# Patient Record
Sex: Female | Born: 1945 | Race: White | Hispanic: No | Marital: Married | Smoking: Never smoker
Health system: Southern US, Community
[De-identification: ages and names within clinical notes are randomized; demographics above are authoritative.]

## PROBLEM LIST (undated history)

## (undated) DIAGNOSIS — I509 Heart failure, unspecified: Secondary | ICD-10-CM

## (undated) DIAGNOSIS — I2510 Atherosclerotic heart disease of native coronary artery without angina pectoris: Secondary | ICD-10-CM

## (undated) DIAGNOSIS — E119 Type 2 diabetes mellitus without complications: Secondary | ICD-10-CM

## (undated) DIAGNOSIS — R011 Cardiac murmur, unspecified: Secondary | ICD-10-CM

## (undated) DIAGNOSIS — I10 Essential (primary) hypertension: Secondary | ICD-10-CM

## (undated) DIAGNOSIS — I499 Cardiac arrhythmia, unspecified: Secondary | ICD-10-CM

## (undated) DIAGNOSIS — J449 Chronic obstructive pulmonary disease, unspecified: Secondary | ICD-10-CM

## (undated) DIAGNOSIS — M1990 Unspecified osteoarthritis, unspecified site: Secondary | ICD-10-CM

## (undated) DIAGNOSIS — E785 Hyperlipidemia, unspecified: Secondary | ICD-10-CM

## (undated) DIAGNOSIS — J189 Pneumonia, unspecified organism: Secondary | ICD-10-CM

## (undated) DIAGNOSIS — I5189 Other ill-defined heart diseases: Secondary | ICD-10-CM

## (undated) HISTORY — DX: Essential (primary) hypertension: I10

## (undated) HISTORY — DX: Atherosclerotic heart disease of native coronary artery without angina pectoris: I25.10

## (undated) HISTORY — DX: Other ill-defined heart diseases: I51.89

## (undated) HISTORY — DX: Type 2 diabetes mellitus without complications: E11.9

## (undated) HISTORY — DX: Hyperlipidemia, unspecified: E78.5

---

## 2001-12-11 ENCOUNTER — Encounter: Payer: Self-pay | Admitting: Family Medicine

## 2001-12-11 ENCOUNTER — Encounter: Admission: RE | Admit: 2001-12-11 | Discharge: 2001-12-11 | Payer: Self-pay | Admitting: Family Medicine

## 2003-02-07 ENCOUNTER — Encounter: Payer: Self-pay | Admitting: Family Medicine

## 2003-02-07 ENCOUNTER — Encounter: Admission: RE | Admit: 2003-02-07 | Discharge: 2003-02-07 | Payer: Self-pay | Admitting: Family Medicine

## 2003-03-05 ENCOUNTER — Encounter (INDEPENDENT_AMBULATORY_CARE_PROVIDER_SITE_OTHER): Payer: Self-pay | Admitting: Specialist

## 2003-03-05 ENCOUNTER — Ambulatory Visit (HOSPITAL_COMMUNITY): Admission: RE | Admit: 2003-03-05 | Discharge: 2003-03-05 | Payer: Self-pay | Admitting: Gastroenterology

## 2004-10-20 ENCOUNTER — Encounter: Admission: RE | Admit: 2004-10-20 | Discharge: 2004-10-20 | Payer: Self-pay | Admitting: Family Medicine

## 2006-01-25 ENCOUNTER — Encounter: Admission: RE | Admit: 2006-01-25 | Discharge: 2006-01-25 | Payer: Self-pay | Admitting: Family Medicine

## 2007-05-22 ENCOUNTER — Encounter: Admission: RE | Admit: 2007-05-22 | Discharge: 2007-05-22 | Payer: Self-pay | Admitting: Pediatrics

## 2008-08-04 ENCOUNTER — Encounter: Admission: RE | Admit: 2008-08-04 | Discharge: 2008-08-04 | Payer: Self-pay | Admitting: Family Medicine

## 2009-08-25 ENCOUNTER — Encounter: Admission: RE | Admit: 2009-08-25 | Discharge: 2009-08-25 | Payer: Self-pay | Admitting: Family Medicine

## 2010-05-13 ENCOUNTER — Inpatient Hospital Stay (HOSPITAL_COMMUNITY): Admission: RE | Admit: 2010-05-13 | Discharge: 2010-05-18 | Payer: Self-pay | Admitting: Cardiovascular Disease

## 2010-05-14 HISTORY — PX: PERCUTANEOUS CORONARY STENT INTERVENTION (PCI-S): SHX6016

## 2010-05-17 HISTORY — PX: PERCUTANEOUS CORONARY STENT INTERVENTION (PCI-S): SHX6016

## 2010-06-03 ENCOUNTER — Encounter (HOSPITAL_COMMUNITY): Admission: RE | Admit: 2010-06-03 | Discharge: 2010-07-16 | Payer: Self-pay | Admitting: Cardiovascular Disease

## 2010-07-17 ENCOUNTER — Encounter (HOSPITAL_COMMUNITY): Admission: RE | Admit: 2010-07-17 | Discharge: 2010-08-30 | Payer: Self-pay | Admitting: Cardiovascular Disease

## 2010-08-27 ENCOUNTER — Encounter: Admission: RE | Admit: 2010-08-27 | Discharge: 2010-08-27 | Payer: Self-pay | Admitting: Family Medicine

## 2010-12-30 LAB — GLUCOSE, CAPILLARY
Glucose-Capillary: 114 mg/dL — ABNORMAL HIGH (ref 70–99)
Glucose-Capillary: 120 mg/dL — ABNORMAL HIGH (ref 70–99)
Glucose-Capillary: 103 mg/dL — ABNORMAL HIGH (ref 70–99)
Glucose-Capillary: 99 mg/dL (ref 70–99)

## 2010-12-31 LAB — BASIC METABOLIC PANEL
BUN: 12 mg/dL (ref 6–23)
CO2: 27 mEq/L (ref 19–32)
Calcium: 8.6 mg/dL (ref 8.4–10.5)
Chloride: 101 mEq/L (ref 96–112)
GFR calc Af Amer: >60 mL/min (ref >60)
GFR calc Af Amer: >60 mL/min (ref >60)
Potassium: 4.3 mEq/L (ref 3.5–5.1)
Sodium: 136 mEq/L (ref 135–145)
Sodium: 139 mEq/L (ref 135–145)

## 2010-12-31 LAB — CBC
Hemoglobin: 12.4 g/dL (ref 12.0–15.0)
MCHC: 33.2 g/dL (ref 30.0–36.0)
MCHC: 34.2 g/dL (ref 30.0–36.0)
MCV: 92.3 fL (ref 78.0–100.0)
Platelets: 192 10*3/uL (ref 150–400)
RBC: 4.09 MIL/uL (ref 3.87–5.11)
RBC: 4.3 MIL/uL (ref 3.87–5.11)
WBC: 5.8 10*3/uL (ref 4.0–10.5)
WBC: 6.4 10*3/uL (ref 4.0–10.5)

## 2010-12-31 LAB — GLUCOSE, CAPILLARY
Glucose-Capillary: 118 mg/dL — ABNORMAL HIGH (ref 70–99)
Glucose-Capillary: 130 mg/dL — ABNORMAL HIGH (ref 70–99)
Glucose-Capillary: 130 mg/dL — ABNORMAL HIGH (ref 70–99)
Glucose-Capillary: 130 mg/dL — ABNORMAL HIGH (ref 70–99)
Glucose-Capillary: 87 mg/dL (ref 70–99)
Glucose-Capillary: 101 mg/dL — ABNORMAL HIGH (ref 70–99)
Glucose-Capillary: 165 mg/dL — ABNORMAL HIGH (ref 70–99)

## 2010-12-31 LAB — PROTIME-INR: INR: 0.89 (ref 0.00–1.49)

## 2010-12-31 LAB — APTT: aPTT: 38 seconds — ABNORMAL HIGH (ref 24–37)

## 2011-01-01 LAB — CBC
HCT: 39.5 % (ref 36.0–46.0)
HCT: 42.1 % (ref 36.0–46.0)
Hemoglobin: 12.9 g/dL (ref 12.0–15.0)
Hemoglobin: 13.5 g/dL (ref 12.0–15.0)
Hemoglobin: 14.6 g/dL (ref 12.0–15.0)
MCH: 31.5 pg (ref 26.0–34.0)
MCH: 31.8 pg (ref 26.0–34.0)
MCHC: 34 g/dL (ref 30.0–36.0)
MCHC: 34.1 g/dL (ref 30.0–36.0)
MCV: 92.3 fL (ref 78.0–100.0)
MCV: 92.7 fL (ref 78.0–100.0)
Platelets: 177 10*3/uL (ref 150–400)
RBC: 4.11 MIL/uL (ref 3.87–5.11)
RBC: 4.6 MIL/uL (ref 3.87–5.11)
RDW: 13.3 % (ref 11.5–15.5)
RDW: 13.3 % (ref 11.5–15.5)
WBC: 6.7 10*3/uL (ref 4.0–10.5)

## 2011-01-01 LAB — BASIC METABOLIC PANEL
BUN: 9 mg/dL (ref 6–23)
CO2: 21 mEq/L (ref 19–32)
CO2: 27 mEq/L (ref 19–32)
CO2: 30 mEq/L (ref 19–32)
Calcium: 8.7 mg/dL (ref 8.4–10.5)
Calcium: 9.3 mg/dL (ref 8.4–10.5)
GFR calc Af Amer: >60 mL/min (ref >60)
GFR calc Af Amer: >60 mL/min (ref >60)
GFR calc non Af Amer: >60 mL/min (ref >60)
Glucose, Bld: 161 mg/dL — ABNORMAL HIGH (ref 70–99)
Glucose, Bld: 172 mg/dL — ABNORMAL HIGH (ref 70–99)
Potassium: 4 mEq/L (ref 3.5–5.1)
Potassium: 4 mEq/L (ref 3.5–5.1)
Potassium: 4.2 mEq/L (ref 3.5–5.1)
Sodium: 139 mEq/L (ref 135–145)
Sodium: 139 mEq/L (ref 135–145)
Sodium: 139 mEq/L (ref 135–145)

## 2011-01-01 LAB — CARDIAC PANEL(CRET KIN+CKTOT+MB+TROPI): Troponin I: 0.02 ng/mL (ref 0.00–0.06)

## 2011-01-01 LAB — GLUCOSE, CAPILLARY
Glucose-Capillary: 125 mg/dL — ABNORMAL HIGH (ref 70–99)
Glucose-Capillary: 127 mg/dL — ABNORMAL HIGH (ref 70–99)
Glucose-Capillary: 150 mg/dL — ABNORMAL HIGH (ref 70–99)
Glucose-Capillary: 152 mg/dL — ABNORMAL HIGH (ref 70–99)
Glucose-Capillary: 176 mg/dL — ABNORMAL HIGH (ref 70–99)
Glucose-Capillary: 189 mg/dL — ABNORMAL HIGH (ref 70–99)
Glucose-Capillary: 229 mg/dL — ABNORMAL HIGH (ref 70–99)
Glucose-Capillary: 235 mg/dL — ABNORMAL HIGH (ref 70–99)
Glucose-Capillary: 238 mg/dL — ABNORMAL HIGH (ref 70–99)

## 2011-01-01 LAB — LIPID PANEL
LDL Cholesterol: 71 mg/dL (ref 0–99)
Triglycerides: 204 mg/dL — ABNORMAL HIGH (ref <150)

## 2011-01-01 LAB — COMPREHENSIVE METABOLIC PANEL
Alkaline Phosphatase: 49 U/L (ref 39–117)
BUN: 9 mg/dL (ref 6–23)
Calcium: 9 mg/dL (ref 8.4–10.5)
Glucose, Bld: 154 mg/dL — ABNORMAL HIGH (ref 70–99)
Total Protein: 7.6 g/dL (ref 6.0–8.3)

## 2011-01-01 LAB — PROTIME-INR
INR: 0.81 (ref 0.00–1.49)
Prothrombin Time: 11.1 seconds — ABNORMAL LOW (ref 11.6–15.2)

## 2011-01-01 LAB — MRSA PCR SCREENING: MRSA by PCR: NEGATIVE

## 2011-01-01 LAB — HEMOGLOBIN A1C
Hgb A1c MFr Bld: 8.8 % — ABNORMAL HIGH (ref <5.7)
Mean Plasma Glucose: 206 mg/dL — ABNORMAL HIGH (ref <117)

## 2011-01-01 LAB — APTT: aPTT: 32 seconds (ref 24–37)

## 2011-03-04 NOTE — Op Note (Signed)
   NAMETRINA, Diana [name]                            ACCOUNT NO.:  [id number]   MEDICAL RECORD NO.:  [id number]                   PATIENT TYPE:  AMB   LOCATION:  ENDO                                 FACILITY:  [facility]   PHYSICIAN:  Petra Kuba, M.D.                 DATE OF BIRTH:  1946-07-21   DATE OF PROCEDURE:  03/05/2003  DATE OF DISCHARGE:                                 OPERATIVE REPORT   PROCEDURE PERFORMED:  Colonoscopy with biopsy.   ENDOSCOPIST:  Petra Kuba, M.D.   INDICATIONS FOR PROCEDURE:  Screening.  Consent was signed after the risks,  benefits, methods and options were thoroughly discussed in the office.   MEDICINES USED:  Demerol 70 mg, Versed 7.5 mg.   DESCRIPTION OF PROCEDURE:  Rectal inspection was pertinent for external  hemorrhoids, small.  Digital exam was negative.  [name] video pediatric  adjustable colonoscope was inserted and easily advanced around the colon to  the cecum.  This did not require any abdominal pressure or any position  changes.  The cecum was identified by the appendiceal orifice and the  ileocecal valve.  No obvious abnormality was seen on insertion.  The cecum  was identified by the appendiceal orifice and the ileocecal valve.  The  scope was slowly withdrawn.  The prep was adequate.  There was some liquid  stool that required washing and suctioning.  On slow withdrawal through the  colon, no abnormalities were seen as we slowly withdrew back to the rectum  except for in the distal sigmoid [name] tiny probable hyperplastic appearing  polyp was seen and was cold biopsied times one.  Once back  in the rectum  anorectal pullthrough and retroflexion confirmed some small hemorrhoids.  Scope was straightened, readvanced [name] short ways up the left side of the  colon.  Air was suctioned, scope removed.  The patient tolerated the  procedure well.  There was no immediate obvious complication.   ENDOSCOPIC DIAGNOSIS:  1. Internal and external hemorrhoids.  2.  Tiny distal sigmoid polyp cold biopsied.  3. Otherwise within normal limits to the cecum.   PLAN:  Await pathology but probably recheck colon screening in five years.  Happy to see back as needed.  Otherwise return care to Ms. Toni Arthurs for the  customary health care maintenance to include yearly rectals and guaiacs.                                               Petra Kuba, M.D.    MEM/MEDQ  D:  03/05/2003  Diana:  03/06/2003  Job:  [id number]   cc:   Theora Gianotti [facility]

## 2011-09-23 ENCOUNTER — Other Ambulatory Visit: Payer: Self-pay | Admitting: Family Medicine

## 2011-10-06 ENCOUNTER — Other Ambulatory Visit: Payer: Self-pay | Admitting: Family Medicine

## 2011-10-17 ENCOUNTER — Other Ambulatory Visit: Payer: Self-pay | Admitting: Family Medicine

## 2011-10-17 DIAGNOSIS — N61 Mastitis without abscess: Secondary | ICD-10-CM

## 2011-10-21 ENCOUNTER — Ambulatory Visit
Admission: RE | Admit: 2011-10-21 | Discharge: 2011-10-21 | Disposition: A | Discharge status: Home / Self Care | Payer: BC Managed Care – PPO | Source: Ambulatory Visit | Attending: Family Medicine | Admitting: Family Medicine

## 2011-10-21 DIAGNOSIS — N61 Mastitis without abscess: Secondary | ICD-10-CM

## 2012-11-09 ENCOUNTER — Other Ambulatory Visit: Payer: Self-pay | Admitting: Family Medicine

## 2012-11-09 DIAGNOSIS — Z1231 Encounter for screening mammogram for malignant neoplasm of breast: Secondary | ICD-10-CM

## 2012-12-13 ENCOUNTER — Ambulatory Visit
Admission: RE | Admit: 2012-12-13 | Discharge: 2012-12-13 | Disposition: A | Discharge status: Home / Self Care | Payer: BC Managed Care – PPO | Source: Ambulatory Visit | Attending: Family Medicine | Admitting: Family Medicine

## 2012-12-13 DIAGNOSIS — Z1231 Encounter for screening mammogram for malignant neoplasm of breast: Secondary | ICD-10-CM

## 2012-12-14 ENCOUNTER — Other Ambulatory Visit (HOSPITAL_COMMUNITY): Payer: Self-pay | Admitting: Cardiovascular Disease

## 2012-12-14 DIAGNOSIS — I119 Hypertensive heart disease without heart failure: Secondary | ICD-10-CM

## 2012-12-14 DIAGNOSIS — I2510 Atherosclerotic heart disease of native coronary artery without angina pectoris: Secondary | ICD-10-CM

## 2012-12-15 DIAGNOSIS — I5189 Other ill-defined heart diseases: Secondary | ICD-10-CM

## 2012-12-15 HISTORY — DX: Other ill-defined heart diseases: I51.89

## 2013-01-01 ENCOUNTER — Encounter (HOSPITAL_COMMUNITY): Payer: BC Managed Care – PPO

## 2013-01-01 ENCOUNTER — Ambulatory Visit (HOSPITAL_COMMUNITY)
Admission: RE | Admit: 2013-01-01 | Discharge: 2013-01-01 | Disposition: A | Discharge status: Home / Self Care | Payer: BC Managed Care – PPO | Source: Ambulatory Visit | Attending: Cardiovascular Disease | Admitting: Cardiovascular Disease

## 2013-01-01 DIAGNOSIS — I2510 Atherosclerotic heart disease of native coronary artery without angina pectoris: Secondary | ICD-10-CM | POA: Insufficient documentation

## 2013-01-01 NOTE — Progress Notes (Signed)
[facility]   2D echo completed 01/01/2013.   Cindy Jacquise Rarick, RDCS  

## 2013-05-14 ENCOUNTER — Other Ambulatory Visit: Payer: Self-pay | Admitting: *Deleted

## 2013-05-14 MED ORDER — LOSARTAN POTASSIUM-HCTZ 50-12.5 MG PO TABS: 1.0000 | ORAL_TABLET | Freq: Every day | ORAL | Status: DC

## 2013-05-14 NOTE — Telephone Encounter (Signed)
Rx was sent to pharmacy electronically. 

## 2013-05-17 ENCOUNTER — Other Ambulatory Visit: Payer: Self-pay | Admitting: Cardiovascular Disease

## 2013-05-17 LAB — COMPREHENSIVE METABOLIC PANEL
ALT: 28 U/L (ref 0–35)
AST: 14 U/L (ref 0–37)
Alkaline Phosphatase: 43 U/L (ref 39–117)
Glucose, Bld: 173 mg/dL — ABNORMAL HIGH (ref 70–99)
Sodium: 137 mEq/L (ref 135–145)
Total Bilirubin: 0.4 mg/dL (ref 0.3–1.2)
Total Protein: 7 g/dL (ref 6.0–8.3)

## 2013-05-17 LAB — NMR LIPOPROFILE WITH LIPIDS
Cholesterol, Total: 150 mg/dL (ref <200)
HDL Size: 8.6 nm — ABNORMAL LOW (ref >=9.2)
HDL-C: 44 mg/dL (ref >=40)
Large HDL-P: 3.5 umol/L — ABNORMAL LOW (ref >=4.8)
Triglycerides: 180 mg/dL — ABNORMAL HIGH (ref <150)

## 2013-05-17 LAB — HEMOGLOBIN A1C: Mean Plasma Glucose: 203 mg/dL — ABNORMAL HIGH (ref <117)

## 2013-05-22 ENCOUNTER — Encounter: Payer: Self-pay | Admitting: *Deleted

## 2013-05-24 ENCOUNTER — Ambulatory Visit (INDEPENDENT_AMBULATORY_CARE_PROVIDER_SITE_OTHER): Payer: BC Managed Care – PPO | Admitting: Cardiovascular Disease

## 2013-05-24 ENCOUNTER — Encounter: Payer: Self-pay | Admitting: Cardiovascular Disease

## 2013-05-24 VITALS — BP 150/70 | HR 73 | Ht 64.0 in | Wt 204.4 lb

## 2013-05-24 DIAGNOSIS — I2510 Atherosclerotic heart disease of native coronary artery without angina pectoris: Secondary | ICD-10-CM

## 2013-05-24 DIAGNOSIS — E119 Type 2 diabetes mellitus without complications: Secondary | ICD-10-CM

## 2013-05-24 DIAGNOSIS — I10 Essential (primary) hypertension: Secondary | ICD-10-CM | POA: Insufficient documentation

## 2013-05-24 DIAGNOSIS — E669 Obesity, unspecified: Secondary | ICD-10-CM

## 2013-05-24 MED ORDER — EZETIMIBE 10 MG PO TABS: 10.0000 mg | ORAL_TABLET | Freq: Every day | ORAL | Status: DC

## 2013-05-24 NOTE — Progress Notes (Signed)
Patient ID: Diana [name], female   DOB: [birth year], 67 y.o.   MRN: [id number]     HPI: Diana [name], is a 67 y.o. female presents to the office today for her 4 month cardiology follow up evaluation.  Diana [name] is now 67 years old. In July/August 2011 she underwent staged intervention with PTCA and stenting of a 95% ostial LAD stenosis (3.5x12 mm promiscuity a stent) postdilated 3.75 mm, end-stage intervention to) artery with tandem 3.0x28 and 2.5x23 mm promiscuity. She has a history of hyperlipidemia. While on Crestor she did develop mild transaminase elevation leading to its discontinuance. She had been given a prescription presented but never did fill this. She also has a history of diabetes mellitus which has been poorly controlled, GERD., A remote history of pneumonia. An echo Doppler study in March 2014 showed normal systolic function with grade 1 diastolic dysfunction. She did have mild aortic sclerosis with mild MR and mild LA dilation. I scheduled her for a two-year followup nuclear perfusion study but insurance I denied this. Laboratory in April 2014 showed a glucose of 169. At that time, her cholesterol was 166 LDL 81 triglycerides were 782 and hemoglobin A1c was 9.0. Recommended more aggressive control of her diabetes with her primary physician. She apparently has not had any medication adjustment as of yet. I did send her for subsequent blood work including an NMR evaluation which showed an LDL cholesterol of 70 but due to her increased small LDL particles and 1086, LDL particle number was still significantly increased at 1383. HDL was 44 triglycerides are still elevated at 180. Recent hemoglobin A1c was 87.  [name] denies any significant weight loss. At times she does note some mild shortness of breath. She denies chest pain. She presents for evaluation  No past medical history on file.  No past surgical history on file.  Allergies  Allergen Reactions  . Lisinopril     Cough  .  Sulfa Antibiotics Hives    Current Outpatient Prescriptions  Medication Sig Dispense Refill  . acarbose (PRECOSE) 50 MG tablet Take 1 tablet by mouth 2 (two) times daily.       . B Complex-C (SUPER B COMPLEX) TABS Take 1 tablet by mouth daily.      . Biotin 5000 MCG CAPS Take 1 capsule by mouth daily.      . Calcium-Magnesium-Vitamin D (CALCIUM 500 PO) Take 1 tablet by mouth daily.      . carvedilol (COREG) 12.5 MG tablet Take 1 tablet by mouth 2 (two) times daily.      . Cholecalciferol (VITAMIN D-3) 1000 UNITS CAPS Take 1 capsule by mouth daily.      . clopidogrel (PLAVIX) 75 MG tablet Take 1 tablet by mouth daily.      . Cranberry-Cholecalciferol (SUPER CRANBERRY/VITAMIN D3) 4200-500 MG-UNIT CAPS Take 1 tablet by mouth 2 (two) times daily.      . fish oil-omega-3 fatty acids 1000 MG capsule Take 1 g by mouth daily.      Marland Kitchen glimepiride (AMARYL) 2 MG tablet Take 2 mg by mouth daily before breakfast.      . LANTUS SOLOSTAR 100 UNIT/ML SOPN Inject 28 mLs into the skin daily.      Marland Kitchen losartan-hydrochlorothiazide (HYZAAR) 50-12.5 MG per tablet Take 1 tablet by mouth daily.  30 tablet  11  . metFORMIN (GLUCOPHAGE) 1000 MG tablet Take 1 tablet by mouth 2 (two) times daily.      Marland Kitchen OVER THE COUNTER MEDICATION Take 6.25  mg by mouth daily. Tri-Iodine      . pantoprazole (PROTONIX) 40 MG tablet Take 1 tablet by mouth daily.       No current facility-administered medications for this visit.    History   Social History  . Marital Status: Married    Spouse Name: N/A    Number of Children: N/A  . Years of Education: N/A   Occupational History  . Not on file.   Social History Main Topics  . Smoking status: Never Smoker   . Smokeless tobacco: Never Used  . Alcohol Use: Yes     Comment: Occasionally-very rare  . Drug Use: Not on file  . Sexually Active: Not on file   Other Topics Concern  . Not on file   Social History Narrative  . No narrative on file      ROS is negative for  fevers, chills or night sweats. There is no weight loss. She denies palpitations of the there is a mild shortness of breath. She denies chest pressure. She denies bleeding. She denies recent myalgias. She denies arthralgias. There is no significant edema.   Other system review is negative.  PE BP 150/70  Pulse 73  Ht 5\' 4"  (1.626 m)  Wt 204 lb 6.4 oz (92.715 kg)  BMI 35.07 kg/m2  General: Alert, oriented, no distress.  Skin: normal turgor, no rashes HEENT: Normocephalic, atraumatic. Pupils round and reactive; sclera anicteric;no lid lag.  Nose without nasal septal hypertrophy Mouth/Parynx benign; Mallinpatti scale 3 Neck: No JVD, no carotid briuts Lungs: clear to ausculatation and percussion; no wheezing or rales Heart: RRR, s1 s2 normal 1/6 systolic murmur Abdomen: soft, nontender; no hepatosplenomehaly, BS+; abdominal aorta nontender and not dilated by palpation. Pulses 2+ Extremities: no clubbing cyanosis or edema, Homan's sign negative  Neurologic: grossly nonfocal  ECG: Sinus with more pronounced T wave inversion V1 through V3  LABS:  BMET    Component Value Date/Time   NA 137 05/17/2013 0921   K 4.3 05/17/2013 0921   CL 101 05/17/2013 0921   CO2 28 05/17/2013 0921   GLUCOSE 173* 05/17/2013 0921   BUN 15 05/17/2013 0921   CREATININE 0.77 05/17/2013 0921   CREATININE 0.74 05/18/2010 0550   CALCIUM 9.1 05/17/2013 0921   GFRNONAA >60 05/18/2010 0550   GFRAA  Value: >60        The eGFR has been calculated using the MDRD equation. This calculation has not been validated in all clinical situations. eGFR's persistently <60 mL/min signify possible Chronic Kidney Disease. 05/18/2010 0550     Hepatic Function Panel     Component Value Date/Time   PROT 7.0 05/17/2013 0921   ALBUMIN 4.3 05/17/2013 0921   AST 14 05/17/2013 0921   ALT 28 05/17/2013 0921   ALKPHOS 43 05/17/2013 0921   BILITOT 0.4 05/17/2013 0921     CBC    Component Value Date/Time   WBC 6.4 05/18/2010 0550   RBC 4.09 05/18/2010 0550    HGB 12.4 05/18/2010 0550   HCT 37.4 05/18/2010 0550   PLT 194 05/18/2010 0550   MCV 91.4 05/18/2010 0550   MCH 30.3 05/18/2010 0550   MCHC 33.2 05/18/2010 0550   RDW 12.9 05/18/2010 0550     BNP No results found for this basename: probnp    Lipid Panel     Component Value Date/Time   CHOL  Value: 154        ATP III CLASSIFICATION:  <200     mg/dL  Desirable  200-239  mg/dL   Borderline High  >=409    mg/dL   High        05/27/9146 0440   TRIG 180* 05/17/2013 0921   TRIG 204* 05/14/2010 0440   HDL 42 05/14/2010 0440   CHOLHDL 3.7 05/14/2010 0440   VLDL 41* 05/14/2010 0440   LDLCALC 70 05/17/2013 0921   LDLCALC  Value: 71        Total Cholesterol/HDL:CHD Risk Coronary Heart Disease Risk Table                     Men   Women  1/2 Average Risk   3.4   3.3  Average Risk       5.0   4.4  2 X Average Risk   9.6   7.1  3 X Average Risk  23.4   11.0        Use the calculated Patient Ratio above and the CHD Risk Table to determine the patient's CHD Risk.        ATP III CLASSIFICATION (LDL):  <100     mg/dL   Optimal  829-562  mg/dL   Near or Above                    Optimal  130-159  mg/dL   Borderline  130-865  mg/dL   High  >784     mg/dL   Very High 6/96/2952 8413     RADIOLOGY: No results found.    ASSESSMENT AND PLAN: Diana [name] is now 67 years old and is 3 years status post two-vessel intervention including a 95% ostial LAD stenosis with PTCA stenting and tandem stenting to her RCA.Marland Kitchen She has poorly controlled diabetes mellitus and continues to have significant hyperlipidemia as assessed by LDL particle number. Remotely she did develop mild transaminase elevations on Crestor. I am suggesting she start Zetia 10 mg. In light of her EKG changes. I am recommending that she undergo a exercise Myoview scan over the next several weeks. She does note some mild shortness of breath. She does she will require additional weight loss and improve diabetic management. She tells me she will be following up with her primary  physician Dr. Clarene Duke. I will see her in for 6 weeks followup evaluation.     Lennette Bihari, MD, [facility]  05/24/2013 10:13 AM

## 2013-05-24 NOTE — Patient Instructions (Signed)
Your physician has requested that you have a lexiscan myoview. For further information please visit [email or web address] Please follow instruction sheet, as given. This will be scheduled in the next 3-4 weeks.  Your physician has recommended you make the following change in your medication: START ZETIA 10 MG. This has already been sent to your pharmacy.  Your physician recommends that you schedule a follow-up appointment in: 6 WEEKS.

## 2013-06-14 ENCOUNTER — Ambulatory Visit (HOSPITAL_COMMUNITY)
Admission: RE | Admit: 2013-06-14 | Discharge: 2013-06-14 | Disposition: A | Discharge status: Home / Self Care | Payer: BC Managed Care – PPO | Source: Ambulatory Visit | Attending: Cardiovascular Disease | Admitting: Cardiovascular Disease

## 2013-06-14 DIAGNOSIS — I10 Essential (primary) hypertension: Secondary | ICD-10-CM | POA: Insufficient documentation

## 2013-06-14 DIAGNOSIS — E669 Obesity, unspecified: Secondary | ICD-10-CM | POA: Insufficient documentation

## 2013-06-14 DIAGNOSIS — R9431 Abnormal electrocardiogram [ECG] [EKG]: Secondary | ICD-10-CM | POA: Insufficient documentation

## 2013-06-14 DIAGNOSIS — I2510 Atherosclerotic heart disease of native coronary artery without angina pectoris: Secondary | ICD-10-CM

## 2013-06-14 DIAGNOSIS — Z794 Long term (current) use of insulin: Secondary | ICD-10-CM | POA: Insufficient documentation

## 2013-06-14 DIAGNOSIS — R0602 Shortness of breath: Secondary | ICD-10-CM | POA: Insufficient documentation

## 2013-06-14 DIAGNOSIS — E119 Type 2 diabetes mellitus without complications: Secondary | ICD-10-CM | POA: Insufficient documentation

## 2013-06-14 DIAGNOSIS — Z8249 Family history of ischemic heart disease and other diseases of the circulatory system: Secondary | ICD-10-CM | POA: Insufficient documentation

## 2013-06-14 MED ORDER — TECHNETIUM TC 99M SESTAMIBI GENERIC - CARDIOLITE
10.5000 | Freq: Once | INTRAVENOUS | Status: AC | PRN
  Administered 2013-06-14: 11 via INTRAVENOUS

## 2013-06-14 MED ORDER — REGADENOSON 0.4 MG/5ML IV SOLN
0.4000 mg | Freq: Once | INTRAVENOUS | Status: AC
  Administered 2013-06-14: 0.4 mg via INTRAVENOUS

## 2013-06-14 MED ORDER — TECHNETIUM TC 99M SESTAMIBI GENERIC - CARDIOLITE
31.4000 | Freq: Once | INTRAVENOUS | Status: AC | PRN
  Administered 2013-06-14: 31 via INTRAVENOUS

## 2013-06-14 NOTE — Procedures (Addendum)
[facility] [address] [address] [place] [address] [phone]  Cardiology Nuclear Med Study  Diana [name] is a 67 y.o. female     MRN : [id number]     DOB: 1945/12/14  Procedure Date: 06/14/2013  Nuclear Med Background Indication for Stress Test:  Stent Patency and Abnormal EKG History:  CAD;STENT/PTCA Cardiac Risk Factors: Family History - CAD, Hypertension, IDDM Type 2, Lipids and Obesity  Symptoms:  SOB   Nuclear Pre-Procedure Caffeine/Decaff Intake:  7:00pm NPO After: 5:00am   IV Site: R Hand  IV 0.9% NS with Angio Cath:  22g  Chest Size (in):  N/A IV Started by: Emmit Pomfret, RN  Height: 5\' 4"  (1.626 m)  Cup Size: C  BMI:  Body mass index is 35 kg/(m^2). Weight:  204 lb (92.534 kg)   Tech Comments:  N/A    Nuclear Med Study 1 or 2 day study: 1 day  Stress Test Type:  Lexiscan  Order Authorizing Provider:  Nicki Guadalajara, MD   Resting Radionuclide: Technetium 45m Sestamibi  Resting Radionuclide Dose: 10.5 mCi   Stress Radionuclide:  Technetium 24m Sestamibi  Stress Radionuclide Dose: 31.4 mCi           Stress Protocol Rest HR: 76 Stress HR: 102  Rest BP: 153/77 Stress BP: 167/73  Exercise Time (min): n/a METS: n/a          Dose of Adenosine (mg):  n/a Dose of Lexiscan: 0.4 mg  Dose of Atropine (mg): n/a Dose of Dobutamine: n/a mcg/kg/min (at max HR)  Stress Test Technologist: Ernestene Mention, CCT Nuclear Technologist: Gonzella Lex, CNMT   Rest Procedure:  Myocardial perfusion imaging was performed at rest 45 minutes following the intravenous administration of Technetium 8m Sestamibi. Stress Procedure:  The patient received IV Lexiscan 0.4 mg over 15-seconds.  Technetium 27m Sestamibi injected at 30-seconds.  There were no significant changes with Lexiscan.  Quantitative spect images were obtained after a 45 minute delay.  Transient Ischemic Dilatation (Normal <1.22):  0.90 Lung/Heart Ratio (Normal <0.45):   0.28 QGS EDV:  85 ml QGS ESV:  25 ml LV Ejection Fraction: 70%  Signed by    Rest ECG: NSR with non-specific T wave changes  Stress ECG: No significant change from baseline ECG  QPS Raw Data Images:  Normal; no motion artifact; normal heart/lung ratio. Stress Images:  Normal homogeneous uptake in all areas of the myocardium. Rest Images:  Normal homogeneous uptake in all areas of the myocardium. Subtraction (SDS):  Normal  Impression Exercise Capacity:  Lexiscan with no exercise. BP Response:  Normal blood pressure response. Clinical Symptoms:  No significant symptoms noted. ECG Impression:  No significant ST segment change suggestive of ischemia. Comparison with Prior Nuclear Study: No significant change from previous study  Overall Impression:  Normal stress nuclear study.  LV Wall Motion:  NL LV Function, EF 70%; NL Wall Motion   KELLY,THOMAS A, MD  06/14/2013 1:16 PM

## 2013-06-19 ENCOUNTER — Encounter: Payer: Self-pay | Admitting: *Deleted

## 2013-06-19 NOTE — Progress Notes (Signed)
Quick Note:  Note sent to patient ______ 

## 2013-06-25 ENCOUNTER — Telehealth: Payer: Self-pay | Admitting: Cardiovascular Disease

## 2013-06-25 NOTE — Telephone Encounter (Signed)
Would like some samples of Zetia 10 mg please. °

## 2013-06-26 MED ORDER — EZETIMIBE 10 MG PO TABS: 10.0000 mg | ORAL_TABLET | Freq: Every day | ORAL | Status: DC

## 2013-06-26 NOTE — Telephone Encounter (Signed)
Returned call and left message samples left at front desk.  Call back before 4pm if questions.

## 2013-06-29 ENCOUNTER — Encounter: Payer: Self-pay | Admitting: *Deleted

## 2013-07-05 ENCOUNTER — Encounter: Payer: Self-pay | Admitting: Cardiology

## 2013-07-05 DIAGNOSIS — E119 Type 2 diabetes mellitus without complications: Secondary | ICD-10-CM

## 2013-07-05 DIAGNOSIS — E785 Hyperlipidemia, unspecified: Secondary | ICD-10-CM

## 2013-07-05 DIAGNOSIS — I5189 Other ill-defined heart diseases: Secondary | ICD-10-CM

## 2013-07-08 ENCOUNTER — Encounter: Payer: Self-pay | Admitting: Cardiovascular Disease

## 2013-07-08 ENCOUNTER — Ambulatory Visit: Payer: BC Managed Care – PPO | Admitting: Cardiovascular Disease

## 2013-07-09 ENCOUNTER — Encounter: Payer: Self-pay | Admitting: Cardiovascular Disease

## 2013-07-09 ENCOUNTER — Ambulatory Visit (INDEPENDENT_AMBULATORY_CARE_PROVIDER_SITE_OTHER): Payer: BC Managed Care – PPO | Admitting: Cardiovascular Disease

## 2013-07-09 VITALS — BP 118/60 | HR 72 | Ht 64.0 in | Wt 199.4 lb

## 2013-07-09 DIAGNOSIS — E669 Obesity, unspecified: Secondary | ICD-10-CM

## 2013-07-09 DIAGNOSIS — I5189 Other ill-defined heart diseases: Secondary | ICD-10-CM

## 2013-07-09 DIAGNOSIS — Z79899 Other long term (current) drug therapy: Secondary | ICD-10-CM

## 2013-07-09 DIAGNOSIS — E785 Hyperlipidemia, unspecified: Secondary | ICD-10-CM

## 2013-07-09 DIAGNOSIS — E119 Type 2 diabetes mellitus without complications: Secondary | ICD-10-CM

## 2013-07-09 DIAGNOSIS — I10 Essential (primary) hypertension: Secondary | ICD-10-CM

## 2013-07-09 DIAGNOSIS — E782 Mixed hyperlipidemia: Secondary | ICD-10-CM

## 2013-07-09 DIAGNOSIS — I519 Heart disease, unspecified: Secondary | ICD-10-CM

## 2013-07-09 DIAGNOSIS — I2510 Atherosclerotic heart disease of native coronary artery without angina pectoris: Secondary | ICD-10-CM

## 2013-07-09 MED ORDER — EZETIMIBE 10 MG PO TABS: 10.0000 mg | ORAL_TABLET | Freq: Every day | ORAL | Status: DC

## 2013-07-09 NOTE — Progress Notes (Signed)
Patient ID: Diana [name], female   DOB: 03/21/1946, 67 y.o.   MRN: [id number]     HPI: Diana [name], is a 67 y.o. female presents to the office today for cardiology follow up evaluation.  [name] is 67 years old  WFI who has established coronary artery disease. In July/August 2011 she underwent staged intervention with PTCA and stenting of a 95% ostial LAD stenosis (3.5x12 mm promiscuity a stent) postdilated 3.75 mm, end-stage intervention to) artery with tandem 3.0x28 and 2.5x23 mm promiscuity. She has a history of hyperlipidemia. In January 2012 while on Crestor she did develop mild transaminase elevation leading to its discontinuance.  She also has a history of diabetes mellitus which has been poorly controlled, GERD,  remote history of pneumonia. An echo Doppler study in March 2014 showed normal systolic function with grade 1 diastolic dysfunction. She did have mild aortic sclerosis with mild MR and mild LA dilation. I scheduled her for a two-year followup nuclear perfusion study but insurance I denied this. Laboratory in April 2014 showed a glucose of 169. At that time, her cholesterol was 166 LDL 81 triglycerides were 130 and hemoglobin A1c was 9.0. She tells me that her primary physician is now increased her insulin. I did send her for subsequent blood work including an NMR evaluation which showed an LDL cholesterol of 70 but due to her increased small LDL particles and 1086, LDL particle number was still significantly increased at 1383. HDL was 44 triglycerides are still elevated at 180. Recent hemoglobin A1c was 87. When I saw her at her last office visit I recommended that she start taking Zetia 10 mg. She seems to tolerate this. She has not had subsequent blood work done as of yet.  [name] underwent a nuclear perfusion study on 06/04/2013 per this continued to show normal perfusion and function. Ejection fraction was 70%. There was no evidence for scar or ischemia.  Since I last saw  her, she has been trying to lose weight and has lost approximately 5 pounds. At times she does note some mild shortness of breath. She denies chest pain. She presents for evaluation  Past Medical History  Diagnosis Date  . CAD (coronary artery disease) 7/12-8/12    staged LAD/RCA DES  . Dyslipidemia   . Diabetes mellitus   . HTN (hypertension)   . Diastolic dysfunction 3/14    grade 1     Past Surgical History  Procedure Laterality Date  . Percutaneous coronary stent intervention (pci-s)  05/14/10    LAD  . Percutaneous coronary stent intervention (pci-s)  05/17/10    RCA    Allergies  Allergen Reactions  . Lisinopril     Cough  . Sulfa Antibiotics Hives    Current Outpatient Prescriptions  Medication Sig Dispense Refill  . acarbose (PRECOSE) 50 MG tablet Take 1 tablet by mouth 2 (two) times daily.       . B Complex-C (SUPER B COMPLEX) TABS Take 1 tablet by mouth daily.      . Biotin 5000 MCG CAPS Take 1 capsule by mouth daily.      . Calcium-Magnesium-Vitamin D (CALCIUM 500 PO) Take 1 tablet by mouth daily.      . carvedilol (COREG) 12.5 MG tablet Take 1 tablet by mouth 2 (two) times daily.      . Cholecalciferol (VITAMIN D-3) 1000 UNITS CAPS Take 1 capsule by mouth daily.      . clopidogrel (PLAVIX) 75 MG tablet Take 1 tablet by  mouth daily.      . Cranberry-Cholecalciferol (SUPER CRANBERRY/VITAMIN D3) 4200-500 MG-UNIT CAPS Take 1 tablet by mouth 2 (two) times daily.      Marland Kitchen ezetimibe (ZETIA) 10 MG tablet Take 1 tablet (10 mg total) by mouth daily.  28 tablet  0  . fish oil-omega-3 fatty acids 1000 MG capsule Take 1 g by mouth daily.      Marland Kitchen glimepiride (AMARYL) 2 MG tablet Take 2 mg by mouth daily before breakfast.      . LANTUS SOLOSTAR 100 UNIT/ML SOPN Inject 45 mLs into the skin daily.       Marland Kitchen losartan-hydrochlorothiazide (HYZAAR) 50-12.5 MG per tablet Take 1 tablet by mouth daily.  30 tablet  11  . metFORMIN (GLUCOPHAGE) 1000 MG tablet Take 1 tablet by mouth 2 (two) times  daily.      Marland Kitchen OVER THE COUNTER MEDICATION Take 6.25 mg by mouth daily. Tri-Iodine      . pantoprazole (PROTONIX) 40 MG tablet Take 1 tablet by mouth daily.       No current facility-administered medications for this visit.    History   Social History  . Marital Status: Married    Spouse Name: N/A    Number of Children: N/A  . Years of Education: N/A   Occupational History  . Not on file.   Social History Main Topics  . Smoking status: Never Smoker   . Smokeless tobacco: Never Used  . Alcohol Use: Yes     Comment: Occasionally-very rare  . Drug Use: Not on file  . Sexual Activity: Not on file   Other Topics Concern  . Not on file   Social History Narrative  . No narrative on file      ROS is negative for fevers, chills or night sweats. There is a 5 pound weight loss to She denies palpitations of the there is a mild shortness of breath. She denies chest pressure. She denies bleeding. She denies recent myalgias. She denies arthralgias. There is no significant edema.   She denies rash. She denies tremors. She states her blood sugar has improved on the increased insulin dose. Other system review is negative.  PE BP 118/60  Pulse 72  Ht 5\' 4"  (1.626 m)  Wt 199 lb 6.4 oz (90.447 kg)  BMI 34.21 kg/m2  General: Alert, oriented, no distress.  Skin: normal turgor, no rashes HEENT: Normocephalic, atraumatic. Pupils round and reactive; sclera anicteric;no lid lag.  Nose without nasal septal hypertrophy Mouth/Parynx benign; Mallinpatti scale 3 Neck: No JVD, no carotid briuts Lungs: clear to ausculatation and percussion; no wheezing or rales Heart: RRR, s1 s2 normal 1/6 systolic murmur Abdomen: Moderate central adiposity soft, nontender; no hepatosplenomehaly, BS+; abdominal aorta nontender and not dilated by palpation. Pulses 2+ Extremities: no clubbing cyanosis or edema, Homan's sign negative  Neurologic: grossly nonfocal Psychologic: Normal affect and  mood     LABS:  BMET    Component Value Date/Time   NA 137 05/17/2013 0921   K 4.3 05/17/2013 0921   CL 101 05/17/2013 0921   CO2 28 05/17/2013 0921   GLUCOSE 173* 05/17/2013 0921   BUN 15 05/17/2013 0921   CREATININE 0.77 05/17/2013 0921   CREATININE 0.74 05/18/2010 0550   CALCIUM 9.1 05/17/2013 0921   GFRNONAA >60 05/18/2010 0550   GFRAA  Value: >60        The eGFR has been calculated using the MDRD equation. This calculation has not been validated in all clinical situations.  eGFR's persistently <60 mL/min signify possible Chronic Kidney Disease. 05/18/2010 0550     Hepatic Function Panel     Component Value Date/Time   PROT 7.0 05/17/2013 0921   ALBUMIN 4.3 05/17/2013 0921   AST 14 05/17/2013 0921   ALT 28 05/17/2013 0921   ALKPHOS 43 05/17/2013 0921   BILITOT 0.4 05/17/2013 0921     CBC    Component Value Date/Time   WBC 6.4 05/18/2010 0550   RBC 4.09 05/18/2010 0550   HGB 12.4 05/18/2010 0550   HCT 37.4 05/18/2010 0550   PLT 194 05/18/2010 0550   MCV 91.4 05/18/2010 0550   MCH 30.3 05/18/2010 0550   MCHC 33.2 05/18/2010 0550   RDW 12.9 05/18/2010 0550     BNP No results found for this basename: probnp    Lipid Panel     Component Value Date/Time   CHOL  Value: 154        ATP III CLASSIFICATION:  <200     mg/dL   Desirable  409-811  mg/dL   Borderline High  >=914    mg/dL   High        7/82/9562 0440   TRIG 180* 05/17/2013 0921   TRIG 204* 05/14/2010 0440   HDL 42 05/14/2010 0440   CHOLHDL 3.7 05/14/2010 0440   VLDL 41* 05/14/2010 0440   LDLCALC 70 05/17/2013 0921   LDLCALC  Value: 71        Total Cholesterol/HDL:CHD Risk Coronary Heart Disease Risk Table                     Men   Women  1/2 Average Risk   3.4   3.3  Average Risk       5.0   4.4  2 X Average Risk   9.6   7.1  3 X Average Risk  23.4   11.0        Use the calculated Patient Ratio above and the CHD Risk Table to determine the patient's CHD Risk.        ATP III CLASSIFICATION (LDL):  <100     mg/dL   Optimal  130-865  mg/dL   Near or Above                     Optimal  130-159  mg/dL   Borderline  784-696  mg/dL   High  >295     mg/dL   Very High 2/84/1324 4010     RADIOLOGY: No results found.    ASSESSMENT AND PLAN: [name] is now 67 years old and is 3 years status post two-vessel intervention including a 95% ostial LAD stenosis with PTCA stenting and tandem stenting to her RCA..  her recent nuclear perfusion study continues to show normal perfusion and argues against restenosis. She has now been on steady for her increased LDL particle number at 1383 and appears to be tolerating this without subjective side effects. At the time her calculated LDL was 70. I am recommending that she undergo a 3 month followup NMR lipoprofile in 6 weeks at which time a compressive metabolic panel will also be obtained. We did discuss the importance of continued weight loss. I commended her on her 5 pound weight reduction but clearly significant additional weight loss is necessary.    Lennette Bihari, MD, [facility]  07/09/2013 10:24 AM

## 2013-07-09 NOTE — Patient Instructions (Addendum)
Your physician recommends that you return for lab work in: November  Your physician recommends that you schedule a follow-up appointment in: 6 months

## 2013-10-31 ENCOUNTER — Other Ambulatory Visit: Payer: Self-pay | Admitting: *Deleted

## 2013-10-31 MED ORDER — CLOPIDOGREL BISULFATE 75 MG PO TABS: 75.0000 mg | ORAL_TABLET | Freq: Every day | ORAL | Status: DC

## 2013-10-31 NOTE — Telephone Encounter (Signed)
Rx was sent to pharmacy electronically. 

## 2013-11-07 ENCOUNTER — Other Ambulatory Visit: Payer: Self-pay

## 2013-11-07 DIAGNOSIS — Z1231 Encounter for screening mammogram for malignant neoplasm of breast: Secondary | ICD-10-CM

## 2013-12-16 ENCOUNTER — Ambulatory Visit
Admission: RE | Admit: 2013-12-16 | Discharge: 2013-12-16 | Disposition: A | Discharge status: Home / Self Care | Payer: Self-pay | Source: Ambulatory Visit

## 2013-12-16 DIAGNOSIS — Z1231 Encounter for screening mammogram for malignant neoplasm of breast: Secondary | ICD-10-CM

## 2013-12-19 ENCOUNTER — Telehealth: Payer: Self-pay | Admitting: *Deleted

## 2013-12-23 ENCOUNTER — Ambulatory Visit: Payer: BC Managed Care – PPO | Admitting: Cardiovascular Disease

## 2014-01-14 ENCOUNTER — Ambulatory Visit: Payer: BC Managed Care – PPO | Admitting: Cardiovascular Disease

## 2014-01-17 ENCOUNTER — Ambulatory Visit: Payer: Medicare HMO | Admitting: Cardiovascular Disease

## 2014-01-28 ENCOUNTER — Other Ambulatory Visit: Payer: Self-pay | Admitting: *Deleted

## 2014-01-28 MED ORDER — CARVEDILOL 12.5 MG PO TABS: 12.5000 mg | ORAL_TABLET | Freq: Two times a day (BID) | ORAL | Status: DC

## 2014-01-28 NOTE — Telephone Encounter (Signed)
Rx refill sent to patients pharmacy  

## 2014-02-04 ENCOUNTER — Other Ambulatory Visit: Payer: Self-pay | Admitting: Cardiovascular Disease

## 2014-02-04 NOTE — Telephone Encounter (Signed)
Rx was sent to pharmacy electronically. 

## 2014-02-07 ENCOUNTER — Other Ambulatory Visit: Payer: Self-pay | Admitting: Cardiovascular Disease

## 2014-02-07 LAB — COMPREHENSIVE METABOLIC PANEL
ALK PHOS: 41 U/L (ref 39–117)
ALT: 35 U/L (ref 0–35)
AST: 22 U/L (ref 0–37)
Albumin: 4.3 g/dL (ref 3.5–5.2)
BILIRUBIN TOTAL: 0.4 mg/dL (ref 0.2–1.2)
BUN: 16 mg/dL (ref 6–23)
CO2: 29 mEq/L (ref 19–32)
Calcium: 9.2 mg/dL (ref 8.4–10.5)
Chloride: 99 mEq/L (ref 96–112)
Creat: 0.75 mg/dL (ref 0.50–1.10)
Glucose, Bld: 178 mg/dL — ABNORMAL HIGH (ref 70–99)
POTASSIUM: 4.2 meq/L (ref 3.5–5.3)
Sodium: 138 mEq/L (ref 135–145)
TOTAL PROTEIN: 6.9 g/dL (ref 6.0–8.3)

## 2014-02-07 LAB — NMR LIPOPROFILE WITH LIPIDS
CHOLESTEROL, TOTAL: 122 mg/dL (ref <200)
HDL PARTICLE NUMBER: 41 umol/L (ref >=30.5)
HDL Size: 8.7 nm — ABNORMAL LOW (ref >=9.2)
HDL-C: 46 mg/dL (ref >=40)
LARGE HDL: 3.2 umol/L — AB (ref >=4.8)
LARGE VLDL-P: 11.7 nmol/L — AB (ref <=2.7)
LDL CALC: 44 mg/dL (ref <100)
LDL Particle Number: 922 nmol/L (ref <1000)
LDL Size: 19.7 nm — ABNORMAL LOW (ref >20.5)
LP-IR Score: 82 — ABNORMAL HIGH (ref <=45)
SMALL LDL PARTICLE NUMBER: 758 nmol/L — AB (ref <=527)
Triglycerides: 160 mg/dL — ABNORMAL HIGH (ref <150)
VLDL Size: 58.1 nm — ABNORMAL HIGH (ref <=46.6)

## 2014-02-17 NOTE — Telephone Encounter (Signed)
Encounter Closed---02/17/14 TP 

## 2014-02-18 ENCOUNTER — Ambulatory Visit (INDEPENDENT_AMBULATORY_CARE_PROVIDER_SITE_OTHER): Payer: Medicare HMO | Admitting: Cardiovascular Disease

## 2014-02-18 VITALS — BP 130/72 | HR 74 | Ht 64.5 in | Wt 208.2 lb

## 2014-02-18 DIAGNOSIS — E119 Type 2 diabetes mellitus without complications: Secondary | ICD-10-CM

## 2014-02-18 DIAGNOSIS — I5189 Other ill-defined heart diseases: Secondary | ICD-10-CM

## 2014-02-18 DIAGNOSIS — E669 Obesity, unspecified: Secondary | ICD-10-CM

## 2014-02-18 DIAGNOSIS — I519 Heart disease, unspecified: Secondary | ICD-10-CM

## 2014-02-18 DIAGNOSIS — E785 Hyperlipidemia, unspecified: Secondary | ICD-10-CM

## 2014-02-18 DIAGNOSIS — I2510 Atherosclerotic heart disease of native coronary artery without angina pectoris: Secondary | ICD-10-CM

## 2014-02-18 DIAGNOSIS — I10 Essential (primary) hypertension: Secondary | ICD-10-CM

## 2014-02-18 NOTE — Patient Instructions (Signed)
Your physician recommends that you schedule a follow-up appointment in: 1 year  

## 2014-03-01 ENCOUNTER — Encounter: Payer: Self-pay | Admitting: Cardiovascular Disease

## 2014-03-01 NOTE — Progress Notes (Signed)
Patient ID: Diana [name], female   DOB: 1945-12-15, 68 y.o.   MRN: [id number]      HPI: Diana [name] is a 68 y.o. female presents to the office today for an 8 month cardiology follow up evaluation.  Diana [name] has established coronary artery disease and in July/August 2011 she underwent staged intervention with PTCA and stenting of a 95% ostial LAD stenosis (3.5x12 mm promiscuity a stent) postdilated 3.75 mm, end-stage intervention to) artery with tandem 3.0x28 and 2.5x23 mm promiscuity. She has a history of hyperlipidemia. In January 2012 while on Crestor she developed mild transaminase elevation leading to its discontinuance.  She has a history of diabetes mellitus which has been poorly controlled, GERD,  remote history of pneumonia. An echo Doppler study in March 2014 showed normal systolic function with grade 1 diastolic dysfunction. She did have mild aortic sclerosis with mild MR and mild LA dilation. I scheduled her for a two-year followup nuclear perfusion study but insurance I denied this. Laboratory in April 2014 showed a glucose of 169. At that time, her cholesterol was 166 LDL 81 triglycerides were 192 and hemoglobin A1c was 9.0. Her primary physician is increased her insulin. I did send her for subsequent blood work including an NMR evaluation which showed an LDL cholesterol of 70 but due to her increased small LDL particles and 1086, LDL particle number was still significantly increased at 1383. HDL was 44 triglycerides are still elevated at 180. Recent hemoglobin A1c was 87. When I saw her at her last office visit I recommended that she start taking Zetia 10 mg. She seems to tolerate this. She has not had subsequent blood work done as of yet.  Diana [name] underwent a nuclear perfusion study on 06/04/2013 which continued to show normal perfusion and function. Ejection fraction was 70%. There was no evidence for scar or ischemia.  Diana [name], she denies recurrent episodes of chest pain.   She has been taking carvedilol 12.5 mg and losartan, HCT 50/12.5 mg for blood pressure.  She is on Zetia 10 mg in addition to fish oil for hyperlipidemia.  She is on Amaryl 2 mg and metformin 1000 g twice a day for diabetes mellitus.  She continues to be on dual antiplatelet therapy.  She tells me recently Dr. Abbie Sons had checked her hemoglobin A1c and this was elevated at 8.  Additional blood work done on 02/07/2014 showed a glucose of 178.  NMR LipoProfile was excellent with an LDL particle number of 922, calculated LDL of 44, HDL, was 46, and total cholesterol 122.  However, triglycerides are still elevated at 160.  Her VLDL size was increased at 58.1.  She still had increased small LDL particles.  At 758.  Insulin resistance score 82.  She denies chest pain.  She denies palpitations.  There is no presyncope or syncope.  Past Medical History  Diagnosis Date  . CAD (coronary artery disease) 7/12-8/12    staged LAD/RCA DES  . Dyslipidemia   . Diabetes mellitus   . HTN (hypertension)   . Diastolic dysfunction 7/82    grade 1     Past Surgical History  Procedure Laterality Date  . Percutaneous coronary stent intervention (pci-s)  05/14/10    LAD  . Percutaneous coronary stent intervention (pci-s)  05/17/10    RCA    Allergies  Allergen Reactions  . Lisinopril     Cough  . Sulfa Antibiotics Hives    Current Outpatient Prescriptions  Medication Sig Dispense Refill  .  acarbose (PRECOSE) 50 MG tablet Take 1 tablet by mouth 2 (two) times daily.       . B Complex-C (SUPER B COMPLEX) TABS Take 1 tablet by mouth daily.      . Biotin 5000 MCG CAPS Take 1 capsule by mouth daily.      . Calcium-Magnesium-Vitamin D (CALCIUM 500 PO) Take 1 tablet by mouth daily.      . carvedilol (COREG) 12.5 MG tablet Take 1 tablet (12.5 mg total) by mouth 2 (two) times daily.  60 tablet  5  . Cholecalciferol (VITAMIN D-3) 1000 UNITS CAPS Take 1 capsule by mouth daily.      . clopidogrel (PLAVIX) 75 MG tablet  Take 1 tablet (75 mg total) by mouth daily.  30 tablet  8  . Cranberry-Cholecalciferol (SUPER CRANBERRY/VITAMIN D3) 4200-500 MG-UNIT CAPS Take 1 tablet by mouth 2 (two) times daily.      . fish oil-omega-3 fatty acids 1000 MG capsule Take 1 g by mouth daily.      Marland Kitchen glimepiride (AMARYL) 2 MG tablet Take 2 mg by mouth daily before breakfast.      . LANTUS SOLOSTAR 100 UNIT/ML SOPN Inject 50 mLs into the skin daily.       Marland Kitchen losartan-hydrochlorothiazide (HYZAAR) 50-12.5 MG per tablet Take 1 tablet by mouth daily.  30 tablet  11  . metFORMIN (GLUCOPHAGE) 1000 MG tablet Take 1 tablet by mouth 2 (two) times daily.      . pantoprazole (PROTONIX) 40 MG tablet Take 1 tablet by mouth daily.      Marland Kitchen ZETIA 10 MG tablet TAKE ONE TABLET BY MOUTH ONCE DAILY  30 tablet  5   No current facility-administered medications for this visit.    History   Social History  . Marital Status: Married    Spouse Name: N/A    Number of Children: N/A  . Years of Education: N/A   Occupational History  . Not on file.   Social History Main Topics  . Smoking status: Never Smoker   . Smokeless tobacco: Never Used  . Alcohol Use: Yes     Comment: Occasionally-very rare  . Drug Use: Not on file  . Sexual Activity: Not on file   Other Topics Concern  . Not on file   Social History Narrative  . No narrative on file    ROS General: Negative; No fevers, chills, or night sweats; no significant weight loss from her last office visit and in fact, a 9 pound weight gain HEENT: Negative; No changes in vision or hearing, sinus congestion, difficulty swallowing Pulmonary: Negative; No cough, wheezing, shortness of breath, hemoptysis Cardiovascular: Negative; No chest pain, presyncope, syncope, palpatations GI: Negative; No nausea, vomiting, diarrhea, or abdominal pain GU: Negative; No dysuria, hematuria, or difficulty voiding Musculoskeletal: Negative; no myalgias, joint pain, or weakness Hematologic/Oncology: Negative; no  easy bruising, bleeding Endocrine: Positive for diabetes; no heat/cold intolerance; Neuro: Negative; no changes in balance, headaches Skin: Negative; No rashes or skin lesions Psychiatric: Negative; No behavioral problems, depression Sleep: Negative; No snoring, daytime sleepiness, hypersomnolence, bruxism, restless legs, hypnogognic hallucinations, no cataplexy Other comprehensive 14 point system review is negative.   PE BP 130/72  Pulse 74  Ht 5' 4.5" (1.638 m)  Wt 208 lb 3.2 oz (94.439 kg)  BMI 35.20 kg/m2  General: Alert, oriented, no distress.  Skin: normal turgor, no rashes HEENT: Normocephalic, atraumatic. Pupils round and reactive; sclera anicteric;no lid lag.  Nose without nasal septal hypertrophy Mouth/Parynx benign; Mallinpatti  scale 3 Neck: No JVD, no carotid bruits with normal carotid upstroke Lungs: clear to ausculatation and percussion; no wheezing or rales Chest wall: Nontender to palpation Heart: RRR, s1 s2 normal 1/6 systolic murmur; no diastolic murmur.  No rubs, thrills or heaves. Abdomen: Moderate central adiposity soft, nontender; no hepatosplenomehaly, BS+; abdominal aorta nontender and not dilated by palpation. Back: No CVA tenderness Pulses 2+ Extremities: no clubbing cyanosis or edema, Homan's sign negative  Neurologic: grossly nonfocal Psychologic: Normal affect and mood   ECG (independently read by me): Normal sinus rhythm at 74 beats per minute.  Mild T wave changes V1, V2, and V3.  LABS:  BMET    Component Value Date/Time   NA 138 02/07/2014 0902   K 4.2 02/07/2014 0902   CL 99 02/07/2014 0902   CO2 29 02/07/2014 0902   GLUCOSE 178* 02/07/2014 0902   BUN 16 02/07/2014 0902   CREATININE 0.75 02/07/2014 0902   CREATININE 0.74 05/18/2010 0550   CALCIUM 9.2 02/07/2014 0902   GFRNONAA >60 05/18/2010 0550   GFRAA  Value: >60        The eGFR has been calculated using the MDRD equation. This calculation has not been validated in all clinical situations.  eGFR's persistently <60 mL/min signify possible Chronic Kidney Disease. 05/18/2010 0550     Hepatic Function Panel     Component Value Date/Time   PROT 6.9 02/07/2014 0902   ALBUMIN 4.3 02/07/2014 0902   AST 22 02/07/2014 0902   ALT 35 02/07/2014 0902   ALKPHOS 41 02/07/2014 0902   BILITOT 0.4 02/07/2014 0902     CBC    Component Value Date/Time   WBC 6.4 05/18/2010 0550   RBC 4.09 05/18/2010 0550   HGB 12.4 05/18/2010 0550   HCT 37.4 05/18/2010 0550   PLT 194 05/18/2010 0550   MCV 91.4 05/18/2010 0550   MCH 30.3 05/18/2010 0550   MCHC 33.2 05/18/2010 0550   RDW 12.9 05/18/2010 0550     BNP No results found for this basename: probnp    Lipid Panel     Component Value Date/Time   CHOL  Value: 154        ATP III CLASSIFICATION:  <200     mg/dL   Desirable  200-239  mg/dL   Borderline High  >=240    mg/dL   High        05/14/2010 0440   TRIG 160* 02/07/2014 0902   TRIG 204* 05/14/2010 0440   HDL 42 05/14/2010 0440   CHOLHDL 3.7 05/14/2010 0440   VLDL 41* 05/14/2010 0440   LDLCALC 44 02/07/2014 0902   LDLCALC  Value: 71        Total Cholesterol/HDL:CHD Risk Coronary Heart Disease Risk Table                     Men   Women  1/2 Average Risk   3.4   3.3  Average Risk       5.0   4.4  2 X Average Risk   9.6   7.1  3 X Average Risk  23.4   11.0        Use the calculated Patient Ratio above and the CHD Risk Table to determine the patient's CHD Risk.        ATP III CLASSIFICATION (LDL):  <100     mg/dL   Optimal  100-129  mg/dL   Near or Above  Optimal  130-159  mg/dL   Borderline  160-189  mg/dL   High  >190     mg/dL   Very High 05/14/2010 0440     RADIOLOGY: No results found.    ASSESSMENT AND PLAN: Mrs. Graylon [name] is a 68 years old and is 4 years status post two-vessel intervention including a 95% ostial LAD stenosis with PTCA stenting and tandem stenting to her RCA.Marland Kitchen Last year, her nuclear perfusion study continues to show normal perfusion and argues against restenosis.  Reviewed her  laboratory with her in detail.  Total cholesterol is excellent.  However, triglycerides are still mildly elevated.  She also has gained 9 pounds since her last office visit after previously having lost 5 pounds.  We discussed the importance of improved diet and increased exercise.  She's not having anginal symptoms.  Her blood pressure today is controlled on current therapy.  She is tolerating her lipid-lowering therapy without myalgias.  She continues to be on dual antiplatelet therapy at this ideally should be continued long-term unless problems arise.  As long as she remains stable, I will see her in one year for followup cardiology evaluation.   Time spent: 25 minutes  Troy Sine, MD, [facility]  03/01/2014 12:12 PM

## 2014-03-06 ENCOUNTER — Encounter: Payer: Self-pay | Admitting: *Deleted

## 2014-05-06 ENCOUNTER — Other Ambulatory Visit: Payer: Self-pay | Admitting: Cardiovascular Disease

## 2014-05-06 NOTE — Telephone Encounter (Signed)
Rx was sent to pharmacy electronically. 

## 2014-06-30 ENCOUNTER — Telehealth: Payer: Self-pay | Admitting: Cardiovascular Disease

## 2014-06-30 NOTE — Telephone Encounter (Signed)
Spoke with patient and let her know there are no samples avb. At this time. Patient states she is in the donut hole and her Rx cost has went up and does not want to pay the 100 dollars for the Rx if she does not have too.

## 2014-06-30 NOTE — Telephone Encounter (Signed)
Pt would like some samples of Zetia 10 mg please. °

## 2014-07-30 ENCOUNTER — Other Ambulatory Visit: Payer: Self-pay | Admitting: Cardiovascular Disease

## 2014-07-30 NOTE — Telephone Encounter (Signed)
Rx was sent to pharmacy electronically. 

## 2014-08-04 ENCOUNTER — Telehealth: Payer: Self-pay | Admitting: Cardiovascular Disease

## 2014-08-04 MED ORDER — EZETIMIBE 10 MG PO TABS: 10.0000 mg | ORAL_TABLET | Freq: Every day | ORAL | Status: DC

## 2014-08-04 MED ORDER — CARVEDILOL 12.5 MG PO TABS: 12.5000 mg | ORAL_TABLET | Freq: Two times a day (BID) | ORAL | Status: DC

## 2014-08-04 NOTE — Telephone Encounter (Signed)
Pt called in stating that she needs a new prescription for Carvedilol and she would like to know if she could get some samples of Zetia since she has now reached the donut holes. Please call  Thanks

## 2014-08-04 NOTE — Telephone Encounter (Signed)
Rx was sent to pharmacy electronically. Samples of zetia provided - patient notified

## 2014-10-20 ENCOUNTER — Telehealth: Payer: Self-pay | Admitting: Cardiovascular Disease

## 2014-10-20 MED ORDER — EZETIMIBE 10 MG PO TABS: 10.0000 mg | ORAL_TABLET | Freq: Every day | ORAL | Status: DC

## 2014-10-20 NOTE — Telephone Encounter (Signed)
Samples at front desk, pt informed. 

## 2014-10-20 NOTE — Telephone Encounter (Signed)
Diana [name] is calling to see if we have samples of Zetia    Thanks

## 2014-11-19 ENCOUNTER — Telehealth: Payer: Self-pay | Admitting: Cardiovascular Disease

## 2014-11-19 MED ORDER — EZETIMIBE 10 MG PO TABS
10.0000 mg | ORAL_TABLET | Freq: Every day | ORAL | Status: DC
Start: 2014-11-19 — End: 2015-08-24

## 2014-11-19 NOTE — Telephone Encounter (Signed)
Pt called in wanting to know if any samples of Zetia are available. Please f/u with pt  Thanks

## 2014-11-19 NOTE — Telephone Encounter (Signed)
Patient aware samples are at the front desk for pick up  

## 2015-01-06 ENCOUNTER — Ambulatory Visit: Payer: Medicare HMO | Admitting: Podiatry

## 2015-01-21 ENCOUNTER — Ambulatory Visit: Payer: Medicare HMO | Admitting: Podiatry

## 2015-01-29 ENCOUNTER — Encounter: Payer: Self-pay | Admitting: Podiatry

## 2015-01-29 ENCOUNTER — Ambulatory Visit (INDEPENDENT_AMBULATORY_CARE_PROVIDER_SITE_OTHER): Payer: Medicare HMO | Admitting: Podiatry

## 2015-01-29 VITALS — BP 127/66 | HR 76 | Resp 12

## 2015-01-29 DIAGNOSIS — L600 Ingrowing nail: Secondary | ICD-10-CM

## 2015-01-29 NOTE — Progress Notes (Signed)
   Subjective:    Patient ID: Diana [name], female    DOB: 07/11/1946, 69 y.o.   MRN: [id number]  HPI  PT STATED LT FOOT GREAT TOENAIL IS BEEN SORE FOR 2 MONTHS. THE TOENAIL IS DOING MUCH BETTER BUT THE NAIL IS THICK AND THICKER. TOENAIL GET AGGRAVATED BY PRESSURE. TOOK SOME ANTIBIOTIC FOR 10 DAYS BUT DONE WITH IT.  Review of Systems  Hematological: Bruises/bleeds easily.  All other systems reviewed and are negative.      Objective:   Physical Exam        Assessment & Plan:

## 2015-01-29 NOTE — Patient Instructions (Signed)

## 2015-01-30 NOTE — Progress Notes (Signed)
Subjective:     Patient ID: Diana [name], female   DOB: 11/22/1945, 69 y.o.   MRN: [id number]  HPI patient presents with painful ingrown toenail deformity left hallux medial border that makes shoe gear difficult and he has tried to trim and soak without relief and has been on an antibiotic which just helped the redness   Review of Systems  All other systems reviewed and are negative.      Objective:   Physical Exam  Constitutional: She is oriented to person, place, and time.  Cardiovascular: Intact distal pulses.   Musculoskeletal: Normal range of motion.  Neurological: She is oriented to person, place, and time.  Skin: Skin is warm.  Nursing note and vitals reviewed.  neurovascular status intact with muscle strength adequate and range of motion subtalar midtarsal joint within normal limits. Patient's noted to have an incurvated left hallux medial border that's painful when pressed and making shoe gear difficult. Patient has tried other treatment options without relief and no redness is noted currently with good digital perfusion and well oriented. I questioned him on his diabetes states that he is under excellent control with a typical fasting sugar of 100     Assessment:     Ingrown toenail deformity left hallux medial border with no other current risk factors    Plan:     H&P and condition reviewed with patient. I explained the surgery and the risk associated with this and patient wants procedure and today I infiltrated the left hallux 60 mg I can Marcaine mixture remove the medial border exposed matrix and applied phenol 3 applications 30 seconds followed by alcohol lavage and sterile dressing. Gave instructions on soaks and reappoint

## 2015-04-01 ENCOUNTER — Other Ambulatory Visit: Payer: Self-pay | Admitting: Cardiovascular Disease

## 2015-04-01 NOTE — Telephone Encounter (Signed)
Rx(s) sent to pharmacy electronically.  

## 2015-05-11 ENCOUNTER — Other Ambulatory Visit: Payer: Self-pay | Admitting: Cardiovascular Disease

## 2015-05-12 ENCOUNTER — Telehealth: Payer: Self-pay | Admitting: Cardiovascular Disease

## 2015-05-12 NOTE — Telephone Encounter (Signed)
Closed encounter °

## 2015-05-27 ENCOUNTER — Other Ambulatory Visit: Payer: Self-pay | Admitting: Cardiovascular Disease

## 2015-05-27 NOTE — Telephone Encounter (Signed)
Rx(s) sent to pharmacy electronically.  

## 2015-06-19 ENCOUNTER — Other Ambulatory Visit: Payer: Self-pay | Admitting: Cardiovascular Disease

## 2015-06-30 ENCOUNTER — Telehealth: Payer: Self-pay | Admitting: Cardiovascular Disease

## 2015-06-30 MED ORDER — CARVEDILOL 12.5 MG PO TABS: 12.5000 mg | ORAL_TABLET | Freq: Two times a day (BID) | ORAL | Status: DC

## 2015-06-30 MED ORDER — LOSARTAN POTASSIUM-HCTZ 50-12.5 MG PO TABS: ORAL_TABLET | ORAL | Status: DC

## 2015-06-30 NOTE — Telephone Encounter (Signed)
°  1. Which medications need to be refilled? Losartan, Carvediolol  2. Which pharmacy is medication to be sent to?[facility]   3. Do they need a 30 day or 90 day supply? 90  4. Would they like a call back once the medication has been sent to the pharmacy? no

## 2015-06-30 NOTE — Telephone Encounter (Signed)
Refills sent to pharmacy. 

## 2015-07-27 ENCOUNTER — Telehealth: Payer: Self-pay | Admitting: Cardiovascular Disease

## 2015-07-27 MED ORDER — CLOPIDOGREL BISULFATE 75 MG PO TABS: 75.0000 mg | ORAL_TABLET | Freq: Every day | ORAL | Status: DC

## 2015-07-27 NOTE — Telephone Encounter (Signed)
Refill submitted to patient's preferred pharmacy. Informed patient via VM, advised to keep upcoming scheduled appt.

## 2015-07-27 NOTE — Telephone Encounter (Signed)
°  1. Which medications need to be refilled? Clopidgrel  2. Which pharmacy is medication to be sent to[place]  3. Do they need a 30 day or 90 day supply? 30 and refills  4. Would they like a call back once the medication has been sent to the pharmacy? yes

## 2015-08-24 ENCOUNTER — Ambulatory Visit (INDEPENDENT_AMBULATORY_CARE_PROVIDER_SITE_OTHER): Payer: Medicare HMO | Admitting: Cardiovascular Disease

## 2015-08-24 ENCOUNTER — Encounter: Payer: Self-pay | Admitting: Cardiovascular Disease

## 2015-08-24 VITALS — BP 130/70 | HR 85 | Ht 64.0 in | Wt 204.1 lb

## 2015-08-24 DIAGNOSIS — E669 Obesity, unspecified: Secondary | ICD-10-CM

## 2015-08-24 DIAGNOSIS — I10 Essential (primary) hypertension: Secondary | ICD-10-CM

## 2015-08-24 DIAGNOSIS — I25810 Atherosclerosis of coronary artery bypass graft(s) without angina pectoris: Secondary | ICD-10-CM | POA: Diagnosis not present

## 2015-08-24 DIAGNOSIS — Z79899 Other long term (current) drug therapy: Secondary | ICD-10-CM | POA: Diagnosis not present

## 2015-08-24 DIAGNOSIS — E1140 Type 2 diabetes mellitus with diabetic neuropathy, unspecified: Secondary | ICD-10-CM | POA: Insufficient documentation

## 2015-08-24 DIAGNOSIS — E785 Hyperlipidemia, unspecified: Secondary | ICD-10-CM

## 2015-08-24 LAB — COMPREHENSIVE METABOLIC PANEL
ALK PHOS: 47 U/L (ref 33–130)
ALT: 26 U/L (ref 6–29)
AST: 16 U/L (ref 10–35)
Albumin: 4.1 g/dL (ref 3.6–5.1)
BUN: 15 mg/dL (ref 7–25)
CALCIUM: 10 mg/dL (ref 8.6–10.4)
CO2: 28 mmol/L (ref 20–31)
Chloride: 96 mmol/L — ABNORMAL LOW (ref 98–110)
Creat: 0.68 mg/dL (ref 0.50–0.99)
GLUCOSE: 176 mg/dL — AB (ref 65–99)
Potassium: 4.6 mmol/L (ref 3.5–5.3)
Sodium: 137 mmol/L (ref 135–146)
Total Bilirubin: 0.4 mg/dL (ref 0.2–1.2)
Total Protein: 7 g/dL (ref 6.1–8.1)

## 2015-08-24 LAB — CBC
HCT: 41.2 % (ref 36.0–46.0)
Hemoglobin: 13.7 g/dL (ref 12.0–15.0)
MCH: 29.1 pg (ref 26.0–34.0)
MCHC: 33.3 g/dL (ref 30.0–36.0)
MCV: 87.5 fL (ref 78.0–100.0)
MPV: 10 fL (ref 8.6–12.4)
Platelets: 250 10*3/uL (ref 150–400)
RBC: 4.71 MIL/uL (ref 3.87–5.11)
RDW: 13.8 % (ref 11.5–15.5)
WBC: 6.3 10*3/uL (ref 4.0–10.5)

## 2015-08-24 LAB — LIPID PANEL
CHOL/HDL RATIO: 3.3 ratio (ref <=5.0)
CHOLESTEROL: 145 mg/dL (ref 125–200)
HDL: 44 mg/dL — AB (ref >=46)
LDL Cholesterol: 62 mg/dL (ref <130)
Triglycerides: 196 mg/dL — ABNORMAL HIGH (ref <150)
VLDL: 39 mg/dL — ABNORMAL HIGH (ref <30)

## 2015-08-24 LAB — TSH: TSH: 1.962 u[IU]/mL (ref 0.350–4.500)

## 2015-08-24 NOTE — Progress Notes (Signed)
Patient ID: Diana [name], female   DOB: May 12, 1946, 69 y.o.   MRN: [id number]      HPI: Diana [name] is a 69 y.o. female presents to the office today for an 12 month cardiology follow up evaluation.  [name] has CAD and in July/August 2011 she underwent staged intervention with PTCA and stenting of a 95% ostial LAD stenosis (3.5x12 mm promiscuity a stent) postdilated 3.75 mm, end-stage intervention to) artery with tandem 3.0x28 and 2.5x23 mm promiscuity. She has a history of hyperlipidemia. In January 2012 while on Crestor she developed mild transaminase elevation leading to its discontinuance.  She has a history of diabetes mellitus which has been poorly controlled, GERD,  remote history of pneumonia. An echo Doppler study in March 2014 showed normal systolic function with grade 1 diastolic dysfunction. She did have mild aortic sclerosis with mild MR and mild LA dilation. I scheduled her for a two-year followup nuclear perfusion study but insurance I denied this.  Laboratory in April 2014 showed a glucose of 169. At that time, her cholesterol was 166 LDL 81 triglycerides were 192 and hemoglobin A1c was 9.0. Her primary physician is increased her insulin. An NMR evaluation showed an LDL cholesterol of 70 but due to her increased small LDL particles and 1086, LDL particle number was still significantly increased at 1383. HDL was 44 triglycerides are still elevated at 180. Recent hemoglobin A1c was 87. ,  She was started on Zetia, which she had tolerated.  [name] underwent a nuclear perfusion study on 06/04/2013 which continued to show normal perfusion and function. Ejection fraction was 70%. There was no evidence for scar or ischemia.  Over the last 18 months, she has felt well.  Specifically, she denies chest pain, palpitations, PND orthopnea.  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   She has not had recent laboratory with the exception of a hemoglobin when see by her primary physician.    She has been on losartan HCT and carvedilol for hypertension.  She has GERD on Protonix.  She continues to be on dual antiplatelet therapy.  She is on metformin, Amaryl and insulin for her diabetes.  She takes Neurontin for peripheral neuropathy.  Review of her medications does not reveal that she is on any Zetia at this time.  She takes over-the-counter fish oil.  Past Medical History  Diagnosis Date  . CAD (coronary artery disease) 7/12-8/12    staged LAD/RCA DES  . Dyslipidemia   . Diabetes mellitus ([place])   . HTN (hypertension)   . Diastolic dysfunction 6/38    grade 1     Past Surgical History  Procedure Laterality Date  . Percutaneous coronary stent intervention (pci-s)  05/14/10    LAD  . Percutaneous coronary stent intervention (pci-s)  05/17/10    RCA    Allergies  Allergen Reactions  . Lisinopril     Cough  . Sulfa Antibiotics Hives    Current Outpatient Prescriptions  Medication Sig Dispense Refill  . acarbose (PRECOSE) 50 MG tablet Take 1 tablet by mouth 2 (two) times daily.     Marland Kitchen aspirin 81 MG tablet Take 81 mg by mouth 3 (three) times daily.    . B Complex-C (SUPER B COMPLEX) TABS Take 1 tablet by mouth daily.    . Biotin 5000 MCG CAPS Take 1 capsule by mouth daily.    Marland Kitchen  Calcium-Magnesium-Vitamin D (CALCIUM 500 PO) Take 1 tablet by mouth daily.    . carvedilol (COREG) 12.5 MG tablet Take 1 tablet (12.5 mg total) by mouth 2 (two) times daily. Keep appointment with [facility] 08/24/15 180 tablet 0  . Cholecalciferol (VITAMIN D-3) 1000 UNITS CAPS Take 1 capsule by mouth daily.    . clopidogrel (PLAVIX) 75 MG tablet Take 1 tablet (75 mg total) by mouth daily. Keep appt for refills. 30 tablet 0  . fish oil-omega-3 fatty acids 1000 MG capsule Take 1 g by mouth daily.    Marland Kitchen gabapentin (NEURONTIN) 300 MG capsule Take 300 mg by mouth 3 (three) times daily as needed.    Marland Kitchen  glimepiride (AMARYL) 2 MG tablet Take 2 mg by mouth daily before breakfast.    . LANTUS SOLOSTAR 100 UNIT/ML SOPN Inject 50 mLs into the skin daily.     Marland Kitchen losartan-hydrochlorothiazide (HYZAAR) 50-12.5 MG per tablet TAKE 1 TABLET EVERY DAY Keep appointment with [facility] 08/24/15 90 tablet 0  . metFORMIN (GLUCOPHAGE) 1000 MG tablet Take 1 tablet by mouth 2 (two) times daily.    . pantoprazole (PROTONIX) 40 MG tablet Take 1 tablet by mouth daily.     No current facility-administered medications for this visit.    Social History   Social History  . Marital Status: Married    Spouse Name: N/A  . Number of Children: N/A  . Years of Education: N/A   Occupational History  . Not on file.   Social History Main Topics  . Smoking status: Never Smoker   . Smokeless tobacco: Never Used  . Alcohol Use: Yes     Comment: Occasionally-very rare  . Drug Use: Not on file  . Sexual Activity: Not on file   Other Topics Concern  . Not on file   Social History Narrative    ROS General: Negative; No fevers, chills, or night sweats; no significant weight loss from her last office visit and in fact, a 9 pound weight gain HEENT: Negative; No changes in vision or hearing, sinus congestion, difficulty swallowing Pulmonary: Negative; No cough, wheezing, shortness of breath, hemoptysis Cardiovascular: See history of present illness GI: Negative; No nausea, vomiting, diarrhea, or abdominal pain GU: Negative; No dysuria, hematuria, or difficulty voiding Musculoskeletal: Negative; no myalgias, joint pain, or weakness Hematologic/Oncology: Negative; no easy bruising, bleeding Endocrine: Positive for diabetes; no heat/cold intolerance; Neuro: Negative; no changes in balance, headaches Skin: Negative; No rashes or skin lesions Psychiatric: Negative; No behavioral problems, depression Sleep: Negative; No snoring, daytime sleepiness, hypersomnolence, bruxism, restless legs, hypnogognic hallucinations, no  cataplexy Other comprehensive 14 point system review is negative.   PE BP 130/70 mmHg  Pulse 85  Ht _0  (1.626 m)  Wt 204 lb 1.6 oz (92.579 kg)  BMI 35.02 kg/m2   Wt Readings from Last 3 Encounters:  08/24/15 204 lb 1.6 oz (92.579 kg)  02/18/14 208 lb 3.2 oz (94.439 kg)  07/09/13 199 lb 6.4 oz (90.447 kg)   General: Alert, oriented, no distress.  Skin: normal turgor, no rashes HEENT: Normocephalic, atraumatic. Pupils round and reactive; sclera anicteric;no lid lag.  Nose without nasal septal hypertrophy Mouth/Parynx benign; Mallinpatti scale 3 Neck: No JVD, no carotid bruits with normal carotid upstroke Lungs: clear to ausculatation and percussion; no wheezing or rales Chest wall: Nontender to palpation Heart: RRR, s1 s2 normal 1/6 systolic murmur; no diastolic murmur.  No rubs, thrills or heaves. Abdomen: Moderate central adiposity soft, nontender; no hepatosplenomehaly, BS+; abdominal aorta nontender  and not dilated by palpation. Back: No CVA tenderness Pulses 2+ Extremities: no clubbing cyanosis or edema, Homan's sign negative  Neurologic: grossly nonfocal Psychologic: Normal affect and mood  ECG (independently read by me): Normal sinus rhythm at 85 bpm.  Normal intervals.  No significant ST segment changes.   May 2015 ECG (independently read by me): Normal sinus rhythm at 74 beats per minute.  Mild T wave changes V1, V2, and V3.  LABS:  BMP Latest Ref Rng 02/07/2014 05/17/2013 05/18/2010  Glucose 70 - 99 mg/dL 178(H) 173(H) 176(H)  BUN 6 - 23 mg/dL _0 Creatinine 0.50 - 1.10 mg/dL 0.75 0.77 0.74  Sodium 135 - 145 mEq/L 138 137 139  Potassium 3.5 - 5.3 mEq/L 4.2 4.3 4.3  Chloride 96 - 112 mEq/L 99 101 106  CO2 19 - 32 mEq/L _1 Calcium 8.4 - 10.5 mg/dL 9.2 9.1 8.6   Hepatic Function Latest Ref Rng 02/07/2014 05/17/2013 05/14/2010  Total Protein 6.0 - 8.3 g/dL 6.9 7.0 7.6  Albumin 3.5 - 5.2 g/dL 4.3 4.3 4.2  AST 0 - 37 U/L _2 ALT 0 - 35 U/L 35 28  43(H)  Alk Phosphatase 39 - 117 U/L 41 43 49  Total Bilirubin 0.2 - 1.2 mg/dL 0.4 0.4 0.5   CBC Latest Ref Rng 05/18/2010 05/17/2010 05/16/2010  WBC 4.0 - 10.5 K/uL 6.4 5.8 5.6  Hemoglobin 12.0 - 15.0 g/dL 12.4 13.6 13.5  Hematocrit 36.0 - 46.0 % 37.4 39.7 39.5  Platelets 150 - 400 K/uL 194 192 177   Lab Results  Component Value Date   MCV 91.4 05/18/2010   MCV 92.3 05/17/2010   MCV 92.2 05/16/2010   No results found for: TSH  Lipid Panel     Component Value Date/Time   CHOL 122 02/07/2014 0902   CHOL  05/14/2010 0440    154        ATP III CLASSIFICATION:  <200     mg/dL   Desirable  200-239  mg/dL   Borderline High  >=240    mg/dL   High          TRIG 160* 02/07/2014 0902   TRIG 204* 05/14/2010 0440   HDL 46 02/07/2014 0902   HDL 42 05/14/2010 0440   CHOLHDL 3.7 05/14/2010 0440   VLDL 41* 05/14/2010 0440   LDLCALC 44 02/07/2014 0902   LDLCALC  05/14/2010 0440    71        Total Cholesterol/HDL:CHD Risk Coronary Heart Disease Risk Table                     Men   Women  1/2 Average Risk   3.4   3.3  Average Risk       5.0   4.4  2 X Average Risk   9.6   7.1  3 X Average Risk  23.4   11.0        Use the calculated Patient Ratio above and the CHD Risk Table to determine the patient's CHD Risk.        ATP III CLASSIFICATION (LDL):  <100     mg/dL   Optimal  100-129  mg/dL   Near or Above                    Optimal  130-159  mg/dL   Borderline  160-189  mg/dL   High  >190  mg/dL   Very High    RADIOLOGY: No results found.    ASSESSMENT AND PLAN: Diana [name] is a 69 years old and is 5 years status post two-vessel intervention including a 95% ostial LAD stenosis with PTCA stenting and tandem stenting to her RCA.Marland Kitchen Her last nuclear perfusion study in 2014 remained low risk.  Her blood pressure today is stable on her current dose of losartan HCT 50/12.5 and carvedilol 12.5 twice a day.  Her GERD is controlled with Protonix.  She is diabetic and hemoglobin A1c was  recently checked by Dr. Tamsen Roers.  I do not have these results available, but she is on Glucophage, Amaryl, and insulin.  She is not on statin.  When I had last seen her, she was on Zetia but she is not taking this presently.  She is fasting.  A complete set of blood work will be obtained.  She does have moderate obesity with a BMI of 35.02.  We discussed weight loss.  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  I will contact her regarding her laboratory and adjustments to her medical regimen will be made if necessary.  We discussed increased exercise.  I will see her in one year for reevaluation or sooner if problems arise.   Time spent: 25 minutes  Troy Sine, MD, [facility]  08/24/2015 8:28 AM

## 2015-08-24 NOTE — Patient Instructions (Signed)
Your physician recommends that you return for lab work TODAY.   Your physician wants you to follow-up in:1 year or sooner if needed. You will receive a reminder letter in the mail two months in advance. If you don't receive a letter, please call our office to schedule the follow-up appointment.   If you need a refill on your cardiac medications before your next appointment, please call your pharmacy. 

## 2015-08-27 ENCOUNTER — Encounter: Payer: Self-pay | Admitting: Cardiovascular Disease

## 2015-09-14 ENCOUNTER — Telehealth: Payer: Self-pay | Admitting: *Deleted

## 2015-09-14 MED ORDER — LOSARTAN POTASSIUM-HCTZ 50-12.5 MG PO TABS: ORAL_TABLET | ORAL | Status: DC

## 2015-09-14 NOTE — Telephone Encounter (Signed)
-----   Message from Lennette Biharihomas A Kelly, MD sent at 09/13/2015  4:03 PM EST ----- Lab ok x needs better glu control; TG, VLDL elevated; increase fish oil to 2 capsules daily; decrease carbs, sweets

## 2015-09-14 NOTE — Telephone Encounter (Signed)
Patient notified of lab results and recommendations. Patient voiced verbal understanding of results given. She also requested refill on her losartan-hct to be sent in to her pharmacy. RX sent to [facility].

## 2015-09-28 ENCOUNTER — Other Ambulatory Visit: Payer: Self-pay | Admitting: *Deleted

## 2015-09-28 MED ORDER — CLOPIDOGREL BISULFATE 75 MG PO TABS: 75.0000 mg | ORAL_TABLET | Freq: Every day | ORAL | Status: DC

## 2015-09-29 ENCOUNTER — Other Ambulatory Visit: Payer: Self-pay | Admitting: Cardiovascular Disease

## 2015-09-29 MED ORDER — CARVEDILOL 12.5 MG PO TABS: 12.5000 mg | ORAL_TABLET | Freq: Two times a day (BID) | ORAL | Status: DC

## 2016-03-24 ENCOUNTER — Encounter: Payer: Self-pay | Admitting: *Deleted

## 2016-03-24 ENCOUNTER — Encounter: Payer: Medicare HMO | Attending: Family Medicine | Admitting: *Deleted

## 2016-03-24 DIAGNOSIS — E109 Type 1 diabetes mellitus without complications: Secondary | ICD-10-CM | POA: Insufficient documentation

## 2016-03-24 DIAGNOSIS — E119 Type 2 diabetes mellitus without complications: Secondary | ICD-10-CM

## 2016-03-24 NOTE — Progress Notes (Signed)
Diabetes Self-Management Education  Visit Type: First/Initial  Appt. Start Time: 0745 Appt. End Time: 0975  03/24/2016  [name], identified by name and date of birth, is a 70 y.o. female with a diagnosis of Diabetes: Type 1.   ASSESSMENT       Diabetes Self-Management Education - 03/24/16 0759    Visit Information   Visit Type First/Initial   Initial Visit   Diabetes Type Type 1   Are you currently following a meal plan? No   Are you taking your medications as prescribed? Yes   Date Diagnosed 1990s   Health Coping   How would you rate your overall health? Good   Psychosocial Assessment   Patient Belief/Attitude about Diabetes Motivated to manage diabetes   Self-care barriers None   Other persons present Patient   Patient Concerns Nutrition/Meal planning;Glycemic Control;Medication   Special Needs None   Preferred Learning Style No preference indicated   Learning Readiness Ready   How often do you need to have someone help you when you read instructions, pamphlets, or other written materials from your doctor or pharmacy? 1 - Never   What is the last grade level you completed in school? college   Pre-Education Assessment   Patient understands the diabetes disease and treatment process. Needs Instruction   Patient understands incorporating nutritional management into lifestyle. Needs Review   Patient undertands incorporating physical activity into lifestyle. Needs Review   Patient understands using medications safely. Demonstrates understanding / competency   Patient understands monitoring blood glucose, interpreting and using results Needs Instruction   Patient understands prevention, detection, and treatment of acute complications. Needs Review   Patient understands prevention, detection, and treatment of chronic complications. Needs Review   Patient understands how to develop strategies to address psychosocial issues. Needs Review   Patient understands how to  develop strategies to promote health/change behavior. Needs Review   Complications   Last HgB A1C per patient/outside source 8.8 %   How often do you check your blood sugar? 1-2 times/day   Fasting Blood glucose range (mg/dL) 161-096;[id number];130-179   Postprandial Blood glucose range (mg/dL) 811-914;782-956130-179;180-200   Number of hypoglycemic episodes per month 0   Have you had a dilated eye exam in the past 12 months? Yes   Have you had a dental exam in the past 12 months? No   Are you checking your feet? Yes   How many days per week are you checking your feet? 7   Dietary Intake   Breakfast poptart (usually oatmeal)   Lunch pork chop, salad, [place] toast   Snack (afternoon) yogurt   Dinner shepherd's pie (1 cup), yogurt   Beverage(s) unsweet tea, coffee with creamer, water   Exercise   Exercise Type Light (walking / raking leaves)   How many days per week to you exercise? 6   How many minutes per day do you exercise? 30   Total minutes per week of exercise 180   Patient Education   Previous Diabetes Education Yes (please comment)   Disease state  Definition of diabetes, type 1 and 2, and the diagnosis of diabetes;Factors that contribute to the development of diabetes   Nutrition management  Role of diet in the treatment of diabetes and the relationship between the three main macronutrients and blood glucose level;Food label reading, portion sizes and measuring food.;Carbohydrate counting;Meal timing in regards to the patients' current diabetes medication.;Meal options for control of blood glucose level and chronic complications.   Physical activity and  exercise  Role of exercise on diabetes management, blood pressure control and cardiac health.;Helped patient identify appropriate exercises in relation to his/her diabetes, diabetes complications and other health issue.   Medications Reviewed patients medication for diabetes, action, purpose, timing of dose and side effects.   Monitoring Taught/discussed  recording of test results and interpretation of SMBG.   Acute complications Taught treatment of hypoglycemia - the 15 rule.   Chronic complications Relationship between chronic complications and blood glucose control   Psychosocial adjustment Role of stress on diabetes   Individualized Goals (developed by patient)   Nutrition Follow meal plan discussed;General guidelines for healthy choices and portions discussed   Physical Activity Exercise 3-5 times per week;45 minutes per day   Medications take my medication as prescribed   Monitoring  test my blood glucose as discussed;send in my blood glucose log as discussed   Reducing Risk examine blood glucose patterns   Post-Education Assessment   Patient understands the diabetes disease and treatment process. Demonstrates understanding / competency   Patient understands incorporating nutritional management into lifestyle. Demonstrates understanding / competency   Patient undertands incorporating physical activity into lifestyle. Demonstrates understanding / competency   Patient understands using medications safely. Demonstrates understanding / competency   Patient understands monitoring blood glucose, interpreting and using results Demonstrates understanding / competency   Patient understands prevention, detection, and treatment of acute complications. Demonstrates understanding / competency   Patient understands prevention, detection, and treatment of chronic complications. Demonstrates understanding / competency   Patient understands how to develop strategies to address psychosocial issues. Demonstrates understanding / competency   Patient understands how to develop strategies to promote health/change behavior. Demonstrates understanding / competency   Outcomes   Expected Outcomes Demonstrated interest in learning. Expect positive outcomes   Future DMSE PRN   Program Status Completed      Individualized Plan for Diabetes Self-Management  Training:   Learning Objective:  Patient will have a greater understanding of diabetes self-management. Patient education plan is to attend individual and/or group sessions per assessed needs and concerns.    Expected Outcomes:  Demonstrated interest in learning. Expect positive outcomes  Education material provided: Living Well with Diabetes, Meal plan card and Snack sheet  If problems or questions, patient to contact team via:  Phone  Future DSME appointment: PRN

## 2016-06-13 ENCOUNTER — Telehealth: Payer: Self-pay | Admitting: Cardiovascular Disease

## 2016-06-13 NOTE — Telephone Encounter (Signed)
Clearance routed to MD Tresa EndoKelly.

## 2016-06-13 NOTE — Telephone Encounter (Signed)
New message       What dental office are you calling from? Dr Gwendolyn GrantWalrond What is your office phone and fax number? Fax [phone] What type of procedure is the patient having performed? extraction 1. What date is procedure scheduled? Pending clearance 2. What is your question (ex. Antibiotics prior to procedure, holding medication-we need to know how long dentist wants pt to hold med)? Hold generic plavix prior to extraction?

## 2016-06-14 NOTE — Telephone Encounter (Signed)
Returned call to [place]. Extraction of multiple teeth is planned, but not currently scheduled. Not time sensitive, but they do need instruction on Plavix hold.  This has already been routed to Dr. Tresa EndoKelly for review.

## 2016-06-14 NOTE — Telephone Encounter (Signed)
New Message  Tina from Dr. Gwendolyn GrantWalrond office call to f/u on the surgical clearance. please call back to discuss

## 2016-06-15 NOTE — Telephone Encounter (Signed)
[facility] Sharol RousselWalrond's office asking if Dr Tresa EndoKelly had addressed the clearance for the patient. Advised Dr Tresa EndoKelly is OOO today but the message has been routed to him to address. She verbalized understanding.

## 2016-06-17 NOTE — Telephone Encounter (Signed)
Hold plavix x 5 days.

## 2016-06-21 NOTE — Telephone Encounter (Signed)
Clearance routed to number [phone], attention: Inetta [place].

## 2016-09-06 ENCOUNTER — Other Ambulatory Visit: Payer: Self-pay | Admitting: *Deleted

## 2016-09-06 MED ORDER — CLOPIDOGREL BISULFATE 75 MG PO TABS: 75.0000 mg | ORAL_TABLET | Freq: Every day | ORAL | 3 refills | Status: DC

## 2016-09-06 NOTE — Telephone Encounter (Signed)
E-sent to pharmacy . Needs appointment

## 2016-09-26 ENCOUNTER — Other Ambulatory Visit: Payer: Self-pay | Admitting: *Deleted

## 2016-09-26 MED ORDER — CARVEDILOL 12.5 MG PO TABS: 12.5000 mg | ORAL_TABLET | Freq: Two times a day (BID) | ORAL | 0 refills | Status: DC

## 2016-09-29 ENCOUNTER — Other Ambulatory Visit: Payer: Self-pay | Admitting: Cardiovascular Disease

## 2016-11-04 ENCOUNTER — Other Ambulatory Visit: Payer: Self-pay

## 2016-11-04 MED ORDER — LOSARTAN POTASSIUM-HCTZ 50-12.5 MG PO TABS: 1.0000 | ORAL_TABLET | Freq: Every day | ORAL | 0 refills | Status: DC

## 2016-11-13 ENCOUNTER — Emergency Department (HOSPITAL_COMMUNITY): Payer: Medicare Other

## 2016-11-13 ENCOUNTER — Emergency Department (HOSPITAL_COMMUNITY)
Admission: EM | Admit: 2016-11-13 | Discharge: 2016-11-13 | Disposition: A | Discharge status: Home / Self Care | Payer: Medicare Other | Attending: Family Medicine | Admitting: Family Medicine

## 2016-11-13 ENCOUNTER — Encounter (HOSPITAL_COMMUNITY): Payer: Self-pay | Admitting: Emergency Medicine

## 2016-11-13 DIAGNOSIS — Z794 Long term (current) use of insulin: Secondary | ICD-10-CM | POA: Diagnosis not present

## 2016-11-13 DIAGNOSIS — Z79899 Other long term (current) drug therapy: Secondary | ICD-10-CM | POA: Diagnosis not present

## 2016-11-13 DIAGNOSIS — R0981 Nasal congestion: Secondary | ICD-10-CM | POA: Diagnosis not present

## 2016-11-13 DIAGNOSIS — E1165 Type 2 diabetes mellitus with hyperglycemia: Secondary | ICD-10-CM

## 2016-11-13 DIAGNOSIS — R509 Fever, unspecified: Secondary | ICD-10-CM | POA: Insufficient documentation

## 2016-11-13 DIAGNOSIS — E119 Type 2 diabetes mellitus without complications: Secondary | ICD-10-CM | POA: Diagnosis not present

## 2016-11-13 DIAGNOSIS — R519 Headache, unspecified: Secondary | ICD-10-CM

## 2016-11-13 DIAGNOSIS — R05 Cough: Secondary | ICD-10-CM | POA: Diagnosis not present

## 2016-11-13 DIAGNOSIS — R51 Headache: Secondary | ICD-10-CM | POA: Diagnosis not present

## 2016-11-13 DIAGNOSIS — R69 Illness, unspecified: Secondary | ICD-10-CM

## 2016-11-13 DIAGNOSIS — R52 Pain, unspecified: Secondary | ICD-10-CM

## 2016-11-13 DIAGNOSIS — J441 Chronic obstructive pulmonary disease with (acute) exacerbation: Secondary | ICD-10-CM | POA: Diagnosis present

## 2016-11-13 DIAGNOSIS — R112 Nausea with vomiting, unspecified: Secondary | ICD-10-CM | POA: Diagnosis not present

## 2016-11-13 DIAGNOSIS — R059 Cough, unspecified: Secondary | ICD-10-CM

## 2016-11-13 DIAGNOSIS — Z7982 Long term (current) use of aspirin: Secondary | ICD-10-CM | POA: Diagnosis not present

## 2016-11-13 DIAGNOSIS — J111 Influenza due to unidentified influenza virus with other respiratory manifestations: Secondary | ICD-10-CM

## 2016-11-13 LAB — COMPREHENSIVE METABOLIC PANEL
ALBUMIN: 3.7 g/dL (ref 3.5–5.0)
ALK PHOS: 44 U/L (ref 38–126)
ALT: 29 U/L (ref 14–54)
AST: 19 U/L (ref 15–41)
Anion gap: 11 (ref 5–15)
BILIRUBIN TOTAL: 0.9 mg/dL (ref 0.3–1.2)
BUN: 11 mg/dL (ref 6–20)
CALCIUM: 9 mg/dL (ref 8.9–10.3)
CO2: 26 mmol/L (ref 22–32)
CREATININE: 0.75 mg/dL (ref 0.44–1.00)
Chloride: 95 mmol/L — ABNORMAL LOW (ref 101–111)
GFR calc Af Amer: >60 mL/min (ref >60)
GFR calc non Af Amer: >60 mL/min (ref >60)
GLUCOSE: 214 mg/dL — AB (ref 65–99)
Potassium: 3.9 mmol/L (ref 3.5–5.1)
SODIUM: 132 mmol/L — AB (ref 135–145)
TOTAL PROTEIN: 7.3 g/dL (ref 6.5–8.1)

## 2016-11-13 LAB — CBC WITH DIFFERENTIAL/PLATELET
Basophils Absolute: 0 10*3/uL (ref 0.0–0.1)
Basophils Relative: 0 %
EOS ABS: 0 10*3/uL (ref 0.0–0.7)
EOS PCT: 1 %
HCT: 39.1 % (ref 36.0–46.0)
HEMOGLOBIN: 12.9 g/dL (ref 12.0–15.0)
LYMPHS ABS: 0.8 10*3/uL (ref 0.7–4.0)
Lymphocytes Relative: 13 %
MCH: 28.7 pg (ref 26.0–34.0)
MCHC: 33 g/dL (ref 30.0–36.0)
MCV: 87.1 fL (ref 78.0–100.0)
Monocytes Absolute: 0.6 10*3/uL (ref 0.1–1.0)
Monocytes Relative: 10 %
NEUTROS PCT: 76 %
Neutro Abs: 4.7 10*3/uL (ref 1.7–7.7)
Platelets: 197 10*3/uL (ref 150–400)
RBC: 4.49 MIL/uL (ref 3.87–5.11)
RDW: 13.5 % (ref 11.5–15.5)
WBC: 6.1 10*3/uL (ref 4.0–10.5)

## 2016-11-13 LAB — CBG MONITORING, ED: GLUCOSE-CAPILLARY: 210 mg/dL — AB (ref 65–99)

## 2016-11-13 LAB — INFLUENZA PANEL BY PCR (TYPE A & B)
Influenza A By PCR: POSITIVE — AB
Influenza B By PCR: NEGATIVE

## 2016-11-13 LAB — I-STAT CG4 LACTIC ACID, ED: Lactic Acid, Venous: 1.44 mmol/L (ref 0.5–1.9)

## 2016-11-13 MED ORDER — BENZONATATE 100 MG PO CAPS: 100.0000 mg | ORAL_CAPSULE | Freq: Three times a day (TID) | ORAL | 0 refills | Status: DC | PRN

## 2016-11-13 MED ORDER — ACETAMINOPHEN 325 MG PO TABS
ORAL_TABLET | ORAL | Status: AC
  Filled 2016-11-13: qty 2

## 2016-11-13 MED ORDER — NAPROXEN 500 MG PO TABS: 500.0000 mg | ORAL_TABLET | Freq: Two times a day (BID) | ORAL | 0 refills | Status: DC

## 2016-11-13 MED ORDER — ONDANSETRON 4 MG PO TBDP
ORAL_TABLET | ORAL | Status: AC
  Filled 2016-11-13: qty 1

## 2016-11-13 MED ORDER — ACETAMINOPHEN 325 MG PO TABS
650.0000 mg | ORAL_TABLET | Freq: Once | ORAL | Status: AC
  Administered 2016-11-13: 650 mg via ORAL

## 2016-11-13 MED ORDER — ONDANSETRON 4 MG PO TBDP
4.0000 mg | ORAL_TABLET | Freq: Once | ORAL | Status: AC
  Administered 2016-11-13: 4 mg via ORAL

## 2016-11-13 MED ORDER — OSELTAMIVIR PHOSPHATE 75 MG PO CAPS
75.0000 mg | ORAL_CAPSULE | Freq: Once | ORAL | Status: AC
  Administered 2016-11-13: 75 mg via ORAL
  Filled 2016-11-13: qty 1

## 2016-11-13 MED ORDER — ONDANSETRON 4 MG PO TBDP: 4.0000 mg | ORAL_TABLET | Freq: Three times a day (TID) | ORAL | 0 refills | Status: DC | PRN

## 2016-11-13 MED ORDER — OSELTAMIVIR PHOSPHATE 75 MG PO CAPS: 75.0000 mg | ORAL_CAPSULE | Freq: Two times a day (BID) | ORAL | 0 refills | Status: AC

## 2016-11-13 NOTE — ED Triage Notes (Signed)
Pt here with fever and cough x 4 days; pt sts hx of recent PNA; pt sts 1 episode of N/V today

## 2016-11-13 NOTE — ED Notes (Signed)
Waiting for tamiflu from pharmacy

## 2016-11-13 NOTE — ED Provider Notes (Signed)
[facility]-EMERGENCY DEPT Provider Note  CSN: [id number] Arrival date & time: 11/13/16  1606  History   Chief Complaint Chief Complaint  Patient presents with  . Fever   HPI Diana MathMarion Marban is a 71 y.o. female.  The history is provided by the patient, a relative and medical records. No language interpreter was used.  Illness  This is a new problem. The current episode started more than 2 days ago. The problem occurs constantly. The problem has been gradually worsening. Associated symptoms include headaches and shortness of breath. Pertinent negatives include no chest pain and no abdominal pain. Nothing aggravates the symptoms. Nothing relieves the symptoms.    Past Medical History:  Diagnosis Date  . CAD (coronary artery disease) 7/12-8/12   staged LAD/RCA DES  . Diabetes mellitus (HCC)   . Diastolic dysfunction 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   . Dyslipidemia   . HTN (hypertension)    Patient Active Problem List   Diagnosis Date Noted  . Diabetes mellitus with diabetic neuropathy (HCC) 08/24/2015  . Dyslipidemia   . Diabetes mellitus (HCC)   . Diastolic dysfunction-garde 1   . CAD-staged LAD DES (July 2011), RCA DES (Aug 2011) 05/24/2013  . DM2 (diabetes mellitus, type 2) (HCC) 05/24/2013  . HTN (hypertension) 05/24/2013  . Obesity (BMI 30-39.9) 05/24/2013   Past Surgical History:  Procedure Laterality Date  . PERCUTANEOUS CORONARY STENT INTERVENTION (PCI-S)  05/14/10   LAD  . PERCUTANEOUS CORONARY STENT INTERVENTION (PCI-S)  05/17/10   RCA   OB History    No data available      Home Medications    Prior to Admission medications   Medication Sig Start Date End Date Taking? Authorizing Provider  acarbose (PRECOSE) 50 MG tablet Take 1 tablet by mouth 2 (two) times daily.  05/21/13   Historical Provider, MD  aspirin 81 MG tablet Take 81 mg by mouth 3 (three) times daily.    Historical Provider, MD  B Complex-C (SUPER B COMPLEX) TABS Take 1 tablet by mouth daily. Reported on 03/24/2016     Historical Provider, MD  benzonatate (TESSALON PERLES) 100 MG capsule Take 1 capsule (100 mg total) by mouth 3 (three) times daily as needed for cough. 11/13/16   Angelina Okyan Girlie Veltri, MD  Biotin 5000 MCG CAPS Take 1 capsule by mouth daily.    Historical Provider, MD  Calcium-Magnesium-Vitamin D (CALCIUM 500 PO) Take 1 tablet by mouth daily.    Historical Provider, MD  carvedilol (COREG) 12.5 MG tablet Take 1 tablet (12.5 mg total) by mouth 2 (two) times daily. 09/26/16   Lennette Biharihomas A Kelly, MD  Cholecalciferol (VITAMIN D-3) 1000 UNITS CAPS Take 1 capsule by mouth daily.    Historical Provider, MD  clopidogrel (PLAVIX) 75 MG tablet Take 1 tablet (75 mg total) by mouth daily. Needs appointment 09/06/16   Lennette Biharihomas A Kelly, MD  exenatide (BYETTA) 10 MCG/0.04ML SOPN injection Inject 10 mcg into the skin 2 (two) times daily with a meal.    Historical Provider, MD  fish oil-omega-3 fatty acids 1000 MG capsule Take 1 g by mouth daily.    Historical Provider, MD  gabapentin (NEURONTIN) 300 MG capsule Take 300 mg by mouth 3 (three) times daily as needed. Reported on 03/24/2016    Historical Provider, MD  glimepiride (AMARYL) 2 MG tablet Take 1 mg by mouth 2 (two) times daily.     Historical Provider, MD  LANTUS SOLOSTAR 100 UNIT/ML SOPN Inject 50 mLs into the skin daily.  03/13/13  Historical Provider, MD  Liraglutide (VICTOZA) 18 MG/3ML SOPN Inject into the skin.    Historical Provider, MD  losartan-hydrochlorothiazide (HYZAAR) 50-12.5 MG tablet Take 1 tablet by mouth daily. 11/04/16   Lennette Bihari, MD  metFORMIN (GLUCOPHAGE) 1000 MG tablet Take 500 mg by mouth daily.  04/29/13   Historical Provider, MD  naproxen (NAPROSYN) 500 MG tablet Take 1 tablet (500 mg total) by mouth 2 (two) times daily with a meal. 11/13/16   Angelina Ok, MD  ondansetron (ZOFRAN ODT) 4 MG disintegrating tablet Take 1 tablet (4 mg total) by mouth every 8 (eight) hours as needed for nausea or vomiting. 11/13/16   Angelina Ok, MD  oseltamivir  (TAMIFLU) 75 MG capsule Take 1 capsule (75 mg total) by mouth 2 (two) times daily. 11/13/16 11/20/16  Angelina Ok, MD  pantoprazole (PROTONIX) 40 MG tablet Take 1 tablet by mouth daily. Reported on 03/24/2016 05/06/13   Historical Provider, MD   Family History Family History  Problem Relation Age of Onset  . CAD Neg Hx    Social History Social History  Substance Use Topics  . Smoking status: Never Smoker  . Smokeless tobacco: Never Used  . Alcohol use Yes     Comment: Occasionally-very rare    Allergies   Lisinopril and Sulfa antibiotics  Review of Systems Review of Systems  Constitutional: Positive for appetite change (decreased), chills, fatigue and fever.  HENT: Positive for congestion.   Respiratory: Positive for cough and shortness of breath.   Cardiovascular: Negative for chest pain, palpitations and leg swelling.  Gastrointestinal: Positive for nausea and vomiting. Negative for abdominal pain and diarrhea.  Genitourinary: Negative for enuresis, frequency, hematuria, vaginal bleeding and vaginal discharge.  Neurological: Positive for headaches.  All other systems reviewed and are negative.   Physical Exam Updated Vital Signs BP 120/66   Pulse 96   Temp 100.9 F (38.3 C) (Oral)   Resp 20   SpO2 92%   Physical Exam  Constitutional: She is oriented to person, place, and time. No distress.  Obese elderly Caucasian female  HENT:  Head: Normocephalic and atraumatic.  Eyes: EOM are normal. Pupils are equal, round, and reactive to light.  Neck: Normal range of motion. Neck supple.  Cardiovascular: Regular rhythm and normal heart sounds.  Tachycardia present.   Pulmonary/Chest: Effort normal.  Faint coarse breath sounds at bases bilaterally, no tachypnea, no increased work of breathing, maintaining saturations on room air  Abdominal: Soft. Bowel sounds are normal. She exhibits no distension. There is no tenderness.  Musculoskeletal: Normal range of motion.    Neurological: She is alert and oriented to person, place, and time.  Skin: Skin is warm and dry. Capillary refill takes less than 2 seconds. She is not diaphoretic.  Nursing note and vitals reviewed.   ED Treatments / Results  Labs (all labs ordered are listed, but only abnormal results are displayed) Labs Reviewed  COMPREHENSIVE METABOLIC PANEL - Abnormal; Notable for the following:       Result Value   Sodium 132 (*)    Chloride 95 (*)    Glucose, Bld 214 (*)    All other components within normal limits  CBG MONITORING, ED - Abnormal; Notable for the following:    Glucose-Capillary 210 (*)    All other components within normal limits  CBC WITH DIFFERENTIAL/PLATELET  INFLUENZA PANEL BY PCR (TYPE A & B)  I-STAT CG4 LACTIC ACID, ED  I-STAT CG4 LACTIC ACID, ED   EKG  EKG  Interpretation None      Radiology Dg Chest 2 View  Result Date: 11/13/2016 CLINICAL DATA:  Fever and cough for several days EXAM: CHEST  2 VIEW COMPARISON:  05/13/2010 FINDINGS: The heart size and mediastinal contours are within normal limits. Both lungs are clear. The visualized skeletal structures are unremarkable. IMPRESSION: No active cardiopulmonary disease. Electronically Signed   By: Alcide Clever M.D.   On: 11/13/2016 16:45   Ct Chest Wo Contrast  Result Date: 11/13/2016 CLINICAL DATA:  Persistent cough for 3-4 days EXAM: CT CHEST WITHOUT CONTRAST TECHNIQUE: Multidetector CT imaging of the chest was performed following the standard protocol without IV contrast. COMPARISON:  Chest x-ray from earlier in the same day. FINDINGS: Cardiovascular: Somewhat limited due to the lack of IV contrast. Mild atherosclerotic calcifications are seen. No aneurysmal dilatation is noted. Coronary calcifications are seen. Cardiac structures are otherwise within normal limits. Mediastinum/Nodes: The thoracic inlet is within normal limits. No hilar or mediastinal adenopathy is seen. The distal esophagus demonstrates some mild  thickening likely related to reflux esophagitis. No definitive mass lesion is seen. Lungs/Pleura: Lungs are well aerated bilaterally. Minimal atelectatic changes are noted in the left lung base. No focal nodule is seen. No sizable effusion is noted. Upper Abdomen: Within normal limits. Musculoskeletal: Degenerative changes of the thoracic spine are noted. IMPRESSION: Minimal left basilar atelectasis. Distal esophageal thickening likely related to reflux esophagitis. No other focal abnormality is noted. Electronically Signed   By: Alcide Clever M.D.   On: 11/13/2016 18:34   Procedures Procedures (including critical care time)  Medications Ordered in ED Medications  ondansetron (ZOFRAN-ODT) 4 MG disintegrating tablet (not administered)  acetaminophen (TYLENOL) 325 MG tablet (not administered)  oseltamivir (TAMIFLU) capsule 75 mg (not administered)  acetaminophen (TYLENOL) tablet 650 mg (650 mg Oral Given 11/13/16 1618)  ondansetron (ZOFRAN-ODT) disintegrating tablet 4 mg (4 mg Oral Given 11/13/16 1618)    Initial Impression / Assessment and Plan / ED Course  I have reviewed the triage vital signs and the nursing notes.  71 y.o. female with above stated PMHx, HPI, and physical. Patient with cough and fever 4 weeks ago. Patient placed on Z-Pak without relief then outpatient Augmentin with relief. Patient finished Augmentin 1 week ago and was feeling better for 3-4 days. About 3 days ago patient again began experiencing fever, chills, productive cough, shortness of breath, headache, body aches, nausea, and vomiting. Presented for this reason.  Temp 101.8. Heart rate 1 teens. Normotensive. Patient otherwise well-appearing on exam. EKG showing No evidence of ST elevation/depression or T-wave inversions or interval upper maladies or dysrhythmias. Chest x-ray showing no evidence of pneumonia. CT chest without contrast obtained to evaluate for occult pneumonia which showed no evidence of focal airspace  opacities. Influenza swab collected, but based on patient's symptoms and high risk with poorly controlled diabetes decision was made to treat patient with Tamiflu. Patient will follow up closely with her PCP this week.  Laboratory and imaging results were personally reviewed by myself and used in the medical decision making of this patient's treatment and disposition.  Pt discharged home in stable condition. Strict ED return precautions dicussed. Pt understands and agrees with the plan and has no further questions or concerns.   Pt care discussed with and followed by my attending, Dr. Ruben Gottron, MD Pager [phone]  Final Clinical Impressions(s) / ED Diagnoses   Final diagnoses:  Influenza-like illness  Cough  Nasal congestion  Generalized headache  Nausea and vomiting  in adult  Body aches  Poorly controlled diabetes mellitus (HCC)   New Prescriptions New Prescriptions   BENZONATATE (TESSALON PERLES) 100 MG CAPSULE    Take 1 capsule (100 mg total) by mouth 3 (three) times daily as needed for cough.   NAPROXEN (NAPROSYN) 500 MG TABLET    Take 1 tablet (500 mg total) by mouth 2 (two) times daily with a meal.   ONDANSETRON (ZOFRAN ODT) 4 MG DISINTEGRATING TABLET    Take 1 tablet (4 mg total) by mouth every 8 (eight) hours as needed for nausea or vomiting.   OSELTAMIVIR (TAMIFLU) 75 MG CAPSULE    Take 1 capsule (75 mg total) by mouth 2 (two) times daily.     Angelina Ok, MD 11/13/16 1924    Rolland Porter, MD 11/13/16 [phone]

## 2016-11-15 ENCOUNTER — Ambulatory Visit: Payer: Medicare HMO | Admitting: Cardiovascular Disease

## 2016-12-05 ENCOUNTER — Other Ambulatory Visit: Payer: Self-pay

## 2016-12-05 MED ORDER — LOSARTAN POTASSIUM-HCTZ 50-12.5 MG PO TABS: 1.0000 | ORAL_TABLET | Freq: Every day | ORAL | 0 refills | Status: DC

## 2016-12-21 ENCOUNTER — Encounter: Payer: Self-pay | Admitting: Cardiovascular Disease

## 2016-12-21 ENCOUNTER — Ambulatory Visit (INDEPENDENT_AMBULATORY_CARE_PROVIDER_SITE_OTHER): Payer: Medicare HMO | Admitting: Cardiovascular Disease

## 2016-12-21 VITALS — BP 110/68 | HR 72 | Ht 64.0 in | Wt 200.0 lb

## 2016-12-21 DIAGNOSIS — Z79899 Other long term (current) drug therapy: Secondary | ICD-10-CM

## 2016-12-21 DIAGNOSIS — I2510 Atherosclerotic heart disease of native coronary artery without angina pectoris: Secondary | ICD-10-CM | POA: Diagnosis not present

## 2016-12-21 DIAGNOSIS — K219 Gastro-esophageal reflux disease without esophagitis: Secondary | ICD-10-CM | POA: Diagnosis not present

## 2016-12-21 DIAGNOSIS — Z794 Long term (current) use of insulin: Secondary | ICD-10-CM

## 2016-12-21 DIAGNOSIS — E785 Hyperlipidemia, unspecified: Secondary | ICD-10-CM

## 2016-12-21 DIAGNOSIS — I10 Essential (primary) hypertension: Secondary | ICD-10-CM | POA: Diagnosis not present

## 2016-12-21 DIAGNOSIS — E119 Type 2 diabetes mellitus without complications: Secondary | ICD-10-CM

## 2016-12-21 LAB — COMPREHENSIVE METABOLIC PANEL
ALK PHOS: 43 U/L (ref 33–130)
ALT: 22 U/L (ref 6–29)
AST: 14 U/L (ref 10–35)
Albumin: 4.1 g/dL (ref 3.6–5.1)
BILIRUBIN TOTAL: 0.4 mg/dL (ref 0.2–1.2)
BUN: 18 mg/dL (ref 7–25)
CO2: 29 mmol/L (ref 20–31)
CREATININE: 0.76 mg/dL (ref 0.50–0.99)
Calcium: 9.2 mg/dL (ref 8.6–10.4)
Chloride: 99 mmol/L (ref 98–110)
GLUCOSE: 216 mg/dL — AB (ref 65–99)
POTASSIUM: 4.3 mmol/L (ref 3.5–5.3)
SODIUM: 137 mmol/L (ref 135–146)
TOTAL PROTEIN: 7.1 g/dL (ref 6.1–8.1)

## 2016-12-21 LAB — LIPID PANEL
CHOL/HDL RATIO: 3.6 ratio (ref <5.0)
CHOLESTEROL: 176 mg/dL (ref <200)
HDL: 49 mg/dL — ABNORMAL LOW (ref >50)
LDL Cholesterol: 101 mg/dL — ABNORMAL HIGH (ref <100)
Triglycerides: 128 mg/dL (ref <150)
VLDL: 26 mg/dL (ref <30)

## 2016-12-21 LAB — CBC
HCT: 38.6 % (ref 35.0–45.0)
Hemoglobin: 12.9 g/dL (ref 11.7–15.5)
MCH: 29.3 pg (ref 27.0–33.0)
MCHC: 33.4 g/dL (ref 32.0–36.0)
MCV: 87.5 fL (ref 80.0–100.0)
MPV: 9.5 fL (ref 7.5–12.5)
PLATELETS: 236 10*3/uL (ref 140–400)
RBC: 4.41 MIL/uL (ref 3.80–5.10)
RDW: 14.6 % (ref 11.0–15.0)
WBC: 4.9 10*3/uL (ref 3.8–10.8)

## 2016-12-21 LAB — HEMOGLOBIN A1C
HEMOGLOBIN A1C: 10.7 % — AB (ref <5.7)
Mean Plasma Glucose: 260 mg/dL

## 2016-12-21 LAB — TSH: TSH: 1.45 mIU/L

## 2016-12-21 NOTE — Patient Instructions (Signed)
Your physician recommends that you return for lab work fasting.   Your physician wants you to follow-up in: 6 months or sooner if needed. You will receive a reminder letter in the mail two months in advance. If you don't receive a letter, please call our office to schedule the follow-up appointment.  If you need a refill on your cardiac medications before your next appointment, please call your pharmacy.   

## 2016-12-22 NOTE — Progress Notes (Signed)
Patient ID: Diana [name], female   DOB: 1946/02/26, 71 y.o.   MRN: [id number]      HPI: Diana [name] is a 71 y.o. female presents to the office today for an 77 month cardiology follow up evaluation.  Diana [name] has CAD and in July/August 2011 she underwent staged intervention with PTCA and stenting of a 95% ostial LAD stenosis (3.5x12 mm promiscuity a stent) postdilated 3.75 mm, end-stage intervention to) artery with tandem 3.0x28 and 2.5x23 mm promiscuity. She has a history of hyperlipidemia. In January 2012 while on Crestor she developed mild transaminase elevation leading to its discontinuance.  She has a history of diabetes mellitus which has been poorly controlled, GERD,  remote history of pneumonia. An echo Doppler study in March 2014 showed normal systolic function with grade 1 diastolic dysfunction. She did have mild aortic sclerosis with mild MR and mild LA dilation. I scheduled her for a two-year followup nuclear perfusion study but insurance I denied this.  Laboratory in April 2014 showed a glucose of 169. At that time, her cholesterol was 166 LDL 81 triglycerides were 192 and hemoglobin A1c was 9.0. Her primary physician is increased her insulin. An NMR evaluation showed an LDL cholesterol of 70 but due to her increased small LDL particles and 1086, LDL particle number was still significantly increased at 1383. HDL was 44 triglycerides are still elevated at 180. Recent hemoglobin A1c was 87. ,  She was started on Zetia, which she had tolerated.  Diana [name] underwent a nuclear perfusion study on 06/04/2013 which continued to show normal perfusion and function. Ejection fraction was 70%. There was no evidence for scar or ischemia.  Over the last 18 months, she has felt well.  Specifically, she denies chest pain, palpitations, PND orthopnea.  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   She has not had recent laboratory with the exception of a hemoglobin when see by her primary physician.    Since I last saw her in November 2016, she has continued to be on dual antiplatelet therapy with aspirin and Plavix.  She has been on losartan HCT and carvedilol for hypertension.  She has GERD on Protonix.   She is on metformin, Amaryl, Glyset,and Actos but is no longer taking insulin for her diabetes.  She takes Neurontin for peripheral neuropathy.  It does not appear that she is taking anything but over-the-counter fish oil for lipids.  She had the flu last month.  She denies chest pain, PND, orthopnea.  She presents for evaluation.  Past Medical History:  Diagnosis Date  . CAD (coronary artery disease) 7/12-8/12   staged LAD/RCA DES  . Diabetes mellitus ([place])   . Diastolic dysfunction 5/09   grade 1   . Dyslipidemia   . HTN (hypertension)     Past Surgical History:  Procedure Laterality Date  . PERCUTANEOUS CORONARY STENT INTERVENTION (PCI-S)  05/14/10   LAD  . PERCUTANEOUS CORONARY STENT INTERVENTION (PCI-S)  05/17/10   RCA    Allergies  Allergen Reactions  . Lisinopril     Cough  . Sulfa Antibiotics Hives    Current Outpatient Prescriptions  Medication Sig Dispense Refill  . acarbose (PRECOSE) 50 MG tablet Take 1 tablet by mouth 2 (two) times daily.     Marland Kitchen aspirin 81 MG tablet Take 81 mg by mouth 3 (three) times daily.    Marland Kitchen B  Complex-C (SUPER B COMPLEX) TABS Take 1 tablet by mouth daily. Reported on 03/24/2016    . benzonatate (TESSALON PERLES) 100 MG capsule Take 1 capsule (100 mg total) by mouth 3 (three) times daily as needed for cough. 20 capsule 0  . Biotin 5000 MCG CAPS Take 1 capsule by mouth daily.    . Calcium-Magnesium-Vitamin D (CALCIUM 500 PO) Take 1 tablet by mouth daily.    . carvedilol (COREG) 12.5 MG tablet Take 1 tablet (12.5 mg total) by mouth 2 (two) times daily. 180 tablet 0  . Cholecalciferol (VITAMIN D-3) 1000 UNITS CAPS Take 1 capsule by mouth daily.     . clopidogrel (PLAVIX) 75 MG tablet Take 1 tablet (75 mg total) by mouth daily. Needs appointment 30 tablet 3  . exenatide (BYETTA) 10 MCG/0.04ML SOPN injection Inject 10 mcg into the skin 2 (two) times daily with a meal.    . fish oil-omega-3 fatty acids 1000 MG capsule Take 1 g by mouth daily.    Marland Kitchen gabapentin (NEURONTIN) 300 MG capsule Take 300 mg by mouth 3 (three) times daily as needed. Reported on 03/24/2016    . glimepiride (AMARYL) 1 MG tablet Take 1 tablet by mouth 2 (two) times daily.    Marland Kitchen LANTUS SOLOSTAR 100 UNIT/ML SOPN Inject 50 mLs into the skin daily.     Marland Kitchen losartan-hydrochlorothiazide (HYZAAR) 50-12.5 MG tablet Take 1 tablet by mouth daily. 30 tablet 0  . metFORMIN (GLUCOPHAGE) 1000 MG tablet Take 500 mg by mouth 2 (two) times daily with a meal.     . miglitol (GLYSET) 25 MG tablet Take 1 tablet by mouth daily.    . naproxen (NAPROSYN) 500 MG tablet Take 1 tablet (500 mg total) by mouth 2 (two) times daily with a meal. 14 tablet 0  . ondansetron (ZOFRAN ODT) 4 MG disintegrating tablet Take 1 tablet (4 mg total) by mouth every 8 (eight) hours as needed for nausea or vomiting. 10 tablet 0  . pantoprazole (PROTONIX) 40 MG tablet Take 1 tablet by mouth daily. Reported on 03/24/2016    . pioglitazone (ACTOS) 15 MG tablet Take 1 tablet by mouth daily.     No current facility-administered medications for this visit.     Social History   Social History  . Marital status: Married    Spouse name: N/A  . Number of children: N/A  . Years of education: N/A   Occupational History  . Not on file.   Social History Main Topics  . Smoking status: Never Smoker  . Smokeless tobacco: Never Used  . Alcohol use Yes     Comment: Occasionally-very rare  . Drug use: Unknown  . Sexual activity: Not on file   Other Topics Concern  . Not on file   Social History Narrative  . No narrative on file    ROS General: Negative; No fevers, chills, or night sweats; no significant weight loss  from her last office visit and in fact, a 9 pound weight gain HEENT: Negative; No changes in vision or hearing, sinus congestion, difficulty swallowing Pulmonary: Negative; No cough, wheezing, shortness of breath, hemoptysis Cardiovascular: See history of present illness GI: Negative; No nausea, vomiting, diarrhea, or abdominal pain GU: Negative; No dysuria, hematuria, or difficulty voiding Musculoskeletal: Negative; no myalgias, joint pain, or weakness Hematologic/Oncology: Negative; no easy bruising, bleeding Endocrine: Positive for diabetes; no heat/cold intolerance; Neuro: Negative; no changes in balance, headaches Skin: Negative; No rashes or skin lesions Psychiatric: Negative; No behavioral problems, depression  Sleep: Negative; No snoring, daytime sleepiness, hypersomnolence, bruxism, restless legs, hypnogognic hallucinations, no cataplexy Other comprehensive 14 point system review is negative.   PE BP 110/68   Pulse 72   Ht '5\' 4"'$  (1.626 m)   Wt 200 lb (90.7 kg)   BMI 34.33 kg/m    Wt Readings from Last 3 Encounters:  12/21/16 200 lb (90.7 kg)  08/24/15 204 lb 1.6 oz (92.6 kg)  02/18/14 208 lb 3.2 oz (94.4 kg)   General: Alert, oriented, no distress.  Skin: normal turgor, no rashes HEENT: Normocephalic, atraumatic. Pupils round and reactive; sclera anicteric;no lid lag.  Nose without nasal septal hypertrophy Mouth/Parynx benign; Mallinpatti scale 3 Neck: No JVD, no carotid bruits with normal carotid upstroke Lungs: clear to ausculatation and percussion; no wheezing or rales Chest wall: Nontender to palpation Heart: RRR, s1 s2 normal 1/6 systolic murmur; no diastolic murmur.  No rubs, thrills or heaves. Abdomen: Moderate central adiposity soft, nontender; no hepatosplenomehaly, BS+; abdominal aorta nontender and not dilated by palpation. Back: No CVA tenderness Pulses 2+ Extremities: no clubbing cyanosis or edema, Homan's sign negative  Neurologic: grossly  nonfocal Psychologic: Normal affect and mood  ECG (independently read by me): Normal sinus rhythm at 72 bpm.  No ectopy.  Normal intervals.  November 2016 ECG (independently read by me): Normal sinus rhythm at 85 bpm.  Normal intervals.  No significant ST segment changes.   May 2015 ECG (independently read by me): Normal sinus rhythm at 74 beats per minute.  Mild T wave changes V1, V2, and V3.  LABS:  BMP Latest Ref Rng & Units 12/21/2016 11/13/2016 08/24/2015  Glucose 65 - 99 mg/dL 216(H) 214(H) 176(H)  BUN 7 - 25 mg/dL '18 11 15  '$ Creatinine 0.50 - 0.99 mg/dL 0.76 0.75 0.68  Sodium 135 - 146 mmol/L 137 132(L) 137  Potassium 3.5 - 5.3 mmol/L 4.3 3.9 4.6  Chloride 98 - 110 mmol/L 99 95(L) 96(L)  CO2 20 - 31 mmol/L '29 26 28  '$ Calcium 8.6 - 10.4 mg/dL 9.2 9.0 10.0   Hepatic Function Latest Ref Rng & Units 12/21/2016 11/13/2016 08/24/2015  Total Protein 6.1 - 8.1 g/dL 7.1 7.3 7.0  Albumin 3.6 - 5.1 g/dL 4.1 3.7 4.1  AST 10 - 35 U/L '14 19 16  '$ ALT 6 - 29 U/L '22 29 26  '$ Alk Phosphatase 33 - 130 U/L 43 44 47  Total Bilirubin 0.2 - 1.2 mg/dL 0.4 0.9 0.4   CBC Latest Ref Rng & Units 12/21/2016 11/13/2016 08/24/2015  WBC 3.8 - 10.8 K/uL 4.9 6.1 6.3  Hemoglobin 11.7 - 15.5 g/dL 12.9 12.9 13.7  Hematocrit 35.0 - 45.0 % 38.6 39.1 41.2  Platelets 140 - 400 K/uL 236 197 250   Lab Results  Component Value Date   MCV 87.5 12/21/2016   MCV 87.1 11/13/2016   MCV 87.5 08/24/2015   Lab Results  Component Value Date   TSH 1.45 12/21/2016    Lipid Panel     Component Value Date/Time   CHOL 176 12/21/2016 0912   CHOL 122 02/07/2014 0902   TRIG 128 12/21/2016 0912   TRIG 160 (H) 02/07/2014 0902   HDL 49 (L) 12/21/2016 0912   HDL 46 02/07/2014 0902   CHOLHDL 3.6 12/21/2016 0912   VLDL 26 12/21/2016 0912   LDLCALC 101 (H) 12/21/2016 0912   LDLCALC 44 02/07/2014 0902    RADIOLOGY: No results found.   IMPRESSION:  1. CAD in native artery   2. Essential hypertension  3. Type 2 diabetes  mellitus without complication, with long-term current use of insulin ([place])   4. Dyslipidemia   5. Medication management   6. Gastroesophageal reflux disease without esophagitis      ASSESSMENT AND PLAN: Diana [name] is a 71 years old who is status post two-vessel intervention including a 95% ostial LAD stenosis with PTCA stenting and tandem stenting to her RCA in 2011. Her last nuclear perfusion study in 2014 remained low risk.  Her blood pressure today is stable on her current dose of losartan HCT 50/12.5 and carvedilol 12.5 twice a day.  She denies any anginal symptoms and is not symptomatic with CH.  She is diabetic and is on 4 different medications, but no longer is taking insulin.  She is followed by Dr. Tamsen Roers who has been adjusting her diabetic regimen.  Her GERD has been stable.  She has not been on lipid lowering therapy with the exception of over-the-counter omega-3 fatty acids.  A complete set of fasting lab work will be obtained including a CMET, age, CBC, lipid panel, and hemoglobin A1c.  She will be restarted on lipid lowering therapy in the results if LDL is greater than 70.  Her medical regimen will be made if necessary.  I will see her in 6 months for reevaluation.  Time spent: 25 minutes  Troy Sine, MD, [facility]  12/23/2016 6:48 PM

## 2016-12-29 ENCOUNTER — Other Ambulatory Visit: Payer: Self-pay | Admitting: *Deleted

## 2016-12-29 ENCOUNTER — Telehealth: Payer: Self-pay | Admitting: *Deleted

## 2016-12-29 DIAGNOSIS — Z79899 Other long term (current) drug therapy: Secondary | ICD-10-CM

## 2016-12-29 DIAGNOSIS — E785 Hyperlipidemia, unspecified: Secondary | ICD-10-CM

## 2016-12-29 MED ORDER — EZETIMIBE 10 MG PO TABS: 10.0000 mg | ORAL_TABLET | Freq: Every day | ORAL | 3 refills | Status: DC

## 2016-12-29 NOTE — Telephone Encounter (Signed)
-----   Message from Lennette Biharihomas A Kelly, MD sent at 12/25/2016  2:23 PM EDT ----- Needs control of diabetes mellitus.  Show primary M.D. ,  Start Crestor 20 mg if able to tolerate statin.  If not able to tolerate statin then Zetia 10 mg.

## 2016-12-29 NOTE — Telephone Encounter (Signed)
Patient notified of lab results and recommendations. She wants to try the zetia. Informs me that she has tried both in the past. States the crestor caused her to have elevated LFT's. The zetia was too expensive. After informing her the zetia is now generic she agrees to try it. Lab order sent to patient to get repeat labs in 6-8 weeks after starting the zetia.

## 2017-01-04 ENCOUNTER — Other Ambulatory Visit: Payer: Self-pay

## 2017-01-04 MED ORDER — LOSARTAN POTASSIUM-HCTZ 50-12.5 MG PO TABS: 1.0000 | ORAL_TABLET | Freq: Every day | ORAL | 11 refills | Status: DC

## 2017-01-16 ENCOUNTER — Other Ambulatory Visit: Payer: Self-pay | Admitting: Cardiovascular Disease

## 2017-01-16 NOTE — Telephone Encounter (Signed)
Rx request sent to pharmacy.  

## 2017-02-07 ENCOUNTER — Other Ambulatory Visit: Payer: Self-pay

## 2017-02-07 MED ORDER — CLOPIDOGREL BISULFATE 75 MG PO TABS: 75.0000 mg | ORAL_TABLET | Freq: Every day | ORAL | 11 refills | Status: DC

## 2017-02-09 ENCOUNTER — Other Ambulatory Visit: Payer: Self-pay | Admitting: Cardiovascular Disease

## 2017-02-09 MED ORDER — CLOPIDOGREL BISULFATE 75 MG PO TABS: 75.0000 mg | ORAL_TABLET | Freq: Every day | ORAL | 5 refills | Status: DC

## 2017-02-09 NOTE — Telephone Encounter (Signed)
Sent to correct pharmacy and updated this for her file.

## 2017-02-09 NOTE — Telephone Encounter (Signed)
Her Clopidogrel was sent to the wrong pharmacy.Please send it to [facility] [phone].

## 2017-04-26 ENCOUNTER — Other Ambulatory Visit: Payer: Self-pay | Admitting: Cardiovascular Disease

## 2017-07-07 ENCOUNTER — Other Ambulatory Visit: Payer: Self-pay

## 2017-07-07 MED ORDER — CARVEDILOL 12.5 MG PO TABS
12.5000 mg | ORAL_TABLET | Freq: Two times a day (BID) | ORAL | 2 refills | Status: DC
Start: 2017-07-07 — End: 2018-04-26

## 2017-08-18 ENCOUNTER — Other Ambulatory Visit: Payer: Self-pay | Admitting: Cardiovascular Disease

## 2017-08-18 ENCOUNTER — Other Ambulatory Visit: Payer: Self-pay

## 2017-08-18 MED ORDER — CLOPIDOGREL BISULFATE 75 MG PO TABS: 75.0000 mg | ORAL_TABLET | Freq: Every day | ORAL | 0 refills | Status: DC

## 2017-08-18 NOTE — Telephone Encounter (Signed)
New message       *STAT* If patient is at the pharmacy, call can be transferred to refill team.   1. Which medications need to be refilled? (please list name of each medication and dose if known) clopidogrel 75 mg  2. Which pharmacy/location (including street and city if local pharmacy) is medication to be sent to? [facility]   3. Do they need a 30 day or 90 day supply? 30 day

## 2017-08-18 NOTE — Telephone Encounter (Signed)
Rx(s) sent to pharmacy electronically.  

## 2017-09-19 ENCOUNTER — Other Ambulatory Visit: Payer: Self-pay | Admitting: *Deleted

## 2017-09-19 MED ORDER — CLOPIDOGREL BISULFATE 75 MG PO TABS: 75.0000 mg | ORAL_TABLET | Freq: Every day | ORAL | 4 refills | Status: DC

## 2017-10-22 ENCOUNTER — Other Ambulatory Visit: Payer: Self-pay | Admitting: Cardiovascular Disease

## 2017-10-23 NOTE — Telephone Encounter (Signed)
REFILL 

## 2017-11-17 LAB — COMPLETE METABOLIC PANEL WITH GFR
AG RATIO: 1.7 (calc) (ref 1.0–2.5)
ALBUMIN MSPROF: 4.3 g/dL (ref 3.6–5.1)
ALKALINE PHOSPHATASE (APISO): 45 U/L (ref 33–130)
ALT: 25 U/L (ref 6–29)
AST: 11 U/L (ref 10–35)
BILIRUBIN TOTAL: 0.4 mg/dL (ref 0.2–1.2)
BUN: 22 mg/dL (ref 7–25)
CHLORIDE: 100 mmol/L (ref 98–110)
CO2: 29 mmol/L (ref 20–32)
Calcium: 9.2 mg/dL (ref 8.6–10.4)
Creat: 0.77 mg/dL (ref 0.50–0.99)
GFR, EST AFRICAN AMERICAN: 91 mL/min/{1.73_m2} (ref >=60)
GFR, Est Non African American: 79 mL/min/{1.73_m2} (ref >=60)
Globulin: 2.6 g/dL (calc) (ref 1.9–3.7)
Glucose, Bld: 218 mg/dL — ABNORMAL HIGH (ref 65–99)
POTASSIUM: 4.4 mmol/L (ref 3.5–5.3)
Sodium: 136 mmol/L (ref 135–146)
TOTAL PROTEIN: 6.9 g/dL (ref 6.1–8.1)

## 2017-11-17 LAB — LIPID PANEL
CHOL/HDL RATIO: 3.6 (calc) (ref <5.0)
CHOLESTEROL: 164 mg/dL (ref <200)
HDL: 46 mg/dL — ABNORMAL LOW (ref >50)
LDL Cholesterol (Calc): 94 mg/dL (calc)
Non-HDL Cholesterol (Calc): 118 mg/dL (calc) (ref <130)
Triglycerides: 140 mg/dL (ref <150)

## 2017-11-24 ENCOUNTER — Ambulatory Visit: Payer: Medicare HMO | Admitting: Cardiovascular Disease

## 2017-11-24 ENCOUNTER — Encounter: Payer: Self-pay | Admitting: *Deleted

## 2017-11-24 ENCOUNTER — Encounter: Payer: Self-pay | Admitting: Cardiovascular Disease

## 2017-11-24 VITALS — BP 142/66 | HR 72 | Ht 64.0 in | Wt 205.4 lb

## 2017-11-24 DIAGNOSIS — E669 Obesity, unspecified: Secondary | ICD-10-CM

## 2017-11-24 DIAGNOSIS — Z79899 Other long term (current) drug therapy: Secondary | ICD-10-CM

## 2017-11-24 DIAGNOSIS — K219 Gastro-esophageal reflux disease without esophagitis: Secondary | ICD-10-CM

## 2017-11-24 DIAGNOSIS — E668 Other obesity: Secondary | ICD-10-CM | POA: Diagnosis not present

## 2017-11-24 DIAGNOSIS — I10 Essential (primary) hypertension: Secondary | ICD-10-CM | POA: Diagnosis not present

## 2017-11-24 DIAGNOSIS — E785 Hyperlipidemia, unspecified: Secondary | ICD-10-CM

## 2017-11-24 DIAGNOSIS — E118 Type 2 diabetes mellitus with unspecified complications: Secondary | ICD-10-CM

## 2017-11-24 DIAGNOSIS — I2510 Atherosclerotic heart disease of native coronary artery without angina pectoris: Secondary | ICD-10-CM | POA: Diagnosis not present

## 2017-11-24 MED ORDER — NITROGLYCERIN 0.4 MG SL SUBL: 0.4000 mg | SUBLINGUAL_TABLET | SUBLINGUAL | 3 refills | Status: DC | PRN

## 2017-11-24 MED ORDER — ROSUVASTATIN CALCIUM 10 MG PO TABS: 10.0000 mg | ORAL_TABLET | ORAL | 3 refills | Status: DC

## 2017-11-24 NOTE — Addendum Note (Signed)
Addended by: Chana BodeGREEN, Shakala Marlatt L on: 11/24/2017 09:28 AM   Modules accepted: Orders

## 2017-11-24 NOTE — Patient Instructions (Signed)
Medication Instructions:  START rosuvastatin (Crestor) 10 mg once weekly  Labwork: Please return for FASTING labs in 2 months (CMET,Lipid)-lab orders provided.  Follow-Up: Your physician wants you to follow-up in: 6 months with Dr. Tresa EndoKelly.  You will receive a reminder letter in the mail two months in advance. If you don't receive a letter, please call our office to schedule the follow-up appointment.   Any Other Special Instructions Will Be Listed Below (If Applicable).     If you need a refill on your cardiac medications before your next appointment, please call your pharmacy.

## 2017-11-24 NOTE — Progress Notes (Addendum)
Patient ID: Diana [name], female   DOB: 1946-10-11, 72 y.o.   MRN: [id number]      HPI: Diana [name] is a 72 y.o. female presents to the office today for an 27 month cardiology follow up evaluation.  Diana [name] has CAD and in July/August 2011 she underwent staged intervention with PTCA and stenting of a 95% ostial LAD stenosis (3.5x12 mm promiscuity a stent) postdilated 3.75 mm, end-stage intervention to) artery with tandem 3.0x28 and 2.5x23 mm promiscuity. She has a history of hyperlipidemia. In January 2012 while on Crestor she developed mild transaminase elevation leading to its discontinuance.  She has a history of diabetes mellitus which has been poorly controlled, GERD,  remote history of pneumonia. An echo Doppler study in March 2014 showed normal systolic function with grade 1 diastolic dysfunction. She did have mild aortic sclerosis with mild MR and mild LA dilation. I scheduled her for a two-year followup nuclear perfusion study but insurance I denied this.  Laboratory in April 2014 showed a glucose of 169. At that time, her cholesterol was 166 LDL 81 triglycerides were 192 and hemoglobin A1c was 9.0. Her primary physician is increased her insulin. An NMR evaluation showed an LDL cholesterol of 70 but due to her increased small LDL particles and 1086, LDL particle number was still significantly increased at 1383. HDL was 44 triglycerides are still elevated at 180. Recent hemoglobin A1c was 87. ,  She was started on Zetia, which she had tolerated.  Diana [name] underwent a nuclear perfusion study on 06/04/2013 which continued to show normal perfusion and function. Ejection fraction was 70%. There was no evidence for scar or ischemia.    When  I last saw her in March 2018  she was without chest pain or shortness of breath.  She was no longer taking insulin for her diabetes mellitus.  Over the past year, she has remained chest pain-free.  Her glucose has been increased.  She has been on Zetia 10  mg at fish oil for hyperlipidemia.  Recent lipid panel 1 week ago showed a total cholesterol 164, HDL 46, triglycerides 140, and LDL 94.  She has continued to take aspirin and Plavix.  She is on losartan HCT 50/12.5 and carvedilol 12.5 mm twice a day for blood pressure and her CAD.  She is on metformin 500 mg twice a day, glimepiride 1 mg twice a day, and Actos 15 mg for diabetes mellitus.  She has a peripheral neuropathy on gabapentin.  GERD is controlled with Protonix.  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  She obtains fiber, and is involved in breeding, boarding, and rescues other animals.  She presents for evaluation.  Past Medical History:  Diagnosis Date  . CAD (coronary artery disease) 7/12-8/12   staged LAD/RCA DES  . Diabetes mellitus ([place])   . Diastolic dysfunction 0/25   grade 1   . Dyslipidemia   . HTN (hypertension)     Past Surgical History:  Procedure Laterality Date  . PERCUTANEOUS CORONARY STENT INTERVENTION (PCI-S)  05/14/10   LAD  . PERCUTANEOUS CORONARY STENT INTERVENTION (PCI-S)  05/17/10   RCA    Allergies  Allergen Reactions  . Lisinopril     Cough  . Rosuvastatin   . Sulfa Antibiotics Hives    Current Outpatient Medications  Medication Sig Dispense Refill  . acarbose (PRECOSE) 50 MG tablet Take 1 tablet by mouth 2 (two) times daily.     Marland Kitchen  aspirin 81 MG tablet Take 81 mg by mouth 3 (three) times daily.    . B Complex-C (SUPER B COMPLEX) TABS Take 1 tablet by mouth daily. Reported on 03/24/2016    . benzonatate (TESSALON PERLES) 100 MG capsule Take 1 capsule (100 mg total) by mouth 3 (three) times daily as needed for cough. 20 capsule 0  . Biotin 5000 MCG CAPS Take 1 capsule by mouth daily.    . Calcium-Magnesium-Vitamin D (CALCIUM 500 PO) Take 1 tablet by mouth daily.    . carvedilol (COREG) 12.5 MG tablet Take 1 tablet (12.5 mg total) by mouth 2 (two) times daily. 180 tablet 2  . Cholecalciferol (VITAMIN D-3) 1000  UNITS CAPS Take 1 capsule by mouth daily.    . clopidogrel (PLAVIX) 75 MG tablet Take 1 tablet (75 mg total) by mouth daily. 30 tablet 4  . ezetimibe (ZETIA) 10 MG tablet TAKE 1 TABLET BY MOUTH EVERY DAY 30 tablet 3  . fish oil-omega-3 fatty acids 1000 MG capsule Take 1 g by mouth daily.    Marland Kitchen gabapentin (NEURONTIN) 300 MG capsule Take 300 mg by mouth 3 (three) times daily as needed. Reported on 03/24/2016    . glimepiride (AMARYL) 1 MG tablet Take 1 tablet by mouth 2 (two) times daily.    Marland Kitchen losartan-hydrochlorothiazide (HYZAAR) 50-12.5 MG tablet Take 1 tablet by mouth daily. 30 tablet 11  . meloxicam (MOBIC) 15 MG tablet Take 1 tablet by mouth as directed.    . metFORMIN (GLUCOPHAGE) 1000 MG tablet Take 500 mg by mouth 2 (two) times daily with a meal.     . naproxen (NAPROSYN) 500 MG tablet Take 1 tablet (500 mg total) by mouth 2 (two) times daily with a meal. 14 tablet 0  . ondansetron (ZOFRAN ODT) 4 MG disintegrating tablet Take 1 tablet (4 mg total) by mouth every 8 (eight) hours as needed for nausea or vomiting. 10 tablet 0  . pantoprazole (PROTONIX) 40 MG tablet Take 1 tablet by mouth daily. Reported on 03/24/2016    . pioglitazone (ACTOS) 15 MG tablet Take 1 tablet by mouth daily.    . nitroGLYCERIN (NITROSTAT) 0.4 MG SL tablet Place 1 tablet (0.4 mg total) under the tongue every 5 (five) minutes as needed for chest pain. 25 tablet 3  . rosuvastatin (CRESTOR) 10 MG tablet Take 1 tablet (10 mg total) by mouth once a week. 12 tablet 3   No current facility-administered medications for this visit.     Social History   Socioeconomic History  . Marital status: Married    Spouse name: Not on file  . Number of children: Not on file  . Years of education: Not on file  . Highest education level: Not on file  Social Needs  . Financial resource strain: Not on file  . Food insecurity - worry: Not on file  . Food insecurity - inability: Not on file  . Transportation needs - medical: Not on file    . Transportation needs - non-medical: Not on file  Occupational History  . Not on file  Tobacco Use  . Smoking status: Never Smoker  . Smokeless tobacco: Never Used  Substance and Sexual Activity  . Alcohol use: Yes    Comment: Occasionally-very rare  . Drug use: Not on file  . Sexual activity: Not on file  Other Topics Concern  . Not on file  Social History Narrative  . Not on file    ROS General: Negative; No fevers, chills,  or night sweats;  HEENT: Negative; No changes in vision or hearing, sinus congestion, difficulty swallowing Pulmonary: Negative; No cough, wheezing, shortness of breath, hemoptysis Cardiovascular: See history of present illness GI: Negative; No nausea, vomiting, diarrhea, or abdominal pain GU: Negative; No dysuria, hematuria, or difficulty voiding Musculoskeletal: Negative; no myalgias, joint pain, or weakness Hematologic/Oncology: Negative; no easy bruising, bleeding Endocrine: Positive for diabetes; no heat/cold intolerance; Neuro: Negative; no changes in balance, headaches Skin: Negative; No rashes or skin lesions Psychiatric: Negative; No behavioral problems, depression Sleep: Negative; No snoring, daytime sleepiness, hypersomnolence, bruxism, restless legs, hypnogognic hallucinations, no cataplexy Other comprehensive 14 point system review is negative.   PE BP (!) 142/66   Pulse 72   Ht _0  (1.626 m)   Wt 205 lb 6.4 oz (93.2 kg)   BMI 35.26 kg/m    Repeat blood pressure by me was 126/68  Wt Readings from Last 3 Encounters:  11/24/17 205 lb 6.4 oz (93.2 kg)  12/21/16 200 lb (90.7 kg)  08/24/15 204 lb 1.6 oz (92.6 kg)   General: Alert, oriented, no distress.  Skin: normal turgor, no rashes, warm and dry HEENT: Normocephalic, atraumatic. Pupils equal round and reactive to light; sclera anicteric; extraocular muscles intact;  Nose without nasal septal hypertrophy Mouth/Parynx benign; Mallinpatti scale 3 Neck: No JVD, no carotid bruits;  normal carotid upstroke Lungs: clear to ausculatation and percussion; no wheezing or rales Chest wall: without tenderness to palpitation Heart: PMI not displaced, RRR, s1 s2 normal, 1/6 systolic murmur, no diastolic murmur, no rubs, gallops, thrills, or heaves Abdomen: Central adiposity;soft, nontender; no hepatosplenomehaly, BS+; abdominal aorta nontender and not dilated by palpation. Back: no CVA tenderness Pulses 2+ Musculoskeletal: full range of motion, normal strength, no joint deformities Extremities: no clubbing cyanosis or edema, Homan's sign negative  Neurologic: grossly nonfocal; Cranial nerves grossly wnl Psychologic: Normal mood and affect   ECG (independently read by me): Normal sinus rhythm at 72 bpm.  Borderline left atrial enlargement.  No ST segment changes.  Normal intervals.  March 2018 ECG (independently read by me): Normal sinus rhythm at 72 bpm.  No ectopy.  Normal intervals.  November 2016 ECG (independently read by me): Normal sinus rhythm at 85 bpm.  Normal intervals.  No significant ST segment changes.   May 2015 ECG (independently read by me): Normal sinus rhythm at 74 beats per minute.  Mild T wave changes V1, V2, and V3.  LABS:  BMP Latest Ref Rng & Units 11/17/2017 12/21/2016 11/13/2016  Glucose 65 - 99 mg/dL 218(H) 216(H) 214(H)  BUN 7 - 25 mg/dL _1 Creatinine 0.50 - 0.99 mg/dL 0.77 0.76 0.75  BUN/Creat Ratio 6 - 22 (calc) NOT APPLICABLE - -  Sodium 540 - 146 mmol/L 136 137 132(L)  Potassium 3.5 - 5.3 mmol/L 4.4 4.3 3.9  Chloride 98 - 110 mmol/L 100 99 95(L)  CO2 20 - 32 mmol/L _2 Calcium 8.6 - 10.4 mg/dL 9.2 9.2 9.0   Hepatic Function Latest Ref Rng & Units 11/17/2017 12/21/2016 11/13/2016  Total Protein 6.1 - 8.1 g/dL 6.9 7.1 7.3  Albumin 3.6 - 5.1 g/dL - 4.1 3.7  AST 10 - 35 U/L _3 ALT 6 - 29 U/L _4 Alk Phosphatase 33 - 130 U/L - 43 44  Total Bilirubin 0.2 - 1.2 mg/dL 0.4 0.4 0.9   CBC Latest Ref Rng & Units 12/21/2016  11/13/2016 08/24/2015  WBC 3.8 - 10.8 K/uL 4.9  6.1 6.3  Hemoglobin 11.7 - 15.5 g/dL 12.9 12.9 13.7  Hematocrit 35.0 - 45.0 % 38.6 39.1 41.2  Platelets 140 - 400 K/uL 236 197 250   Lab Results  Component Value Date   MCV 87.5 12/21/2016   MCV 87.1 11/13/2016   MCV 87.5 08/24/2015   Lab Results  Component Value Date   TSH 1.45 12/21/2016    Lipid Panel     Component Value Date/Time   CHOL 164 11/17/2017 0833   CHOL 122 02/07/2014 0902   TRIG 140 11/17/2017 0833   TRIG 160 (H) 02/07/2014 0902   HDL 46 (L) 11/17/2017 0833   HDL 46 02/07/2014 0902   CHOLHDL 3.6 11/17/2017 0833   VLDL 26 12/21/2016 0912   LDLCALC 101 (H) 12/21/2016 0912   LDLCALC 44 02/07/2014 0902    RADIOLOGY: No results found.   IMPRESSION:  1. CAD in native artery: s/p PCI toLAD, RCA 2011   2. Medication management   3. Essential hypertension   4. Hyperlipidemia with target LDL less than 70   5. Moderate obesity   6. Type 2 diabetes mellitus with complication, without long-term current use of insulin (HCC)   7. Gastroesophageal reflux disease without esophagitis     ASSESSMENT AND PLAN: Diana [name] is a 72 years old female who underwent two-vessel intervention including a 95% ostial LAD stenosis with PTCA stenting and tandem stenting to her RCA in 2011. Her last nuclear perfusion study in 2014 remained low risk.  .  She has a history of moderate obesity, IVs mellitus, peripheral neuropathy,.  Her blood pressure today on repeat by me was improved at 126/68.  He has continued to be on carvedilol 12.5 twice a day in addition to losartan HCT 50/12.5.  Laboratory has shown normal renal function.  Remotely, she developed mild transaminase elevation on Crestor.  Most recently she has been taking Zetia 10 mg and over-the-counter fish oil.  Her triglycerides have improved.  Most recent lipid panel shows an LDL of 94.  With her CAD history, as well as diabetes mellitus, target LDL is less than 70.  I have suggested a  slight rechallenge of rosuvastatin and have given her prescription for 10 mg to initiate therapy.  Adjust 1 day per week.  In 2 months.  Will recheck chemistry as well as lipid studies.  If her LDL cholesterol is under 70.  She will continue current regimen.  However, if LDL cholesterol is still elevated and is always her LFTs are not increased and she is tolerating it, we will then attempt further titration to more frequent dosing.  We discussed the importance of exercise and weight loss.  This index is 35.26.  She is on 3 drug regimen for her diabetes mellitus.  She is not having any heart failure symptoms.  Most recent fasting glucose was elevated at 218.  She potentially may be a candidate for SGL2 inhibition , which will be beneficial both for diabetes and heart.  Her GERD is controlled with pantoprazole.  I will see her in 6 months for reevaluation.  Time spent: 25 minutes  Troy Sine, MD, [facility]  11/24/2017 8:24 AM

## 2018-01-23 ENCOUNTER — Other Ambulatory Visit: Payer: Self-pay | Admitting: *Deleted

## 2018-01-23 ENCOUNTER — Other Ambulatory Visit: Payer: Self-pay | Admitting: Cardiovascular Disease

## 2018-01-23 MED ORDER — LOSARTAN POTASSIUM-HCTZ 50-12.5 MG PO TABS: 1.0000 | ORAL_TABLET | Freq: Every day | ORAL | 3 refills | Status: DC

## 2018-01-23 NOTE — Telephone Encounter (Signed)
New Message   *STAT* If patient is at the pharmacy, call can be transferred to refill team.   1. Which medications need to be refilled? (please list name of each medication and dose if known) losartan-hydrochlorothiazide (HYZAAR) 50-12.5 MG tablet  2. Which pharmacy/location (including street and city if local pharmacy) is medication to be sent to? [facility] - [place] [place] - [address]  3. Do they need a 30 day or 90 day supply?

## 2018-01-23 NOTE — Telephone Encounter (Signed)
Spoke with patient who reports pharmacy has sent over 3 refill requests for this medication. Rx(s) sent to pharmacy electronically.

## 2018-02-23 ENCOUNTER — Other Ambulatory Visit: Payer: Self-pay | Admitting: *Deleted

## 2018-02-23 MED ORDER — CLOPIDOGREL BISULFATE 75 MG PO TABS: 75.0000 mg | ORAL_TABLET | Freq: Every day | ORAL | 4 refills | Status: DC

## 2018-04-24 ENCOUNTER — Other Ambulatory Visit: Payer: Self-pay | Admitting: Cardiovascular Disease

## 2018-04-24 NOTE — Telephone Encounter (Signed)
Rx sent to pharmacy   

## 2018-04-26 ENCOUNTER — Other Ambulatory Visit: Payer: Self-pay | Admitting: Cardiovascular Disease

## 2018-07-21 ENCOUNTER — Other Ambulatory Visit: Payer: Self-pay | Admitting: Cardiovascular Disease

## 2018-07-23 ENCOUNTER — Other Ambulatory Visit: Payer: Self-pay | Admitting: Cardiovascular Disease

## 2018-07-25 ENCOUNTER — Other Ambulatory Visit: Payer: Self-pay | Admitting: Cardiovascular Disease

## 2018-08-16 ENCOUNTER — Other Ambulatory Visit: Payer: Self-pay | Admitting: Cardiovascular Disease

## 2018-08-20 ENCOUNTER — Other Ambulatory Visit: Payer: Self-pay | Admitting: Cardiovascular Disease

## 2018-08-20 NOTE — Telephone Encounter (Signed)
Rx(s) sent to pharmacy electronically.  

## 2018-10-20 ENCOUNTER — Other Ambulatory Visit: Payer: Self-pay | Admitting: Cardiovascular Disease

## 2018-11-20 ENCOUNTER — Other Ambulatory Visit: Payer: Self-pay | Admitting: Cardiovascular Disease

## 2018-11-26 ENCOUNTER — Other Ambulatory Visit: Payer: Self-pay | Admitting: Cardiovascular Disease

## 2018-12-23 ENCOUNTER — Other Ambulatory Visit: Payer: Self-pay | Admitting: Cardiovascular Disease

## 2018-12-25 ENCOUNTER — Other Ambulatory Visit: Payer: Self-pay | Admitting: Cardiovascular Disease

## 2019-01-17 ENCOUNTER — Other Ambulatory Visit: Payer: Self-pay | Admitting: Cardiovascular Disease

## 2019-01-17 NOTE — Telephone Encounter (Signed)
Refilled Zetia.

## 2019-01-27 ENCOUNTER — Other Ambulatory Visit: Payer: Self-pay | Admitting: Cardiovascular Disease

## 2019-02-25 ENCOUNTER — Other Ambulatory Visit: Payer: Self-pay | Admitting: Cardiovascular Disease

## 2019-03-17 ENCOUNTER — Other Ambulatory Visit: Payer: Self-pay | Admitting: Cardiovascular Disease

## 2019-04-09 ENCOUNTER — Other Ambulatory Visit: Payer: Self-pay | Admitting: Cardiovascular Disease

## 2019-04-11 ENCOUNTER — Other Ambulatory Visit: Payer: Self-pay | Admitting: Cardiovascular Disease

## 2019-04-11 MED ORDER — LOSARTAN POTASSIUM-HCTZ 50-12.5 MG PO TABS: 1.0000 | ORAL_TABLET | Freq: Every day | ORAL | 0 refills | Status: DC

## 2019-07-16 ENCOUNTER — Other Ambulatory Visit: Payer: Self-pay | Admitting: Cardiovascular Disease

## 2019-10-01 ENCOUNTER — Other Ambulatory Visit: Payer: Self-pay | Admitting: Cardiovascular Disease

## 2019-10-24 ENCOUNTER — Other Ambulatory Visit: Payer: Self-pay | Admitting: Cardiovascular Disease

## 2019-10-25 ENCOUNTER — Other Ambulatory Visit: Payer: Self-pay | Admitting: Cardiovascular Disease

## 2019-10-26 ENCOUNTER — Other Ambulatory Visit: Payer: Self-pay | Admitting: Cardiovascular Disease

## 2019-10-29 ENCOUNTER — Other Ambulatory Visit: Payer: Self-pay | Admitting: *Deleted

## 2019-10-29 DIAGNOSIS — E785 Hyperlipidemia, unspecified: Secondary | ICD-10-CM

## 2019-10-29 DIAGNOSIS — I10 Essential (primary) hypertension: Secondary | ICD-10-CM

## 2019-10-29 DIAGNOSIS — I2510 Atherosclerotic heart disease of native coronary artery without angina pectoris: Secondary | ICD-10-CM

## 2019-10-29 LAB — COMPREHENSIVE METABOLIC PANEL
ALT: 26 IU/L (ref 0–32)
AST: 16 IU/L (ref 0–40)
Albumin/Globulin Ratio: 1.5 (ref 1.2–2.2)
Albumin: 4.4 g/dL (ref 3.7–4.7)
Alkaline Phosphatase: 52 IU/L (ref 39–117)
BUN/Creatinine Ratio: 21 (ref 12–28)
BUN: 17 mg/dL (ref 8–27)
Bilirubin Total: 0.2 mg/dL (ref 0.0–1.2)
CO2: 24 mmol/L (ref 20–29)
Calcium: 9.6 mg/dL (ref 8.7–10.3)
Chloride: 96 mmol/L (ref 96–106)
Creatinine, Ser: 0.82 mg/dL (ref 0.57–1.00)
GFR calc Af Amer: 83 mL/min/{1.73_m2} (ref >59)
GFR calc non Af Amer: 72 mL/min/{1.73_m2} (ref >59)
Globulin, Total: 2.9 g/dL (ref 1.5–4.5)
Glucose: 163 mg/dL — ABNORMAL HIGH (ref 65–99)
Potassium: 4.5 mmol/L (ref 3.5–5.2)
Sodium: 137 mmol/L (ref 134–144)
Total Protein: 7.3 g/dL (ref 6.0–8.5)

## 2019-10-29 LAB — LIPID PANEL
Chol/HDL Ratio: 2.8 ratio (ref 0.0–4.4)
Cholesterol, Total: 119 mg/dL (ref 100–199)
HDL: 43 mg/dL (ref >39)
LDL Chol Calc (NIH): 45 mg/dL (ref 0–99)
Triglycerides: 190 mg/dL — ABNORMAL HIGH (ref 0–149)
VLDL Cholesterol Cal: 31 mg/dL (ref 5–40)

## 2019-10-29 NOTE — Progress Notes (Signed)
Patient her today to have labs done prior to appointment. 11/19/19

## 2019-11-04 ENCOUNTER — Other Ambulatory Visit: Payer: Self-pay | Admitting: Cardiovascular Disease

## 2019-11-07 ENCOUNTER — Ambulatory Visit: Payer: Medicare HMO | Attending: Internal Medicine

## 2019-11-07 DIAGNOSIS — Z23 Encounter for immunization: Secondary | ICD-10-CM

## 2019-11-07 NOTE — Progress Notes (Signed)
   Covid-19 Vaccination Clinic  Name:  Angellina Ferdinand    MRN: [id number] DOB: 1945/10/24  11/07/2019  Ms. Lassalle was observed post Covid-19 immunization for 15 minutes without incidence. She was provided with Vaccine Information Sheet and instruction to access the V-Safe system.   Ms. Dattilo was instructed to call 911 with any severe reactions post vaccine: Marland Kitchen Difficulty breathing  . Swelling of your face and throat  . A fast heartbeat  . A bad rash all over your body  . Dizziness and weakness    Immunizations Administered    Name Date Dose VIS Date Route   Pfizer COVID-19 Vaccine 11/07/2019  1:35 PM 0.3 mL 09/27/2019 Intramuscular   Manufacturer: [employer], [employer]   Lot: [id number]   NDC: [address]-3

## 2019-11-19 ENCOUNTER — Other Ambulatory Visit: Payer: Self-pay

## 2019-11-19 ENCOUNTER — Encounter: Payer: Self-pay | Admitting: Cardiovascular Disease

## 2019-11-19 ENCOUNTER — Ambulatory Visit: Payer: Medicare HMO | Admitting: Cardiovascular Disease

## 2019-11-19 DIAGNOSIS — Z794 Long term (current) use of insulin: Secondary | ICD-10-CM

## 2019-11-19 DIAGNOSIS — I10 Essential (primary) hypertension: Secondary | ICD-10-CM

## 2019-11-19 DIAGNOSIS — I2510 Atherosclerotic heart disease of native coronary artery without angina pectoris: Secondary | ICD-10-CM

## 2019-11-19 DIAGNOSIS — I5189 Other ill-defined heart diseases: Secondary | ICD-10-CM | POA: Diagnosis not present

## 2019-11-19 DIAGNOSIS — E118 Type 2 diabetes mellitus with unspecified complications: Secondary | ICD-10-CM

## 2019-11-19 DIAGNOSIS — E668 Other obesity: Secondary | ICD-10-CM

## 2019-11-19 DIAGNOSIS — E785 Hyperlipidemia, unspecified: Secondary | ICD-10-CM

## 2019-11-19 NOTE — Patient Instructions (Signed)
Medication Instructions:  CONTINUE WITH CURRENT MEDICATIONS *If you need a refill on your cardiac medications before your next appointment, please call your pharmacy*  Follow-Up: At [facility], you and your health needs are our priority.  As part of our continuing mission to provide you with exceptional heart care, we have created designated Provider Care Teams.  These Care Teams include your primary Cardiologist (physician) and Advanced Practice Providers (APPs -  Physician Assistants and Nurse Practitioners) who all work together to provide you with the care you need, when you need it.  Your next appointment:   12 month(s)  The format for your next appointment:   In Person  Provider:   Thomas Kelly, MD  

## 2019-11-19 NOTE — Progress Notes (Signed)
Positive blood pressure pressures okay monitor blood sugar blood pressure sure no problem patient ID: Diana [name], female   DOB: [birth year], 73 y.o.   MRN: [id number]      HPI: Diana [name] is a 74 y.o. female presents to the office today for a 24 month cardiology follow up evaluation.  Diana [name] has CAD and in July/August 2011 she underwent staged intervention with PTCA and stenting of a 95% ostial LAD stenosis (3.5x12 mm promiscuity a stent) postdilated 3.75 mm, end-stage intervention to) artery with tandem 3.0x28 and 2.5x23 mm promiscuity. She has a history of hyperlipidemia. In January 2012 while on Crestor she developed mild transaminase elevation leading to its discontinuance.  She has a history of diabetes mellitus which has been poorly controlled, GERD,  remote history of pneumonia. An echo Doppler study in March 2014 showed normal systolic function with grade 1 diastolic dysfunction. She did have mild aortic sclerosis with mild MR and mild LA dilation. I scheduled her for a two-year followup nuclear perfusion study but insurance I denied this.  Laboratory in April 2014 showed a glucose of 169. At that time, her cholesterol was 166 LDL 81 triglycerides were 192 and hemoglobin A1c was 9.0. Her primary physician is increased her insulin. An NMR evaluation showed an LDL cholesterol of 70 but due to her increased small LDL particles and 1086, LDL particle number was still significantly increased at 1383. HDL was 44 triglycerides are still elevated at 180. Recent hemoglobin A1c was 87. ,  She was started on Zetia, which she had tolerated.  Diana [name] underwent a nuclear perfusion study on 06/04/2013 which continued to show normal perfusion and function. Ejection fraction was 70%. There was no evidence for scar or ischemia.    When I saw her in March 2018  she was without chest pain or shortness of breath.  She was no longer taking insulin for her diabetes mellitus.    I last saw her in February  2019.  At that time she remained stable and was without chest pain.   She has been on Zetia 10 mg at fish oil for hyperlipidemia.  A  lipid panel 1 week prior to that evaluation had shown a total cholesterol 164, HDL 46, triglycerides 140, and LDL 94.  She has continued to take aspirin and Plavix.  She is on losartan HCT 50/12.5 and carvedilol 12.5 mm twice a day for blood pressure and her CAD.  She was on metformin 500 mg twice a day, glimepiride 1 mg twice a day, and Actos 15 mg for diabetes mellitus.  She has a peripheral neuropathy on gabapentin.  GERD is controlled with Protonix.  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  She obtains fiber, and is involved in breeding, boarding, and rescues other animals.    Over the past 2 years, she states that she has done well and has been without any chest pain or shortness of breath.  She was now seeing Dr. Iran Planas for endocrinology and continues to be on metformin, glimepiride and was recently started on Ozempic.  She was no longer on pioglitazone.  She has continued to be on Zetia and rosuvastatin 10 mg for hyperlipidemia.  She has continue DAPT with aspirin/Plavix.  She has noticed rare episodes of lightheadedness but denies any orthostatic symptoms.  She is unaware of palpitations.  She presents for 2-year follow-up evaluation.  Past Medical History:  Diagnosis Date  . CAD (coronary  artery disease) 7/12-8/12   staged LAD/RCA DES  . Diabetes mellitus ([place])   . Diastolic dysfunction 4/48   grade 1   . Dyslipidemia   . HTN (hypertension)     Past Surgical History:  Procedure Laterality Date  . PERCUTANEOUS CORONARY STENT INTERVENTION (PCI-S)  05/14/10   LAD  . PERCUTANEOUS CORONARY STENT INTERVENTION (PCI-S)  05/17/10   RCA    Allergies  Allergen Reactions  . Lisinopril     Cough  . Rosuvastatin   . Sulfa Antibiotics Hives    Current Outpatient Medications  Medication Sig Dispense Refill  .  aspirin 81 MG tablet Take 81 mg by mouth 3 (three) times daily.    . B Complex-C (SUPER B COMPLEX) TABS Take 1 tablet by mouth daily. Reported on 03/24/2016    . BD INSULIN SYRINGE U/F 1/2UNIT 31G X 5/16" 0.3 ML MISC     . benzonatate (TESSALON PERLES) 100 MG capsule Take 1 capsule (100 mg total) by mouth 3 (three) times daily as needed for cough. 20 capsule 0  . Biotin 5000 MCG CAPS Take 1 capsule by mouth daily.    . Calcium-Magnesium-Vitamin D (CALCIUM 500 PO) Take 1 tablet by mouth daily.    . carvedilol (COREG) 12.5 MG tablet TAKE 1 TABLET BY MOUTH TWICE A DAY 180 tablet 2  . Cholecalciferol (VITAMIN D-3) 1000 UNITS CAPS Take 1 capsule by mouth daily.    . clopidogrel (PLAVIX) 75 MG tablet TAKE 1 TABLET BY MOUTH EVERY DAY 90 tablet 3  . ezetimibe (ZETIA) 10 MG tablet TAKE 1 TABLET BY MOUTH EVERY DAY 90 tablet 1  . fish oil-omega-3 fatty acids 1000 MG capsule Take 1 g by mouth daily.    Marland Kitchen glimepiride (AMARYL) 1 MG tablet Take 1 tablet by mouth 2 (two) times daily.    . hydrochlorothiazide (HYDRODIURIL) 25 MG tablet     . insulin NPH-regular Human (NOVOLIN 70/30) (70-30) 100 UNIT/ML injection INJECT 15 UNITS SUBCUTANEOUSLY TWICE DAILY. MAX 50 UNITS DAILY DOSE    . losartan-hydrochlorothiazide (HYZAAR) 50-12.5 MG tablet Take 1 tablet by mouth daily. NEED OV FOR FUTURE REFILL. 30 tablet 0  . meloxicam (MOBIC) 15 MG tablet Take 1 tablet by mouth as directed.    . metFORMIN (GLUCOPHAGE) 1000 MG tablet Take 500 mg by mouth 2 (two) times daily with a meal.     . NOVOLIN 70/30 RELION (70-30) 100 UNIT/ML injection     . ondansetron (ZOFRAN ODT) 4 MG disintegrating tablet Take 1 tablet (4 mg total) by mouth every 8 (eight) hours as needed for nausea or vomiting. 10 tablet 0  . OZEMPIC, 1 MG/DOSE, 2 MG/1.5ML SOPN     . pantoprazole (PROTONIX) 40 MG tablet Take 1 tablet by mouth daily. Reported on 03/24/2016    . rosuvastatin (CRESTOR) 10 MG tablet TAKE 1 TABLET BY MOUTH ONE TIME PER WEEK 12 tablet 3  .  acarbose (PRECOSE) 50 MG tablet Take 1 tablet by mouth 2 (two) times daily.     Marland Kitchen gabapentin (NEURONTIN) 300 MG capsule Take 300 mg by mouth 3 (three) times daily as needed. Reported on 03/24/2016    . naproxen (NAPROSYN) 500 MG tablet Take 1 tablet (500 mg total) by mouth 2 (two) times daily with a meal. (Patient not taking: Reported on 11/19/2019) 14 tablet 0  . nitroGLYCERIN (NITROSTAT) 0.4 MG SL tablet Place 1 tablet (0.4 mg total) under the tongue every 5 (five) minutes as needed for chest pain. 25 tablet 3  .  pioglitazone (ACTOS) 15 MG tablet Take 1 tablet by mouth daily.     No current facility-administered medications for this visit.    Social History   Socioeconomic History  . Marital status: Married    Spouse name: Not on file  . Number of children: Not on file  . Years of education: Not on file  . Highest education level: Not on file  Occupational History  . Not on file  Tobacco Use  . Smoking status: Never Smoker  . Smokeless tobacco: Never Used  Substance and Sexual Activity  . Alcohol use: Yes    Comment: Occasionally-very rare  . Drug use: Not on file  . Sexual activity: Not on file  Other Topics Concern  . Not on file  Social History Narrative  . Not on file   Social Determinants of Health   Financial Resource Strain:   . Difficulty of Paying Living Expenses: Not on file  Food Insecurity:   . Worried About Running Out of Food in the Last Year: Not on file  . Ran Out of Food in the Last Year: Not on file  Transportation Needs:   . Lack of Transportation (Medical): Not on file  . Lack of Transportation (Non-Medical): Not on file  Physical Activity:   . Days of Exercise per Week: Not on file  . Minutes of Exercise per Session: Not on file  Stress:   . Feeling of Stress : Not on file  Social Connections:   . Frequency of Communication with Friends and Family: Not on file  . Frequency of Social Gatherings with Friends and Family: Not on file  . Attends  Religious Services: Not on file  . Active Member of Clubs or Organizations: Not on file  . Attends Club or Organization Meetings: Not on file  . Marital Status: Not on file  Intimate Partner Violence:   . Fear of Current or Ex-Partner: Not on file  . Emotionally Abused: Not on file  . Physically Abused: Not on file  . Sexually Abused: Not on file    ROS General: Negative; No fevers, chills, or night sweats;  HEENT: Negative; No changes in vision or hearing, sinus congestion, difficulty swallowing Pulmonary: Negative; No cough, wheezing, shortness of breath, hemoptysis Cardiovascular: See history of present illness GI: Negative; No nausea, vomiting, diarrhea, or abdominal pain GU: Negative; No dysuria, hematuria, or difficulty voiding Musculoskeletal: Negative; no myalgias, joint pain, or weakness Hematologic/Oncology: Negative; no easy bruising, bleeding Endocrine: Positive for diabetes; no heat/cold intolerance; Neuro: Negative; no changes in balance, headaches Skin: Negative; No rashes or skin lesions Psychiatric: Negative; No behavioral problems, depression Sleep: Negative; No snoring, daytime sleepiness, hypersomnolence, bruxism, restless legs, hypnogognic hallucinations, no cataplexy Other comprehensive 14 point system review is negative.   PE BP 129/69   Pulse 82   Ht 5' 4" (1.626 m)   Wt 200 lb 6.4 oz (90.9 kg)   SpO2 97%   BMI 34.40 kg/m    Repeat blood pressure by me was 114/70 sitting and 112/72 standing  Wt Readings from Last 3 Encounters:  11/19/19 200 lb 6.4 oz (90.9 kg)  11/24/17 205 lb 6.4 oz (93.2 kg)  12/21/16 200 lb (90.7 kg)   General: Alert, oriented, no distress.  Skin: normal turgor, no rashes, warm and dry HEENT: Normocephalic, atraumatic. Pupils equal round and reactive to light; sclera anicteric; extraocular muscles intact;  Nose without nasal septal hypertrophy Mouth/Parynx benign; Mallinpatti scale 3 Neck: No JVD, no carotid bruits; normal    carotid upstroke Lungs: clear to ausculatation and percussion; no wheezing or rales Chest wall: without tenderness to palpitation Heart: PMI not displaced, RRR, s1 s2 normal, 1/6 systolic murmur, no diastolic murmur, no rubs, gallops, thrills, or heaves Abdomen: soft, nontender; no hepatosplenomehaly, BS+; abdominal aorta nontender and not dilated by palpation. Back: no CVA tenderness Pulses 2+ Musculoskeletal: full range of motion, normal strength, no joint deformities Extremities: no clubbing cyanosis or edema, Homan's sign negative  Neurologic: grossly nonfocal; Cranial nerves grossly wnl Psychologic: Normal mood and affect   ECG (independently read by me): Normal sinus rhythm at 82; no ectopy  February 2019 ECG (independently read by me): Normal sinus rhythm at 72 bpm.  Borderline left atrial enlargement.  No ST segment changes.  Normal intervals.QTc 464 msec  March 2018 ECG (independently read by me): Normal sinus rhythm at 72 bpm.  No ectopy.  Normal intervals.  November 2016 ECG (independently read by me): Normal sinus rhythm at 85 bpm.  Normal intervals.  No significant ST segment changes.  May 2015 ECG (independently read by me): Normal sinus rhythm at 74 beats per minute.  Mild T wave changes V1, V2, and V3.  LABS:  BMP Latest Ref Rng & Units 10/29/2019 11/17/2017 12/21/2016  Glucose 65 - 99 mg/dL 163(H) 218(H) 216(H)  BUN 8 - 27 mg/dL 17 22 18  Creatinine 0.57 - 1.00 mg/dL 0.82 0.77 0.76  BUN/Creat Ratio 12 - 28 21 NOT APPLICABLE -  Sodium 134 - 144 mmol/L 137 136 137  Potassium 3.5 - 5.2 mmol/L 4.5 4.4 4.3  Chloride 96 - 106 mmol/L 96 100 99  CO2 20 - 29 mmol/L 24 29 29  Calcium 8.7 - 10.3 mg/dL 9.6 9.2 9.2   Hepatic Function Latest Ref Rng & Units 10/29/2019 11/17/2017 12/21/2016  Total Protein 6.0 - 8.5 g/dL 7.3 6.9 7.1  Albumin 3.7 - 4.7 g/dL 4.4 - 4.1  AST 0 - 40 IU/L 16 11 14  ALT 0 - 32 IU/L 26 25 22  Alk Phosphatase 39 - 117 IU/L 52 - 43  Total Bilirubin 0.0 - 1.2  mg/dL 0.2 0.4 0.4   CBC Latest Ref Rng & Units 12/21/2016 11/13/2016 08/24/2015  WBC 3.8 - 10.8 K/uL 4.9 6.1 6.3  Hemoglobin 11.7 - 15.5 g/dL 12.9 12.9 13.7  Hematocrit 35.0 - 45.0 % 38.6 39.1 41.2  Platelets 140 - 400 K/uL 236 197 250   Lab Results  Component Value Date   MCV 87.5 12/21/2016   MCV 87.1 11/13/2016   MCV 87.5 08/24/2015   Lab Results  Component Value Date   TSH 1.45 12/21/2016    Lipid Panel     Component Value Date/Time   CHOL 119 10/29/2019 0844   CHOL 122 02/07/2014 0902   TRIG 190 (H) 10/29/2019 0844   TRIG 160 (H) 02/07/2014 0902   HDL 43 10/29/2019 0844   HDL 46 02/07/2014 0902   CHOLHDL 2.8 10/29/2019 0844   CHOLHDL 3.6 11/17/2017 0833   VLDL 26 12/21/2016 0912   LDLCALC 45 10/29/2019 0844   LDLCALC 94 11/17/2017 0833   LDLCALC 44 02/07/2014 0902    RADIOLOGY: No results found.   IMPRESSION:  1. CAD in native artery: s/p PCI to LAD and RCA in 2011   2. Essential hypertension   3. Diastolic dysfunction-garde 1   4. Type 2 diabetes mellitus with complication, with long-term current use of insulin (HCC)   5. Moderate obesity   6. Hyperlipidemia with target LDL less than 70       ASSESSMENT AND PLAN: Diana [name] is a 74 year old female who underwent two-vessel intervention including a 95% ostial LAD stenosis with PTCA stenting and tandem stenting to her RCA in 2011. Her last nuclear perfusion study in 2014 remained low risk.   She has a history of moderate obesity, diabetes mellitus, and peripheral neuropathy.  She is not having any recurrent anginal symptomatology.  Her blood pressure today is stable on losartan HCT 50/12.5 mg in addition to carvedilol 12.5 mg twice a day.  She continues to be on Zetia in addition to rosuvastatin 10 mg once a week for hyperlipidemia.  Target LDL is less than 70.  Diana [name] on October 29, 2019 showed total cholesterol 119 LDL cholesterol 45.  Triglycerides were mildly increased at 190.  She is on over-the-counter omega-3  fatty acids.  I discussed additional reduction in carbohydrates and sweets.  She is now followed by Dr. Iran Planas for endocrinology and continues to be on glimepiride, Metformin, 2 types of insulin in addition to she was just started on Ozempic weekly.  Remotely she had developed some edema on Actos.  BMI is 34.4 consistent with obesity.  I discussed the importance of weight loss and exercise.  At times she does note trivial ankle edema.  She will continue with her current cardiac medication.  I will see her in 1 year for reevaluation or sooner as needed.  Time spent: 25 minutes  Troy Sine, MD, [facility]  11/20/2019 1:12 PM

## 2019-11-20 ENCOUNTER — Encounter: Payer: Self-pay | Admitting: Cardiovascular Disease

## 2019-11-26 ENCOUNTER — Other Ambulatory Visit: Payer: Self-pay | Admitting: Cardiovascular Disease

## 2019-11-26 ENCOUNTER — Ambulatory Visit: Payer: Medicare HMO | Attending: Internal Medicine

## 2019-11-26 DIAGNOSIS — Z23 Encounter for immunization: Secondary | ICD-10-CM | POA: Insufficient documentation

## 2019-11-26 NOTE — Progress Notes (Signed)
   Covid-19 Vaccination Clinic  Name:  Diana [name]    MRN: [id number] DOB: [birth year]  11/26/2019  [name] was observed post Covid-19 immunization for 15 minutes without incidence. She was provided with Vaccine Information Sheet and instruction to access the V-Safe system.   [name] was instructed to call 911 with any severe reactions post vaccine: Marland Kitchen Difficulty breathing  . Swelling of your face and throat  . A fast heartbeat  . A bad rash all over your body  . Dizziness and weakness    Immunizations Administered    Name Date Dose VIS Date Route   Pfizer COVID-19 Vaccine 11/26/2019  8:40 AM 0.3 mL 09/27/2019 Intramuscular   Manufacturer: [employer], [employer]   Lot: [id number]   NDC: [address]-5

## 2020-01-15 ENCOUNTER — Other Ambulatory Visit: Payer: Self-pay | Admitting: Cardiovascular Disease

## 2020-02-15 ENCOUNTER — Other Ambulatory Visit: Payer: Self-pay | Admitting: Cardiovascular Disease

## 2020-07-22 ENCOUNTER — Other Ambulatory Visit: Payer: Self-pay | Admitting: Cardiovascular Disease

## 2020-07-28 ENCOUNTER — Other Ambulatory Visit: Payer: Self-pay

## 2020-07-28 ENCOUNTER — Encounter (HOSPITAL_COMMUNITY): Payer: Self-pay | Admitting: Emergency Medicine

## 2020-07-28 ENCOUNTER — Ambulatory Visit (HOSPITAL_COMMUNITY)
Admission: EM | Admit: 2020-07-28 | Discharge: 2020-07-28 | Disposition: A | Discharge status: Home / Self Care | Payer: Medicare HMO

## 2020-07-28 ENCOUNTER — Emergency Department (HOSPITAL_COMMUNITY): Payer: Medicare HMO

## 2020-07-28 ENCOUNTER — Emergency Department (HOSPITAL_COMMUNITY)
Admission: EM | Admit: 2020-07-28 | Discharge: 2020-07-28 | Disposition: A | Discharge status: Home / Self Care | Payer: Medicare HMO | Attending: Emergency Medicine | Admitting: Emergency Medicine

## 2020-07-28 DIAGNOSIS — Z7984 Long term (current) use of oral hypoglycemic drugs: Secondary | ICD-10-CM | POA: Diagnosis not present

## 2020-07-28 DIAGNOSIS — S0083XA Contusion of other part of head, initial encounter: Secondary | ICD-10-CM | POA: Diagnosis not present

## 2020-07-28 DIAGNOSIS — Z794 Long term (current) use of insulin: Secondary | ICD-10-CM | POA: Diagnosis not present

## 2020-07-28 DIAGNOSIS — S0990XA Unspecified injury of head, initial encounter: Secondary | ICD-10-CM | POA: Diagnosis present

## 2020-07-28 DIAGNOSIS — E785 Hyperlipidemia, unspecified: Secondary | ICD-10-CM | POA: Insufficient documentation

## 2020-07-28 DIAGNOSIS — E1169 Type 2 diabetes mellitus with other specified complication: Secondary | ICD-10-CM | POA: Insufficient documentation

## 2020-07-28 DIAGNOSIS — W06XXXA Fall from bed, initial encounter: Secondary | ICD-10-CM | POA: Insufficient documentation

## 2020-07-28 DIAGNOSIS — J441 Chronic obstructive pulmonary disease with (acute) exacerbation: Secondary | ICD-10-CM | POA: Diagnosis not present

## 2020-07-28 DIAGNOSIS — I10 Essential (primary) hypertension: Secondary | ICD-10-CM | POA: Diagnosis not present

## 2020-07-28 DIAGNOSIS — I2510 Atherosclerotic heart disease of native coronary artery without angina pectoris: Secondary | ICD-10-CM | POA: Insufficient documentation

## 2020-07-28 DIAGNOSIS — Z7982 Long term (current) use of aspirin: Secondary | ICD-10-CM | POA: Insufficient documentation

## 2020-07-28 DIAGNOSIS — Z9861 Coronary angioplasty status: Secondary | ICD-10-CM | POA: Insufficient documentation

## 2020-07-28 DIAGNOSIS — E1140 Type 2 diabetes mellitus with diabetic neuropathy, unspecified: Secondary | ICD-10-CM | POA: Diagnosis not present

## 2020-07-28 DIAGNOSIS — W19XXXA Unspecified fall, initial encounter: Secondary | ICD-10-CM

## 2020-07-28 NOTE — ED Provider Notes (Signed)
[facility]-EMERGENCY DEPT Provider Note   CSN: [id number] Arrival date & time: 07/28/20  1515     History Chief Complaint  Patient presents with  . facial swelling/bruising    Diana [name] is a 74 y.o. female.  HPI Patient presents after fall.  While he fell out of bed around 3 in the morning striking her forehead.  No loss of consciousness.  Went back to sleep after little while but then got up after not sleeping.  Is been up since then.  She is on Plavix.  Went to urgent care and was told to come in here for further treatment.  Had also seen PCP who sent her to get CT scan.  No neck pain.  No vision changes.  States that her glasses were feeling tighter.    Past Medical History:  Diagnosis Date  . CAD (coronary artery disease) 7/12-8/12   staged LAD/RCA DES  . Diabetes mellitus (HCC)   . Diastolic dysfunction 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   . Dyslipidemia   . HTN (hypertension)     Patient Active Problem List   Diagnosis Date Noted  . COPD exacerbation (HCC) 11/13/2016  . Diabetes mellitus with diabetic neuropathy (HCC) 08/24/2015  . Dyslipidemia   . Diastolic dysfunction-garde 1   . CAD-staged LAD DES (July 2011), RCA DES (Aug 2011) 05/24/2013  . DM2 (diabetes mellitus, type 2) (HCC) 05/24/2013  . HTN (hypertension) 05/24/2013  . Obesity (BMI 30-39.9) 05/24/2013    Past Surgical History:  Procedure Laterality Date  . PERCUTANEOUS CORONARY STENT INTERVENTION (PCI-S)  05/14/10   LAD  . PERCUTANEOUS CORONARY STENT INTERVENTION (PCI-S)  05/17/10   RCA     OB History   No obstetric history on file.     Family History  Problem Relation Age of Onset  . CAD Neg Hx     Social History   Tobacco Use  . Smoking status: Never Smoker  . Smokeless tobacco: Never Used  Substance Use Topics  . Alcohol use: Yes    Comment: Occasionally-very rare  . Drug use: Not on file    Home Medications Prior to Admission medications   Medication Sig Start Date End  Date Taking? Authorizing Provider  acarbose (PRECOSE) 50 MG tablet Take 1 tablet by mouth 2 (two) times daily.  05/21/13   [provider]  aspirin 81 MG tablet Take 81 mg by mouth 3 (three) times daily.    [provider]  B Complex-C (SUPER B COMPLEX) TABS Take 1 tablet by mouth daily. Reported on 03/24/2016    [provider]  BD INSULIN SYRINGE U/F 1/2UNIT 31G X 5/16" 0.3 ML MISC  11/05/19   [provider]  benzonatate (TESSALON PERLES) 100 MG capsule Take 1 capsule (100 mg total) by mouth 3 (three) times daily as needed for cough. 11/13/16   Angelina Ok, MD  Biotin 5000 MCG CAPS Take 1 capsule by mouth daily.    [provider]  Calcium-Magnesium-Vitamin D (CALCIUM 500 PO) Take 1 tablet by mouth daily.    [provider]  carvedilol (COREG) 12.5 MG tablet TAKE 1 TABLET BY MOUTH TWICE A DAY 10/25/19   Lennette Bihari, MD  Cholecalciferol (VITAMIN D-3) 1000 UNITS CAPS Take 1 capsule by mouth daily.    [provider]  clopidogrel (PLAVIX) 75 MG tablet TAKE 1 TABLET BY MOUTH EVERY DAY 02/18/20   Lennette Bihari, MD  ezetimibe (ZETIA) 10 MG tablet TAKE 1 TABLET BY MOUTH  EVERY DAY 07/22/20   Lennette Bihari, MD  fish oil-omega-3 fatty acids 1000 MG capsule Take 1 g by mouth daily.    [provider]  gabapentin (NEURONTIN) 300 MG capsule Take 300 mg by mouth 3 (three) times daily as needed. Reported on 03/24/2016    [provider]  glimepiride (AMARYL) 1 MG tablet Take 1 tablet by mouth 2 (two) times daily. 12/12/16   [provider]  hydrochlorothiazide (HYDRODIURIL) 25 MG tablet  09/18/19   [provider]  insulin NPH-regular Human (NOVOLIN 70/30) (70-30) 100 UNIT/ML injection INJECT 15 UNITS SUBCUTANEOUSLY TWICE DAILY. MAX 50 UNITS DAILY DOSE 10/09/19   [provider]  losartan-hydrochlorothiazide (HYZAAR) 50-12.5 MG tablet Take 1 tablet by mouth daily. 11/26/19   Lennette Bihari, MD  meloxicam  (MOBIC) 15 MG tablet Take 1 tablet by mouth as directed. 11/21/17   [provider]  metFORMIN (GLUCOPHAGE) 1000 MG tablet Take 500 mg by mouth 2 (two) times daily with a meal.  04/29/13   [provider]  naproxen (NAPROSYN) 500 MG tablet Take 1 tablet (500 mg total) by mouth 2 (two) times daily with a meal. Patient not taking: Reported on 11/19/2019 11/13/16   Angelina Ok, MD  nitroGLYCERIN (NITROSTAT) 0.4 MG SL tablet Place 1 tablet (0.4 mg total) under the tongue every 5 (five) minutes as needed for chest pain. 11/24/17 02/22/18  Lennette Bihari, MD  NOVOLIN 70/30 RELION (70-30) 100 UNIT/ML injection  10/14/19   [provider]  ondansetron (ZOFRAN ODT) 4 MG disintegrating tablet Take 1 tablet (4 mg total) by mouth every 8 (eight) hours as needed for nausea or vomiting. 11/13/16   Angelina Ok, MD  [facility], 1 MG/DOSE, 2 MG/1.5ML [facility]  10/29/19   [provider]  pantoprazole (PROTONIX) 40 MG tablet Take 1 tablet by mouth daily. Reported on 03/24/2016 05/06/13   [provider]  pioglitazone (ACTOS) 15 MG tablet Take 1 tablet by mouth daily. 12/16/16   [provider]  rosuvastatin (CRESTOR) 10 MG tablet TAKE 1 TABLET BY MOUTH ONE TIME PER WEEK 11/06/19   Lennette Bihari, MD    Allergies    Lisinopril, Rosuvastatin, and Sulfa antibiotics  Review of Systems   Review of Systems  Constitutional: Negative for appetite change.  HENT: Negative for congestion.   Eyes: Negative for visual disturbance.  Respiratory: Negative for shortness of breath.   Cardiovascular: Negative for chest pain.  Musculoskeletal: Negative for neck pain.  Skin: Positive for wound.  Neurological: Negative for headaches.  Psychiatric/Behavioral: Negative for confusion.    Physical Exam Updated Vital Signs BP (!) 177/76 (BP Location: Right Arm)   Pulse 92   Temp 98.2 F (36.8 C) (Oral)   Resp 16   SpO2 98%   Physical Exam Vitals and nursing note reviewed.  HENT:      Head:     Comments: Periorbital ecchymosis bilaterally.  Some swelling of bridge of nose with small abrasion.  Face stable.  Eye movements intact without diplopia.    Right Ear: Tympanic membrane normal.     Left Ear: Tympanic membrane normal.  Eyes:     Extraocular Movements: Extraocular movements intact.     Pupils: Pupils are equal, round, and reactive to light.  Neck:     Comments: No midline tenderness. Cardiovascular:     Rate and Rhythm: Regular rhythm.  Pulmonary:     Breath sounds: No rhonchi.  Musculoskeletal:     Cervical back: Neck  supple.  Skin:    General: Skin is warm.     Capillary Refill: Capillary refill takes less than 2 seconds.  Neurological:     Mental Status: She is alert and oriented to person, place, and time.     ED Results / Procedures / Treatments   Labs (all labs ordered are listed, but only abnormal results are displayed) Labs Reviewed - No data to display  EKG None  Radiology CT Head Wo Contrast  Result Date: 07/28/2020 CLINICAL DATA:  Fall out of bed several hours ago with persistent pain and swelling, initial encounter EXAM: CT HEAD WITHOUT CONTRAST CT MAXILLOFACIAL WITHOUT CONTRAST TECHNIQUE: Multidetector CT imaging of the head and maxillofacial structures were performed using the standard protocol without intravenous contrast. Multiplanar CT image reconstructions of the maxillofacial structures were also generated. COMPARISON:  None. FINDINGS: CT HEAD FINDINGS Brain: No evidence of acute infarction, hemorrhage, hydrocephalus, extra-axial collection or mass lesion/mass effect. Mild chronic white matter ischemic change and atrophic change is seen. Vascular: No hyperdense vessel or unexpected calcification. Skull: Normal. Negative for fracture or focal lesion. Other: None. CT MAXILLOFACIAL FINDINGS Osseous: No acute bony abnormality is noted. Orbits: Orbits and their contents are within normal limits. Sinuses: Paranasal sinuses show no air-fluid  levels. Minimal mucosal thickening is noted within the left maxillary antrum. Near complete opacification of the left sphenoid sinus is seen. Soft tissues: Surrounding soft tissue structures show no inflammatory change. No focal hematoma is seen. IMPRESSION: CT of the head: Chronic atrophic and ischemic changes without acute abnormality. CT of the maxillofacial bones: No acute bony abnormality is noted. Mild sinus disease is noted as described. Electronically Signed   By: Alcide Clever M.D.   On: 07/28/2020 17:41   CT Maxillofacial Wo Contrast  Result Date: 07/28/2020 CLINICAL DATA:  Fall out of bed several hours ago with persistent pain and swelling, initial encounter EXAM: CT HEAD WITHOUT CONTRAST CT MAXILLOFACIAL WITHOUT CONTRAST TECHNIQUE: Multidetector CT imaging of the head and maxillofacial structures were performed using the standard protocol without intravenous contrast. Multiplanar CT image reconstructions of the maxillofacial structures were also generated. COMPARISON:  None. FINDINGS: CT HEAD FINDINGS Brain: No evidence of acute infarction, hemorrhage, hydrocephalus, extra-axial collection or mass lesion/mass effect. Mild chronic white matter ischemic change and atrophic change is seen. Vascular: No hyperdense vessel or unexpected calcification. Skull: Normal. Negative for fracture or focal lesion. Other: None. CT MAXILLOFACIAL FINDINGS Osseous: No acute bony abnormality is noted. Orbits: Orbits and their contents are within normal limits. Sinuses: Paranasal sinuses show no air-fluid levels. Minimal mucosal thickening is noted within the left maxillary antrum. Near complete opacification of the left sphenoid sinus is seen. Soft tissues: Surrounding soft tissue structures show no inflammatory change. No focal hematoma is seen. IMPRESSION: CT of the head: Chronic atrophic and ischemic changes without acute abnormality. CT of the maxillofacial bones: No acute bony abnormality is noted. Mild sinus  disease is noted as described. Electronically Signed   By: Alcide Clever M.D.   On: 07/28/2020 17:41    Procedures Procedures (including critical care time)  Medications Ordered in ED Medications - No data to display  ED Course  I have reviewed the triage vital signs and the nursing notes.  Pertinent labs & imaging results that were available during my care of the patient were reviewed by me and considered in my medical decision making (see chart for details).    MDM Rules/Calculators/A&P  Patient with fall at around 3 in the morning.  Hit face.  Does have periorbital ecchymosis.  Small nonsuturable laceration on nose.  Is on Plavix head CT and maxillofacial CT reassuring.  Discharge home.  Patient informed of risk of delayed bleeding. Final Clinical Impression(s) / ED Diagnoses Final diagnoses:  Fall, initial encounter  Contusion of face, initial encounter    Rx / DC Orders ED Discharge Orders    None       Benjiman CorePickering, Meena Barrantes, MD 07/28/20 1800

## 2020-07-28 NOTE — ED Triage Notes (Signed)
Per patient, states she roll over in bed hitting night stand between her eyes-states she went to work with now issues-states started having some bruising around eyes and nose and swelling-went to [facility] and they sent her here for CT due to being on blood thinners-no LOC

## 2020-07-28 NOTE — ED Notes (Signed)
An After Visit Summary was printed and given to the patient. Discharge instructions given and no further questions at this time.  

## 2020-07-28 NOTE — ED Triage Notes (Signed)
Patient in with c/o head injury after rolling out of bed and hitting head on night stand this morning. Patient c/o tenderness underneath eyebrows and feeling as if her glasses are tightening   Patient took plavix and aspirin this morning  Noticeable ecchymosis above and below eyes  Denies loc,n/v, vision changes, no increase in lethargy  Traci Bast notified of patient status. Patient advised to go to ED. Daughter will transport to [facility].

## 2020-07-28 NOTE — ED Notes (Signed)
Patient is being discharged from the Urgent Care and sent to the Emergency Department via pov. Per Dahlia Byes, NP. patient is in need of higher level of care due to head injury, anticoagulant therapy, facial ecchymosis and swelling. Patient is aware and verbalizes understanding of plan of care.  Vitals:   07/28/20 1447  BP: (!) 146/75  Pulse: 88  SpO2: 98%

## 2020-09-01 ENCOUNTER — Other Ambulatory Visit: Payer: Self-pay | Admitting: Cardiovascular Disease

## 2020-10-16 ENCOUNTER — Other Ambulatory Visit: Payer: Self-pay | Admitting: Cardiovascular Disease

## 2020-11-26 ENCOUNTER — Other Ambulatory Visit: Payer: Self-pay | Admitting: Cardiovascular Disease

## 2020-12-15 ENCOUNTER — Other Ambulatory Visit: Payer: Self-pay | Admitting: Family Medicine

## 2020-12-15 DIAGNOSIS — Z1231 Encounter for screening mammogram for malignant neoplasm of breast: Secondary | ICD-10-CM

## 2020-12-21 ENCOUNTER — Ambulatory Visit
Admission: RE | Admit: 2020-12-21 | Discharge: 2020-12-21 | Disposition: A | Discharge status: Home / Self Care | Payer: Medicare HMO | Source: Ambulatory Visit | Attending: Family Medicine | Admitting: Family Medicine

## 2020-12-21 ENCOUNTER — Other Ambulatory Visit: Payer: Self-pay

## 2020-12-21 DIAGNOSIS — Z1231 Encounter for screening mammogram for malignant neoplasm of breast: Secondary | ICD-10-CM

## 2021-01-13 ENCOUNTER — Other Ambulatory Visit: Payer: Self-pay | Admitting: Cardiovascular Disease

## 2021-01-13 ENCOUNTER — Telehealth: Payer: Self-pay | Admitting: Cardiovascular Disease

## 2021-01-13 NOTE — Telephone Encounter (Signed)
Called patient to follow up and she stated she was not sure if she needed any refills and that she will check when she gets home. Patient stated that she received a letter stating to go to a certain web site to order her medication but her phone don't do that. I reassured pt if she needs any refills she can give her pharmacy a call.

## 2021-01-13 NOTE — Telephone Encounter (Signed)
Follow Up:    Pt said somebody called her about her refill, but she did not who it was that called.

## 2021-01-29 ENCOUNTER — Other Ambulatory Visit: Payer: Self-pay | Admitting: Cardiovascular Disease

## 2021-02-17 ENCOUNTER — Other Ambulatory Visit: Payer: Self-pay | Admitting: Cardiovascular Disease

## 2021-02-27 ENCOUNTER — Other Ambulatory Visit: Payer: Self-pay | Admitting: Cardiovascular Disease

## 2021-04-24 ENCOUNTER — Other Ambulatory Visit: Payer: Self-pay | Admitting: Cardiovascular Disease

## 2021-05-11 ENCOUNTER — Ambulatory Visit: Payer: Medicare HMO | Admitting: Cardiovascular Disease

## 2021-05-11 ENCOUNTER — Encounter: Payer: Self-pay | Admitting: Cardiovascular Disease

## 2021-05-11 ENCOUNTER — Other Ambulatory Visit: Payer: Self-pay

## 2021-05-11 DIAGNOSIS — E668 Other obesity: Secondary | ICD-10-CM

## 2021-05-11 DIAGNOSIS — I2510 Atherosclerotic heart disease of native coronary artery without angina pectoris: Secondary | ICD-10-CM

## 2021-05-11 DIAGNOSIS — Z794 Long term (current) use of insulin: Secondary | ICD-10-CM

## 2021-05-11 DIAGNOSIS — E118 Type 2 diabetes mellitus with unspecified complications: Secondary | ICD-10-CM

## 2021-05-11 DIAGNOSIS — E785 Hyperlipidemia, unspecified: Secondary | ICD-10-CM | POA: Diagnosis not present

## 2021-05-11 DIAGNOSIS — I10 Essential (primary) hypertension: Secondary | ICD-10-CM

## 2021-05-11 DIAGNOSIS — I5189 Other ill-defined heart diseases: Secondary | ICD-10-CM

## 2021-05-11 MED ORDER — NITROGLYCERIN 0.4 MG SL SUBL: 0.4000 mg | SUBLINGUAL_TABLET | SUBLINGUAL | 3 refills | Status: DC | PRN

## 2021-05-11 NOTE — Patient Instructions (Addendum)
Medication Instructions:  Your physician recommends that you continue on your current medications as directed. Please refer to the Current Medication list given to you today.  Refilled nitroglycerin  *If you need a refill on your cardiac medications before your next appointment, please call your pharmacy*   Lab Work: FASTING - CBC, CMET, TSH, Lipid  If you have labs (blood work) drawn today and your tests are completely normal, you will receive your results only by: MyChart Message (if you have MyChart) OR A paper copy in the mail If you have any lab test that is abnormal or we need to change your treatment, we will call you to review the results.   Testing/Procedures: None ordered.    Follow-Up: At [facility], you and your health needs are our priority.  As part of our continuing mission to provide you with exceptional heart care, we have created designated Provider Care Teams.  These Care Teams include your primary Cardiologist (physician) and Advanced Practice Providers (APPs -  Physician Assistants and Nurse Practitioners) who all work together to provide you with the care you need, when you need it.  We recommend signing up for the patient portal called "MyChart".  Sign up information is provided on this After Visit Summary.  MyChart is used to connect with patients for Virtual Visits (Telemedicine).  Patients are able to view lab/test results, encounter notes, upcoming appointments, etc.  Non-urgent messages can be sent to your provider as well.   To learn more about what you can do with MyChart, go to [email or web address].    Your next appointment:   12 month(s)  The format for your next appointment:   In Person  Provider:   Nicki Guadalajara, MD

## 2021-05-11 NOTE — Progress Notes (Signed)
Positive blood pressure pressures okay monitor blood sugar blood pressure sure no problem patient ID: Diana [name], female   DOB: 1946-01-04, 75 y.o.   MRN: [id number]      HPI: Diana [name] is a 75 y.o. female presents to the office today for a 43 month cardiology follow up evaluation.  Diana [name] has CAD and in July/August 2011 she underwent staged intervention with PTCA and stenting of a 95% ostial LAD stenosis (3.5x12 mm promiscuity a stent) postdilated 3.75 mm, end-stage intervention to) artery with tandem 3.0x28 and 2.5x23 mm promiscuity. She has a history of hyperlipidemia. In January 2012 while on Crestor she developed mild transaminase elevation leading to its discontinuance.  She has a history of diabetes mellitus which has been poorly controlled, GERD,  remote history of pneumonia. An echo Doppler study in March 2014 showed normal systolic function with grade 1 diastolic dysfunction. She did have mild aortic sclerosis with mild MR and mild LA dilation. I scheduled her for a two-year followup nuclear perfusion study but insurance I denied this.  Laboratory in April 2014 showed a glucose of 169. At that time, her cholesterol was 166 LDL 81 triglycerides were 192 and hemoglobin A1c was 9.0. Her primary physician is increased her insulin. An NMR evaluation showed an LDL cholesterol of 70 but due to her increased small LDL particles and 1086, LDL particle number was still significantly increased at 1383. HDL was 44 triglycerides are still elevated at 180. Recent hemoglobin A1c was 87. ,  She was started on Zetia, which she had tolerated.  Diana [name] underwent a nuclear perfusion study on 06/04/2013 which continued to show normal perfusion and function. Ejection fraction was 70%. There was no evidence for scar or ischemia.    When I saw her in March 2018  she was without chest pain or shortness of breath.  She was no longer taking insulin for her diabetes mellitus.    I saw her in February 2019.   At that time she remained stable and was without chest pain.   She has been on Zetia 10 mg at fish oil for hyperlipidemia.  A  lipid panel 1 week prior to that evaluation had shown a total cholesterol 164, HDL 46, triglycerides 140, and LDL 94.  She has continued to take aspirin and Plavix.  She is on losartan HCT 50/12.5 and carvedilol 12.5 mm twice a day for blood pressure and her CAD.  She was on metformin 500 mg twice a day, glimepiride 1 mg twice a day, and Actos 15 mg for diabetes mellitus.  She has a peripheral neuropathy on gabapentin.  GERD is controlled with Protonix.  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  She obtains fiber, and is involved in breeding, boarding, and rescues other animals.    I saw her in February 2021 after not having seen her in 2 years.  At that time she was continuing to do well and was without chest pain or shortness of breath.  She was now seeing Dr. Iran Planas for endocrinology and continues to be on metformin, glimepiride and was recently started on Ozempic.  She was no longer on pioglitazone.  She has continued to be on Zetia and rosuvastatin 10 mg for hyperlipidemia and  DAPT with aspirin/Plavix.  She noticed rare episodes of lightheadedness but denies any orthostatic symptoms.  She was unaware of palpitations.  Since I last saw her, she has continued to see Dr.  Iran Planas at [facility] for her endocrinology care.  She states recently her hemoglobin A1c had risen to 12.  She continues to be on aspirin and Plavix for DAPT.  She is on Zetia and rosuvastatin for hyperlipidemia.  She has not had any chest pain and continues to be on carvedilol 12.5 mg, losartan HCT 50/12.5 mg for hypertension.  She is on metformin in addition to glimepiride and Ozempic now for her diabetes mellitus.  She presents for evaluation,   Past Medical History:  Diagnosis Date   CAD (coronary artery disease) 7/12-8/12   staged LAD/RCA DES    Diabetes mellitus ([place])    Diastolic dysfunction 9/52   grade 1    Dyslipidemia    HTN (hypertension)     Past Surgical History:  Procedure Laterality Date   PERCUTANEOUS CORONARY STENT INTERVENTION (PCI-S)  05/14/10   LAD   PERCUTANEOUS CORONARY STENT INTERVENTION (PCI-S)  05/17/10   RCA    Allergies  Allergen Reactions   Lisinopril     Cough   Rosuvastatin    Sulfa Antibiotics Hives    Current Outpatient Medications  Medication Sig Dispense Refill   aspirin 81 MG tablet Take 81 mg by mouth daily.     B Complex-C (SUPER B COMPLEX) TABS Take 1 tablet by mouth daily. Reported on 03/24/2016     BD INSULIN SYRINGE U/F 1/2UNIT 31G X 5/16" 0.3 ML MISC      benzonatate (TESSALON PERLES) 100 MG capsule Take 1 capsule (100 mg total) by mouth 3 (three) times daily as needed for cough. 20 capsule 0   Biotin 5000 MCG CAPS Take 1 capsule by mouth daily.     Calcium-Magnesium-Vitamin D (CALCIUM 500 PO) Take 1 tablet by mouth daily.     carvedilol (COREG) 12.5 MG tablet TAKE 1 TABLET BY MOUTH TWICE A DAY 180 tablet 2   Cholecalciferol (VITAMIN D-3) 1000 UNITS CAPS Take 1 capsule by mouth daily.     clopidogrel (PLAVIX) 75 MG tablet TAKE 1 TABLET BY MOUTH EVERY DAY 90 tablet 0   ezetimibe (ZETIA) 10 MG tablet TAKE 1 TABLET BY MOUTH EVERY DAY 90 tablet 2   fish oil-omega-3 fatty acids 1000 MG capsule Take 1 g by mouth daily.     glimepiride (AMARYL) 1 MG tablet Take 1 tablet by mouth 2 (two) times daily.     hydrochlorothiazide (HYDRODIURIL) 25 MG tablet      insulin NPH-regular Human (NOVOLIN 70/30) (70-30) 100 UNIT/ML injection INJECT 15 UNITS SUBCUTANEOUSLY TWICE DAILY. MAX 50 UNITS DAILY DOSE     metFORMIN (GLUCOPHAGE) 1000 MG tablet Take 500 mg by mouth 2 (two) times daily with a meal.      naproxen (NAPROSYN) 500 MG tablet Take 1 tablet (500 mg total) by mouth 2 (two) times daily with a meal. 14 tablet 0   NOVOLIN 70/30 RELION (70-30) 100 UNIT/ML injection      OZEMPIC, 1 MG/DOSE, 2  MG/1.5ML SOPN      pantoprazole (PROTONIX) 40 MG tablet Take 1 tablet by mouth daily. Reported on 03/24/2016     rosuvastatin (CRESTOR) 10 MG tablet TAKE 1 TABLET BY MOUTH ONE TIME PER WEEK 12 tablet 3   gabapentin (NEURONTIN) 300 MG capsule Take 300 mg by mouth 3 (three) times daily as needed. Reported on 03/24/2016 (Patient not taking: Reported on 05/11/2021)     losartan-hydrochlorothiazide (HYZAAR) 50-12.5 MG tablet TAKE 1 TABLET BY MOUTH EVERY DAY. NEED APPT FOR REFILLS 90 tablet 3  meloxicam (MOBIC) 15 MG tablet Take 1 tablet by mouth as directed. (Patient not taking: Reported on 05/11/2021)     nitroGLYCERIN (NITROSTAT) 0.4 MG SL tablet Place 1 tablet (0.4 mg total) under the tongue every 5 (five) minutes as needed for chest pain. 25 tablet 3   ondansetron (ZOFRAN ODT) 4 MG disintegrating tablet Take 1 tablet (4 mg total) by mouth every 8 (eight) hours as needed for nausea or vomiting. (Patient not taking: Reported on 05/11/2021) 10 tablet 0   pioglitazone (ACTOS) 15 MG tablet Take 1 tablet by mouth daily. (Patient not taking: Reported on 05/11/2021)     No current facility-administered medications for this visit.    Social History   Socioeconomic History   Marital status: Married    Spouse name: Not on file   Number of children: Not on file   Years of education: Not on file   Highest education level: Not on file  Occupational History   Not on file  Tobacco Use   Smoking status: Never   Smokeless tobacco: Never  Substance and Sexual Activity   Alcohol use: Yes    Comment: Occasionally-very rare   Drug use: Not on file   Sexual activity: Not on file  Other Topics Concern   Not on file  Social History Narrative   Not on file   Social Determinants of Health   Financial Resource Strain: Not on file  Food Insecurity: Not on file  Transportation Needs: Not on file  Physical Activity: Not on file  Stress: Not on file  Social Connections: Not on file  Intimate Partner Violence:  Not on file    ROS General: Negative; No fevers, chills, or night sweats;  HEENT: Negative; No changes in vision or hearing, sinus congestion, difficulty swallowing Pulmonary: Negative; No cough, wheezing, shortness of breath, hemoptysis Cardiovascular: See history of present illness GI: Negative; No nausea, vomiting, diarrhea, or abdominal pain GU: Negative; No dysuria, hematuria, or difficulty voiding Musculoskeletal: Negative; no myalgias, joint pain, or weakness Hematologic/Oncology: Negative; no easy bruising, bleeding Endocrine: Positive for diabetes; no heat/cold intolerance; Neuro: Negative; no changes in balance, headaches Skin: Negative; No rashes or skin lesions Psychiatric: Negative; No behavioral problems, depression Sleep: Negative; No snoring, daytime sleepiness, hypersomnolence, bruxism, restless legs, hypnogognic hallucinations, no cataplexy Other comprehensive 14 point system review is negative.   PE BP 119/74   Pulse 84   Ht 5' 4" (1.626 m)   Wt 205 lb 6.4 oz (93.2 kg)   SpO2 98%   BMI 35.26 kg/m    Repeat blood pressure by me was 118/75  Wt Readings from Last 3 Encounters:  05/11/21 205 lb 6.4 oz (93.2 kg)  11/19/19 200 lb 6.4 oz (90.9 kg)  11/24/17 205 lb 6.4 oz (93.2 kg)   General: Alert, oriented, no distress.  Skin: normal turgor, no rashes, warm and dry HEENT: Normocephalic, atraumatic. Pupils equal round and reactive to light; sclera anicteric; extraocular muscles intact;  Nose without nasal septal hypertrophy Mouth/Parynx benign; Mallinpatti scale Neck: No JVD, no carotid bruits; normal carotid upstroke Lungs: clear to ausculatation and percussion; no wheezing or rales Chest wall: without tenderness to palpitation Heart: PMI not displaced, RRR, s1 s2 normal, 1/6 systolic murmur, no diastolic murmur, no rubs, gallops, thrills, or heaves Abdomen: soft, nontender; no hepatosplenomehaly, BS+; abdominal aorta nontender and not dilated by  palpation. Back: no CVA tenderness Pulses 2+ Musculoskeletal: full range of motion, normal strength, no joint deformities Extremities: no clubbing cyanosis or edema, Homan's  sign negative  Neurologic: grossly nonfocal; Cranial nerves grossly wnl Psychologic: Normal mood and affect   May 11, 2021 ECG (independently read by me): Normal sinus rhythm at 87 bpm.  No ectopy.  Normal intervals.  February 2021 ECG (independently read by me): Normal sinus rhythm at 82; no ectopy  February 2019 ECG (independently read by me): Normal sinus rhythm at 72 bpm.  Borderline left atrial enlargement.  No ST segment changes.  Normal intervals.QTc 464 msec  March 2018 ECG (independently read by me): Normal sinus rhythm at 72 bpm.  No ectopy.  Normal intervals.  November 2016 ECG (independently read by me): Normal sinus rhythm at 85 bpm.  Normal intervals.  No significant ST segment changes.  May 2015 ECG (independently read by me): Normal sinus rhythm at 74 beats per minute.  Mild T wave changes V1, V2, and V3.  LABS:  BMP Latest Ref Rng & Units 10/29/2019 11/17/2017 12/21/2016  Glucose 65 - 99 mg/dL 163(H) 218(H) 216(H)  BUN 8 - 27 mg/dL _0 Creatinine 0.57 - 1.00 mg/dL 0.82 0.77 0.76  BUN/Creat Ratio 12 - 28 21 NOT APPLICABLE -  Sodium 161 - 144 mmol/L 137 136 137  Potassium 3.5 - 5.2 mmol/L 4.5 4.4 4.3  Chloride 96 - 106 mmol/L 96 100 99  CO2 20 - 29 mmol/L _1 Calcium 8.7 - 10.3 mg/dL 9.6 9.2 9.2   Hepatic Function Latest Ref Rng & Units 10/29/2019 11/17/2017 12/21/2016  Total Protein 6.0 - 8.5 g/dL 7.3 6.9 7.1  Albumin 3.7 - 4.7 g/dL 4.4 - 4.1  AST 0 - 40 IU/L _2 ALT 0 - 32 IU/L _3 Alk Phosphatase 39 - 117 IU/L 52 - 43  Total Bilirubin 0.0 - 1.2 mg/dL 0.2 0.4 0.4   CBC Latest Ref Rng & Units 12/21/2016 11/13/2016 08/24/2015  WBC 3.8 - 10.8 K/uL 4.9 6.1 6.3  Hemoglobin 11.7 - 15.5 g/dL 12.9 12.9 13.7  Hematocrit 35.0 - 45.0 % 38.6 39.1 41.2  Platelets 140 - 400 K/uL 236 197  250   Lab Results  Component Value Date   MCV 87.5 12/21/2016   MCV 87.1 11/13/2016   MCV 87.5 08/24/2015   Lab Results  Component Value Date   TSH 1.45 12/21/2016    Lipid Panel     Component Value Date/Time   CHOL 119 10/29/2019 0844   CHOL 122 02/07/2014 0902   TRIG 190 (H) 10/29/2019 0844   TRIG 160 (H) 02/07/2014 0902   HDL 43 10/29/2019 0844   HDL 46 02/07/2014 0902   CHOLHDL 2.8 10/29/2019 0844   CHOLHDL 3.6 11/17/2017 0833   VLDL 26 12/21/2016 0912   LDLCALC 45 10/29/2019 0844   LDLCALC 94 11/17/2017 0833   LDLCALC 44 02/07/2014 0902    RADIOLOGY: No results found.   IMPRESSION:  1. CAD in native artery   2. Essential hypertension   3. Hyperlipidemia with target LDL less than 70   4. Moderate obesity   5. Type 2 diabetes mellitus with complication, with long-term current use of insulin ([place])   6. Diastolic dysfunction-garde 1     ASSESSMENT AND PLAN: Diana [name] is a 75 year old female who underwent two-vessel intervention including a 95% ostial LAD stenosis with PTCA stenting and tandem stenting to her RCA in 2011. A nuclear perfusion study in 2014 remained low risk.   She has a history of moderate obesity, diabetes mellitus, and peripheral neuropathy.  She is not having any recurrent anginal symptomatology.  Blood pressure today is stable and at target on carvedilol 12.5 mg twice a day, and losartan HCT 50/12.5 mg daily.  She recently had poorly controlled diabetes and is seeing Dr. Tamala Julian at [facility].  She is now on Ozempic, in addition to metformin and glimepiride and insulin.  She continues to be on Zetia and rosuvastatin for hyperlipidemia.  She has not had recent laboratory and I am recommending a comprehensive metabolic panel, CBC, TSH and lipid studies.  He continues to be moderately obese the BMI of 35.26 and weight loss and increased exercise was recommended.  She states she walks at her job but other than that does not routinely exercise.  She sees  Dr. Tamsen Roers for primary care.  I will contact her regarding her laboratory results and adjustments will be made if necessary.  I will see her in 1 year for reevaluation   Troy Sine, MD, [facility]  05/13/2021 12:09 PM

## 2021-05-13 ENCOUNTER — Encounter: Payer: Self-pay | Admitting: Cardiovascular Disease

## 2021-05-13 ENCOUNTER — Other Ambulatory Visit: Payer: Self-pay | Admitting: Cardiovascular Disease

## 2021-05-14 ENCOUNTER — Other Ambulatory Visit: Payer: Self-pay | Admitting: Cardiovascular Disease

## 2021-05-18 LAB — COMPREHENSIVE METABOLIC PANEL
ALT: 31 IU/L (ref 0–32)
AST: 21 IU/L (ref 0–40)
Albumin/Globulin Ratio: 1.7 (ref 1.2–2.2)
Albumin: 4.3 g/dL (ref 3.7–4.7)
Alkaline Phosphatase: 48 IU/L (ref 44–121)
BUN/Creatinine Ratio: 15 (ref 12–28)
BUN: 11 mg/dL (ref 8–27)
Bilirubin Total: <0.2 mg/dL (ref 0.0–1.2)
CO2: 24 mmol/L (ref 20–29)
Calcium: 9.2 mg/dL (ref 8.7–10.3)
Chloride: 101 mmol/L (ref 96–106)
Creatinine, Ser: 0.74 mg/dL (ref 0.57–1.00)
Globulin, Total: 2.6 g/dL (ref 1.5–4.5)
Glucose: 201 mg/dL — ABNORMAL HIGH (ref 65–99)
Potassium: 4.3 mmol/L (ref 3.5–5.2)
Sodium: 141 mmol/L (ref 134–144)
Total Protein: 6.9 g/dL (ref 6.0–8.5)
eGFR: 86 mL/min/{1.73_m2} (ref >59)

## 2021-05-18 LAB — CBC
Hematocrit: 39.7 % (ref 34.0–46.6)
Hemoglobin: 13.2 g/dL (ref 11.1–15.9)
MCH: 27.9 pg (ref 26.6–33.0)
MCHC: 33.2 g/dL (ref 31.5–35.7)
MCV: 84 fL (ref 79–97)
Platelets: 322 10*3/uL (ref 150–450)
RBC: 4.73 x10E6/uL (ref 3.77–5.28)
RDW: 13.7 % (ref 11.7–15.4)
WBC: 5.6 10*3/uL (ref 3.4–10.8)

## 2021-05-18 LAB — LIPID PANEL
Chol/HDL Ratio: 3 ratio (ref 0.0–4.4)
Cholesterol, Total: 135 mg/dL (ref 100–199)
HDL: 45 mg/dL (ref >39)
LDL Chol Calc (NIH): 68 mg/dL (ref 0–99)
Triglycerides: 124 mg/dL (ref 0–149)
VLDL Cholesterol Cal: 22 mg/dL (ref 5–40)

## 2021-05-18 LAB — TSH: TSH: 1.6 u[IU]/mL (ref 0.450–4.500)

## 2021-06-02 ENCOUNTER — Other Ambulatory Visit: Payer: Self-pay | Admitting: Cardiovascular Disease

## 2021-06-02 NOTE — Telephone Encounter (Signed)
Rx(s) sent to pharmacy electronically.  

## 2021-09-05 ENCOUNTER — Encounter (HOSPITAL_COMMUNITY): Payer: Self-pay | Admitting: *Deleted

## 2021-09-05 ENCOUNTER — Other Ambulatory Visit: Payer: Self-pay

## 2021-09-05 ENCOUNTER — Ambulatory Visit (HOSPITAL_COMMUNITY)
Admission: EM | Admit: 2021-09-05 | Discharge: 2021-09-05 | Disposition: A | Discharge status: Home / Self Care | Payer: Medicare HMO | Attending: Nurse Practitioner | Admitting: Nurse Practitioner

## 2021-09-05 DIAGNOSIS — E118 Type 2 diabetes mellitus with unspecified complications: Secondary | ICD-10-CM | POA: Diagnosis not present

## 2021-09-05 DIAGNOSIS — S161XXA Strain of muscle, fascia and tendon at neck level, initial encounter: Secondary | ICD-10-CM | POA: Diagnosis not present

## 2021-09-05 DIAGNOSIS — E119 Type 2 diabetes mellitus without complications: Secondary | ICD-10-CM

## 2021-09-05 DIAGNOSIS — Z794 Long term (current) use of insulin: Secondary | ICD-10-CM | POA: Diagnosis not present

## 2021-09-05 MED ORDER — KETOROLAC TROMETHAMINE 60 MG/2ML IM SOLN
INTRAMUSCULAR | Status: AC
  Filled 2021-09-05: qty 2

## 2021-09-05 MED ORDER — METHYLPREDNISOLONE SODIUM SUCC 125 MG IJ SOLR
INTRAMUSCULAR | Status: AC
  Filled 2021-09-05: qty 2

## 2021-09-05 MED ORDER — KETOROLAC TROMETHAMINE 60 MG/2ML IM SOLN
60.0000 mg | Freq: Once | INTRAMUSCULAR | Status: AC
  Administered 2021-09-05: 60 mg via INTRAMUSCULAR

## 2021-09-05 MED ORDER — MELOXICAM 15 MG PO TABS: 15.0000 mg | ORAL_TABLET | Freq: Every day | ORAL | 0 refills | Status: AC

## 2021-09-05 MED ORDER — METHYLPREDNISOLONE SODIUM SUCC 125 MG IJ SOLR
40.0000 mg | Freq: Once | INTRAMUSCULAR | Status: AC
  Administered 2021-09-05: 40 mg via INTRAMUSCULAR

## 2021-09-05 NOTE — ED Triage Notes (Signed)
Pt reports neck pain that radiates to Rt shoulder to arm for over a week. Pt has used OTC.

## 2021-09-05 NOTE — ED Provider Notes (Signed)
[place]    CSN: [id number] Arrival date & time: 09/05/21  1137      History   Chief Complaint No chief complaint on file.   HPI Diana [name] is a 75 y.o. female.   Subjective:   Diana [name] is a 75 y.o. female who presents for evaluation of neck pain. Patient denies any event that precipitated these symptoms but she does note that she works a lot on the computer. She woke up with these symptoms about 9 days ago. Initially, the pain was intermittent but has been gradually worsening since onset. Current symptoms are pain in posterior neck described as a constant dullness with intermittent episodes of severe sharp pain. She currently rates it 10/10 in severity at its worst. The pain radiates into the right shoulder and down the right arm. Patient denies numbness, paresthesias, stiffness, or weakness in the arms or back. Patient has had no prior neck problems. Previous treatments include ibuprofen, tylenol and heat with not much relief in symptoms.   The following portions of the patient's history were reviewed and updated as appropriate: allergies, current medications, past family history, past medical history, past social history, past surgical history, and problem list.    Past Medical History:  Diagnosis Date   CAD (coronary artery disease) 7/12-8/12   staged LAD/RCA DES   Diabetes mellitus ([place])    Diastolic dysfunction [id number]   grade 1    Dyslipidemia    HTN (hypertension)     Patient Active Problem List   Diagnosis Date Noted   COPD exacerbation ([place]) 11/13/2016   Diabetes mellitus with diabetic neuropathy ([place]) 08/24/2015   Dyslipidemia    Diastolic dysfunction-garde 1    CAD-staged LAD DES (July 2011), RCA DES (Aug 2011) 05/24/2013   DM2 (diabetes mellitus, type 2) ([place]) 05/24/2013   HTN (hypertension) 05/24/2013   Obesity (BMI 30-39.9) 05/24/2013    Past Surgical History:  Procedure Laterality Date   PERCUTANEOUS CORONARY STENT INTERVENTION  (PCI-S)  05/14/10   LAD   PERCUTANEOUS CORONARY STENT INTERVENTION (PCI-S)  05/17/10   RCA    OB History   No obstetric history on file.      Home Medications    Prior to Admission medications   Medication Sig Start Date End Date Taking? Authorizing Provider  meloxicam (MOBIC) 15 MG tablet Take 1 tablet (15 mg total) by mouth daily for 14 days. 09/05/21 09/19/21 Yes Enrique Sack, FNP  aspirin 81 MG tablet Take 81 mg by mouth daily.    [provider]  B Complex-C (SUPER B COMPLEX) TABS Take 1 tablet by mouth daily. Reported on 03/24/2016    [provider]  BD INSULIN SYRINGE U/F 1/2UNIT 31G X 5/16" 0.3 ML MISC  11/05/19   [provider]  benzonatate (TESSALON PERLES) 100 MG capsule Take 1 capsule (100 mg total) by mouth 3 (three) times daily as needed for cough. 11/13/16   Mayer Camel, MD  Biotin 5000 MCG CAPS Take 1 capsule by mouth daily.    [provider]  Calcium-Magnesium-Vitamin D (CALCIUM 500 PO) Take 1 tablet by mouth daily.    [provider]  carvedilol (COREG) 12.5 MG tablet TAKE 1 TABLET BY MOUTH TWICE A DAY 06/02/21   Troy Sine, MD  Cholecalciferol (VITAMIN D-3) 1000 UNITS CAPS Take 1 capsule by mouth daily.    [provider]  clopidogrel (PLAVIX) 75 MG tablet TAKE 1 TABLET BY MOUTH EVERY DAY 05/14/21   Troy Sine, MD  ezetimibe (ZETIA) 10 MG tablet TAKE 1 TABLET BY MOUTH EVERY DAY 04/26/21   Troy Sine, MD  fish oil-omega-3 fatty acids 1000 MG capsule Take 1 g by mouth daily.    [provider]  gabapentin (NEURONTIN) 300 MG capsule Take 300 mg by mouth 3 (three) times daily as needed. Reported on 03/24/2016 Patient not taking: Reported on 05/11/2021    [provider]  glimepiride (AMARYL) 1 MG tablet Take 1 tablet by mouth 2 (two) times daily. 12/12/16   [provider]  hydrochlorothiazide (HYDRODIURIL) 25 MG tablet  09/18/19   [provider]  insulin NPH-regular  Human (NOVOLIN 70/30) (70-30) 100 UNIT/ML injection INJECT 15 UNITS SUBCUTANEOUSLY TWICE DAILY. MAX 50 UNITS DAILY DOSE 10/09/19   [provider]  losartan-hydrochlorothiazide (HYZAAR) 50-12.5 MG tablet TAKE 1 TABLET BY MOUTH EVERY DAY. NEED APPT FOR REFILLS 05/13/21   Troy Sine, MD  metFORMIN (GLUCOPHAGE) 1000 MG tablet Take 500 mg by mouth 2 (two) times daily with a meal.  04/29/13   [provider]  naproxen (NAPROSYN) 500 MG tablet Take 1 tablet (500 mg total) by mouth 2 (two) times daily with a meal. 11/13/16   Mayer Camel, MD  nitroGLYCERIN (NITROSTAT) 0.4 MG SL tablet Place 1 tablet (0.4 mg total) under the tongue every 5 (five) minutes as needed for chest pain. 05/11/21 08/09/21  Troy Sine, MD  NOVOLIN 70/30 RELION (70-30) 100 UNIT/ML injection  10/14/19   [provider]  ondansetron (ZOFRAN ODT) 4 MG disintegrating tablet Take 1 tablet (4 mg total) by mouth every 8 (eight) hours as needed for nausea or vomiting. Patient not taking: Reported on 05/11/2021 11/13/16   Mayer Camel, MD  [facility], 1 MG/DOSE, 2 MG/1.5ML [facility]  10/29/19   [provider]  pantoprazole (PROTONIX) 40 MG tablet Take 1 tablet by mouth daily. Reported on 03/24/2016 05/06/13   [provider]  rosuvastatin (CRESTOR) 10 MG tablet TAKE 1 TABLET BY MOUTH ONE TIME PER WEEK 10/19/20   Troy Sine, MD    Family History Family History  Problem Relation Age of Onset   CAD Neg Hx     Social History Social History   Tobacco Use   Smoking status: Never   Smokeless tobacco: Never  Substance Use Topics   Alcohol use: Yes    Comment: Occasionally-very rare     Allergies   Lisinopril, Other, and Sulfa antibiotics   Review of Systems Review of Systems  Musculoskeletal:  Positive for neck pain. Negative for neck stiffness.  Neurological:  Negative for dizziness, weakness, numbness and headaches.    Physical Exam Triage Vital Signs ED Triage Vitals  [09/05/21 1336]  Enc Vitals Group     BP (!) 158/84     Pulse Rate 86     Resp 18     Temp 97.8 F (36.6 C)     Temp src      SpO2 95 %     Weight      Height      Head Circumference      Peak Flow      Pain Score      Pain Loc      Pain Edu?      Excl. in [place]?    No data found.  Updated Vital Signs BP (!) 158/84   Pulse 86   Temp 97.8 F (36.6 C)   Resp 18   SpO2 95%   Visual Acuity Right Eye  Distance:   Left Eye Distance:   Bilateral Distance:    Right Eye Near:   Left Eye Near:    Bilateral Near:     Physical Exam Vitals reviewed.  Constitutional:      Appearance: Normal appearance.  HENT:     Head: Normocephalic.  Cardiovascular:     Rate and Rhythm: Normal rate.  Pulmonary:     Effort: Pulmonary effort is normal.     Breath sounds: Normal breath sounds.  Musculoskeletal:        General: Normal range of motion.     Cervical back: Normal range of motion and neck supple. Tenderness present. No swelling, edema, erythema, signs of trauma, rigidity, spasms or crepitus. Pain with movement and muscular tenderness present. No spinous process tenderness. Normal range of motion.  Skin:    General: Skin is warm and dry.  Neurological:     General: No focal deficit present.     Mental Status: She is alert and oriented to person, place, and time.  Psychiatric:        Mood and Affect: Mood normal.        Behavior: Behavior normal.     UC Treatments / Results  Labs (all labs ordered are listed, but only abnormal results are displayed) Labs Reviewed - No data to display  EKG   Radiology No results found.  Procedures Procedures (including critical care time)  Medications Ordered in UC Medications  methylPREDNISolone sodium succinate (SOLU-MEDROL) 125 mg/2 mL injection 40 mg (has no administration in time range)  ketorolac (TORADOL) injection 60 mg (has no administration in time range)    Initial Impression / Assessment and Plan / UC Course  I have  reviewed the triage vital signs and the nursing notes.  Pertinent labs & imaging results that were available during my care of the patient were reviewed by me and considered in my medical decision making (see chart for details).     75 yo female with a history of insulin dependent type II DM that presents with posterior neck pain without any known injury. She has radiation of the pain to the right shoulder and down right arm but no numbness, paresthesias, stiffness, or weakness in the arms or back. No prior history of neck pain. No relief with supportive measures. Patient AAOx3. Afebrile. No focal deficits noted one exam. No indication for imaging at this time. Plan to treat supportively. Solumedrol 40 mg IM given in clinic.   Plan:  Discussed the cervical pain, its course and treatment. Agricultural engineer distributed. Discussed appropriate exercises. Discussed appropriate use of heat. NSAIDs per medication orders. Monitor glucose closely  Follow-up with PCP if no improvement in symptoms for imaging and/or orthopedic referral   Today's evaluation has revealed no signs of a dangerous process. Discussed diagnosis with patient and/or guardian. Patient and/or guardian aware of their diagnosis, possible red flag symptoms to watch out for and need for close follow up. Patient and/or guardian understands verbal and written discharge instructions. Patient and/or guardian comfortable with plan and disposition.  Patient and/or guardian has a clear mental status at this time, good insight into illness (after discussion and teaching) and has clear judgment to make decisions regarding their care  This care was provided during an unprecedented National Emergency due to the Novel Coronavirus (COVID-19) pandemic. COVID-19 infections and transmission risks place heavy strains on healthcare resources.  As this pandemic evolves, our facility, providers, and staff strive to respond fluidly, to remain operational,  and  to provide care relative to available resources and information. Outcomes are unpredictable and treatments are without well-defined guidelines. Further, the impact of COVID-19 on all aspects of urgent care, including the impact to patients seeking care for reasons other than COVID-19, is unavoidable during this national emergency. At this time of the global pandemic, management of patients has significantly changed, even for non-COVID positive patients given high local and regional COVID volumes at this time requiring high healthcare system and resource utilization. The standard of care for management of both COVID suspected and non-COVID suspected patients continues to change rapidly at the local, regional, national, and global levels. This patient was worked up and treated to the best available but ever changing evidence and resources available at this current time.   Documentation was completed with the aid of voice recognition software. Transcription may contain typographical errors.  Final Clinical Impressions(s) / UC Diagnoses   Final diagnoses:  Cervical strain, acute, initial encounter  Insulin dependent type 2 diabetes mellitus ([place])     Discharge Instructions      Cervical strain may result from the physical stresses of everyday life, including poor posture, muscle tension from psychologic stress, or poor sleeping habits. Typically, cervical strain symptoms include pain, stiffness, and tightness in the upper back or shoulder, which may last for up to six weeks.  Take medications as prescribed for the next two weeks. You may also take extra strength tylenol in addition to this if needed. Do not take any NSAIDs such as motrin, advil, motrin, aleve or naprosyn. Make sure you monitor your blood sugars closely. We gave you an injection of steroids here in the clinic which can cause a brief elevation in your sugars. Continue to take all medications for your diabetes as prescribed as well.    Continue heat for 10-15 minutes 2-3 times a day.   Stretching exercises can help restore and preserve your range of motion with exercises that stretch and strengthen the neck muscles. It is best to perform stretching exercises when the muscles are warm, such as after the application of heat. Expect mild, achy muscle pain if you are new to exercise.  Exercises are most effective if you do them twice a day, in the morning and before bed. See the attached handout for specific information on how to perform these exercises.   Get help right away if: Your pain gets worse and medicine does not help. You lose feeling or feel weak in your hand, arm, face, or leg. You have a high fever. Your neck is stiff. You cannot control when you poop or pee (have incontinence). You have trouble with walking, balance, or talking.         ED Prescriptions     Medication Sig Dispense Auth. Provider   meloxicam (MOBIC) 15 MG tablet Take 1 tablet (15 mg total) by mouth daily for 14 days. 14 tablet Enrique Sack, FNP      PDMP not reviewed this encounter.   Enrique Sack, [place] 09/05/21 1642

## 2021-09-05 NOTE — Discharge Instructions (Addendum)
Cervical strain may result from the physical stresses of everyday life, including poor posture, muscle tension from psychologic stress, or poor sleeping habits. Typically, cervical strain symptoms include pain, stiffness, and tightness in the upper back or shoulder, which may last for up to six weeks.  Take medications as prescribed for the next two weeks. You may also take extra strength tylenol in addition to this if needed. Do not take any NSAIDs such as motrin, advil, motrin, aleve or naprosyn. Make sure you monitor your blood sugars closely. We gave you an injection of steroids here in the clinic which can cause a brief elevation in your sugars. Continue to take all medications for your diabetes as prescribed as well.   Continue heat for 10-15 minutes 2-3 times a day.   Stretching exercises can help restore and preserve your range of motion with exercises that stretch and strengthen the neck muscles. It is best to perform stretching exercises when the muscles are warm, such as after the application of heat. Expect mild, achy muscle pain if you are new to exercise.  Exercises are most effective if you do them twice a day, in the morning and before bed. See the attached handout for specific information on how to perform these exercises.   Get help right away if: Your pain gets worse and medicine does not help. You lose feeling or feel weak in your hand, arm, face, or leg. You have a high fever. Your neck is stiff. You cannot control when you poop or pee (have incontinence). You have trouble with walking, balance, or talking.

## 2021-09-13 ENCOUNTER — Other Ambulatory Visit (HOSPITAL_BASED_OUTPATIENT_CLINIC_OR_DEPARTMENT_OTHER): Payer: Self-pay

## 2021-09-13 MED ORDER — OZEMPIC (0.25 OR 0.5 MG/DOSE) 2 MG/1.5ML ~~LOC~~ SOPN
PEN_INJECTOR | SUBCUTANEOUS | 3 refills | Status: DC
Start: 2021-09-13 — End: 2022-05-13
  Filled 2021-09-13 (×2): qty 1.5, 28d supply, fill #0

## 2021-09-29 ENCOUNTER — Other Ambulatory Visit: Payer: Self-pay | Admitting: Cardiovascular Disease

## 2021-11-18 ENCOUNTER — Other Ambulatory Visit (HOSPITAL_BASED_OUTPATIENT_CLINIC_OR_DEPARTMENT_OTHER): Payer: Self-pay

## 2021-11-18 MED ORDER — OZEMPIC (0.25 OR 0.5 MG/DOSE) 2 MG/1.5ML ~~LOC~~ SOPN
PEN_INJECTOR | SUBCUTANEOUS | 3 refills | Status: DC
  Filled 2021-11-18: qty 1.5, 28d supply, fill #0
  Filled 2022-01-11: qty 1.5, 28d supply, fill #1

## 2021-11-19 ENCOUNTER — Other Ambulatory Visit (HOSPITAL_BASED_OUTPATIENT_CLINIC_OR_DEPARTMENT_OTHER): Payer: Self-pay

## 2021-12-12 ENCOUNTER — Other Ambulatory Visit: Payer: Self-pay | Admitting: Cardiovascular Disease

## 2022-01-03 ENCOUNTER — Other Ambulatory Visit: Payer: Self-pay | Admitting: Family Medicine

## 2022-01-03 DIAGNOSIS — Z1231 Encounter for screening mammogram for malignant neoplasm of breast: Secondary | ICD-10-CM

## 2022-01-04 ENCOUNTER — Ambulatory Visit
Admission: RE | Admit: 2022-01-04 | Discharge: 2022-01-04 | Disposition: A | Discharge status: Home / Self Care | Payer: Medicare HMO | Source: Ambulatory Visit | Attending: Family Medicine | Admitting: Family Medicine

## 2022-01-04 DIAGNOSIS — Z1231 Encounter for screening mammogram for malignant neoplasm of breast: Secondary | ICD-10-CM

## 2022-01-11 ENCOUNTER — Other Ambulatory Visit (HOSPITAL_BASED_OUTPATIENT_CLINIC_OR_DEPARTMENT_OTHER): Payer: Self-pay

## 2022-03-09 ENCOUNTER — Other Ambulatory Visit (HOSPITAL_BASED_OUTPATIENT_CLINIC_OR_DEPARTMENT_OTHER): Payer: Self-pay

## 2022-03-09 MED ORDER — OZEMPIC (0.25 OR 0.5 MG/DOSE) 2 MG/3ML ~~LOC~~ SOPN
PEN_INJECTOR | SUBCUTANEOUS | 2 refills | Status: DC
  Filled 2022-03-09: qty 3, 28d supply, fill #0
  Filled 2022-04-13: qty 3, 28d supply, fill #1
  Filled 2022-05-11: qty 3, 28d supply, fill #2

## 2022-04-13 ENCOUNTER — Other Ambulatory Visit (HOSPITAL_BASED_OUTPATIENT_CLINIC_OR_DEPARTMENT_OTHER): Payer: Self-pay

## 2022-05-01 ENCOUNTER — Other Ambulatory Visit: Payer: Self-pay | Admitting: Cardiovascular Disease

## 2022-05-11 ENCOUNTER — Other Ambulatory Visit (HOSPITAL_BASED_OUTPATIENT_CLINIC_OR_DEPARTMENT_OTHER): Payer: Self-pay

## 2022-05-13 ENCOUNTER — Encounter: Payer: Self-pay | Admitting: Cardiovascular Disease

## 2022-05-13 ENCOUNTER — Ambulatory Visit: Payer: Medicare HMO | Admitting: Cardiovascular Disease

## 2022-05-13 DIAGNOSIS — I2510 Atherosclerotic heart disease of native coronary artery without angina pectoris: Secondary | ICD-10-CM

## 2022-05-13 DIAGNOSIS — E118 Type 2 diabetes mellitus with unspecified complications: Secondary | ICD-10-CM | POA: Diagnosis not present

## 2022-05-13 DIAGNOSIS — E668 Other obesity: Secondary | ICD-10-CM

## 2022-05-13 DIAGNOSIS — E785 Hyperlipidemia, unspecified: Secondary | ICD-10-CM

## 2022-05-13 DIAGNOSIS — I10 Essential (primary) hypertension: Secondary | ICD-10-CM

## 2022-05-13 DIAGNOSIS — Z794 Long term (current) use of insulin: Secondary | ICD-10-CM

## 2022-05-13 NOTE — Progress Notes (Unsigned)
Positive blood pressure pressures okay monitor blood sugar blood pressure sure no problem patient ID: Diana [name], female   DOB: 10-Nov-1945, 76 y.o.   MRN: [id number]      HPI: Diana [name] is a 76 y.o. female presents to the office today for a 12 month cardiology follow up evaluation.  Diana [name] has CAD and in July/August 2011 she underwent staged intervention with PTCA and stenting of a 95% ostial LAD stenosis (3.5x12 mm promiscuity a stent) postdilated 3.75 mm, end-stage intervention to) artery with tandem 3.0x28 and 2.5x23 mm promiscuity. She has a history of hyperlipidemia. In January 2012 while on Crestor she developed mild transaminase elevation leading to its discontinuance.  She has a history of diabetes mellitus which has been poorly controlled, GERD,  remote history of pneumonia. An echo Doppler study in March 2014 showed normal systolic function with grade 1 diastolic dysfunction. She did have mild aortic sclerosis with mild MR and mild LA dilation. I scheduled her for a two-year followup nuclear perfusion study but insurance I denied this.  Laboratory in April 2014 showed a glucose of 169. At that time, her cholesterol was 166 LDL 81 triglycerides were 192 and hemoglobin A1c was 9.0. Her primary physician is increased her insulin. An NMR evaluation showed an LDL cholesterol of 70 but due to her increased small LDL particles and 1086, LDL particle number was still significantly increased at 1383. HDL was 44 triglycerides are still elevated at 180. Recent hemoglobin A1c was 87. ,  She was started on Zetia, which she had tolerated.  Diana [name] underwent a nuclear perfusion study on 06/04/2013 which continued to show normal perfusion and function. Ejection fraction was 70%. There was no evidence for scar or ischemia.    When I saw her in March 2018  she was without chest pain or shortness of breath.  She was no longer taking insulin for her diabetes mellitus.    I saw her in February  2019.  At that time she remained stable and was without chest pain.   She has been on Zetia 10 mg at fish oil for hyperlipidemia.  A  lipid panel 1 week prior to that evaluation had shown a total cholesterol 164, HDL 46, triglycerides 140, and LDL 94.  She has continued to take aspirin and Plavix.  She is on losartan HCT 50/12.5 and carvedilol 12.5 mm twice a day for blood pressure and her CAD.  She was on metformin 500 mg twice a day, glimepiride 1 mg twice a day, and Actos 15 mg for diabetes mellitus.  She has a peripheral neuropathy on gabapentin.  GERD is controlled with Protonix.  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  She obtains fiber, and is involved in breeding, boarding, and rescues other animals.    I saw her in February 2021 after not having seen her in 2 years.  At that time she was continuing to do well and was without chest pain or shortness of breath.  She was now seeing Dr. Iran Planas for endocrinology and continues to be on metformin, glimepiride and was recently started on Ozempic.  She was no longer on pioglitazone.  She has continued to be on Zetia and rosuvastatin 10 mg for hyperlipidemia and  DAPT with aspirin/Plavix.  She noticed rare episodes of lightheadedness but denies any orthostatic symptoms.  She was unaware of palpitations.  Since I last saw her, she has continued to  see Dr. Iran Planas at [facility] for her endocrinology care.  She states recently her hemoglobin A1c had risen to 12.  She continues to be on aspirin and Plavix for DAPT.  She is on Zetia and rosuvastatin for hyperlipidemia.  She has not had any chest pain and continues to be on carvedilol 12.5 mg, losartan HCT 50/12.5 mg for hypertension.  She is on metformin in addition to glimepiride and Ozempic now for her diabetes mellitus.  She presents for evaluation,   Past Medical History:  Diagnosis Date   CAD (coronary artery disease) 7/12-8/12   staged LAD/RCA DES    Diabetes mellitus ([place])    Diastolic dysfunction 7/37   grade 1    Dyslipidemia    HTN (hypertension)     Past Surgical History:  Procedure Laterality Date   PERCUTANEOUS CORONARY STENT INTERVENTION (PCI-S)  05/14/10   LAD   PERCUTANEOUS CORONARY STENT INTERVENTION (PCI-S)  05/17/10   RCA    Allergies  Allergen Reactions   Lisinopril     Cough   Other Hives   Sulfa Antibiotics Hives    Current Outpatient Medications  Medication Sig Dispense Refill   aspirin 81 MG tablet Take 81 mg by mouth daily.     B Complex-C (SUPER B COMPLEX) TABS Take 1 tablet by mouth daily. Reported on 03/24/2016     BD INSULIN SYRINGE U/F 1/2UNIT 31G X 5/16" 0.3 ML MISC      benzonatate (TESSALON PERLES) 100 MG capsule Take 1 capsule (100 mg total) by mouth 3 (three) times daily as needed for cough. 20 capsule 0   Beta Carotene (VITAMIN A) 25000 UNIT capsule Take 25,000 Units by mouth daily.     Biotin 5000 MCG CAPS Take 1 capsule by mouth daily.     Calcium-Magnesium-Vitamin D (CALCIUM 500 PO) Take 1 tablet by mouth daily.     carvedilol (COREG) 25 MG tablet SMARTSIG:1 Tablet(s) By Mouth Every 12 Hours     Cholecalciferol (VITAMIN D-3) 1000 UNITS CAPS Take 1 capsule by mouth daily.     clopidogrel (PLAVIX) 75 MG tablet TAKE 1 TABLET BY MOUTH EVERY DAY 90 tablet 3   ezetimibe (ZETIA) 10 MG tablet TAKE 1 TABLET BY MOUTH EVERY DAY 90 tablet 2   fish oil-omega-3 fatty acids 1000 MG capsule Take 1 g by mouth daily.     gabapentin (NEURONTIN) 100 MG capsule Take 100 mg by mouth daily.     glimepiride (AMARYL) 1 MG tablet Take 1 tablet by mouth 2 (two) times daily.     hydrochlorothiazide (HYDRODIURIL) 25 MG tablet      insulin NPH-regular Human (NOVOLIN 70/30) (70-30) 100 UNIT/ML injection Patient inject 60 units twice at day.     loratadine (CLARITIN) 10 MG tablet Take by mouth.     losartan-hydrochlorothiazide (HYZAAR) 50-12.5 MG tablet TAKE 1 TABLET BY MOUTH EVERY DAY. NEED APPT FOR REFILLS 90 tablet 3    metFORMIN (GLUCOPHAGE) 1000 MG tablet Take 500 mg by mouth 2 (two) times daily with a meal.      naproxen (NAPROSYN) 500 MG tablet Take 1 tablet (500 mg total) by mouth 2 (two) times daily with a meal. 14 tablet 0   nitroGLYCERIN (NITROSTAT) 0.4 MG SL tablet Place 1 tablet (0.4 mg total) under the tongue every 5 (five) minutes as needed for chest pain. 25 tablet 3   ondansetron (ZOFRAN ODT) 4 MG disintegrating tablet Take 1 tablet (4 mg total) by mouth every 8 (eight) hours as  needed for nausea or vomiting. 10 tablet 0   pantoprazole (PROTONIX) 40 MG tablet Take 1 tablet by mouth daily. Reported on 03/24/2016     rosuvastatin (CRESTOR) 10 MG tablet TAKE 1 TABLET BY MOUTH ONE TIME PER WEEK 12 tablet 3   Semaglutide,0.25 or 0.5MG /DOS, (OZEMPIC, 0.25 OR 0.5 MG/DOSE,) 2 MG/3ML SOPN Inject 0.5 mg into the skin once weekly. 3 mL 2   No current facility-administered medications for this visit.    Social History   Socioeconomic History   Marital status: Married    Spouse name: Not on file   Number of children: Not on file   Years of education: Not on file   Highest education level: Not on file  Occupational History   Not on file  Tobacco Use   Smoking status: Never   Smokeless tobacco: Never  Substance and Sexual Activity   Alcohol use: Yes    Comment: Occasionally-very rare   Drug use: Not on file   Sexual activity: Not on file  Other Topics Concern   Not on file  Social History Narrative   Not on file   Social Determinants of Health   Financial Resource Strain: Not on file  Food Insecurity: Not on file  Transportation Needs: Not on file  Physical Activity: Not on file  Stress: Not on file  Social Connections: Not on file  Intimate Partner Violence: Not on file    ROS General: Negative; No fevers, chills, or night sweats;  HEENT: Negative; No changes in vision or hearing, sinus congestion, difficulty swallowing Pulmonary: Negative; No cough, wheezing, shortness of breath,  hemoptysis Cardiovascular: See history of present illness GI: Negative; No nausea, vomiting, diarrhea, or abdominal pain GU: Negative; No dysuria, hematuria, or difficulty voiding Musculoskeletal: Negative; no myalgias, joint pain, or weakness Hematologic/Oncology: Negative; no easy bruising, bleeding Endocrine: Positive for diabetes; no heat/cold intolerance; Neuro: Negative; no changes in balance, headaches Skin: Negative; No rashes or skin lesions Psychiatric: Negative; No behavioral problems, depression Sleep: Negative; No snoring, daytime sleepiness, hypersomnolence, bruxism, restless legs, hypnogognic hallucinations, no cataplexy Other comprehensive 14 point system review is negative.   PE BP 120/84   Pulse 84   Ht 5\' 4"  (1.626 m)   Wt 209 lb (94.8 kg)   SpO2 96%   BMI 35.87 kg/m    Repeat blood pressure by me was 118/75  Wt Readings from Last 3 Encounters:  05/13/22 209 lb (94.8 kg)  05/11/21 205 lb 6.4 oz (93.2 kg)  11/19/19 200 lb 6.4 oz (90.9 kg)     Physical Exam BP 120/84   Pulse 84   Ht 5\' 4"  (1.626 m)   Wt 209 lb (94.8 kg)   SpO2 96%   BMI 35.87 kg/m  General: Alert, oriented, no distress.  Skin: normal turgor, no rashes, warm and dry HEENT: Normocephalic, atraumatic. Pupils equal round and reactive to light; sclera anicteric; extraocular muscles intact; Fundi ** Nose without nasal septal hypertrophy Mouth/Parynx benign; Mallinpatti scale Neck: No JVD, no carotid bruits; normal carotid upstroke Lungs: clear to ausculatation and percussion; no wheezing or rales Chest wall: without tenderness to palpitation Heart: PMI not displaced, RRR, s1 s2 normal, 1/6 systolic murmur, no diastolic murmur, no rubs, gallops, thrills, or heaves Abdomen: soft, nontender; no hepatosplenomehaly, BS+; abdominal aorta nontender and not dilated by palpation. Back: no CVA tenderness Pulses 2+ Musculoskeletal: full range of motion, normal strength, no joint  deformities Extremities: no clubbing cyanosis or edema, Homan's sign negative  Neurologic: grossly nonfocal;  Cranial nerves grossly wnl Psychologic: Normal mood and affect    General: Alert, oriented, no distress.  Skin: normal turgor, no rashes, warm and dry HEENT: Normocephalic, atraumatic. Pupils equal round and reactive to light; sclera anicteric; extraocular muscles intact;  Nose without nasal septal hypertrophy Mouth/Parynx benign; Mallinpatti scale Neck: No JVD, no carotid bruits; normal carotid upstroke Lungs: clear to ausculatation and percussion; no wheezing or rales Chest wall: without tenderness to palpitation Heart: PMI not displaced, RRR, s1 s2 normal, 1/6 systolic murmur, no diastolic murmur, no rubs, gallops, thrills, or heaves Abdomen: soft, nontender; no hepatosplenomehaly, BS+; abdominal aorta nontender and not dilated by palpation. Back: no CVA tenderness Pulses 2+ Musculoskeletal: full range of motion, normal strength, no joint deformities Extremities: no clubbing cyanosis or edema, Homan's sign negative  Neurologic: grossly nonfocal; Cranial nerves grossly wnl Psychologic: Normal mood and affect  May 13, 2022 ECG (independently read by me): NSR at 84, no ectopy, no STT changes, normal intervals  May 11, 2021 ECG (independently read by me): Normal sinus rhythm at 87 bpm.  No ectopy.  Normal intervals.  February 2021 ECG (independently read by me): Normal sinus rhythm at 82; no ectopy  February 2019 ECG (independently read by me): Normal sinus rhythm at 72 bpm.  Borderline left atrial enlargement.  No ST segment changes.  Normal intervals.QTc 464 msec  March 2018 ECG (independently read by me): Normal sinus rhythm at 72 bpm.  No ectopy.  Normal intervals.  November 2016 ECG (independently read by me): Normal sinus rhythm at 85 bpm.  Normal intervals.  No significant ST segment changes.  May 2015 ECG (independently read by me): Normal sinus rhythm at 74 beats  per minute.  Mild T wave changes V1, V2, and V3.  LABS:     Latest Ref Rng & Units 05/18/2021    9:03 AM 10/29/2019    8:36 AM 11/17/2017    8:33 AM  BMP  Glucose 65 - 99 mg/dL 201  163  218   BUN 8 - 27 mg/dL $Remove'11  17  22   'JxkEcbg$ Creatinine 0.57 - 1.00 mg/dL 0.74  0.82  0.77   BUN/Creat Ratio 12 - $Re'28 15  21  'faD$ NOT APPLICABLE   Sodium 786 - 144 mmol/L 141  137  136   Potassium 3.5 - 5.2 mmol/L 4.3  4.5  4.4   Chloride 96 - 106 mmol/L 101  96  100   CO2 20 - 29 mmol/L $RemoveB'24  24  29   'miUAQxZl$ Calcium 8.7 - 10.3 mg/dL 9.2  9.6  9.2       Latest Ref Rng & Units 05/18/2021    9:03 AM 10/29/2019    8:36 AM 11/17/2017    8:33 AM  Hepatic Function  Total Protein 6.0 - 8.5 g/dL 6.9  7.3  6.9   Albumin 3.7 - 4.7 g/dL 4.3  4.4    AST 0 - 40 IU/L $Remov'21  16  11   'ouJMQj$ ALT 0 - 32 IU/L $Remov'31  26  25   'LQlYLw$ Alk Phosphatase 44 - 121 IU/L 48  52    Total Bilirubin 0.0 - 1.2 mg/dL <0.2  0.2  0.4       Latest Ref Rng & Units 05/18/2021    9:03 AM 12/21/2016    9:12 AM 11/13/2016    4:26 PM  CBC  WBC 3.4 - 10.8 x10E3/uL 5.6  4.9  6.1   Hemoglobin 11.1 - 15.9 g/dL 13.2  12.9  12.9  Hematocrit 34.0 - 46.6 % 39.7  38.6  39.1   Platelets 150 - 450 x10E3/uL 322  236  197    Lab Results  Component Value Date   MCV 84 05/18/2021   MCV 87.5 12/21/2016   MCV 87.1 11/13/2016   Lab Results  Component Value Date   TSH 1.600 05/18/2021    Lipid Panel     Component Value Date/Time   CHOL 135 05/18/2021 0903   CHOL 122 02/07/2014 0902   TRIG 124 05/18/2021 0903   TRIG 160 (H) 02/07/2014 0902   HDL 45 05/18/2021 0903   HDL 46 02/07/2014 0902   CHOLHDL 3.0 05/18/2021 0903   CHOLHDL 3.6 11/17/2017 0833   VLDL 26 12/21/2016 0912   LDLCALC 68 05/18/2021 0903   LDLCALC 94 11/17/2017 0833   LDLCALC 44 02/07/2014 0902    RADIOLOGY: No results found.   IMPRESSION:  No diagnosis found.   ASSESSMENT AND PLAN: Diana [name] is a 76 year old female who underwent two-vessel intervention including a 95% ostial LAD stenosis with PTCA  stenting and tandem stenting to her RCA in 2011. A nuclear perfusion study in 2014 remained low risk.   She has a history of moderate obesity, diabetes mellitus, and peripheral neuropathy.  She is not having any recurrent anginal symptomatology.  Blood pressure today is stable and at target on carvedilol 12.5 mg twice a day, and losartan HCT 50/12.5 mg daily.  She recently had poorly controlled diabetes and is seeing Dr. Tamala Julian at [facility].  She is now on Ozempic, in addition to metformin and glimepiride and insulin.  She continues to be on Zetia and rosuvastatin for hyperlipidemia.  She has not had recent laboratory and I am recommending a comprehensive metabolic panel, CBC, TSH and lipid studies.  He continues to be moderately obese the BMI of 35.26 and weight loss and increased exercise was recommended.  She states she walks at her job but other than that does not routinely exercise.  She sees Dr. Tamsen Roers for primary care.  I will contact her regarding her laboratory results and adjustments will be made if necessary.  I will see her in 1 year for reevaluation   Troy Sine, MD, [facility]  05/13/2022 8:24 AM

## 2022-05-13 NOTE — Patient Instructions (Signed)
Medication Instructions:  The current medical regimen is effective;  continue present plan and medications.  *If you need a refill on your cardiac medications before your next appointment, please call your pharmacy*   Lab Work: CBC, CMET, LIPID, TSH, A1C, LPa today   If you have labs (blood work) drawn today and your tests are completely normal, you will receive your results only by: MyChart Message (if you have MyChart) OR A paper copy in the mail If you have any lab test that is abnormal or we need to change your treatment, we will call you to review the results.   Follow-Up: At [facility], you and your health needs are our priority.  As part of our continuing mission to provide you with exceptional heart care, we have created designated Provider Care Teams.  These Care Teams include your primary Cardiologist (physician) and Advanced Practice Providers (APPs -  Physician Assistants and Nurse Practitioners) who all work together to provide you with the care you need, when you need it.  We recommend signing up for the patient portal called "MyChart".  Sign up information is provided on this After Visit Summary.  MyChart is used to connect with patients for Virtual Visits (Telemedicine).  Patients are able to view lab/test results, encounter notes, upcoming appointments, etc.  Non-urgent messages can be sent to your provider as well.   To learn more about what you can do with MyChart, go to [email or web address].    Your next appointment:   12 month(s)  The format for your next appointment:   In Person  Provider:   Nicki Guadalajara, MD{

## 2022-05-14 LAB — COMPREHENSIVE METABOLIC PANEL
ALT: 28 IU/L (ref 0–32)
AST: 17 IU/L (ref 0–40)
Albumin/Globulin Ratio: 1.7 (ref 1.2–2.2)
Albumin: 4.3 g/dL (ref 3.8–4.8)
Alkaline Phosphatase: 64 IU/L (ref 44–121)
BUN/Creatinine Ratio: 20 (ref 12–28)
BUN: 18 mg/dL (ref 8–27)
Bilirubin Total: <0.2 mg/dL (ref 0.0–1.2)
CO2: 25 mmol/L (ref 20–29)
Calcium: 9.3 mg/dL (ref 8.7–10.3)
Chloride: 97 mmol/L (ref 96–106)
Creatinine, Ser: 0.88 mg/dL (ref 0.57–1.00)
Globulin, Total: 2.6 g/dL (ref 1.5–4.5)
Glucose: 150 mg/dL — ABNORMAL HIGH (ref 70–99)
Potassium: 4.5 mmol/L (ref 3.5–5.2)
Sodium: 137 mmol/L (ref 134–144)
Total Protein: 6.9 g/dL (ref 6.0–8.5)
eGFR: 69 mL/min/{1.73_m2} (ref >59)

## 2022-05-14 LAB — LIPID PANEL
Chol/HDL Ratio: 3.3 ratio (ref 0.0–4.4)
Cholesterol, Total: 127 mg/dL (ref 100–199)
HDL: 38 mg/dL — ABNORMAL LOW (ref >39)
LDL Chol Calc (NIH): 57 mg/dL (ref 0–99)
Triglycerides: 196 mg/dL — ABNORMAL HIGH (ref 0–149)
VLDL Cholesterol Cal: 32 mg/dL (ref 5–40)

## 2022-05-14 LAB — CBC
Hematocrit: 36.4 % (ref 34.0–46.6)
Hemoglobin: 12.2 g/dL (ref 11.1–15.9)
MCH: 28.3 pg (ref 26.6–33.0)
MCHC: 33.5 g/dL (ref 31.5–35.7)
MCV: 85 fL (ref 79–97)
Platelets: 331 10*3/uL (ref 150–450)
RBC: 4.31 x10E6/uL (ref 3.77–5.28)
RDW: 13.5 % (ref 11.7–15.4)
WBC: 6.8 10*3/uL (ref 3.4–10.8)

## 2022-05-14 LAB — HEMOGLOBIN A1C
Est. average glucose Bld gHb Est-mCnc: 197 mg/dL
Hgb A1c MFr Bld: 8.5 % — ABNORMAL HIGH (ref 4.8–5.6)

## 2022-05-14 LAB — TSH: TSH: 1.94 u[IU]/mL (ref 0.450–4.500)

## 2022-05-14 LAB — LIPOPROTEIN A (LPA): Lipoprotein (a): 24.6 nmol/L (ref <75.0)

## 2022-05-15 ENCOUNTER — Encounter: Payer: Self-pay | Admitting: Cardiovascular Disease

## 2022-06-13 ENCOUNTER — Other Ambulatory Visit (HOSPITAL_BASED_OUTPATIENT_CLINIC_OR_DEPARTMENT_OTHER): Payer: Self-pay

## 2022-06-13 MED ORDER — OZEMPIC (0.25 OR 0.5 MG/DOSE) 2 MG/3ML ~~LOC~~ SOPN
PEN_INJECTOR | SUBCUTANEOUS | 2 refills | Status: DC
  Filled 2022-06-13: qty 3, 28d supply, fill #0
  Filled 2022-07-09: qty 3, 28d supply, fill #1
  Filled 2022-12-06: qty 3, 28d supply, fill #2

## 2022-06-14 IMAGING — MG MM DIGITAL SCREENING BILAT W/ TOMO AND CAD
8 series · 8 of 24 positions shown · non-contrast
Comparison: Previous exam(s).

CLINICAL DATA: Screening.

EXAM:
DIGITAL SCREENING BILATERAL MAMMOGRAM WITH TOMOSYNTHESIS AND CAD
TECHNIQUE: Bilateral screening digital craniocaudal and mediolateral oblique
mammograms were obtained. Bilateral screening digital breast
tomosynthesis was performed. The images were evaluated with
computer-aided detection.

[L CC synth-2D]
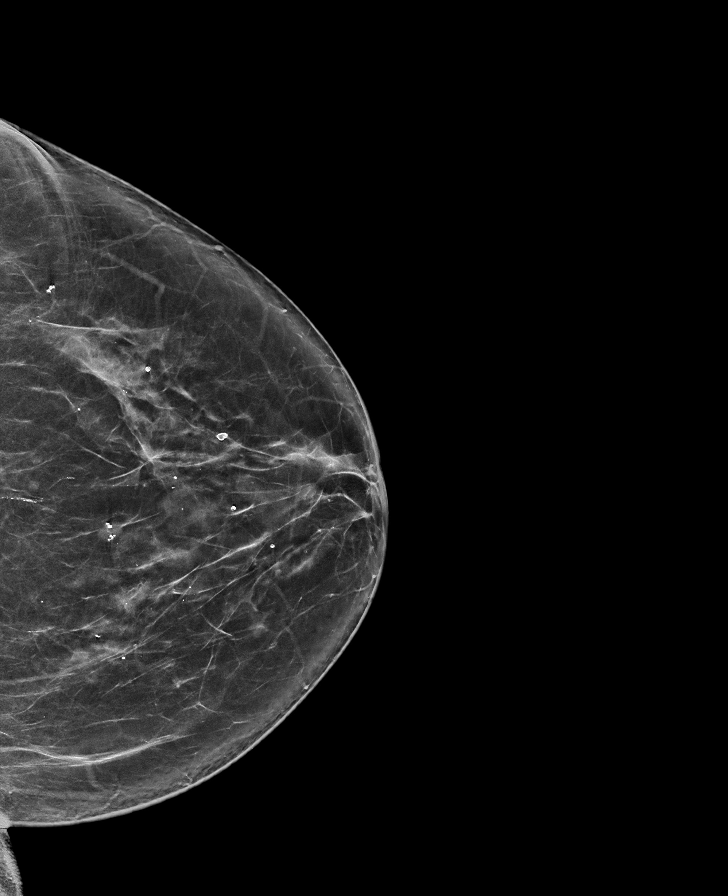

[R CC synth-2D]
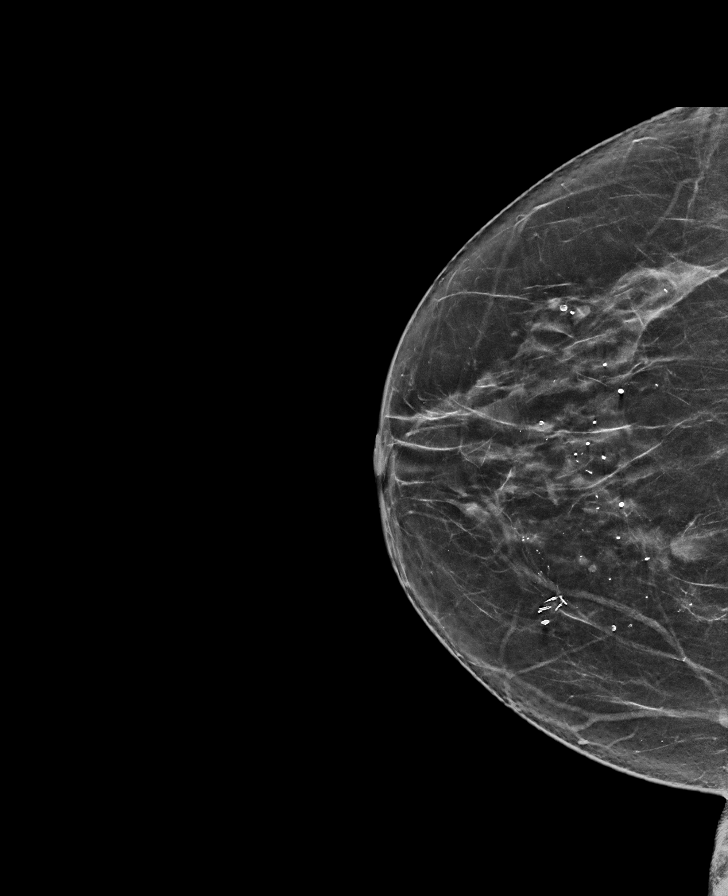

[L MLO synth-2D]
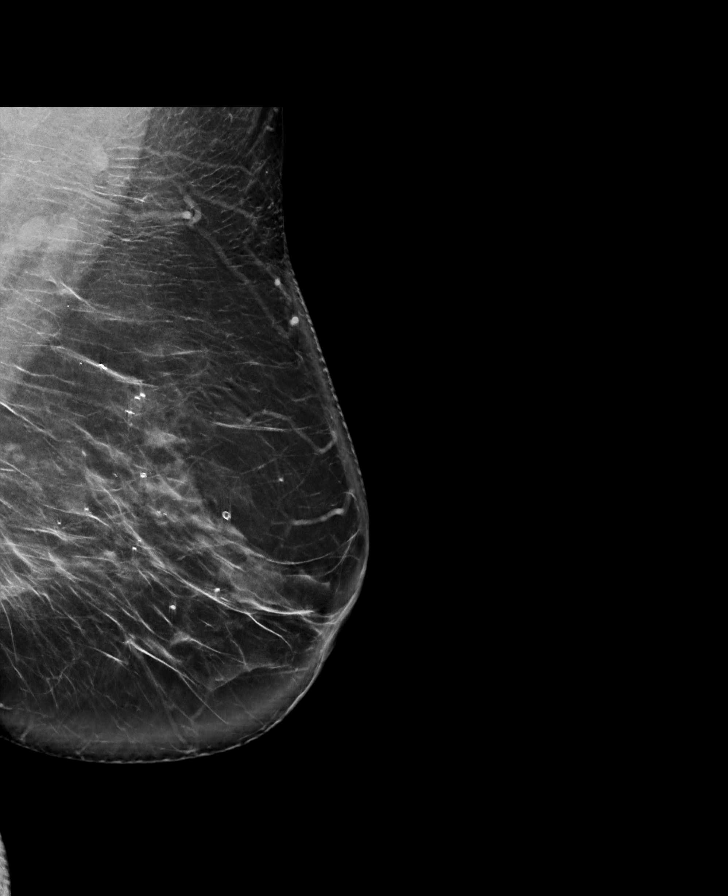

[R MLO synth-2D]
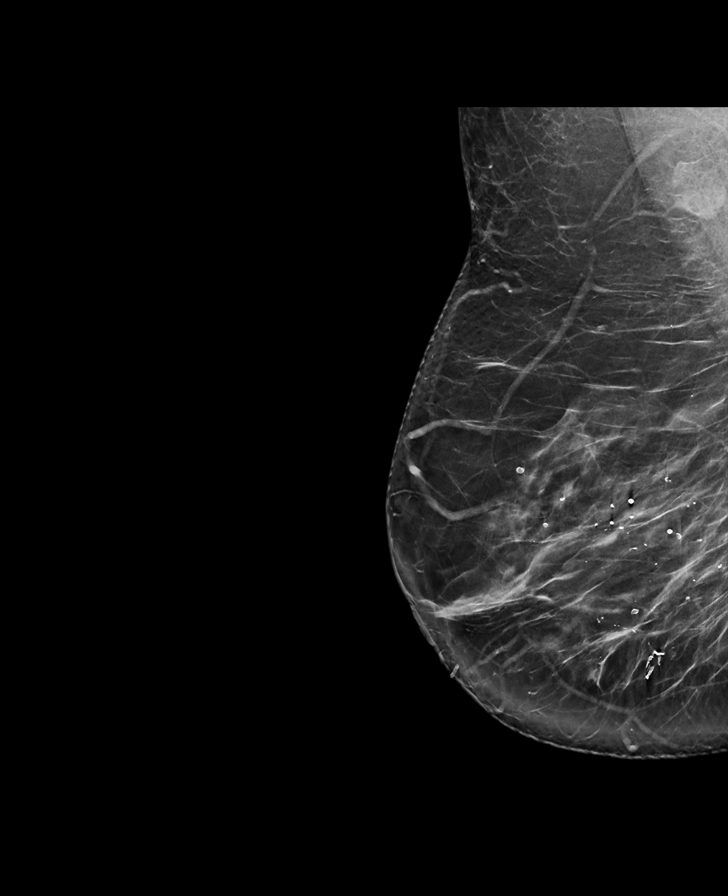

[L CC tomo · tomo slice 39/77.0]
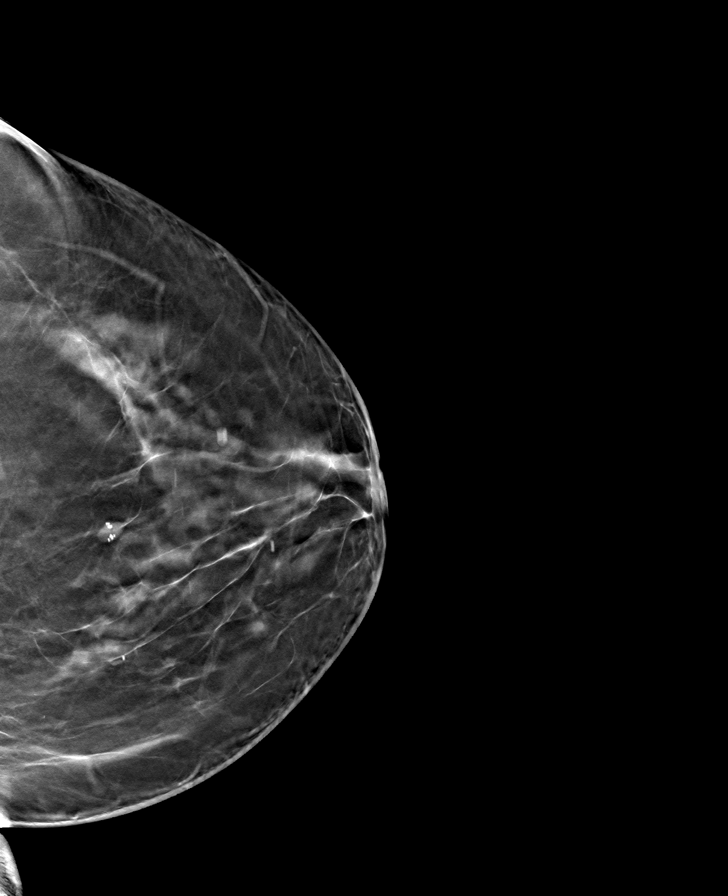

[L MLO tomo · tomo slice 47/93.0]
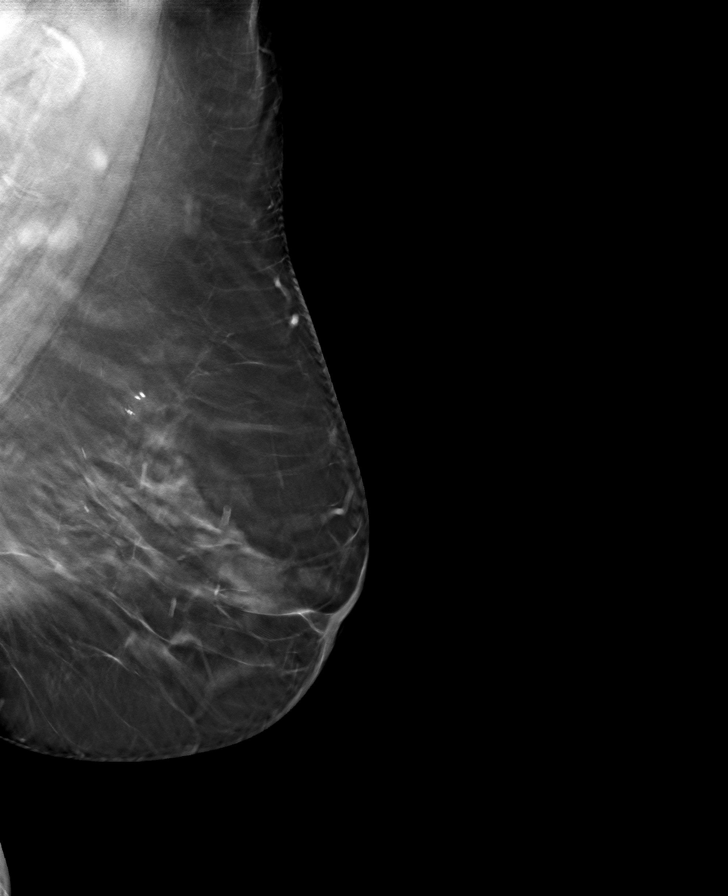

[R CC tomo · tomo slice 35/70.0]
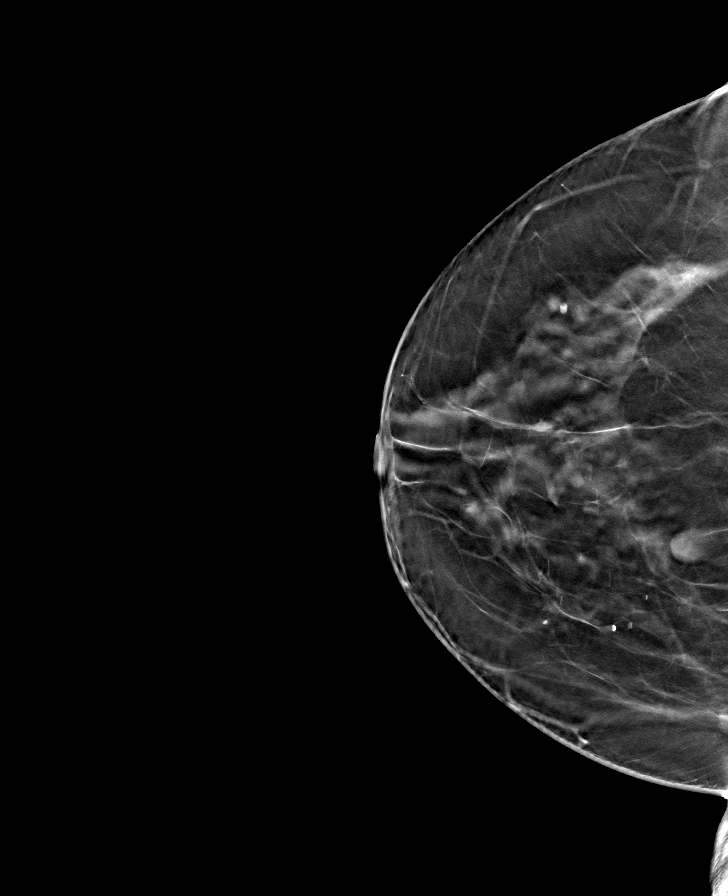

[R MLO tomo · tomo slice 43/86.0]
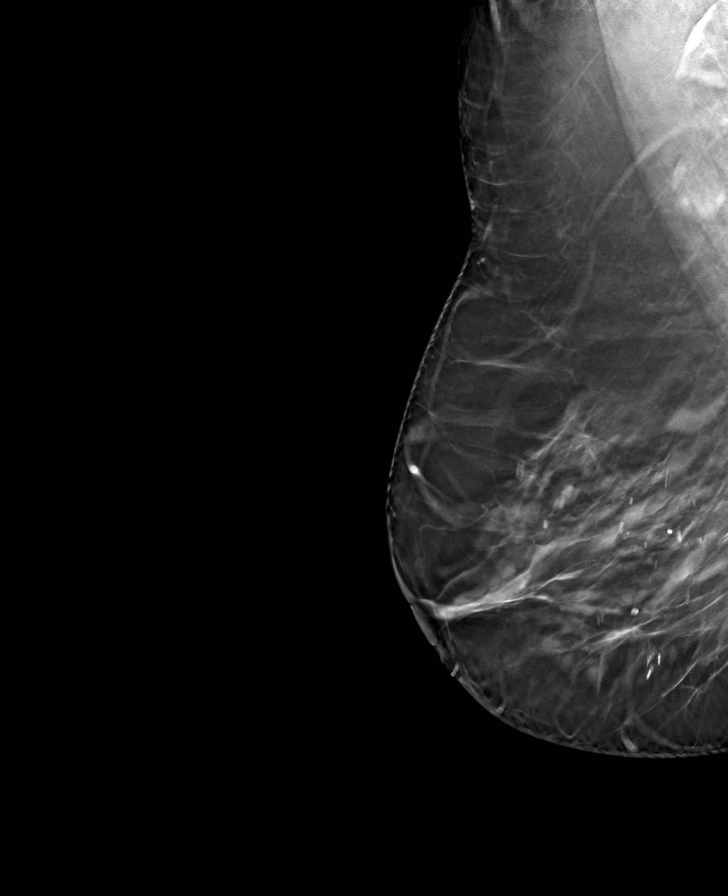

[8 of 24 positions shown; findings below may reference images not displayed]

ACR Breast Density Category b: There are scattered areas of
fibroglandular density.
FINDINGS: There are no findings suspicious for malignancy.
IMPRESSION: No mammographic evidence of malignancy. A result letter of this
screening mammogram will be mailed directly to the patient.

RECOMMENDATION:
Screening mammogram in one year. (Code:51-O-LD2)

BI-RADS CATEGORY  1: Negative.

## 2022-07-11 ENCOUNTER — Other Ambulatory Visit (HOSPITAL_BASED_OUTPATIENT_CLINIC_OR_DEPARTMENT_OTHER): Payer: Self-pay

## 2022-08-15 ENCOUNTER — Telehealth: Payer: Self-pay | Admitting: Cardiovascular Disease

## 2022-08-15 ENCOUNTER — Ambulatory Visit: Payer: Medicare HMO | Attending: Physician Assistant | Admitting: Nurse Practitioner

## 2022-08-15 ENCOUNTER — Telehealth: Payer: Self-pay | Admitting: *Deleted

## 2022-08-15 DIAGNOSIS — Z01810 Encounter for preprocedural cardiovascular examination: Secondary | ICD-10-CM

## 2022-08-15 NOTE — Telephone Encounter (Signed)
   Patient Name: Diana [name] DOB: [birth year] MRN: [id number]  Primary Cardiologist: Dr. Troy Sine, MD  Chart reviewed as part of preoperative protocol coverage.  Because of Diana Brash. [name]'s past medical history and time since last visit, she will require a follow-up telephone visit in order to better assess preoperative cardiovascular risk.  Pre-op covering staff: - Please schedule appointment and call patient to inform them.  If patient already had an upcoming appointment within acceptable timeframe, please add "pre-op clearance" to the appointment note so provider is aware. - Please contact requesting surgeon's office via preferred method (i.e., phone, fax) to inform them of need for appointment prior to surgery.  This message will also be routed to pharmacy pool and/or Dr. Claiborne Billings for input on holding Plavix and Aspirin as requested below so that this information is available to the clearing provider at time of patient's appointment.  Finis Bud, NP 08/15/2022 11:09 AM

## 2022-08-15 NOTE — Telephone Encounter (Signed)
Pt agreeable to plan of care for tele pre op appt 08/15/22 @ 3:20 add on today due to procedure date. Pt states she has been holding her Plavix since 08/14/22 per the requesting office. Med rec and consent are done. Pt thanked me for the help today.     Patient Consent for Virtual Visit        Diana [name] has provided verbal consent on 08/15/2022 for a virtual visit (video or telephone).   CONSENT FOR VIRTUAL VISIT FOR:  Diana [name]  By participating in this virtual visit I agree to the following:  I hereby voluntarily request, consent and authorize [facility] and its employed or contracted physicians, physician assistants, nurse practitioners or other licensed health care professionals (the Practitioner), to provide me with telemedicine health care services (the "Services") as deemed necessary by the treating Practitioner. I acknowledge and consent to receive the Services by the Practitioner via telemedicine. I understand that the telemedicine visit will involve communicating with the Practitioner through live audiovisual communication technology and the disclosure of certain medical information by electronic transmission. I acknowledge that I have been given the opportunity to request an in-person assessment or other available alternative prior to the telemedicine visit and am voluntarily participating in the telemedicine visit.  I understand that I have the right to withhold or withdraw my consent to the use of telemedicine in the course of my care at any time, without affecting my right to future care or treatment, and that the Practitioner or I may terminate the telemedicine visit at any time. I understand that I have the right to inspect all information obtained and/or recorded in the course of the telemedicine visit and may receive copies of available information for a reasonable fee.  I understand that some of the potential risks of receiving the Services via telemedicine  include:  Delay or interruption in medical evaluation due to technological equipment failure or disruption; Information transmitted may not be sufficient (e.g. poor resolution of images) to allow for appropriate medical decision making by the Practitioner; and/or  In rare instances, security protocols could fail, causing a breach of personal health information.  Furthermore, I acknowledge that it is my responsibility to provide information about my medical history, conditions and care that is complete and accurate to the best of my ability. I acknowledge that Practitioner's advice, recommendations, and/or decision may be based on factors not within their control, such as incomplete or inaccurate data provided by me or distortions of diagnostic images or specimens that may result from electronic transmissions. I understand that the practice of medicine is not an exact science and that Practitioner makes no warranties or guarantees regarding treatment outcomes. I acknowledge that a copy of this consent can be made available to me via my patient portal ([facility]), or I can request a printed copy by calling the office of [facility].    I understand that my insurance will be billed for this visit.   I have read or had this consent read to me. I understand the contents of this consent, which adequately explains the benefits and risks of the Services being provided via telemedicine.  I have been provided ample opportunity to ask questions regarding this consent and the Services and have had my questions answered to my satisfaction. I give my informed consent for the services to be provided through the use of telemedicine in my medical care

## 2022-08-15 NOTE — Progress Notes (Signed)
Virtual Visit via Telephone Note   Because of Diana [name]'s co-morbid illnesses, she is at least at moderate risk for complications without adequate follow up.  This format is felt to be most appropriate for this patient at this time.  The patient did not have access to video technology/had technical difficulties with video requiring transitioning to audio format only (telephone).  All issues noted in this document were discussed and addressed.  No physical exam could be performed with this format.  Please refer to the patient's chart for her consent to telehealth for Diana [name].  Evaluation Performed:  Preoperative cardiovascular risk assessment _____________   Date:  08/15/2022   Patient ID:  Diana, [name] 12/15/1945, MRN [id number] Patient Location:  Home Provider location:   Office  Primary Care Provider:  No primary care provider on file. Primary Cardiologist:  Shelva Majestic, MD  Chief Complaint / Patient Profile   76 y.o. y/o female with a h/o CAD, s/p LAD DES and RCA DES in 2011, HTN, COPD, T2DM, Obesity, Dyslipidemia, and grade 1 DD who is pending colonoscopy and presents today for telephonic preoperative cardiovascular risk assessment.  Past Medical History    Past Medical History:  Diagnosis Date   CAD (coronary artery disease) 7/12-8/12   staged LAD/RCA DES   Diabetes mellitus (Diana [name])    Diastolic dysfunction 8/30   grade 1    Dyslipidemia    HTN (hypertension)    Past Surgical History:  Procedure Laterality Date   PERCUTANEOUS CORONARY STENT INTERVENTION (PCI-S)  05/14/10   LAD   PERCUTANEOUS CORONARY STENT INTERVENTION (PCI-S)  05/17/10   RCA    Allergies  Allergies  Allergen Reactions   Lisinopril     Cough   Other Hives   Sulfa Antibiotics Hives    History of Present Illness    Diana [name] is a 76 y.o. female who presents via audio/video conferencing for a telehealth visit today.  Pt was last seen in cardiology clinic on  05/13/2022 by Dr. Claiborne Billings  At that time Diana [name] was doing well.  The patient is now pending procedure as outlined above.  Colonoscopy will be performed by Dr. Ronnette Juniper of [name].  Our office has been contacted for request to hold Plavix 5 days prior.  Type of anesthesia will be MAC.  Date of surgery is scheduled for August 18, 2022.  Since her last visit, she has done very well.  Denies any recent chest pain, shortness of breath, palpitations, syncope, presyncope, dizziness, swelling, significant weight changes, bleeding, orthopnea, PND, or claudication.  She stopped taking Plavix as of yesterday, therefore she will have held Plavix 5 days prior to her upcoming colonoscopy.  She verifies to me she is only taking 1 tablet of 81 mg of aspirin daily recently.  She was previously taking 3 tablets of 81 mg of aspirin, totaling 243 mg of Aspirin daily.  Works part-time at [employer].   Home Medications    Prior to Admission medications   Medication Sig Start Date End Date Taking? Authorizing Provider  aspirin 81 MG tablet Take 81 mg by mouth daily.    [provider]  B Complex-C (SUPER B COMPLEX) TABS Take 1 tablet by mouth daily. Reported on 03/24/2016    [provider]  BD INSULIN SYRINGE U/F 1/2UNIT 31G X 5/16" 0.3 ML MISC  11/05/19   [provider]  benzonatate (TESSALON PERLES) 100 MG capsule Take 1 capsule (100 mg total)  by mouth 3 (three) times daily as needed for cough. 11/13/16   Mayer Camel, MD  Beta Carotene (VITAMIN A) 25000 UNIT capsule Take 25,000 Units by mouth daily.    [provider]  Biotin 5000 MCG CAPS Take 1 capsule by mouth daily.    [provider]  Calcium-Magnesium-Vitamin D (CALCIUM 500 PO) Take 1 tablet by mouth daily.    [provider]  carvedilol (COREG) 25 MG tablet SMARTSIG:1 Tablet(s) By Mouth Every 12 Hours 04/23/22   [provider]  Cholecalciferol (VITAMIN D-3) 1000 UNITS CAPS Take 1  capsule by mouth daily.    [provider]  clopidogrel (PLAVIX) 75 MG tablet TAKE 1 TABLET BY MOUTH EVERY DAY 05/02/22   Troy Sine, MD  ezetimibe (ZETIA) 10 MG tablet TAKE 1 TABLET BY MOUTH EVERY DAY 12/13/21   Troy Sine, MD  fish oil-omega-3 fatty acids 1000 MG capsule Take 1 g by mouth daily.    [provider]  gabapentin (NEURONTIN) 100 MG capsule Take 100 mg by mouth daily. 11/16/21   [provider]  glimepiride (AMARYL) 1 MG tablet Take 1 tablet by mouth 2 (two) times daily. 12/12/16   [provider]  hydrochlorothiazide (HYDRODIURIL) 25 MG tablet  09/18/19   [provider]  insulin NPH-regular Human (NOVOLIN 70/30) (70-30) 100 UNIT/ML injection Patient inject 60 units twice at day. 10/09/19   [provider]  loratadine (CLARITIN) 10 MG tablet Take by mouth. 02/17/22   [provider]  losartan-hydrochlorothiazide (HYZAAR) 50-12.5 MG tablet TAKE 1 TABLET BY MOUTH EVERY DAY. NEED APPT FOR REFILLS 05/02/22   Troy Sine, MD  metFORMIN (GLUCOPHAGE) 1000 MG tablet Take 500 mg by mouth 2 (two) times daily with a meal.  04/29/13   [provider]  naproxen (NAPROSYN) 500 MG tablet Take 1 tablet (500 mg total) by mouth 2 (two) times daily with a meal. 11/13/16   Mayer Camel, MD  nitroGLYCERIN (NITROSTAT) 0.4 MG SL tablet Place 1 tablet (0.4 mg total) under the tongue every 5 (five) minutes as needed for chest pain. 05/11/21 05/13/22  Troy Sine, MD  ondansetron (ZOFRAN ODT) 4 MG disintegrating tablet Take 1 tablet (4 mg total) by mouth every 8 (eight) hours as needed for nausea or vomiting. 11/13/16   Mayer Camel, MD  pantoprazole (PROTONIX) 40 MG tablet Take 1 tablet by mouth daily. Reported on 03/24/2016 05/06/13   [provider]  rosuvastatin (CRESTOR) 10 MG tablet TAKE 1 TABLET BY MOUTH ONE TIME PER WEEK 09/29/21   Troy Sine, MD  Semaglutide,0.25 or 0.5MG/DOS, (OZEMPIC, 0.25 OR 0.5 MG/DOSE,) 2  MG/3ML SOPN Inject 0.5 mg into the skin once weekly. 06/13/22       Physical Exam    Vital Signs:  Diana [name] does not have vital signs available for review today. Given telephonic nature of communication, physical exam is limited. AAOx3. NAD. Normal affect.  Speech and respirations are unlabored.  Accessory Clinical Findings    None  Assessment & Plan    1.  Preoperative Cardiovascular Risk Assessment:     Diana [name] perioperative risk of a major cardiac event is 0.4% according to the Revised Cardiac Risk Index (RCRI).  Therefore, she is at low risk for perioperative complications.   Her functional capacity is good at 7.34 METs according to the Duke Activity Status Index (DASI). Recommendations: According to ACC/AHA guidelines, no further cardiovascular testing needed.  The patient may proceed to surgery at  acceptable risk.   Antiplatelet and/or Anticoagulation Recommendations: Aspirin 81 mg daily should be continued without interruption, since colonoscopy is a low risk bleeding procedure.  Clopidogrel (Plavix) can be held for 5 days prior to her surgery and resumed as soon as possible post op.  Patient is not on any other anticoagulation therapy.  She does not require SBE prophylaxis prior to surgery.  The patient was advised that if she develops new symptoms prior to surgery to contact our office to arrange for a follow-up visit, and she verbalized understanding.   A copy of this note will be routed to requesting surgeon.  Time:   Today, I have spent 12 minutes with the patient with telehealth technology discussing medical history, symptoms, and management plan.     Finis Bud, NP  08/15/2022, 4:00 PM

## 2022-08-15 NOTE — Telephone Encounter (Signed)
   Pre-operative Risk Assessment    Patient Name: Diana [name]  DOB: 1945-12-01 MRN: [id number]      Request for Surgical Clearance    Procedure:   Colonoscopy  Date of Surgery:  Clearance 08/18/22                                 Surgeon:  Dr. Ronnette Juniper  Surgeon's Group or Practice Name:  [facility] Phone number:  [phone] Fax number:  [phone]   Type of Clearance Requested:   - Medical  - Pharmacy:  Hold Clopidogrel (Plavix) 5 days prior   Type of Anesthesia:  MAC   Additional requests/questions:    Crist Infante   08/15/2022, 8:46 AM

## 2022-08-15 NOTE — Telephone Encounter (Signed)
Pt agreeable to plan of care for tele pre op appt 08/15/22 @ 3:20 add on today due to procedure date. Pt states she has been holding her Plavix since 08/14/22 per the requesting office. Med rec and consent are done. Pt thanked me for the help today.

## 2022-08-25 ENCOUNTER — Other Ambulatory Visit: Payer: Self-pay | Admitting: Cardiovascular Disease

## 2022-12-06 ENCOUNTER — Other Ambulatory Visit (HOSPITAL_BASED_OUTPATIENT_CLINIC_OR_DEPARTMENT_OTHER): Payer: Self-pay

## 2022-12-09 ENCOUNTER — Other Ambulatory Visit: Payer: Self-pay | Admitting: Nurse Practitioner

## 2022-12-09 ENCOUNTER — Other Ambulatory Visit (HOSPITAL_BASED_OUTPATIENT_CLINIC_OR_DEPARTMENT_OTHER): Payer: Self-pay

## 2022-12-09 DIAGNOSIS — Z1231 Encounter for screening mammogram for malignant neoplasm of breast: Secondary | ICD-10-CM

## 2022-12-18 ENCOUNTER — Other Ambulatory Visit: Payer: Self-pay | Admitting: Cardiovascular Disease

## 2023-01-04 ENCOUNTER — Other Ambulatory Visit (HOSPITAL_BASED_OUTPATIENT_CLINIC_OR_DEPARTMENT_OTHER): Payer: Self-pay

## 2023-01-06 ENCOUNTER — Other Ambulatory Visit (HOSPITAL_BASED_OUTPATIENT_CLINIC_OR_DEPARTMENT_OTHER): Payer: Self-pay

## 2023-01-06 MED ORDER — SEMAGLUTIDE(0.25 OR 0.5MG/DOS) 2 MG/3ML ~~LOC~~ SOPN
PEN_INJECTOR | SUBCUTANEOUS | 2 refills | Status: DC
  Filled 2023-01-06: qty 3, 28d supply, fill #0
  Filled 2023-02-09: qty 3, 28d supply, fill #1
  Filled 2023-03-22: qty 3, 28d supply, fill #2

## 2023-01-10 ENCOUNTER — Other Ambulatory Visit (HOSPITAL_BASED_OUTPATIENT_CLINIC_OR_DEPARTMENT_OTHER): Payer: Self-pay

## 2023-01-20 ENCOUNTER — Ambulatory Visit
Admission: RE | Admit: 2023-01-20 | Discharge: 2023-01-20 | Disposition: A | Discharge status: Home / Self Care | Payer: Medicare HMO | Source: Ambulatory Visit | Attending: Nurse Practitioner | Admitting: Nurse Practitioner

## 2023-01-20 DIAGNOSIS — Z1231 Encounter for screening mammogram for malignant neoplasm of breast: Secondary | ICD-10-CM

## 2023-02-09 ENCOUNTER — Other Ambulatory Visit (HOSPITAL_BASED_OUTPATIENT_CLINIC_OR_DEPARTMENT_OTHER): Payer: Self-pay

## 2023-02-17 ENCOUNTER — Other Ambulatory Visit (HOSPITAL_BASED_OUTPATIENT_CLINIC_OR_DEPARTMENT_OTHER): Payer: Self-pay

## 2023-03-22 ENCOUNTER — Other Ambulatory Visit (HOSPITAL_BASED_OUTPATIENT_CLINIC_OR_DEPARTMENT_OTHER): Payer: Self-pay

## 2023-04-19 ENCOUNTER — Other Ambulatory Visit (HOSPITAL_BASED_OUTPATIENT_CLINIC_OR_DEPARTMENT_OTHER): Payer: Self-pay

## 2023-04-19 MED ORDER — SEMAGLUTIDE(0.25 OR 0.5MG/DOS) 2 MG/3ML ~~LOC~~ SOPN
0.5000 mg | PEN_INJECTOR | SUBCUTANEOUS | 2 refills | Status: DC
  Filled 2023-04-19: qty 3, 28d supply, fill #0
  Filled 2023-06-08: qty 3, 28d supply, fill #1

## 2023-04-26 ENCOUNTER — Other Ambulatory Visit: Payer: Self-pay

## 2023-05-02 ENCOUNTER — Other Ambulatory Visit: Payer: Self-pay | Admitting: Cardiovascular Disease

## 2023-05-09 ENCOUNTER — Other Ambulatory Visit: Payer: Self-pay | Admitting: Cardiovascular Disease

## 2023-05-17 ENCOUNTER — Other Ambulatory Visit: Payer: Self-pay | Admitting: Cardiovascular Disease

## 2023-06-04 ENCOUNTER — Other Ambulatory Visit: Payer: Self-pay | Admitting: Cardiovascular Disease

## 2023-06-08 ENCOUNTER — Other Ambulatory Visit (HOSPITAL_BASED_OUTPATIENT_CLINIC_OR_DEPARTMENT_OTHER): Payer: Self-pay

## 2023-07-26 ENCOUNTER — Ambulatory Visit: Payer: Medicare HMO | Attending: Cardiovascular Disease | Admitting: Cardiovascular Disease

## 2023-07-26 ENCOUNTER — Encounter: Payer: Self-pay | Admitting: Cardiovascular Disease

## 2023-07-26 DIAGNOSIS — I10 Essential (primary) hypertension: Secondary | ICD-10-CM | POA: Diagnosis not present

## 2023-07-26 DIAGNOSIS — Z9861 Coronary angioplasty status: Secondary | ICD-10-CM

## 2023-07-26 DIAGNOSIS — I25118 Atherosclerotic heart disease of native coronary artery with other forms of angina pectoris: Secondary | ICD-10-CM | POA: Diagnosis not present

## 2023-07-26 DIAGNOSIS — M25475 Effusion, left foot: Secondary | ICD-10-CM

## 2023-07-26 DIAGNOSIS — E785 Hyperlipidemia, unspecified: Secondary | ICD-10-CM | POA: Diagnosis not present

## 2023-07-26 DIAGNOSIS — I2510 Atherosclerotic heart disease of native coronary artery without angina pectoris: Secondary | ICD-10-CM

## 2023-07-26 DIAGNOSIS — I2089 Other forms of angina pectoris: Secondary | ICD-10-CM

## 2023-07-26 DIAGNOSIS — Z794 Long term (current) use of insulin: Secondary | ICD-10-CM

## 2023-07-26 DIAGNOSIS — E669 Obesity, unspecified: Secondary | ICD-10-CM

## 2023-07-26 DIAGNOSIS — M25471 Effusion, right ankle: Secondary | ICD-10-CM

## 2023-07-26 DIAGNOSIS — M25472 Effusion, left ankle: Secondary | ICD-10-CM

## 2023-07-26 DIAGNOSIS — E119 Type 2 diabetes mellitus without complications: Secondary | ICD-10-CM

## 2023-07-26 DIAGNOSIS — M25474 Effusion, right foot: Secondary | ICD-10-CM

## 2023-07-26 MED ORDER — NITROGLYCERIN 0.4 MG SL SUBL: 0.4000 mg | SUBLINGUAL_TABLET | SUBLINGUAL | 3 refills | Status: DC | PRN

## 2023-07-26 MED ORDER — ISOSORBIDE MONONITRATE ER 30 MG PO TB24: 30.0000 mg | ORAL_TABLET | Freq: Every day | ORAL | 3 refills | Status: DC

## 2023-07-26 NOTE — Patient Instructions (Signed)
Medication Instructions:  Begin Isosorbide Mononitrate 30mg . Take this tablet daily.  Take Nitroglycerin 0.4mg  as needed for chest pain  *If you need a refill on your cardiac medications before your next appointment, please call your pharmacy*   Lab Work: CMET, CBC, TSH, LIPID, LPa If you have labs (blood work) drawn today and your tests are completely normal, you will receive your results only by: MyChart Message (if you have MyChart) OR A paper copy in the mail If you have any lab test that is abnormal or we need to change your treatment, we will call you to review the results.   Testing/Procedures:   How to Prepare for Your Cardiac PET/CT Stress Test:  1. Please do not take these medications before your test:   Medications that may interfere with the cardiac pharmacological stress agent (ex. nitrates - including erectile dysfunction medications, isosorbide mononitrate, tamulosin or beta-blockers) the day of the exam. (Erectile dysfunction medication should be held for at least 72 hrs prior to test) Theophylline containing medications for 12 hours. Dipyridamole 48 hours prior to the test. Your remaining medications may be taken with water.  2. Nothing to eat or drink, except water, 3 hours prior to arrival time.   NO caffeine/decaffeinated products, or chocolate 12 hours prior to arrival.  3. NO perfume, cologne or lotion on chest or abdomen area.          - FEMALES - Please avoid wearing dresses to this appointment.  4. Total time is 1 to 2 hours; you may want to bring reading material for the waiting time.  5. Please report to Radiology at the [facility] Main Entrance 30 minutes early for your test.  [address] [place], [place] [address]  6. Please report to Radiology at [facility] Main Entrance, medical mall, 30 mins prior to your test.  [address]  [place], [place]  [phone]  Diabetic Preparation:  Hold oral  medications. You may take NPH and Lantus insulin. Do not take Humalog or Humulin R (Regular Insulin) the day of your test. Check blood sugars prior to leaving the house. If able to eat breakfast prior to 3 hour fasting, you may take all medications, including your insulin, Do not worry if you miss your breakfast dose of insulin - start at your next meal. Patients who wear a continuous glucose monitor MUST remove the device prior to scanning.   In preparation for your appointment, medication and supplies will be purchased.  Appointment availability is limited, so if you need to cancel or reschedule, please call the Radiology Department at [phone] [facility]) OR [phone] [facility])  24 hours in advance to avoid a cancellation fee of $100.00  What to Expect After you Arrive:  Once you arrive and check in for your appointment, you will be taken to a preparation room within the Radiology Department.  A technologist or Nurse will obtain your medical history, verify that you are correctly prepped for the exam, and explain the procedure.  Afterwards,  an IV will be started in your arm and electrodes will be placed on your skin for EKG monitoring during the stress portion of the exam. Then you will be escorted to the PET/CT scanner.  There, staff will get you positioned on the scanner and obtain a blood pressure and EKG.  During the exam, you will continue to be connected to the EKG and blood pressure machines.  A small, safe amount of a radioactive tracer  will be injected in your IV to obtain a series of pictures of your heart along with an injection of a stress agent.    After your Exam:  It is recommended that you eat a meal and drink a caffeinated beverage to counter act any effects of the stress agent.  Drink plenty of fluids for the remainder of the day and urinate frequently for the first couple of hours after the exam.  Your doctor will inform you of your test results within 7-10 business  days.  For more information and frequently asked questions, please visit our website : [email or web address]  For questions about your test or how to prepare for your test, please call: Cardiac Imaging Nurse Navigators Office: [phone]    Follow-Up: At [facility], you and your health needs are our priority.  As part of our continuing mission to provide you with exceptional heart care, we have created designated Provider Care Teams.  These Care Teams include your primary Cardiologist (physician) and Advanced Practice Providers (APPs -  Physician Assistants and Nurse Practitioners) who all work together to provide you with the care you need, when you need it.  We recommend signing up for the patient portal called "MyChart".  Sign up information is provided on this After Visit Summary.  MyChart is used to connect with patients for Virtual Visits (Telemedicine).  Patients are able to view lab/test results, encounter notes, upcoming appointments, etc.  Non-urgent messages can be sent to your provider as well.   To learn more about what you can do with MyChart, go to [email or web address].    Your next appointment:   4-6 week(s)  Provider:   Nicki Guadalajara, MD

## 2023-07-26 NOTE — Progress Notes (Signed)
patient ID: Diana [name], female   DOB: 1945/11/27, 77 y.o.   MRN: [id number]        HPI: Diana [name] is a 77 y.o. female presents to the office today for a 15 month cardiology follow up evaluation.  Diana [name] has CAD and in July/August 2011 she underwent staged intervention with PTCA and stenting of a 95% ostial LAD stenosis (3.5x12 mm promiscuity a stent) postdilated 3.75 mm, and RCA  with tandem 3.0x28 and 2.5x23 mm stents. She has a history of hyperlipidemia. In January 2012 while on Crestor she developed mild transaminase elevation leading to its discontinuance.  She has a history of diabetes mellitus which has been poorly controlled, GERD,  remote history of pneumonia. An echo Doppler study in March 2014 showed normal systolic function with grade 1 diastolic dysfunction. She did have mild aortic sclerosis with mild MR and mild LA dilation. I scheduled her for a two-year followup nuclear perfusion study but insurance I denied this.  Laboratory in April 2014 showed a glucose of 169. At that time, her cholesterol was 166 LDL 81 triglycerides were 401 and hemoglobin A1c was 9.0. Her primary physician is increased her insulin. An NMR evaluation showed an LDL cholesterol of 70 but due to her increased small LDL particles and 1086, LDL particle number was still significantly increased at 1383. HDL was 44 triglycerides are still elevated at 180. Recent hemoglobin A1c was 87. ,  She was started on Zetia, which she had tolerated.  Diana [name] underwent a nuclear perfusion study on 06/04/2013 which continued to show normal perfusion and function. Ejection fraction was 70%. There was no evidence for scar or ischemia.    When I saw her in March 2018  she was without chest pain or shortness of breath.  She was no longer taking insulin for her diabetes mellitus.    I saw her in February 2019.  At that time she remained stable and was without chest pain.   She has been on Zetia 10 mg at fish oil for  hyperlipidemia.  A  lipid panel 1 week prior to that evaluation had shown a total cholesterol 164, HDL 46, triglycerides 140, and LDL 94.  She has continued to take aspirin and Plavix.  She is on losartan HCT 50/12.5 and carvedilol 12.5 mm twice a day for blood pressure and her CAD.  She was on metformin 500 mg twice a day, glimepiride 1 mg twice a day, and Actos 15 mg for diabetes mellitus.  She has a peripheral neuropathy on gabapentin.  GERD is controlled with Protonix.  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  She obtains fiber, and is involved in breeding, boarding, and rescues other animals.    I saw her in February 2021 after not having seen her in 2 years.  At that time she was continuing to do well and was without chest pain or shortness of breath.  She was now seeing Dr. Corwin [name] for endocrinology and continues to be on metformin, glimepiride and was recently started on Ozempic.  She was no longer on pioglitazone.  She has continued to be on Zetia and rosuvastatin 10 mg for hyperlipidemia and  DAPT with aspirin/Plavix.  She noticed rare episodes of lightheadedness but denies any orthostatic symptoms.  She was unaware of palpitations.  I last saw her on May 11, 2021.  She has continued to see Dr. Corwin [name] at [facility] for  her endocrinology care and recently her hemoglobin A1c had risen to 12.  She continues to be on aspirin and Plavix for DAPT.  She is on Zetia and rosuvastatin for hyperlipidemia.  She has not had any chest pain and continues to be on carvedilol 12.5 mg, losartan HCT 50/12.5 mg for hypertension.  She is on metformin in addition to glimepiride and Ozempic now for her diabetes mellitus.  BMI was 35.3 and weight loss and increased exercise was recommended.  She was evaluated in the emergency room on September 05, 2021 with neck pain felt to be cervical and treated with Solu-Medrol.  I last saws her on May 14, 2023. Since I last saw  her, she continues to be followed at [facility] for primary care and sees Diana Richer, NP.  She had experienced very mild chest discomfort when she was walking very fast which resolved with increasing her carvedilol dose to now 25 mg twice a day.  She admits to 12 pound purposeful weight loss.  She is on DAPT with aspirin/Plavix.  She continues to be on increased carvedilol to 25 mg twice a day,  losartan HCT 50/12.5 mg for hypertension.  She is on insulin and metformin in addition to Ozempic for diabetes.  She is on rosuvastatin and Zetia with omega-3 fatty acid for mixed hyperlipidemia.    Since I last saw her, she continues to work at [employer] [place] in [place] with [employer].  Over the last year, she has noticed episodes of chest tightness.  Of increasing walking.  Most of the symptoms abate spontaneously with rest.  However, she has taken sublingual nitroglycerin particularly if they were associated with diaphoresis with loosening of chest pain symptomatology.  Currently, she is on aspirin/clopidogrel for DAPT.  She takes carvedilol 25 mg twice a day but she did not take her dose this morning yet, [place] HCT 50/12.5 mg and also takes separate hydrochlorothiazide 25 mg.  She does have some ankle swelling.  He is on Zetia 10 mg and rosuvastatin 10 mg 1 time per week.  Laboratory in July 2023 showed total cholesterol 127 with LDL cholesterol 57 HDL 38 and triglycerides 196.  Is diabetic on metformin and Novolin insulin as well as Ozempic.  She no longer takes glimepiride.  She is on pantoprazole for GERD.  Her prior urinary care provider Dr. Aida [name] retired.  She now sees Diana Bosch, NP in [facility].  She presents for evaluation.   Past Medical History:  Diagnosis Date   CAD (coronary artery disease) 7/12-8/12   staged LAD/RCA DES   Diabetes mellitus (HCC)    Diastolic dysfunction 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    Dyslipidemia    HTN (hypertension)     Past  Surgical History:  Procedure Laterality Date   PERCUTANEOUS CORONARY STENT INTERVENTION (PCI-S)  05/14/10   LAD   PERCUTANEOUS CORONARY STENT INTERVENTION (PCI-S)  05/17/10   RCA    Allergies  Allergen Reactions   Lisinopril     Cough   Other Hives   Sulfa Antibiotics Hives    Current Outpatient Medications  Medication Sig Dispense Refill   aspirin 81 MG tablet Take 81 mg by mouth daily.     B Complex-C (SUPER B COMPLEX) TABS Take 1 tablet by mouth daily. Reported on 03/24/2016     BD INSULIN SYRINGE U/F 1/2UNIT 31G X 5/16" 0.3 ML MISC      benzonatate (TESSALON PERLES) 100 MG capsule Take 1 capsule (100 mg  total) by mouth 3 (three) times daily as needed for cough. 20 capsule 0   Beta Carotene (VITAMIN A) 25000 UNIT capsule Take 25,000 Units by mouth daily.     Biotin 5000 MCG CAPS Take 1 capsule by mouth daily.     Calcium-Magnesium-Vitamin D (CALCIUM 500 PO) Take 1 tablet by mouth daily.     carvedilol (COREG) 25 MG tablet SMARTSIG:1 Tablet(s) By Mouth Every 12 Hours     Cholecalciferol (VITAMIN D-3) 1000 UNITS CAPS Take 1 capsule by mouth daily.     clopidogrel (PLAVIX) 75 MG tablet TAKE 1 TABLET BY MOUTH EVERY DAY 90 tablet 3   ezetimibe (ZETIA) 10 MG tablet TAKE 1 TABLET BY MOUTH EVERY DAY 90 tablet 2   fish oil-omega-3 fatty acids 1000 MG capsule Take 1 g by mouth daily.     gabapentin (NEURONTIN) 100 MG capsule Take 100 mg by mouth daily.     hydrochlorothiazide (HYDRODIURIL) 25 MG tablet      insulin NPH-regular Human (NOVOLIN 70/30) (70-30) 100 UNIT/ML injection Patient inject 60 units twice at day.     isosorbide mononitrate (IMDUR) 30 MG 24 hr tablet Take 1 tablet (30 mg total) by mouth daily. 90 tablet 3   loratadine (CLARITIN) 10 MG tablet Take by mouth.     losartan-hydrochlorothiazide (HYZAAR) 50-12.5 MG tablet TAKE 1 TABLET BY MOUTH EVERY DAY. NEED APPT FOR REFILLS 90 tablet 3   metFORMIN (GLUCOPHAGE) 1000 MG tablet Take 500 mg by mouth 2 (two) times daily with a meal.       naproxen (NAPROSYN) 500 MG tablet Take 1 tablet (500 mg total) by mouth 2 (two) times daily with a meal. 14 tablet 0   ondansetron (ZOFRAN ODT) 4 MG disintegrating tablet Take 1 tablet (4 mg total) by mouth every 8 (eight) hours as needed for nausea or vomiting. 10 tablet 0   pantoprazole (PROTONIX) 40 MG tablet Take 1 tablet by mouth daily. Reported on 03/24/2016     rosuvastatin (CRESTOR) 10 MG tablet TAKE 1 TABLET BY MOUTH ONE TIME PER WEEK 12 tablet 3   Semaglutide,0.25 or 0.5MG /DOS, (OZEMPIC, 0.25 OR 0.5 MG/DOSE,) 2 MG/3ML SOPN Inject 0.5 mg into the skin once a week. 3 mL 2   nitroGLYCERIN (NITROSTAT) 0.4 MG SL tablet Place 1 tablet (0.4 mg total) under the tongue every 5 (five) minutes as needed for chest pain. 25 tablet 3   No current facility-administered medications for this visit.    Social History   Socioeconomic History   Marital status: Married    Spouse name: Not on file   Number of children: Not on file   Years of education: Not on file   Highest education level: Not on file  Occupational History   Not on file  Tobacco Use   Smoking status: Never   Smokeless tobacco: Never  Substance and Sexual Activity   Alcohol use: Yes    Comment: Occasionally-very rare   Drug use: Not on file   Sexual activity: Not on file  Other Topics Concern   Not on file  Social History Narrative   Not on file   Social Determinants of Health   Financial Resource Strain: Not on file  Food Insecurity: Not on file  Transportation Needs: Not on file  Physical Activity: Not on file  Stress: Not on file  Social Connections: Not on file  Intimate Partner Violence: Not on file    ROS General: Negative; No fevers, chills, or night sweats;  HEENT:  Negative; No changes in vision or hearing, sinus congestion, difficulty swallowing Pulmonary: Negative; No cough, wheezing, shortness of breath, hemoptysis Cardiovascular: See history of present illness GI: GERD GU: Negative; No dysuria,  hematuria, or difficulty voiding Musculoskeletal: Negative; no myalgias, joint pain, or weakness Hematologic/Oncology: Negative; no easy bruising, bleeding Endocrine: Positive for diabetes; no heat/cold intolerance; Neuro: Negative; no changes in balance, headaches Skin: Negative; No rashes or skin lesions Psychiatric: Negative; No behavioral problems, depression Sleep: Negative; No snoring, daytime sleepiness, hypersomnolence, bruxism, restless legs, hypnogognic hallucinations, no cataplexy Other comprehensive 14 point system review is negative.   PE BP 104/74   Pulse 88   Ht 5\' 4"  (1.626 m)   Wt 211 lb 6.4 oz (95.9 kg)   SpO2 98%   BMI 36.29 kg/m    Repeat blood pressure by me was 120/76  Wt Readings from Last 3 Encounters:  07/26/23 211 lb 6.4 oz (95.9 kg)  05/13/22 209 lb (94.8 kg)  05/11/21 205 lb 6.4 oz (93.2 kg)   General: Alert, oriented, no distress.  Skin: normal turgor, no rashes, warm and dry HEENT: Normocephalic, atraumatic. Pupils equal round and reactive to light; sclera anicteric; extraocular muscles intact;  Nose without nasal septal hypertrophy Mouth/Parynx benign; Mallinpatti scale 3 Neck: No JVD, no carotid bruits; normal carotid upstroke Lungs: clear to ausculatation and percussion; no wheezing or rales Chest wall: without tenderness to palpitation Heart: PMI not displaced, RRR, s1 s2 normal, 1/6 systolic murmur, no diastolic murmur, no rubs, gallops, thrills, or heaves Abdomen: Central adiposity; soft, nontender; no hepatosplenomehaly, BS+; abdominal aorta nontender and not dilated by palpation. Back: no CVA tenderness Pulses 2+ Musculoskeletal: full range of motion, normal strength, no joint deformities Extremities: Bilateral ankle edema no clubbing cyanosis, Homan's sign negative  Neurologic: grossly nonfocal; Cranial nerves grossly wnl Psychologic: Normal mood and affect    EKG Interpretation Date/Time:  Wednesday July 26 2023 08:44:05  EDT Ventricular Rate:  88 PR Interval:  174 QRS Duration:  112 QT Interval:  400 QTC Calculation: 484 R Axis:   30  Text Interpretation: Normal sinus rhythm Nonspecific ST and T wave abnormality Prolonged QT When compared with ECG of 13-Nov-2016 18:56, PREVIOUS ECG IS PRESENT Confirmed by Nicki Guadalajara ([id number]) on 07/26/2023 9:07:13 AM    May 13, 2022 ECG (independently read by me): NSR at 84, no ectopy, no STT changes, normal intervals  May 11, 2021 ECG (independently read by me): Normal sinus rhythm at 87 bpm.  No ectopy.  Normal intervals.  February 2021 ECG (independently read by me): Normal sinus rhythm at 82; no ectopy  February 2019 ECG (independently read by me): Normal sinus rhythm at 72 bpm.  Borderline left atrial enlargement.  No ST segment changes.  Normal intervals.QTc 464 msec  March 2018 ECG (independently read by me): Normal sinus rhythm at 72 bpm.  No ectopy.  Normal intervals.  November 2016 ECG (independently read by me): Normal sinus rhythm at 85 bpm.  Normal intervals.  No significant ST segment changes.  May 2015 ECG (independently read by me): Normal sinus rhythm at 74 beats per minute.  Mild T wave changes V1, V2, and V3.  LABS:     Latest Ref Rng & Units 05/13/2022    8:47 AM 05/18/2021    9:03 AM 10/29/2019    8:36 AM  BMP  Glucose 70 - 99 mg/dL 604  540  981   BUN 8 - 27 mg/dL 18  11  17    Creatinine 0.57 -  1.00 mg/dL 1.61  0.96  0.45   BUN/Creat Ratio 12 - 28 20  15  21    Sodium 134 - 144 mmol/L 137  141  137   Potassium 3.5 - 5.2 mmol/L 4.5  4.3  4.5   Chloride 96 - 106 mmol/L 97  101  96   CO2 20 - 29 mmol/L 25  24  24    Calcium 8.7 - 10.3 mg/dL 9.3  9.2  9.6       Latest Ref Rng & Units 05/13/2022    8:47 AM 05/18/2021    9:03 AM 10/29/2019    8:36 AM  Hepatic Function  Total Protein 6.0 - 8.5 g/dL 6.9  6.9  7.3   Albumin 3.8 - 4.8 g/dL 4.3  4.3  4.4   AST 0 - 40 IU/L 17  21  16    ALT 0 - 32 IU/L 28  31  26    Alk Phosphatase 44 - 121 IU/L 64   48  52   Total Bilirubin 0.0 - 1.2 mg/dL <4.0  <9.8  0.2       Latest Ref Rng & Units 05/13/2022    8:47 AM 05/18/2021    9:03 AM 12/21/2016    9:12 AM  CBC  WBC 3.4 - 10.8 x10E3/uL 6.8  5.6  4.9   Hemoglobin 11.1 - 15.9 g/dL 11.9  14.7  82.9   Hematocrit 34.0 - 46.6 % 36.4  39.7  38.6   Platelets 150 - 450 x10E3/uL 331  322  236    Lab Results  Component Value Date   MCV 85 05/13/2022   MCV 84 05/18/2021   MCV 87.5 12/21/2016   Lab Results  Component Value Date   TSH 1.940 05/13/2022    Lipid Panel     Component Value Date/Time   CHOL 127 05/13/2022 0847   CHOL 122 02/07/2014 0902   TRIG 196 (H) 05/13/2022 0847   TRIG 160 (H) 02/07/2014 0902   HDL 38 (L) 05/13/2022 0847   HDL 46 02/07/2014 0902   CHOLHDL 3.3 05/13/2022 0847   CHOLHDL 3.6 11/17/2017 0833   VLDL 26 12/21/2016 0912   LDLCALC 57 05/13/2022 0847   LDLCALC 94 11/17/2017 0833   LDLCALC 44 02/07/2014 0902    RADIOLOGY: No results found.   IMPRESSION:  1. CAD in native artery   2. History of percutaneous coronary intervention: 2011, LAD, RCA   3. Primary hypertension   4. Hyperlipidemia with target LDL less than 70   5. Exertional angina (HCC)   6. Hyperlipidemia with target low density lipoprotein (LDL) cholesterol less than 55 mg/dL   7. Bilateral swelling of feet and ankles   8. Moderate obesity   9. Type 2 diabetes mellitus without complication, with long-term current use of insulin (HCC)      ASSESSMENT AND PLAN: Diana [name] is a 77 year old female who underwent two-vessel intervention including a 95% ostial LAD stenosis with PTCA stenting and tandem stenting to her RCA in 2011. A nuclear perfusion study in 2014 remained low risk.   She has a history of moderate obesity, diabetes mellitus, and peripheral neuropathy.  I saw her in 2023 and at that time she did started to notice some very mild chest discomfort when she was walking very fast and her dose of carvedilol was increased from 12.5 twice  daily to 25 mg twice a day with improvement.  I have not seen her over the past 15 months.  Over  the past year, she has continued to experience episodic exertional chest tightness.  Usually she experiences symptoms once or twice a month.  However, if she had to walk prolonged and uphill she noticed more tightness associated with diaphoresis and symptoms abated quickly with sublingual nitroglycerin.  She has a new primary care provider.  Not had recent laboratory.  ECG today shows sinus rhythm with nonspecific subtle inferolateral ST changes.  Long discussion with her. With her coronary obstructive disease and prior stenting 13 years ago I discussed potential definitive cardiac catheterization.  She tells me her husband will be undergoing surgery in 2 weeks and she would prefer to defer this presently.  I am initiating additional therapy with isosorbide 30 mg daily.  She had not taken her carvedilol this morning which may account for her pulse at 88.  She has been on 25 mg twice a day.  She is fasting and I am checking a comprehensive metabolic panel, CBC, TSH, fasting lipid studies and will also check an LP(a).  I will schedule Her for a nuclear PET scan to assess for ischemia.  She is to contact our office if she develops increasing symptomatology.  I will see her back in the office in 4 weeks for reevaluation or sooner as needed.  Lennette Bihari, MD, [facility]  07/26/2023 9:48 AM

## 2023-07-28 LAB — COMPREHENSIVE METABOLIC PANEL
ALT: 22 [IU]/L (ref 0–32)
AST: 12 [IU]/L (ref 0–40)
Albumin: 4.4 g/dL (ref 3.8–4.8)
Alkaline Phosphatase: 51 [IU]/L (ref 44–121)
BUN/Creatinine Ratio: 17 (ref 12–28)
BUN: 17 mg/dL (ref 8–27)
Bilirubin Total: 0.4 mg/dL (ref 0.0–1.2)
CO2: 25 mmol/L (ref 20–29)
Calcium: 10.5 mg/dL — ABNORMAL HIGH (ref 8.7–10.3)
Chloride: 98 mmol/L (ref 96–106)
Creatinine, Ser: 1.01 mg/dL — ABNORMAL HIGH (ref 0.57–1.00)
Globulin, Total: 2.7 g/dL (ref 1.5–4.5)
Glucose: 72 mg/dL (ref 70–99)
Potassium: 4.7 mmol/L (ref 3.5–5.2)
Sodium: 137 mmol/L (ref 134–144)
Total Protein: 7.1 g/dL (ref 6.0–8.5)
eGFR: 58 mL/min/{1.73_m2} — ABNORMAL LOW (ref >59)

## 2023-07-28 LAB — LIPID PANEL
Chol/HDL Ratio: 3.5 {ratio} (ref 0.0–4.4)
Cholesterol, Total: 129 mg/dL (ref 100–199)
HDL: 37 mg/dL — ABNORMAL LOW (ref >39)
LDL Chol Calc (NIH): 71 mg/dL (ref 0–99)
Triglycerides: 114 mg/dL (ref 0–149)
VLDL Cholesterol Cal: 21 mg/dL (ref 5–40)

## 2023-07-28 LAB — TSH: TSH: 2.28 u[IU]/mL (ref 0.450–4.500)

## 2023-07-28 LAB — CBC
Hematocrit: 35.5 % (ref 34.0–46.6)
Hemoglobin: 11.2 g/dL (ref 11.1–15.9)
MCH: 26.4 pg — ABNORMAL LOW (ref 26.6–33.0)
MCHC: 31.5 g/dL (ref 31.5–35.7)
MCV: 84 fL (ref 79–97)
Platelets: 388 10*3/uL (ref 150–450)
RBC: 4.24 x10E6/uL (ref 3.77–5.28)
RDW: 15.2 % (ref 11.7–15.4)
WBC: 6.7 10*3/uL (ref 3.4–10.8)

## 2023-07-28 LAB — LIPOPROTEIN A (LPA): Lipoprotein (a): 29.4 nmol/L (ref <75.0)

## 2023-08-10 ENCOUNTER — Encounter (HOSPITAL_COMMUNITY): Payer: Self-pay

## 2023-08-11 ENCOUNTER — Telehealth (HOSPITAL_COMMUNITY): Payer: Self-pay | Admitting: Emergency Medicine

## 2023-08-11 NOTE — Telephone Encounter (Signed)
Reaching out to patient to offer assistance regarding upcoming cardiac imaging study; pt verbalizes understanding of appt date/time, parking situation and where to check in, pre-test NPO status and medications ordered, and verified current allergies; name and call back number provided for further questions should they arise Cayne Yom RN Navigator Cardiac Imaging [facility] [phone] office [phone] cell 

## 2023-08-13 ENCOUNTER — Other Ambulatory Visit: Payer: Self-pay | Admitting: Cardiovascular Disease

## 2023-08-15 ENCOUNTER — Encounter (HOSPITAL_COMMUNITY)
Admission: RE | Admit: 2023-08-15 | Discharge: 2023-08-15 | Disposition: A | Discharge status: Home / Self Care | Payer: Medicare HMO | Source: Ambulatory Visit | Attending: Cardiovascular Disease | Admitting: Cardiovascular Disease

## 2023-08-15 DIAGNOSIS — I2089 Other forms of angina pectoris: Secondary | ICD-10-CM | POA: Insufficient documentation

## 2023-08-15 LAB — NM PET CT CARDIAC PERFUSION MULTI W/ABSOLUTE BLOODFLOW
Base ST Depression (mm): 0.5 mm
LV dias vol: 229 mL (ref 46–106)
LV sys vol: 185 mL
MBFR: 0.93
Nuc Rest EF: 19 %
Nuc Stress EF: 20 %
Peak HR: 100 {beats}/min
Rest HR: 94 {beats}/min
Rest MBF: 0.83 ml/g/min
Rest Nuclear Isotope Dose: 24.7 mCi
ST Depression (mm): 1.5 mm
Stress MBF: 0.77 ml/g/min
Stress Nuclear Isotope Dose: 24.7 mCi

## 2023-08-15 MED ORDER — REGADENOSON 0.4 MG/5ML IV SOLN
INTRAVENOUS | Status: AC
  Filled 2023-08-15: qty 5

## 2023-08-15 MED ORDER — RUBIDIUM RB82 GENERATOR (RUBYFILL)
24.7200 | PACK | Freq: Once | INTRAVENOUS | Status: AC
  Administered 2023-08-15: 24.72 via INTRAVENOUS

## 2023-08-15 MED ORDER — RUBIDIUM RB82 GENERATOR (RUBYFILL)
24.7300 | PACK | Freq: Once | INTRAVENOUS | Status: AC
  Administered 2023-08-15: 24.73 via INTRAVENOUS

## 2023-08-15 MED ORDER — REGADENOSON 0.4 MG/5ML IV SOLN
0.4000 mg | Freq: Once | INTRAVENOUS | Status: AC
  Administered 2023-08-15: 0.4 mg via INTRAVENOUS

## 2023-08-23 ENCOUNTER — Encounter: Payer: Self-pay | Admitting: Cardiovascular Disease

## 2023-08-23 ENCOUNTER — Ambulatory Visit: Payer: Medicare HMO | Attending: Cardiovascular Disease | Admitting: Cardiovascular Disease

## 2023-08-23 VITALS — BP 128/70 | HR 79 | Ht 64.0 in | Wt 214.6 lb

## 2023-08-23 DIAGNOSIS — I2089 Other forms of angina pectoris: Secondary | ICD-10-CM

## 2023-08-23 DIAGNOSIS — I255 Ischemic cardiomyopathy: Secondary | ICD-10-CM

## 2023-08-23 DIAGNOSIS — E785 Hyperlipidemia, unspecified: Secondary | ICD-10-CM

## 2023-08-23 DIAGNOSIS — I2510 Atherosclerotic heart disease of native coronary artery without angina pectoris: Secondary | ICD-10-CM | POA: Diagnosis not present

## 2023-08-23 DIAGNOSIS — Z9861 Coronary angioplasty status: Secondary | ICD-10-CM | POA: Diagnosis not present

## 2023-08-23 DIAGNOSIS — Z794 Long term (current) use of insulin: Secondary | ICD-10-CM

## 2023-08-23 DIAGNOSIS — I10 Essential (primary) hypertension: Secondary | ICD-10-CM

## 2023-08-23 DIAGNOSIS — E119 Type 2 diabetes mellitus without complications: Secondary | ICD-10-CM

## 2023-08-23 MED ORDER — ISOSORBIDE DINITRATE 30 MG PO TABS: 45.0000 mg | ORAL_TABLET | Freq: Four times a day (QID) | ORAL | 2 refills | Status: DC

## 2023-08-23 NOTE — H&P (View-Only) (Signed)
 patient ID: Diana [name], female   DOB: 1946-04-23, 77 y.o.   MRN: [id number]        HPI: Diana [name] is a 77 y.o. female presents to the office today for a cardiology evaluation and follow-up of her NM PET/CT cardiac perfusion study.  Diana [name] has CAD and in July/August 2011 she underwent staged intervention with PTCA and stenting of a 95% ostial LAD stenosis (3.5x12 mm promiscuity a stent) postdilated 3.75 mm, and RCA  with tandem 3.0x28 and 2.5x23 mm stents. She has a history of hyperlipidemia. In January 2012 while on Crestor she developed mild transaminase elevation leading to its discontinuance.  She has a history of diabetes mellitus which has been poorly controlled, GERD,  remote history of pneumonia. An echo Doppler study in March 2014 showed normal systolic function with grade 1 diastolic dysfunction. She did have mild aortic sclerosis with mild MR and mild LA dilation. I scheduled her for a two-year followup nuclear perfusion study but insurance I denied this.  Laboratory in April 2014 showed a glucose of 169. At that time, her cholesterol was 166 LDL 81 triglycerides were 347 and hemoglobin A1c was 9.0. Her primary physician is increased her insulin. An NMR evaluation showed an LDL cholesterol of 70 but due to her increased small LDL particles and 1086, LDL particle number was still significantly increased at 1383. HDL was 44 triglycerides are still elevated at 180. Recent hemoglobin A1c was 87. ,  She was started on Zetia, which she had tolerated.  Diana [name] underwent a nuclear perfusion study on 06/04/2013 which continued to show normal perfusion and function. Ejection fraction was 70%. There was no evidence for scar or ischemia.    When I saw her in March 2018  she was without chest pain or shortness of breath.  She was no longer taking insulin for her diabetes mellitus.    I saw her in February 2019.  At that time she remained stable and was without chest pain.   She has been  on Zetia 10 mg at fish oil for hyperlipidemia.  A  lipid panel 1 week prior to that evaluation had shown a total cholesterol 164, HDL 46, triglycerides 140, and LDL 94.  She has continued to take aspirin and Plavix.  She is on losartan HCT 50/12.5 and carvedilol 12.5 mm twice a day for blood pressure and her CAD.  She was on metformin 500 mg twice a day, glimepiride 1 mg twice a day, and Actos 15 mg for diabetes mellitus.  She has a peripheral neuropathy on gabapentin.  GERD is controlled with Protonix.  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  She obtains fiber, and is involved in breeding, boarding, and rescues other animals.    I saw her in February 2021 after not having seen her in 2 years.  At that time she was continuing to do well and was without chest pain or shortness of breath.  She was now seeing Dr. Corwin [name] for endocrinology and continues to be on metformin, glimepiride and was recently started on Ozempic.  She was no longer on pioglitazone.  She has continued to be on Zetia and rosuvastatin 10 mg for hyperlipidemia and  DAPT with aspirin/Plavix.  She noticed rare episodes of lightheadedness but denies any orthostatic symptoms.  She was unaware of palpitations.  I last saw her on May 11, 2021.  She has continued to see Dr. Chrissie Noa  [name] at [facility] for her endocrinology care and recently her hemoglobin A1c had risen to 12.  She continues to be on aspirin and Plavix for DAPT.  She is on Zetia and rosuvastatin for hyperlipidemia.  She has not had any chest pain and continues to be on carvedilol 12.5 mg, losartan HCT 50/12.5 mg for hypertension.  She is on metformin in addition to glimepiride and Ozempic now for her diabetes mellitus.  BMI was 35.3 and weight loss and increased exercise was recommended.  She was evaluated in the emergency room on September 05, 2021 with neck pain felt to be cervical and treated with Solu-Medrol.  I saw her on May 14, 2023. Since I last saw her, she continues to be followed at [facility] for primary care and sees Diana Richer, NP.  She had experienced very mild chest discomfort when she was walking very fast which resolved with increasing her carvedilol dose to now 25 mg twice a day.  She admits to 12 pound purposeful weight loss.  She is on DAPT with aspirin/Plavix.  She continues to be on increased carvedilol to 25 mg twice a day,  losartan HCT 50/12.5 mg for hypertension.  She is on insulin and metformin in addition to Ozempic for diabetes.  She is on rosuvastatin and Zetia with omega-3 fatty acid for mixed hyperlipidemia.    I last saw her on July 26, 2023.  She was continuing to work at [employer] [place] in [place] with [employer].  Over the last year, she has noticed episodes of chest tightness.  Of increasing walking.  Most of the symptoms abate spontaneously with rest.  However, she has taken sublingual nitroglycerin particularly if they were associated with diaphoresis with loosening of chest pain symptomatology.  Currently, she is on aspirin/clopidogrel for DAPT.  She takes carvedilol 25 mg twice a day but she did not take her dose this morning yet, [place] HCT 50/12.5 mg and also takes separate hydrochlorothiazide 25 mg.  She does have some ankle swelling.  He is on Zetia 10 mg and rosuvastatin 10 mg 1 time per week.  Laboratory in July 2023 showed total cholesterol 127 with LDL cholesterol 57 HDL 38 and triglycerides 196.  Is diabetic on metformin and Novolin insulin as well as Ozempic.  She no longer takes glimepiride.  She is on pantoprazole for GERD.  Her prior urinary care provider Diana [name] retired.  She now sees Diana Bosch, NP in [facility].  During her her evaluation, with her exertional chest pain symptomatology I had a long discussion with her.  I reviewed potential for definitive cardiac catheterization.  Husband was about to undergo knee surgery and as  result she preferred not to have this done presently.  As result I scheduled her for a nuclear medicine CT cardiac perfusion study and recommended follow-up laboratory including assessment of LP(a).  Diana [name] underwent her nuclear medicine PET/CT cardiac perfusion study on August 15, 2023.  This was significantly abnormal with findings consistent with infarction with peri-infarct ischemia in the RCA territory.  There was very poor myocardial blood flow reserve suggesting multivessel coronary insufficiency left ventricular systolic function was severely depressed.  Rest EF was 19% with stress EF at 20%.  End diastolic cavity size was severely enlarged was systolic cavity size.  Cardial blood flow was 0.83 mL/g/min at rest and 0.77 mL/g/min at stress.  Following her nuclear perfusion study, Diana [name] was notified of the results and was added onto  my schedule today for follow-up Cardiologic evaluation.  Presently, she has felt fairly well but does experience some chest tightness particularly if she walks uphill she is unaware of any cardiac arrhythmia.  She denies any significant increase shortness of breath.  Presents for evaluation.  Past Medical History:  Diagnosis Date   CAD (coronary artery disease) 7/12-8/12   staged LAD/RCA DES   Diabetes mellitus (HCC)    Diastolic dysfunction 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    Dyslipidemia    HTN (hypertension)     Past Surgical History:  Procedure Laterality Date   PERCUTANEOUS CORONARY STENT INTERVENTION (PCI-S)  05/14/10   LAD   PERCUTANEOUS CORONARY STENT INTERVENTION (PCI-S)  05/17/10   RCA    Allergies  Allergen Reactions   Lisinopril Cough   Sulfa Antibiotics Hives    Current Outpatient Medications  Medication Sig Dispense Refill   aspirin 81 MG tablet Take 243 mg by mouth daily.     BD INSULIN SYRINGE U/F 1/2UNIT 31G X 5/16" 0.3 ML MISC      Beta Carotene (VITAMIN A) 25000 UNIT capsule Take 25,000 Units by mouth daily.     Biotin 5000 MCG CAPS Take  1 capsule by mouth daily.     carvedilol (COREG) 25 MG tablet Take 25 mg by mouth 2 (two) times daily with a meal.     clopidogrel (PLAVIX) 75 MG tablet TAKE 1 TABLET BY MOUTH EVERY DAY 90 tablet 3   ezetimibe (ZETIA) 10 MG tablet TAKE 1 TABLET BY MOUTH EVERY DAY 90 tablet 2   fish oil-omega-3 fatty acids 1000 MG capsule Take 1 g by mouth daily.     gabapentin (NEURONTIN) 100 MG capsule Take 100 mg by mouth See admin instructions. Take 100 mg at bedtime, may take an additional 100 mg twice daily as needed for pain     hydrochlorothiazide (HYDRODIURIL) 25 MG tablet Take 25 mg by mouth daily.     insulin NPH-regular Human (NOVOLIN 70/30) (70-30) 100 UNIT/ML injection Inject 60 Units into the skin 2 (two) times daily.     isosorbide dinitrate (ISORDIL) 30 MG tablet Take 1.5 tablets (45 mg total) by mouth 4 (four) times daily. 135 tablet 2   loratadine (CLARITIN) 10 MG tablet Take 10 mg by mouth daily.     losartan-hydrochlorothiazide (HYZAAR) 50-12.5 MG tablet TAKE 1 TABLET BY MOUTH EVERY DAY. NEED APPT FOR REFILLS 90 tablet 3   metFORMIN (GLUCOPHAGE) 1000 MG tablet Take 1,000 mg by mouth 2 (two) times daily with a meal.     nitroGLYCERIN (NITROSTAT) 0.4 MG SL tablet Place 1 tablet (0.4 mg total) under the tongue every 5 (five) minutes as needed for chest pain. 25 tablet 3   pantoprazole (PROTONIX) 40 MG tablet Take 1 tablet by mouth daily. Reported on 03/24/2016     rosuvastatin (CRESTOR) 10 MG tablet TAKE 1 TABLET BY MOUTH ONE TIME PER WEEK (Patient taking differently: Take 10 mg by mouth every Friday.) 12 tablet 3   Semaglutide,0.25 or 0.5MG /DOS, (OZEMPIC, 0.25 OR 0.5 MG/DOSE,) 2 MG/3ML SOPN Inject 0.5 mg into the skin once a week. (Patient not taking: Reported on 08/24/2023) 3 mL 2   cyanocobalamin (VITAMIN B12) 1000 MCG tablet Take 2,000 mcg by mouth daily.     naproxen sodium (ALEVE) 220 MG tablet Take 220 mg by mouth at bedtime as needed (pain).     Oyster Shell (OYSTER CALCIUM) 500 MG TABS tablet  Take 500 mg of elemental calcium by mouth 2 (two)  times a week.     No current facility-administered medications for this visit.    Social History   Socioeconomic History   Marital status: Married    Spouse name: Not on file   Number of children: Not on file   Years of education: Not on file   Highest education level: Not on file  Occupational History   Not on file  Tobacco Use   Smoking status: Never   Smokeless tobacco: Never  Substance and Sexual Activity   Alcohol use: Yes    Comment: Occasionally-very rare   Drug use: Not on file   Sexual activity: Not on file  Other Topics Concern   Not on file  Social History Narrative   Not on file   Social Determinants of Health   Financial Resource Strain: Not on file  Food Insecurity: Not on file  Transportation Needs: Not on file  Physical Activity: Not on file  Stress: Not on file  Social Connections: Not on file  Intimate Partner Violence: Not on file    ROS General: Negative; No fevers, chills, or night sweats;  HEENT: Negative; No changes in vision or hearing, sinus congestion, difficulty swallowing Pulmonary: Negative; No cough, wheezing, shortness of breath, hemoptysis Cardiovascular: See history of present illness GI: GERD GU: Negative; No dysuria, hematuria, or difficulty voiding Musculoskeletal: Negative; no myalgias, joint pain, or weakness Hematologic/Oncology: Negative; no easy bruising, bleeding Endocrine: Positive for diabetes; no heat/cold intolerance; Neuro: Negative; no changes in balance, headaches Skin: Negative; No rashes or skin lesions Psychiatric: Negative; No behavioral problems, depression Sleep: Negative; No snoring, daytime sleepiness, hypersomnolence, bruxism, restless legs, hypnogognic hallucinations, no cataplexy Other comprehensive 14 point system review is negative.   PE BP 128/70 (BP Location: Left Arm, Patient Position: Sitting, Cuff Size: Normal)   Pulse 79   Ht 5\' 4"  (1.626 m)    Wt 214 lb 9.6 oz (97.3 kg)   SpO2 97%   BMI 36.84 kg/m    Repeat blood pressure by me was 122/70  Wt Readings from Last 3 Encounters:  08/23/23 214 lb 9.6 oz (97.3 kg)  07/26/23 211 lb 6.4 oz (95.9 kg)  05/13/22 209 lb (94.8 kg)   General: Alert, oriented, no distress.  Skin: normal turgor, no rashes, warm and dry HEENT: Normocephalic, atraumatic. Pupils equal round and reactive to light; sclera anicteric; extraocular muscles intact;  Nose without nasal septal hypertrophy Mouth/Parynx benign; Mallinpatti scale Neck: No JVD, no carotid bruits; normal carotid upstroke Lungs: clear to ausculatation and percussion; no wheezing or rales Chest wall: without tenderness to palpitation Heart: PMI not displaced, RRR, s1 s2 normal, 1/6 systolic murmur, no diastolic murmur, no rubs, gallops, thrills, or heaves Abdomen: soft, nontender; no hepatosplenomehaly, BS+; abdominal aorta nontender and not dilated by palpation. Back: no CVA tenderness Pulses 2+ Musculoskeletal: full range of motion, normal strength, no joint deformities Extremities: trivial edema; no clubbing cyanosis or edema, Homan's sign negative  Neurologic: grossly nonfocal; Cranial nerves grossly wnl Psychologic: Normal mood and affect    EKG Interpretation Date/Time:  Wednesday August 23 2023 16:48:54 EST Ventricular Rate:  78 PR Interval:  164 QRS Duration:  112 QT Interval:  406 QTC Calculation: 462 R Axis:   74  Text Interpretation: Sinus rhythm with occasional Premature ventricular complexes Possible Left atrial enlargement Nonspecific T wave abnormality When compared with ECG of 26-Jul-2023 08:44, Premature ventricular complexes are now Present Nonspecific T wave abnormality now evident in Anterior leads Confirmed by Nicki Guadalajara ([id number]) on 08/23/2023  5:34:38 PM      May 13, 2022 ECG (independently read by me): NSR at 84, no ectopy, no STT changes, normal intervals  May 11, 2021 ECG (independently read by me):  Normal sinus rhythm at 87 bpm.  No ectopy.  Normal intervals.  February 2021 ECG (independently read by me): Normal sinus rhythm at 82; no ectopy  February 2019 ECG (independently read by me): Normal sinus rhythm at 72 bpm.  Borderline left atrial enlargement.  No ST segment changes.  Normal intervals.QTc 464 msec  March 2018 ECG (independently read by me): Normal sinus rhythm at 72 bpm.  No ectopy.  Normal intervals.  November 2016 ECG (independently read by me): Normal sinus rhythm at 85 bpm.  Normal intervals.  No significant ST segment changes.  May 2015 ECG (independently read by me): Normal sinus rhythm at 74 beats per minute.  Mild T wave changes V1, V2, and V3.  LABS:     Latest Ref Rng & Units 07/26/2023    9:55 AM 05/13/2022    8:47 AM 05/18/2021    9:03 AM  BMP  Glucose 70 - 99 mg/dL 72  161  096   BUN 8 - 27 mg/dL 17  18  11    Creatinine 0.57 - 1.00 mg/dL 0.45  4.09  8.11   BUN/Creat Ratio 12 - 28 17  20  15    Sodium 134 - 144 mmol/L 137  137  141   Potassium 3.5 - 5.2 mmol/L 4.7  4.5  4.3   Chloride 96 - 106 mmol/L 98  97  101   CO2 20 - 29 mmol/L 25  25  24    Calcium 8.7 - 10.3 mg/dL 91.4  9.3  9.2       Latest Ref Rng & Units 07/26/2023    9:55 AM 05/13/2022    8:47 AM 05/18/2021    9:03 AM  Hepatic Function  Total Protein 6.0 - 8.5 g/dL 7.1  6.9  6.9   Albumin 3.8 - 4.8 g/dL 4.4  4.3  4.3   AST 0 - 40 IU/L 12  17  21    ALT 0 - 32 IU/L 22  28  31    Alk Phosphatase 44 - 121 IU/L 51  64  48   Total Bilirubin 0.0 - 1.2 mg/dL 0.4  <7.8  <2.9       Latest Ref Rng & Units 07/26/2023    9:55 AM 05/13/2022    8:47 AM 05/18/2021    9:03 AM  CBC  WBC 3.4 - 10.8 x10E3/uL 6.7  6.8  5.6   Hemoglobin 11.1 - 15.9 g/dL 56.2  13.0  86.5   Hematocrit 34.0 - 46.6 % 35.5  36.4  39.7   Platelets 150 - 450 x10E3/uL 388  331  322    Lab Results  Component Value Date   MCV 84 07/26/2023   MCV 85 05/13/2022   MCV 84 05/18/2021   Lab Results  Component Value Date   TSH 2.280  07/26/2023    Lipid Panel     Component Value Date/Time   CHOL 129 07/26/2023 0955   CHOL 122 02/07/2014 0902   TRIG 114 07/26/2023 0955   TRIG 160 (H) 02/07/2014 0902   HDL 37 (L) 07/26/2023 0955   HDL 46 02/07/2014 0902   CHOLHDL 3.5 07/26/2023 0955   CHOLHDL 3.6 11/17/2017 0833   VLDL 26 12/21/2016 0912   LDLCALC 71 07/26/2023 0955   LDLCALC 94 11/17/2017 7846  LDLCALC 44 02/07/2014 0902    RADIOLOGY: No results found.   IMPRESSION:  1. CAD in native artery   2. Ischemic cardiomyopathy   3. History of percutaneous coronary intervention: 2011, LAD, RCA   4. Exertional angina (HCC)   5. Primary hypertension   6. Hyperlipidemia with target LDL less than 55   7. Type 2 diabetes mellitus without complication, with long-term current use of insulin (HCC)      ASSESSMENT AND PLAN: Diana [name] is a 77 year old female who underwent two-vessel intervention including a 95% ostial LAD stenosis with PTCA stenting and tandem stenting to her RCA in 2011. A nuclear perfusion study in 2014 remained low risk.   She has a history of moderate obesity, diabetes mellitus, and peripheral neuropathy.  I saw her in 2023 and at that time she began to notice some very mild chest discomfort when she was walking very fast and her dose of carvedilol was increased from 12.5 twice daily to 25 mg twice a day with improvement.  I have not seen her over the past 15 months.  I saw her on July 26, 2023 for follow-up evaluation.  At that time she was experiencing mild exertional chest tightness if she walked for prolonged time uphill as well as associated with mild diaphoresis symptoms abated quickly with sublingual nitroglycerin.  I saw her during that evaluation I recommended definitive cardiac catheterization.  However, her husband was just about to undergo right knee surgery and she did not want to have cardiac catheterization at that time.  As result I scheduled her for a nuclear medicine PET/CT cardiac  perfusion study.  This was markedly abnormal as noted above with EF estimate approximately 20%.  Findings were consistent with infarction with peri-infarction ischemia in the RCA distribution and she also had very poor myocardial blood flow reserve suggesting multivessel coronary insufficiency.  She was contacted and is seen today for evaluation.  In the office today I have strongly recommended definitive cardiac catheterization.  Recommended slight titration of isosorbide to 45 mg and did not increase this further since she had noted some mild lightheadedness.  With her reduced LV function I will schedule her for right and left cardiac catheterization to be done either later this week or next Monday morning.  However I will also try to get a 2D echo Doppler study and laboratory will need to be obtained.  We will contact her in the in the am to see if we can schedule her for her echo Doppler study Thursday or if not on Friday.  I discussed repeat cardiac catheterization and risk benefits of the procedure which she is fully aware.  She was advised to limit any activity until definitive cardiac catheterization.   Lennette Bihari, MD, [facility]  08/24/2023 2:52 PM

## 2023-08-23 NOTE — Progress Notes (Unsigned)
patient ID: Diana [name], female   DOB: 1946-04-23, 77 y.o.   MRN: [id number]        HPI: Diana [name] is a 77 y.o. female presents to the office today for a cardiology evaluation and follow-up of her NM PET/CT cardiac perfusion study.  [name] has CAD and in July/August 2011 she underwent staged intervention with PTCA and stenting of a 95% ostial LAD stenosis (3.5x12 mm promiscuity a stent) postdilated 3.75 mm, and RCA  with tandem 3.0x28 and 2.5x23 mm stents. She has a history of hyperlipidemia. In January 2012 while on Crestor she developed mild transaminase elevation leading to its discontinuance.  She has a history of diabetes mellitus which has been poorly controlled, GERD,  remote history of pneumonia. An echo Doppler study in March 2014 showed normal systolic function with grade 1 diastolic dysfunction. She did have mild aortic sclerosis with mild MR and mild LA dilation. I scheduled her for a two-year followup nuclear perfusion study but insurance I denied this.  Laboratory in April 2014 showed a glucose of 169. At that time, her cholesterol was 166 LDL 81 triglycerides were 347 and hemoglobin A1c was 9.0. Her primary physician is increased her insulin. An NMR evaluation showed an LDL cholesterol of 70 but due to her increased small LDL particles and 1086, LDL particle number was still significantly increased at 1383. HDL was 44 triglycerides are still elevated at 180. Recent hemoglobin A1c was 87. ,  She was started on Zetia, which she had tolerated.  [name] underwent a nuclear perfusion study on 06/04/2013 which continued to show normal perfusion and function. Ejection fraction was 70%. There was no evidence for scar or ischemia.    When I saw her in March 2018  she was without chest pain or shortness of breath.  She was no longer taking insulin for her diabetes mellitus.    I saw her in February 2019.  At that time she remained stable and was without chest pain.   She has been  on Zetia 10 mg at fish oil for hyperlipidemia.  A  lipid panel 1 week prior to that evaluation had shown a total cholesterol 164, HDL 46, triglycerides 140, and LDL 94.  She has continued to take aspirin and Plavix.  She is on losartan HCT 50/12.5 and carvedilol 12.5 mm twice a day for blood pressure and her CAD.  She was on metformin 500 mg twice a day, glimepiride 1 mg twice a day, and Actos 15 mg for diabetes mellitus.  She has a peripheral neuropathy on gabapentin.  GERD is controlled with Protonix.  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  She obtains fiber, and is involved in breeding, boarding, and rescues other animals.    I saw her in February 2021 after not having seen her in 2 years.  At that time she was continuing to do well and was without chest pain or shortness of breath.  She was now seeing [name] for endocrinology and continues to be on metformin, glimepiride and was recently started on Ozempic.  She was no longer on pioglitazone.  She has continued to be on Zetia and rosuvastatin 10 mg for hyperlipidemia and  DAPT with aspirin/Plavix.  She noticed rare episodes of lightheadedness but denies any orthostatic symptoms.  She was unaware of palpitations.  I last saw her on May 11, 2021.  She has continued to see [name] at [facility] for her endocrinology care and recently her hemoglobin A1c had risen to 12.  She continues to be on aspirin and Plavix for DAPT.  She is on Zetia and rosuvastatin for hyperlipidemia.  She has not had any chest pain and continues to be on carvedilol 12.5 mg, losartan HCT 50/12.5 mg for hypertension.  She is on metformin in addition to glimepiride and Ozempic now for her diabetes mellitus.  BMI was 35.3 and weight loss and increased exercise was recommended.  She was evaluated in the emergency room on September 05, 2021 with neck pain felt to be cervical and treated with Solu-Medrol.  I saw her on May 14, 2023. Since I last saw her, she continues to be followed at [facility] for primary care and sees [name], NP.  She had experienced very mild chest discomfort when she was walking very fast which resolved with increasing her carvedilol dose to now 25 mg twice a day.  She admits to 12 pound purposeful weight loss.  She is on DAPT with aspirin/Plavix.  She continues to be on increased carvedilol to 25 mg twice a day,  losartan HCT 50/12.5 mg for hypertension.  She is on insulin and metformin in addition to Ozempic for diabetes.  She is on rosuvastatin and Zetia with omega-3 fatty acid for mixed hyperlipidemia.    I last saw her on July 26, 2023.  She was continuing to work at [employer] [place] in [place] with [employer].  Over the last year, she has noticed episodes of chest tightness.  Of increasing walking.  Most of the symptoms abate spontaneously with rest.  However, she has taken sublingual nitroglycerin particularly if they were associated with diaphoresis with loosening of chest pain symptomatology.  Currently, she is on aspirin/clopidogrel for DAPT.  She takes carvedilol 25 mg twice a day but she did not take her dose this morning yet, Sartain HCT 50/12.5 mg and also takes separate hydrochlorothiazide 25 mg.  She does have some ankle swelling.  He is on Zetia 10 mg and rosuvastatin 10 mg 1 time per week.  Laboratory in July 2023 showed total cholesterol 127 with LDL cholesterol 57 HDL 38 and triglycerides 196.  Is diabetic on metformin and Novolin insulin as well as Ozempic.  She no longer takes glimepiride.  She is on pantoprazole for GERD.  Her prior urinary care provider [name] retired.  She now sees [name], NP in [facility].  During her her evaluation, with her exertional chest pain symptomatology I had a long discussion with her.  I reviewed potential for definitive cardiac catheterization.  Husband was about to undergo knee surgery and as  result she preferred not to have this done presently.  As result I scheduled her for a nuclear medicine CT cardiac perfusion study and recommended follow-up laboratory including assessment of LP(a).  [name] underwent her nuclear medicine PET/CT cardiac perfusion study on August 15, 2023.  This was significantly abnormal with findings consistent with infarction with peri-infarct ischemia in the RCA territory.  There was very poor myocardial blood flow reserve suggesting multivessel coronary insufficiency left ventricular systolic function was severely depressed.  Rest EF was 19% with stress EF at 20%.  End diastolic cavity size was severely enlarged was systolic cavity size.  Cardial blood flow was 0.83 mL/g/min at rest and 0.77 mL/g/min at stress.  Following her nuclear perfusion study, Ms. Schlag was notified of the results and was added onto  my schedule today for follow-up Cardiologic evaluation.  Presently, she has felt fairly well but does experience some chest tightness particularly if she walks uphill she is unaware of any cardiac arrhythmia.  She denies any significant increase shortness of breath.  Presents for evaluation.  Past Medical History:  Diagnosis Date   CAD (coronary artery disease) 7/12-8/12   staged LAD/RCA DES   Diabetes mellitus (HCC)    Diastolic dysfunction 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    Dyslipidemia    HTN (hypertension)     Past Surgical History:  Procedure Laterality Date   PERCUTANEOUS CORONARY STENT INTERVENTION (PCI-S)  05/14/10   LAD   PERCUTANEOUS CORONARY STENT INTERVENTION (PCI-S)  05/17/10   RCA    Allergies  Allergen Reactions   Lisinopril Cough   Sulfa Antibiotics Hives    Current Outpatient Medications  Medication Sig Dispense Refill   aspirin 81 MG tablet Take 243 mg by mouth daily.     BD INSULIN SYRINGE U/F 1/2UNIT 31G X 5/16" 0.3 ML MISC      Beta Carotene (VITAMIN A) 25000 UNIT capsule Take 25,000 Units by mouth daily.     Biotin 5000 MCG CAPS Take  1 capsule by mouth daily.     carvedilol (COREG) 25 MG tablet Take 25 mg by mouth 2 (two) times daily with a meal.     clopidogrel (PLAVIX) 75 MG tablet TAKE 1 TABLET BY MOUTH EVERY DAY 90 tablet 3   ezetimibe (ZETIA) 10 MG tablet TAKE 1 TABLET BY MOUTH EVERY DAY 90 tablet 2   fish oil-omega-3 fatty acids 1000 MG capsule Take 1 g by mouth daily.     gabapentin (NEURONTIN) 100 MG capsule Take 100 mg by mouth See admin instructions. Take 100 mg at bedtime, may take an additional 100 mg twice daily as needed for pain     hydrochlorothiazide (HYDRODIURIL) 25 MG tablet Take 25 mg by mouth daily.     insulin NPH-regular Human (NOVOLIN 70/30) (70-30) 100 UNIT/ML injection Inject 60 Units into the skin 2 (two) times daily.     isosorbide dinitrate (ISORDIL) 30 MG tablet Take 1.5 tablets (45 mg total) by mouth 4 (four) times daily. 135 tablet 2   loratadine (CLARITIN) 10 MG tablet Take 10 mg by mouth daily.     losartan-hydrochlorothiazide (HYZAAR) 50-12.5 MG tablet TAKE 1 TABLET BY MOUTH EVERY DAY. NEED APPT FOR REFILLS 90 tablet 3   metFORMIN (GLUCOPHAGE) 1000 MG tablet Take 1,000 mg by mouth 2 (two) times daily with a meal.     nitroGLYCERIN (NITROSTAT) 0.4 MG SL tablet Place 1 tablet (0.4 mg total) under the tongue every 5 (five) minutes as needed for chest pain. 25 tablet 3   pantoprazole (PROTONIX) 40 MG tablet Take 1 tablet by mouth daily. Reported on 03/24/2016     rosuvastatin (CRESTOR) 10 MG tablet TAKE 1 TABLET BY MOUTH ONE TIME PER WEEK (Patient taking differently: Take 10 mg by mouth every Friday.) 12 tablet 3   Semaglutide,0.25 or 0.5MG /DOS, (OZEMPIC, 0.25 OR 0.5 MG/DOSE,) 2 MG/3ML SOPN Inject 0.5 mg into the skin once a week. (Patient not taking: Reported on 08/24/2023) 3 mL 2   cyanocobalamin (VITAMIN B12) 1000 MCG tablet Take 2,000 mcg by mouth daily.     naproxen sodium (ALEVE) 220 MG tablet Take 220 mg by mouth at bedtime as needed (pain).     Oyster Shell (OYSTER CALCIUM) 500 MG TABS tablet  Take 500 mg of elemental calcium by mouth 2 (two)  times a week.     No current facility-administered medications for this visit.    Social History   Socioeconomic History   Marital status: Married    Spouse name: Not on file   Number of children: Not on file   Years of education: Not on file   Highest education level: Not on file  Occupational History   Not on file  Tobacco Use   Smoking status: Never   Smokeless tobacco: Never  Substance and Sexual Activity   Alcohol use: Yes    Comment: Occasionally-very rare   Drug use: Not on file   Sexual activity: Not on file  Other Topics Concern   Not on file  Social History Narrative   Not on file   Social Determinants of Health   Financial Resource Strain: Not on file  Food Insecurity: Not on file  Transportation Needs: Not on file  Physical Activity: Not on file  Stress: Not on file  Social Connections: Not on file  Intimate Partner Violence: Not on file    ROS General: Negative; No fevers, chills, or night sweats;  HEENT: Negative; No changes in vision or hearing, sinus congestion, difficulty swallowing Pulmonary: Negative; No cough, wheezing, shortness of breath, hemoptysis Cardiovascular: See history of present illness GI: GERD GU: Negative; No dysuria, hematuria, or difficulty voiding Musculoskeletal: Negative; no myalgias, joint pain, or weakness Hematologic/Oncology: Negative; no easy bruising, bleeding Endocrine: Positive for diabetes; no heat/cold intolerance; Neuro: Negative; no changes in balance, headaches Skin: Negative; No rashes or skin lesions Psychiatric: Negative; No behavioral problems, depression Sleep: Negative; No snoring, daytime sleepiness, hypersomnolence, bruxism, restless legs, hypnogognic hallucinations, no cataplexy Other comprehensive 14 point system review is negative.   PE BP 128/70 (BP Location: Left Arm, Patient Position: Sitting, Cuff Size: Normal)   Pulse 79   Ht 5\' 4"  (1.626 m)    Wt 214 lb 9.6 oz (97.3 kg)   SpO2 97%   BMI 36.84 kg/m    Repeat blood pressure by me was 122/70  Wt Readings from Last 3 Encounters:  08/23/23 214 lb 9.6 oz (97.3 kg)  07/26/23 211 lb 6.4 oz (95.9 kg)  05/13/22 209 lb (94.8 kg)   General: Alert, oriented, no distress.  Skin: normal turgor, no rashes, warm and dry HEENT: Normocephalic, atraumatic. Pupils equal round and reactive to light; sclera anicteric; extraocular muscles intact;  Nose without nasal septal hypertrophy Mouth/Parynx benign; Mallinpatti scale Neck: No JVD, no carotid bruits; normal carotid upstroke Lungs: clear to ausculatation and percussion; no wheezing or rales Chest wall: without tenderness to palpitation Heart: PMI not displaced, RRR, s1 s2 normal, 1/6 systolic murmur, no diastolic murmur, no rubs, gallops, thrills, or heaves Abdomen: soft, nontender; no hepatosplenomehaly, BS+; abdominal aorta nontender and not dilated by palpation. Back: no CVA tenderness Pulses 2+ Musculoskeletal: full range of motion, normal strength, no joint deformities Extremities: trivial edema; no clubbing cyanosis or edema, Homan's sign negative  Neurologic: grossly nonfocal; Cranial nerves grossly wnl Psychologic: Normal mood and affect    EKG Interpretation Date/Time:  Wednesday August 23 2023 16:48:54 EST Ventricular Rate:  78 PR Interval:  164 QRS Duration:  112 QT Interval:  406 QTC Calculation: 462 R Axis:   74  Text Interpretation: Sinus rhythm with occasional Premature ventricular complexes Possible Left atrial enlargement Nonspecific T wave abnormality When compared with ECG of 26-Jul-2023 08:44, Premature ventricular complexes are now Present Nonspecific T wave abnormality now evident in Anterior leads Confirmed by Nicki Guadalajara ([id number]) on 08/23/2023  5:34:38 PM      May 13, 2022 ECG (independently read by me): NSR at 84, no ectopy, no STT changes, normal intervals  May 11, 2021 ECG (independently read by me):  Normal sinus rhythm at 87 bpm.  No ectopy.  Normal intervals.  February 2021 ECG (independently read by me): Normal sinus rhythm at 82; no ectopy  February 2019 ECG (independently read by me): Normal sinus rhythm at 72 bpm.  Borderline left atrial enlargement.  No ST segment changes.  Normal intervals.QTc 464 msec  March 2018 ECG (independently read by me): Normal sinus rhythm at 72 bpm.  No ectopy.  Normal intervals.  November 2016 ECG (independently read by me): Normal sinus rhythm at 85 bpm.  Normal intervals.  No significant ST segment changes.  May 2015 ECG (independently read by me): Normal sinus rhythm at 74 beats per minute.  Mild T wave changes V1, V2, and V3.  LABS:     Latest Ref Rng & Units 07/26/2023    9:55 AM 05/13/2022    8:47 AM 05/18/2021    9:03 AM  BMP  Glucose 70 - 99 mg/dL 72  161  096   BUN 8 - 27 mg/dL 17  18  11    Creatinine 0.57 - 1.00 mg/dL 0.45  4.09  8.11   BUN/Creat Ratio 12 - 28 17  20  15    Sodium 134 - 144 mmol/L 137  137  141   Potassium 3.5 - 5.2 mmol/L 4.7  4.5  4.3   Chloride 96 - 106 mmol/L 98  97  101   CO2 20 - 29 mmol/L 25  25  24    Calcium 8.7 - 10.3 mg/dL 91.4  9.3  9.2       Latest Ref Rng & Units 07/26/2023    9:55 AM 05/13/2022    8:47 AM 05/18/2021    9:03 AM  Hepatic Function  Total Protein 6.0 - 8.5 g/dL 7.1  6.9  6.9   Albumin 3.8 - 4.8 g/dL 4.4  4.3  4.3   AST 0 - 40 IU/L 12  17  21    ALT 0 - 32 IU/L 22  28  31    Alk Phosphatase 44 - 121 IU/L 51  64  48   Total Bilirubin 0.0 - 1.2 mg/dL 0.4  <7.8  <2.9       Latest Ref Rng & Units 07/26/2023    9:55 AM 05/13/2022    8:47 AM 05/18/2021    9:03 AM  CBC  WBC 3.4 - 10.8 x10E3/uL 6.7  6.8  5.6   Hemoglobin 11.1 - 15.9 g/dL 56.2  13.0  86.5   Hematocrit 34.0 - 46.6 % 35.5  36.4  39.7   Platelets 150 - 450 x10E3/uL 388  331  322    Lab Results  Component Value Date   MCV 84 07/26/2023   MCV 85 05/13/2022   MCV 84 05/18/2021   Lab Results  Component Value Date   TSH 2.280  07/26/2023    Lipid Panel     Component Value Date/Time   CHOL 129 07/26/2023 0955   CHOL 122 02/07/2014 0902   TRIG 114 07/26/2023 0955   TRIG 160 (H) 02/07/2014 0902   HDL 37 (L) 07/26/2023 0955   HDL 46 02/07/2014 0902   CHOLHDL 3.5 07/26/2023 0955   CHOLHDL 3.6 11/17/2017 0833   VLDL 26 12/21/2016 0912   LDLCALC 71 07/26/2023 0955   LDLCALC 94 11/17/2017 7846  LDLCALC 44 02/07/2014 0902    RADIOLOGY: No results found.   IMPRESSION:  1. CAD in native artery   2. Ischemic cardiomyopathy   3. History of percutaneous coronary intervention: 2011, LAD, RCA   4. Exertional angina (HCC)   5. Primary hypertension   6. Hyperlipidemia with target LDL less than 55   7. Type 2 diabetes mellitus without complication, with long-term current use of insulin (HCC)      ASSESSMENT AND PLAN: Mrs. Treadway is a 77 year old female who underwent two-vessel intervention including a 95% ostial LAD stenosis with PTCA stenting and tandem stenting to her RCA in 2011. A nuclear perfusion study in 2014 remained low risk.   She has a history of moderate obesity, diabetes mellitus, and peripheral neuropathy.  I saw her in 2023 and at that time she began to notice some very mild chest discomfort when she was walking very fast and her dose of carvedilol was increased from 12.5 twice daily to 25 mg twice a day with improvement.  I have not seen her over the past 15 months.  I saw her on July 26, 2023 for follow-up evaluation.  At that time she was experiencing mild exertional chest tightness if she walked for prolonged time uphill as well as associated with mild diaphoresis symptoms abated quickly with sublingual nitroglycerin.  I saw her during that evaluation I recommended definitive cardiac catheterization.  However, her husband was just about to undergo right knee surgery and she did not want to have cardiac catheterization at that time.  As result I scheduled her for a nuclear medicine PET/CT cardiac  perfusion study.  This was markedly abnormal as noted above with EF estimate approximately 20%.  Findings were consistent with infarction with peri-infarction ischemia in the RCA distribution and she also had very poor myocardial blood flow reserve suggesting multivessel coronary insufficiency.  She was contacted and is seen today for evaluation.  In the office today I have strongly recommended definitive cardiac catheterization.  Recommended slight titration of isosorbide to 45 mg and did not increase this further since she had noted some mild lightheadedness.  With her reduced LV function I will schedule her for right and left cardiac catheterization to be done either later this week or next Monday morning.  However I will also try to get a 2D echo Doppler study and laboratory will need to be obtained.  We will contact her in the in the am to see if we can schedule her for her echo Doppler study Thursday or if not on Friday.  I discussed repeat cardiac catheterization and risk benefits of the procedure which she is fully aware.  She was advised to limit any activity until definitive cardiac catheterization.   Lennette Bihari, MD, [facility]  08/24/2023 2:52 PM

## 2023-08-23 NOTE — Patient Instructions (Signed)
Medication Instructions:  Start taking Isosorbide 1.5 (45 MG total) tablets by mouth daily  *If you need a refill on your cardiac medications before your next appointment, please call your pharmacy*   Lab Work: None Ordered If you have labs (blood work) drawn today and your tests are completely normal, you will receive your results only by: MyChart Message (if you have MyChart) OR A paper copy in the mail If you have any lab test that is abnormal or we need to change your treatment, we will call you to review the results.   Testing/Procedures:  Left and Right Heart Catheterization  Your physician has requested that you have an echocardiogram. Echocardiography is a painless test that uses sound waves to create images of your heart. It provides your doctor with information about the size and shape of your heart and how well your heart's chambers and valves are working. This procedure takes approximately one hour. There are no restrictions for this procedure. Please do NOT wear cologne, perfume, aftershave, or lotions (deodorant is allowed). Please arrive 15 minutes prior to your appointment time.  Please note: We ask at that you not bring children with you during ultrasound (echo/ vascular) testing. Due to room size and safety concerns, children are not allowed in the ultrasound rooms during exams. Our front office staff cannot provide observation of children in our lobby area while testing is being conducted. An adult accompanying a patient to their appointment will only be allowed in the ultrasound room at the discretion of the ultrasound technician under special circumstances. We apologize for any inconvenience.    Follow-Up: At [facility], you and your health needs are our priority.  As part of our continuing mission to provide you with exceptional heart care, we have created designated Provider Care Teams.  These Care Teams include your primary Cardiologist (physician) and  Advanced Practice Providers (APPs -  Physician Assistants and Nurse Practitioners) who all work together to provide you with the care you need, when you need it.  We recommend signing up for the patient portal called "MyChart".  Sign up information is provided on this After Visit Summary.  MyChart is used to connect with patients for Virtual Visits (Telemedicine).  Patients are able to view lab/test results, encounter notes, upcoming appointments, etc.  Non-urgent messages can be sent to your provider as well.   To learn more about what you can do with MyChart, go to [email or web address].    Your next appointment:   Dependent on test results   Provider:   Nicki Guadalajara, MD  Other Instructions

## 2023-08-24 ENCOUNTER — Telehealth: Payer: Self-pay | Admitting: *Deleted

## 2023-08-24 ENCOUNTER — Ambulatory Visit (HOSPITAL_COMMUNITY): Payer: Medicare HMO | Attending: Internal Medicine

## 2023-08-24 ENCOUNTER — Telehealth: Payer: Self-pay | Admitting: Cardiovascular Disease

## 2023-08-24 DIAGNOSIS — E785 Hyperlipidemia, unspecified: Secondary | ICD-10-CM

## 2023-08-24 DIAGNOSIS — I10 Essential (primary) hypertension: Secondary | ICD-10-CM

## 2023-08-24 DIAGNOSIS — I2510 Atherosclerotic heart disease of native coronary artery without angina pectoris: Secondary | ICD-10-CM

## 2023-08-24 LAB — ECHOCARDIOGRAM COMPLETE
MV M vel: 5.02 m/s
MV Peak grad: 100.8 mm[Hg]
P 1/2 time: 230 ms
S' Lateral: 5.07 cm

## 2023-08-24 NOTE — Telephone Encounter (Signed)
Patient called to let Merry Proud know she is due to have lab work done this morning but was called in from the wait list to have her Echocardiogram test done this morning.

## 2023-08-24 NOTE — Telephone Encounter (Addendum)
Cardiac Catheterization scheduled at [facility] for: Monday August 28, 2023 1:30 PM Arrival time [facility] Main Entrance A at: 11:30 AM The arrival and procedure times are different than 08/23/23 instructions given to patient, I confirmed these times with [facility].  Nothing to eat after midnight prior to procedure, clear liquids until 5 AM day of procedure.  Medication instructions: -Hold:  Metformin-day of procedure and 48 hours post procedure  Insulin -AM of procedure/1/2 usual dose HS prior to procedure  Ozempic-weekly-pt reports she has not taken in a month Hydrochlorothiazide and Losartan-HCT-AM of procedure -Other usual morning medications can be taken with sips of water including aspirin 81 mg and Plavix 75 mg  Plan to go home the same day, you will only stay overnight if medically necessary.  You must have responsible adult to drive you home.  Someone must be with you the first 24 hours after you arrive home.  Reviewed procedure instructions with patient.

## 2023-08-25 LAB — BASIC METABOLIC PANEL
BUN/Creatinine Ratio: 15 (ref 12–28)
BUN: 16 mg/dL (ref 8–27)
CO2: 23 mmol/L (ref 20–29)
Calcium: 9.1 mg/dL (ref 8.7–10.3)
Chloride: 102 mmol/L (ref 96–106)
Creatinine, Ser: 1.04 mg/dL — ABNORMAL HIGH (ref 0.57–1.00)
Glucose: 57 mg/dL — ABNORMAL LOW (ref 70–99)
Potassium: 4.4 mmol/L (ref 3.5–5.2)
Sodium: 143 mmol/L (ref 134–144)
eGFR: 56 mL/min/{1.73_m2} — ABNORMAL LOW (ref >59)

## 2023-08-25 LAB — CBC
Hematocrit: 33.2 % — ABNORMAL LOW (ref 34.0–46.6)
Hemoglobin: 10.2 g/dL — ABNORMAL LOW (ref 11.1–15.9)
MCH: 25.6 pg — ABNORMAL LOW (ref 26.6–33.0)
MCHC: 30.7 g/dL — ABNORMAL LOW (ref 31.5–35.7)
MCV: 83 fL (ref 79–97)
Platelets: 351 10*3/uL (ref 150–450)
RBC: 3.99 x10E6/uL (ref 3.77–5.28)
RDW: 15.1 % (ref 11.7–15.4)
WBC: 7.4 10*3/uL (ref 3.4–10.8)

## 2023-08-25 NOTE — Telephone Encounter (Signed)
Patient came in yesterday 08/24/23 for blood work and was scheduled for provider requested procedures. All questions were answered and patient verbalized understanding.

## 2023-08-28 ENCOUNTER — Inpatient Hospital Stay (HOSPITAL_COMMUNITY)
Admission: AD | Admit: 2023-08-28 | Discharge: 2023-09-21 | DRG: 216 | Disposition: A | Discharge status: Home Health | Payer: Medicare HMO | Source: Ambulatory Visit | Attending: Thoracic Surgery (Cardiothoracic Vascular Surgery) | Admitting: Thoracic Surgery (Cardiothoracic Vascular Surgery)

## 2023-08-28 ENCOUNTER — Encounter (HOSPITAL_COMMUNITY): Payer: Self-pay | Admitting: Cardiovascular Disease

## 2023-08-28 ENCOUNTER — Other Ambulatory Visit: Payer: Self-pay

## 2023-08-28 ENCOUNTER — Encounter (HOSPITAL_COMMUNITY)
Admission: AD | Disposition: A | Discharge status: Home Health | Payer: Self-pay | Source: Ambulatory Visit | Attending: Thoracic Surgery (Cardiothoracic Vascular Surgery)

## 2023-08-28 DIAGNOSIS — N179 Acute kidney failure, unspecified: Secondary | ICD-10-CM | POA: Diagnosis present

## 2023-08-28 DIAGNOSIS — E782 Mixed hyperlipidemia: Secondary | ICD-10-CM | POA: Diagnosis present

## 2023-08-28 DIAGNOSIS — Z955 Presence of coronary angioplasty implant and graft: Secondary | ICD-10-CM

## 2023-08-28 DIAGNOSIS — R031 Nonspecific low blood-pressure reading: Secondary | ICD-10-CM | POA: Diagnosis not present

## 2023-08-28 DIAGNOSIS — I110 Hypertensive heart disease with heart failure: Secondary | ICD-10-CM | POA: Diagnosis present

## 2023-08-28 DIAGNOSIS — I5043 Acute on chronic combined systolic (congestive) and diastolic (congestive) heart failure: Secondary | ICD-10-CM | POA: Diagnosis present

## 2023-08-28 DIAGNOSIS — I480 Paroxysmal atrial fibrillation: Secondary | ICD-10-CM | POA: Diagnosis present

## 2023-08-28 DIAGNOSIS — Z7982 Long term (current) use of aspirin: Secondary | ICD-10-CM

## 2023-08-28 DIAGNOSIS — E11649 Type 2 diabetes mellitus with hypoglycemia without coma: Secondary | ICD-10-CM | POA: Diagnosis not present

## 2023-08-28 DIAGNOSIS — Z882 Allergy status to sulfonamides status: Secondary | ICD-10-CM

## 2023-08-28 DIAGNOSIS — I25110 Atherosclerotic heart disease of native coronary artery with unstable angina pectoris: Secondary | ICD-10-CM | POA: Diagnosis present

## 2023-08-28 DIAGNOSIS — I340 Nonrheumatic mitral (valve) insufficiency: Secondary | ICD-10-CM | POA: Diagnosis not present

## 2023-08-28 DIAGNOSIS — Z1152 Encounter for screening for COVID-19: Secondary | ICD-10-CM | POA: Diagnosis not present

## 2023-08-28 DIAGNOSIS — K0889 Other specified disorders of teeth and supporting structures: Secondary | ICD-10-CM | POA: Diagnosis present

## 2023-08-28 DIAGNOSIS — R059 Cough, unspecified: Secondary | ICD-10-CM | POA: Diagnosis not present

## 2023-08-28 DIAGNOSIS — J9382 Other air leak: Secondary | ICD-10-CM | POA: Diagnosis not present

## 2023-08-28 DIAGNOSIS — J449 Chronic obstructive pulmonary disease, unspecified: Secondary | ICD-10-CM | POA: Diagnosis present

## 2023-08-28 DIAGNOSIS — Z794 Long term (current) use of insulin: Secondary | ICD-10-CM

## 2023-08-28 DIAGNOSIS — E1165 Type 2 diabetes mellitus with hyperglycemia: Secondary | ICD-10-CM | POA: Diagnosis present

## 2023-08-28 DIAGNOSIS — R251 Tremor, unspecified: Secondary | ICD-10-CM | POA: Diagnosis not present

## 2023-08-28 DIAGNOSIS — I440 Atrioventricular block, first degree: Secondary | ICD-10-CM | POA: Diagnosis present

## 2023-08-28 DIAGNOSIS — J438 Other emphysema: Secondary | ICD-10-CM | POA: Diagnosis present

## 2023-08-28 DIAGNOSIS — Z9889 Other specified postprocedural states: Secondary | ICD-10-CM

## 2023-08-28 DIAGNOSIS — I5021 Acute systolic (congestive) heart failure: Secondary | ICD-10-CM | POA: Diagnosis not present

## 2023-08-28 DIAGNOSIS — Z7984 Long term (current) use of oral hypoglycemic drugs: Secondary | ICD-10-CM

## 2023-08-28 DIAGNOSIS — Z951 Presence of aortocoronary bypass graft: Secondary | ICD-10-CM

## 2023-08-28 DIAGNOSIS — I272 Pulmonary hypertension, unspecified: Secondary | ICD-10-CM | POA: Diagnosis present

## 2023-08-28 DIAGNOSIS — I471 Supraventricular tachycardia, unspecified: Secondary | ICD-10-CM | POA: Diagnosis present

## 2023-08-28 DIAGNOSIS — I255 Ischemic cardiomyopathy: Secondary | ICD-10-CM | POA: Diagnosis present

## 2023-08-28 DIAGNOSIS — E1142 Type 2 diabetes mellitus with diabetic polyneuropathy: Secondary | ICD-10-CM | POA: Diagnosis present

## 2023-08-28 DIAGNOSIS — I4892 Unspecified atrial flutter: Secondary | ICD-10-CM | POA: Diagnosis not present

## 2023-08-28 DIAGNOSIS — Z7901 Long term (current) use of anticoagulants: Secondary | ICD-10-CM

## 2023-08-28 DIAGNOSIS — I2582 Chronic total occlusion of coronary artery: Secondary | ICD-10-CM | POA: Diagnosis present

## 2023-08-28 DIAGNOSIS — I428 Other cardiomyopathies: Secondary | ICD-10-CM | POA: Diagnosis not present

## 2023-08-28 DIAGNOSIS — Z888 Allergy status to other drugs, medicaments and biological substances status: Secondary | ICD-10-CM

## 2023-08-28 DIAGNOSIS — J939 Pneumothorax, unspecified: Secondary | ICD-10-CM | POA: Diagnosis not present

## 2023-08-28 DIAGNOSIS — I700 Atherosclerosis of aorta: Secondary | ICD-10-CM | POA: Diagnosis present

## 2023-08-28 DIAGNOSIS — I080 Rheumatic disorders of both mitral and aortic valves: Secondary | ICD-10-CM | POA: Diagnosis present

## 2023-08-28 DIAGNOSIS — D62 Acute posthemorrhagic anemia: Secondary | ICD-10-CM | POA: Diagnosis not present

## 2023-08-28 DIAGNOSIS — I2510 Atherosclerotic heart disease of native coronary artery without angina pectoris: Secondary | ICD-10-CM

## 2023-08-28 DIAGNOSIS — T502X5A Adverse effect of carbonic-anhydrase inhibitors, benzothiadiazides and other diuretics, initial encounter: Secondary | ICD-10-CM | POA: Diagnosis present

## 2023-08-28 DIAGNOSIS — Z7902 Long term (current) use of antithrombotics/antiplatelets: Secondary | ICD-10-CM

## 2023-08-28 DIAGNOSIS — Z01810 Encounter for preprocedural cardiovascular examination: Secondary | ICD-10-CM | POA: Diagnosis not present

## 2023-08-28 DIAGNOSIS — E876 Hypokalemia: Secondary | ICD-10-CM | POA: Diagnosis not present

## 2023-08-28 DIAGNOSIS — Z7985 Long-term (current) use of injectable non-insulin antidiabetic drugs: Secondary | ICD-10-CM

## 2023-08-28 DIAGNOSIS — I5023 Acute on chronic systolic (congestive) heart failure: Secondary | ICD-10-CM | POA: Diagnosis present

## 2023-08-28 DIAGNOSIS — K219 Gastro-esophageal reflux disease without esophagitis: Secondary | ICD-10-CM | POA: Diagnosis present

## 2023-08-28 DIAGNOSIS — Z79899 Other long term (current) drug therapy: Secondary | ICD-10-CM

## 2023-08-28 DIAGNOSIS — Z6839 Body mass index (BMI) 39.0-39.9, adult: Secondary | ICD-10-CM

## 2023-08-28 HISTORY — PX: RIGHT/LEFT HEART CATH AND CORONARY ANGIOGRAPHY: CATH118266

## 2023-08-28 HISTORY — PX: CENTRAL LINE INSERTION: CATH118232

## 2023-08-28 LAB — POCT I-STAT EG7
Acid-Base Excess: 0 mmol/L (ref 0.0–2.0)
Acid-Base Excess: 0 mmol/L (ref 0.0–2.0)
Acid-base deficit: 2 mmol/L (ref 0.0–2.0)
Bicarbonate: 23.1 mmol/L (ref 20.0–28.0)
Bicarbonate: 24.6 mmol/L (ref 20.0–28.0)
Bicarbonate: 25 mmol/L (ref 20.0–28.0)
Calcium, Ion: 1.14 mmol/L — ABNORMAL LOW (ref 1.15–1.40)
Calcium, Ion: 1.15 mmol/L (ref 1.15–1.40)
Calcium, Ion: 1.18 mmol/L (ref 1.15–1.40)
HCT: 31 % — ABNORMAL LOW (ref 36.0–46.0)
HCT: 33 % — ABNORMAL LOW (ref 36.0–46.0)
HCT: 34 % — ABNORMAL LOW (ref 36.0–46.0)
Hemoglobin: 10.5 g/dL — ABNORMAL LOW (ref 12.0–15.0)
Hemoglobin: 11.2 g/dL — ABNORMAL LOW (ref 12.0–15.0)
Hemoglobin: 11.6 g/dL — ABNORMAL LOW (ref 12.0–15.0)
O2 Saturation: 41 %
O2 Saturation: 44 %
O2 Saturation: 89 %
Potassium: 3.8 mmol/L (ref 3.5–5.1)
Potassium: 3.8 mmol/L (ref 3.5–5.1)
Potassium: 4 mmol/L (ref 3.5–5.1)
Sodium: 138 mmol/L (ref 135–145)
Sodium: 139 mmol/L (ref 135–145)
Sodium: 140 mmol/L (ref 135–145)
TCO2: 24 mmol/L (ref 22–32)
TCO2: 26 mmol/L (ref 22–32)
TCO2: 26 mmol/L (ref 22–32)
pCO2, Ven: 38.6 mm[Hg] — ABNORMAL LOW (ref 44–60)
pCO2, Ven: 40.9 mm[Hg] — ABNORMAL LOW (ref 44–60)
pCO2, Ven: 41.2 mm[Hg] — ABNORMAL LOW (ref 44–60)
pH, Ven: 7.385 (ref 7.25–7.43)
pH, Ven: 7.385 (ref 7.25–7.43)
pH, Ven: 7.394 (ref 7.25–7.43)
pO2, Ven: 23 mm[Hg] — CL (ref 32–45)
pO2, Ven: 25 mm[Hg] — CL (ref 32–45)
pO2, Ven: 57 mm[Hg] — ABNORMAL HIGH (ref 32–45)

## 2023-08-28 LAB — COMPREHENSIVE METABOLIC PANEL
ALT: 21 U/L (ref 0–44)
AST: 17 U/L (ref 15–41)
Albumin: 3.6 g/dL (ref 3.5–5.0)
Alkaline Phosphatase: 37 U/L — ABNORMAL LOW (ref 38–126)
Anion gap: 12 (ref 5–15)
BUN: 15 mg/dL (ref 8–23)
CO2: 24 mmol/L (ref 22–32)
Calcium: 9 mg/dL (ref 8.9–10.3)
Chloride: 101 mmol/L (ref 98–111)
Creatinine, Ser: 0.86 mg/dL (ref 0.44–1.00)
GFR, Estimated: >60 mL/min (ref >60)
Glucose, Bld: 170 mg/dL — ABNORMAL HIGH (ref 70–99)
Potassium: 4.3 mmol/L (ref 3.5–5.1)
Sodium: 137 mmol/L (ref 135–145)
Total Bilirubin: 0.5 mg/dL (ref <1.2)
Total Protein: 7 g/dL (ref 6.5–8.1)

## 2023-08-28 LAB — CBC
HCT: 33.8 % — ABNORMAL LOW (ref 36.0–46.0)
Hemoglobin: 10.7 g/dL — ABNORMAL LOW (ref 12.0–15.0)
MCH: 25.2 pg — ABNORMAL LOW (ref 26.0–34.0)
MCHC: 31.7 g/dL (ref 30.0–36.0)
MCV: 79.7 fL — ABNORMAL LOW (ref 80.0–100.0)
Platelets: 351 10*3/uL (ref 150–400)
RBC: 4.24 MIL/uL (ref 3.87–5.11)
RDW: 16 % — ABNORMAL HIGH (ref 11.5–15.5)
WBC: 7.8 10*3/uL (ref 4.0–10.5)
nRBC: 0 % (ref 0.0–0.2)

## 2023-08-28 LAB — MRSA NEXT GEN BY PCR, NASAL: MRSA by PCR Next Gen: NOT DETECTED

## 2023-08-28 LAB — GLUCOSE, CAPILLARY
Glucose-Capillary: 178 mg/dL — ABNORMAL HIGH (ref 70–99)
Glucose-Capillary: 180 mg/dL — ABNORMAL HIGH (ref 70–99)
Glucose-Capillary: 237 mg/dL — ABNORMAL HIGH (ref 70–99)

## 2023-08-28 SURGERY — RIGHT/LEFT HEART CATH AND CORONARY ANGIOGRAPHY
Anesthesia: LOCAL

## 2023-08-28 MED ORDER — GABAPENTIN 100 MG PO CAPS
100.0000 mg | ORAL_CAPSULE | Freq: Every day | ORAL | Status: DC
  Administered 2023-08-28 – 2023-09-20 (×24): 100 mg via ORAL
  Filled 2023-08-28 (×24): qty 1

## 2023-08-28 MED ORDER — FUROSEMIDE 10 MG/ML IJ SOLN
INTRAMUSCULAR | Status: AC
  Administered 2023-08-28: 40 mg via INTRAVENOUS
  Filled 2023-08-28: qty 4

## 2023-08-28 MED ORDER — LIDOCAINE HCL (PF) 1 % IJ SOLN
INTRAMUSCULAR | Status: AC
  Filled 2023-08-28: qty 30

## 2023-08-28 MED ORDER — FUROSEMIDE 10 MG/ML IJ SOLN
40.0000 mg | Freq: Two times a day (BID) | INTRAMUSCULAR | Status: DC
  Administered 2023-08-29 – 2023-08-30 (×4): 40 mg via INTRAVENOUS
  Filled 2023-08-28 (×4): qty 4

## 2023-08-28 MED ORDER — GABAPENTIN 100 MG PO CAPS: 100.0000 mg | ORAL_CAPSULE | Freq: Every evening | ORAL | Status: DC | PRN

## 2023-08-28 MED ORDER — SODIUM CHLORIDE 0.9 % IV SOLN: 250.0000 mL | INTRAVENOUS | Status: AC | PRN

## 2023-08-28 MED ORDER — ACETAMINOPHEN 325 MG PO TABS
650.0000 mg | ORAL_TABLET | ORAL | Status: DC | PRN
  Administered 2023-08-30: 650 mg via ORAL
  Filled 2023-08-28: qty 2

## 2023-08-28 MED ORDER — ISOSORBIDE DINITRATE 30 MG PO TABS
30.0000 mg | ORAL_TABLET | Freq: Three times a day (TID) | ORAL | Status: DC
  Administered 2023-08-28 – 2023-08-30 (×5): 30 mg via ORAL
  Filled 2023-08-28 (×6): qty 1

## 2023-08-28 MED ORDER — LORATADINE 10 MG PO TABS
10.0000 mg | ORAL_TABLET | Freq: Every day | ORAL | Status: DC
  Administered 2023-08-28 – 2023-09-21 (×23): 10 mg via ORAL
  Filled 2023-08-28 (×24): qty 1

## 2023-08-28 MED ORDER — LIDOCAINE HCL (PF) 1 % IJ SOLN
INTRAMUSCULAR | Status: DC | PRN
  Administered 2023-08-28 (×3): 2 mL

## 2023-08-28 MED ORDER — MILRINONE LACTATE IN DEXTROSE 20-5 MG/100ML-% IV SOLN
0.1250 ug/kg/min | INTRAVENOUS | Status: DC
  Administered 2023-08-28 – 2023-08-29 (×2): 0.125 ug/kg/min via INTRAVENOUS
  Filled 2023-08-28 (×2): qty 100

## 2023-08-28 MED ORDER — ASPIRIN 81 MG PO CHEW: 81.0000 mg | CHEWABLE_TABLET | ORAL | Status: DC

## 2023-08-28 MED ORDER — FENTANYL CITRATE (PF) 100 MCG/2ML IJ SOLN
INTRAMUSCULAR | Status: AC
  Filled 2023-08-28: qty 2

## 2023-08-28 MED ORDER — CHLORHEXIDINE GLUCONATE CLOTH 2 % EX PADS
6.0000 | MEDICATED_PAD | Freq: Every day | CUTANEOUS | Status: DC
  Administered 2023-08-28 – 2023-09-06 (×7): 6 via TOPICAL

## 2023-08-28 MED ORDER — PANTOPRAZOLE SODIUM 40 MG PO TBEC
40.0000 mg | DELAYED_RELEASE_TABLET | Freq: Every day | ORAL | Status: DC
  Administered 2023-08-29 – 2023-09-05 (×8): 40 mg via ORAL
  Filled 2023-08-28 (×8): qty 1

## 2023-08-28 MED ORDER — SODIUM CHLORIDE 0.9% FLUSH: 10.0000 mL | Freq: Two times a day (BID) | INTRAVENOUS | Status: DC

## 2023-08-28 MED ORDER — SODIUM CHLORIDE 0.9% FLUSH
3.0000 mL | Freq: Two times a day (BID) | INTRAVENOUS | Status: DC
  Administered 2023-08-28 – 2023-09-05 (×13): 3 mL via INTRAVENOUS

## 2023-08-28 MED ORDER — ENOXAPARIN SODIUM 40 MG/0.4ML IJ SOSY
40.0000 mg | PREFILLED_SYRINGE | INTRAMUSCULAR | Status: DC
  Administered 2023-08-29 – 2023-08-30 (×2): 40 mg via SUBCUTANEOUS
  Filled 2023-08-28 (×2): qty 0.4

## 2023-08-28 MED ORDER — NITROGLYCERIN 0.4 MG SL SUBL: 0.4000 mg | SUBLINGUAL_TABLET | SUBLINGUAL | Status: DC | PRN

## 2023-08-28 MED ORDER — HEPARIN SODIUM (PORCINE) 1000 UNIT/ML IJ SOLN
INTRAMUSCULAR | Status: DC | PRN
  Administered 2023-08-28: 5000 [IU] via INTRAVENOUS

## 2023-08-28 MED ORDER — VERAPAMIL HCL 2.5 MG/ML IV SOLN
INTRAVENOUS | Status: DC | PRN
  Administered 2023-08-28: 10 mL via INTRA_ARTERIAL

## 2023-08-28 MED ORDER — VERAPAMIL HCL 2.5 MG/ML IV SOLN
INTRAVENOUS | Status: AC
Start: 2023-08-28 — End: ?
  Filled 2023-08-28: qty 2

## 2023-08-28 MED ORDER — SODIUM CHLORIDE 0.9% FLUSH: 10.0000 mL | INTRAVENOUS | Status: DC | PRN

## 2023-08-28 MED ORDER — SODIUM CHLORIDE 0.9% FLUSH
10.0000 mL | Freq: Two times a day (BID) | INTRAVENOUS | Status: DC
  Administered 2023-08-28 – 2023-09-04 (×11): 10 mL
  Administered 2023-09-05: 20 mL
  Administered 2023-09-05: 10 mL

## 2023-08-28 MED ORDER — MIDAZOLAM HCL 2 MG/2ML IJ SOLN
INTRAMUSCULAR | Status: DC | PRN
  Administered 2023-08-28: 1 mg via INTRAVENOUS
  Administered 2023-08-28: 2 mg via INTRAVENOUS

## 2023-08-28 MED ORDER — HYDRALAZINE HCL 20 MG/ML IJ SOLN: 10.0000 mg | INTRAMUSCULAR | Status: AC | PRN

## 2023-08-28 MED ORDER — FENTANYL CITRATE (PF) 100 MCG/2ML IJ SOLN
INTRAMUSCULAR | Status: DC | PRN
  Administered 2023-08-28 (×2): 25 ug via INTRAVENOUS

## 2023-08-28 MED ORDER — ONDANSETRON HCL 4 MG/2ML IJ SOLN
4.0000 mg | Freq: Four times a day (QID) | INTRAMUSCULAR | Status: DC | PRN
Start: 2023-08-28 — End: 2023-09-06

## 2023-08-28 MED ORDER — INSULIN ASPART 100 UNIT/ML IJ SOLN
0.0000 [IU] | Freq: Every day | INTRAMUSCULAR | Status: DC
  Administered 2023-08-28: 2 [IU] via SUBCUTANEOUS
  Administered 2023-08-29 – 2023-08-30 (×2): 3 [IU] via SUBCUTANEOUS
  Administered 2023-08-31: 4 [IU] via SUBCUTANEOUS
  Administered 2023-09-01: 5 [IU] via SUBCUTANEOUS
  Administered 2023-09-02: 4 [IU] via SUBCUTANEOUS
  Administered 2023-09-03 – 2023-09-04 (×2): 3 [IU] via SUBCUTANEOUS

## 2023-08-28 MED ORDER — MIDAZOLAM HCL 2 MG/2ML IJ SOLN
INTRAMUSCULAR | Status: AC
Start: 2023-08-28 — End: ?
  Filled 2023-08-28: qty 2

## 2023-08-28 MED ORDER — CHLORHEXIDINE GLUCONATE CLOTH 2 % EX PADS
6.0000 | MEDICATED_PAD | Freq: Every day | CUTANEOUS | Status: DC
  Administered 2023-08-28 – 2023-09-03 (×4): 6 via TOPICAL

## 2023-08-28 MED ORDER — ROSUVASTATIN CALCIUM 20 MG PO TABS
40.0000 mg | ORAL_TABLET | Freq: Every day | ORAL | Status: DC
  Administered 2023-08-28 – 2023-09-21 (×24): 40 mg via ORAL
  Filled 2023-08-28 (×24): qty 2

## 2023-08-28 MED ORDER — SODIUM CHLORIDE 0.9% FLUSH
3.0000 mL | INTRAVENOUS | Status: DC | PRN
Start: 2023-08-28 — End: 2023-09-06

## 2023-08-28 MED ORDER — MIDAZOLAM HCL 2 MG/2ML IJ SOLN
INTRAMUSCULAR | Status: AC
  Filled 2023-08-28: qty 2

## 2023-08-28 MED ORDER — LABETALOL HCL 5 MG/ML IV SOLN: 10.0000 mg | INTRAVENOUS | Status: AC | PRN

## 2023-08-28 MED ORDER — EZETIMIBE 10 MG PO TABS
10.0000 mg | ORAL_TABLET | Freq: Every day | ORAL | Status: DC
  Administered 2023-08-29 – 2023-09-21 (×23): 10 mg via ORAL
  Filled 2023-08-28 (×23): qty 1

## 2023-08-28 MED ORDER — CLOPIDOGREL BISULFATE 75 MG PO TABS: 75.0000 mg | ORAL_TABLET | ORAL | Status: DC

## 2023-08-28 MED ORDER — INSULIN ASPART 100 UNIT/ML IJ SOLN
0.0000 [IU] | Freq: Three times a day (TID) | INTRAMUSCULAR | Status: DC
  Administered 2023-08-29: 5 [IU] via SUBCUTANEOUS
  Administered 2023-08-29: 3 [IU] via SUBCUTANEOUS
  Administered 2023-08-29: 8 [IU] via SUBCUTANEOUS
  Administered 2023-08-30: 11 [IU] via SUBCUTANEOUS
  Administered 2023-08-30: 5 [IU] via SUBCUTANEOUS
  Administered 2023-08-30: 11 [IU] via SUBCUTANEOUS
  Administered 2023-08-31: 8 [IU] via SUBCUTANEOUS
  Administered 2023-08-31: 15 [IU] via SUBCUTANEOUS
  Administered 2023-09-01 (×2): 8 [IU] via SUBCUTANEOUS
  Administered 2023-09-01: 11 [IU] via SUBCUTANEOUS
  Administered 2023-09-02 (×2): 8 [IU] via SUBCUTANEOUS
  Administered 2023-09-02 – 2023-09-03 (×2): 11 [IU] via SUBCUTANEOUS
  Administered 2023-09-03 – 2023-09-05 (×6): 8 [IU] via SUBCUTANEOUS

## 2023-08-28 MED ORDER — CARVEDILOL 6.25 MG PO TABS: 6.2500 mg | ORAL_TABLET | Freq: Two times a day (BID) | ORAL | Status: DC

## 2023-08-28 SURGICAL SUPPLY — 12 items
CATH 5FR JL3.5 JR4 ANG PIG MP (CATHETERS) ×2
CATH BALLN WEDGE 5F 110CM (CATHETERS) ×2
DEVICE RAD COMP TR BAND LRG (VASCULAR PRODUCTS) ×2
GLIDESHEATH SLEND SS 6F .021 (SHEATH) ×2
INQWIRE 1.5J .035X260CM (WIRE) ×2
KIT CV 3L 7FR 20CM SULFAFREE (SET/KITS/TRAYS/PACK) ×2
KIT MICROPUNCTURE NIT STIFF (SHEATH) ×2
PACK CARDIAC CATHETERIZATION (CUSTOM PROCEDURE TRAY) ×2
SET ATX-X65L (MISCELLANEOUS) ×2
SHEATH GLIDE SLENDER 4/5FR (SHEATH) ×2
SHEATH PROBE COVER 6X72 (BAG) ×2
WIRE EMERALD 3MM-J .025X260CM (WIRE) ×2

## 2023-08-28 NOTE — H&P (View-Only) (Signed)
[address]       [place] [address]             [phone]        Diana [name] [facility] Medical Record #[id number] Date of Birth: 1946/03/23  Referring: No ref. provider found Primary Care: [name], NP Primary Cardiologist:Thomas Tresa Endo, MD  Chief Complaint:   No chief complaint on file.   History of Present Illness:     Diana [name] is a 77 year old female with a past medical history of CAD (hx of PTCA stent and tandem stent to RCA 2011), HTN, HLD, and T2DM. She recently presented to her cardiologist due to chest discomfort with exertion. Over the past year she has experienced worsening chest discomfort and diaphoresis with exertion that is relieved with sublingual nitroglycerin. She also admits to worsening shortness of breath over the past few weeks with exertion and left tooth pain that accompanies the chest discomfort. She denies LOC. July 26, 2023 she was recommended to undergo cardiac catheterization but she deferred this due to her husbands knee surgery. She instead underwent a nuclear medicine PET/CT cardiac perfusion study that was markedly abnormal and noted an EF of 20%, findings were consistent with infarction and peri-infarction ischemia in the RCA distribution. Cardiac catheterization on 11/11 showed severe multivessel CAD with total occlusion of the RCA, severe stenosis of the left main, subtotal occlusion of the ostial circumflex and severe stenosis of the mid LAD and diagonal 1 as well as severe pulmonary hypertension and severe mitral regurgitation. Echocardiogram on 11/07 showed LVEF 25-30% with suspected ischemic severe mitral regurgitation and posterior leaflet teathering, no other valvular abnormalities were noted. Moderate elevation in pulmonary artery systolic pressures were again noted.  The patient currently denies chest pain.    Current Activity/ Functional Status: Patient is independent with mobility/ambulation, transfers,  ADL's, IADL's.   Zubrod Score: At the time of surgery this patient's most appropriate activity status/level should be described as: []     0    Normal activity, no symptoms [x]     1    Restricted in physical strenuous activity but ambulatory, able to do out light work []     2    Ambulatory and capable of self care, unable to do work activities, up and about                 more than 50%  Of the time                            []     3    Only limited self care, in bed greater than 50% of waking hours []     4    Completely disabled, no self care, confined to bed or chair []     5    Moribund  Past Medical History:  Diagnosis Date   CAD (coronary artery disease) 7/12-8/12   staged LAD/RCA DES   Diabetes mellitus (HCC)    Diastolic dysfunction 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    Dyslipidemia    HTN (hypertension)     Past Surgical History:  Procedure Laterality Date   PERCUTANEOUS CORONARY STENT INTERVENTION (PCI-S)  05/14/10   LAD   PERCUTANEOUS CORONARY STENT INTERVENTION (PCI-S)  05/17/10   RCA    Social History   Tobacco Use  Smoking Status Never  Smokeless Tobacco Never    Social History   Substance and  Sexual Activity  Alcohol Use Yes   Comment: Occasionally-very rare     Allergies  Allergen Reactions   Lisinopril Cough   Sulfa Antibiotics Hives    Current Facility-Administered Medications  Medication Dose Route Frequency Provider Last Rate Last Admin   Radial Cocktail/Verapamil only    PRN Tonny Bollman, MD   10 mL at 08/28/23 1328    Medications Prior to Admission  Medication Sig Dispense Refill Last Dose   aspirin 81 MG tablet Take 243 mg by mouth daily.   08/28/2023 at 0430   BD INSULIN SYRINGE U/F 1/2UNIT 31G X 5/16" 0.3 ML MISC    08/27/2023   Beta Carotene (VITAMIN A) 25000 UNIT capsule Take 25,000 Units by mouth daily.   Past Week   Biotin 5000 MCG CAPS Take 1 capsule by mouth daily.   Past Week   carvedilol (COREG) 25 MG tablet Take 25 mg by mouth 2 (two) times  daily with a meal.   08/28/2023   clopidogrel (PLAVIX) 75 MG tablet TAKE 1 TABLET BY MOUTH EVERY DAY 90 tablet 3 08/28/2023 at 0430   cyanocobalamin (VITAMIN B12) 1000 MCG tablet Take 2,000 mcg by mouth daily.   08/27/2023   ezetimibe (ZETIA) 10 MG tablet TAKE 1 TABLET BY MOUTH EVERY DAY 90 tablet 2 08/28/2023   fish oil-omega-3 fatty acids 1000 MG capsule Take 1 g by mouth daily.   08/27/2023   gabapentin (NEURONTIN) 100 MG capsule Take 100 mg by mouth See admin instructions. Take 100 mg at bedtime, may take an additional 100 mg twice daily as needed for pain   08/27/2023   hydrochlorothiazide (HYDRODIURIL) 25 MG tablet Take 25 mg by mouth daily.   08/27/2023   insulin NPH-regular Human (NOVOLIN 70/30) (70-30) 100 UNIT/ML injection Inject 60 Units into the skin 2 (two) times daily.      isosorbide dinitrate (ISORDIL) 30 MG tablet Take 1.5 tablets (45 mg total) by mouth 4 (four) times daily. 135 tablet 2 08/28/2023   loratadine (CLARITIN) 10 MG tablet Take 10 mg by mouth daily.   08/27/2023   losartan-hydrochlorothiazide (HYZAAR) 50-12.5 MG tablet TAKE 1 TABLET BY MOUTH EVERY DAY. NEED APPT FOR REFILLS 90 tablet 3 08/27/2023   metFORMIN (GLUCOPHAGE) 1000 MG tablet Take 1,000 mg by mouth 2 (two) times daily with a meal.   Past Week   naproxen sodium (ALEVE) 220 MG tablet Take 220 mg by mouth at bedtime as needed (pain).   Past Week   nitroGLYCERIN (NITROSTAT) 0.4 MG SL tablet Place 1 tablet (0.4 mg total) under the tongue every 5 (five) minutes as needed for chest pain. 25 tablet 3 08/26/2023   Oyster Shell (OYSTER CALCIUM) 500 MG TABS tablet Take 500 mg of elemental calcium by mouth 2 (two) times a week.   Past Week   pantoprazole (PROTONIX) 40 MG tablet Take 1 tablet by mouth daily. Reported on 03/24/2016   08/28/2023   rosuvastatin (CRESTOR) 10 MG tablet TAKE 1 TABLET BY MOUTH ONE TIME PER WEEK (Patient taking differently: Take 10 mg by mouth every Friday.) 12 tablet 3 Past Week   Semaglutide,0.25 or  0.5MG /DOS, (OZEMPIC, 0.25 OR 0.5 MG/DOSE,) 2 MG/3ML SOPN Inject 0.5 mg into the skin once a week. 3 mL 2 Past Month    Family History  Problem Relation Age of Onset   CAD Neg Hx      Review of Systems:   Review of Systems  Constitutional:  Positive for diaphoresis and malaise/fatigue. Negative for chills and  fever.  HENT:  Negative for hearing loss.   Eyes:  Negative for blurred vision.  Respiratory:  Positive for shortness of breath. Negative for cough and wheezing.   Cardiovascular:  Positive for chest pain and leg swelling. Negative for palpitations and orthopnea.  Gastrointestinal:  Negative for heartburn, nausea and vomiting.  Genitourinary:  Negative for dysuria.  Musculoskeletal:  Negative for myalgias.  Neurological:  Positive for loss of consciousness and weakness. Negative for dizziness.  Endo/Heme/Allergies:  Bruises/bleeds easily.  Psychiatric/Behavioral:  Negative for depression. The patient is not nervous/anxious.     Physical Exam: BP 127/77   Pulse 82   Temp 98.1 F (36.7 C) (Oral)   Resp (!) 23   Ht 5\' 4"  (1.626 m)   Wt 97.1 kg   SpO2 94%   BMI 36.73 kg/m   General appearance: alert, cooperative, and no distress Head: Normocephalic, without obvious abnormality, atraumatic Neck: no adenopathy, no carotid bruit, no JVD, supple, symmetrical, trachea midline, and thyroid not enlarged, symmetric, no tenderness/mass/nodules Lymph nodes: Cervical, supraclavicular, and axillary nodes normal. Resp: clear to auscultation bilaterally Cardio: regular rate and rhythm, S1, S2 normal, no murmur, click, rub or gallop GI: soft, non-tender; bowel sounds normal; no masses,  no organomegaly Extremities: edema 2+, 2+ DP/PT pulses Neurologic: Grossly normal  Diagnostic Studies & Radiology Findings:  RIGHT/LEFT HEART CATH AND CORONARY ANGIOGRAPHY   1.  Severe multivessel coronary artery disease with total occlusion of the RCA, severe stenosis of the distal left main,  subtotal occlusion of the ostial circumflex, and severe stenosis of both the mid LAD and first diagonal at the bifurcation point 2.  Known severe MR by echo assessment confirmed by invasive hemodynamics with giant V waves of greater than 60 mmHg 3.  Severe pulmonary hypertension with PA pressure 83/37 mmHg with a mean of 56 mmHg, transpulmonary gradient of 20 mmHg and PVR of approximately 5 Wood units  ECHOCARDIOGRAM REPORT   Patient Name:   Diana [name] Date of Exam: 08/24/2023  Medical Rec #:  [id number]       Height:       64.0 in  Accession #:    [id number]      Weight:       214.6 lb  Date of Birth:  10/25/1945       BSA:          2.016 m  Patient Age:    76 years        BP:           128/70 mmHg  Patient Gender: F               HR:           89 bpm.  Exam Location:  [address]   Procedure: 2D Echo, Cardiac Doppler and Color Doppler   Indications:    I25.5 Ischemic Cardiomyopathy    History:        Patient has prior history of Echocardiogram examinations.  CAD,                 Abnormal ECG, Signs/Symptoms:Shortness of Breath, Chest  Pain,                 Dizziness/Lightheadedness, Fatigue and Edema; Risk                  Factors:Hypertension, Diabetes and Dyslipidemia.    Sonographer:    Farrel Conners RDCS  Referring Phys: Lennette Bihari  Sonographer Comments: Consulted with DOD Dr. Excell Seltzer (who consulted, by  phone with Dr. Tresa Endo). Dr. Excell Seltzer Evaluated Patient, stable at time of  Discharge. Will return for scheduled CATH Monday.  IMPRESSIONS   1. Left ventricular ejection fraction, by estimation, is 25 to 30%. The  left ventricle has severely decreased function. The left ventricle  demonstrates global hypokinesis. The left ventricular internal cavity size  was mildly dilated. Left ventricular  diastolic parameters are indeterminate. There is severe akinesis of the  left ventricular, entire inferior wall.   2. Right ventricular systolic function is low normal. The  right  ventricular size is normal. There is moderately elevated pulmonary artery  systolic pressure. The estimated right ventricular systolic pressure is  59.3 mmHg.   3. Left atrial size was moderately dilated.   4. Suspect ischemic MR with posterior leaflet tethering. The mitral valve  is abnormal. Severe mitral valve regurgitation.   5. The aortic valve is tricuspid. Aortic valve regurgitation is trivial.  Aortic valve sclerosis/calcification is present, without any evidence of  aortic stenosis.   6. The inferior vena cava is dilated in size with >50% respiratory  variability, suggesting right atrial pressure of 8 mmHg.   Comparison(s): No prior Echocardiogram.   FINDINGS   Left Ventricle: Left ventricular ejection fraction, by estimation, is 25  to 30%. The left ventricle has severely decreased function. The left  ventricle demonstrates global hypokinesis. Severe akinesis of the left  ventricular, entire inferior wall. The  left ventricular internal cavity size was mildly dilated. There is no left  ventricular hypertrophy. Left ventricular diastolic function could not be  evaluated due to atrial fibrillation. Left ventricular diastolic  parameters are indeterminate.   Right Ventricle: The right ventricular size is normal. No increase in  right ventricular wall thickness. Right ventricular systolic function is  low normal. There is moderately elevated pulmonary artery systolic  pressure. The tricuspid regurgitant velocity   is 3.58 m/s, and with an assumed right atrial pressure of 8 mmHg, the  estimated right ventricular systolic pressure is 59.3 mmHg.   Left Atrium: Left atrial size was moderately dilated.   Right Atrium: Right atrial size was normal in size.   Pericardium: There is no evidence of pericardial effusion.   Mitral Valve: Suspect ischemic MR with posterior leaflet tethering. The  mitral valve is abnormal. There is mild calcification of the anterior and   posterior mitral valve leaflet(s). Mild to moderate mitral annular  calcification. Severe mitral valve  regurgitation, with eccentric posteriorly directed jet. Pulmonary venous  flow shows systolic flow reversal.   Tricuspid Valve: The tricuspid valve is grossly normal. Tricuspid valve  regurgitation is trivial.   Aortic Valve: The aortic valve is tricuspid. Aortic valve regurgitation is  trivial. Aortic regurgitation PHT measures 230 msec. Aortic valve  sclerosis/calcification is present, without any evidence of aortic  stenosis.   Pulmonic Valve: The pulmonic valve was grossly normal. Pulmonic valve  regurgitation is trivial.   Aorta: The aortic root and ascending aorta are structurally normal, with  no evidence of dilitation.   Venous: The inferior vena cava is dilated in size with greater than 50%  respiratory variability, suggesting right atrial pressure of 8 mmHg.   IAS/Shunts: No atrial level shunt detected by color flow Doppler.     LEFT VENTRICLE  PLAX 2D  LVIDd:         5.80 cm   Diastology  LVIDs:         5.07 cm  LV e' medial:    7.51 cm/s  LV PW:         0.85 cm   LV E/e' medial:  18.2  LV IVS:        0.95 cm   LV e' lateral:   7.83 cm/s  LVOT diam:     2.10 cm   LV E/e' lateral: 17.5  LV SV:         36  LV SV Index:   18  LVOT Area:     3.46 cm     RIGHT VENTRICLE  RV Basal diam:  3.50 cm  RV S prime:     10.80 cm/s  TAPSE (M-mode): 1.0 cm  RVSP:           54.3 mmHg   LEFT ATRIUM             Index        RIGHT ATRIUM           Index  LA diam:        5.30 cm 2.63 cm/m   RA Pressure: 3.00 mmHg  LA Vol (A2C):   98.8 ml 49.01 ml/m  RA Area:     20.20 cm  LA Vol (A4C):   76.1 ml 37.75 ml/m  RA Volume:   64.40 ml  31.95 ml/m  LA Biplane Vol: 89.1 ml 44.20 ml/m   AORTIC VALVE  LVOT Vmax:   83.30 cm/s  LVOT Vmean:  55.700 cm/s  LVOT VTI:    0.105 m  AI PHT:      230 msec    AORTA  Ao Root diam: 2.60 cm  Ao Asc diam:  3.20 cm   MR Peak grad:  100.8 mmHg    TRICUSPID VALVE  MR Mean grad: 58.0 mmHg     TR Peak grad:   51.3 mmHg  MR Vmax:      502.00 cm/s   TR Vmax:        358.00 cm/s  MR Vmean:     356.0 cm/s    Estimated RAP:  3.00 mmHg  MV E velocity: 137.00 cm/s  RVSP:           54.3 mmHg                                SHUNTS                              Systemic VTI:  0.10 m                              Systemic Diam: 2.10 cm   Diana Shutter MD  Electronically signed by Diana Shutter MD  Signature Date/Time: 08/24/2023/2:50:38 PM      Final      Findings are consistent with infarction with peri-infarct ischemia in the right coronary artery distribution. The study is high risk. Very poor myocardial blood flow reserve suggests multivessel coronary insufficiency (although cannot rule out failure to respond to vasodilator) and left ventricular systolic function is severely depressed.   LV perfusion is abnormal. There is evidence of ischemia. There is evidence of infarction. Defect 1: There is a medium defect with moderate reduction in uptake present in the mid to basal inferior and inferolateral location(s) that is partially reversible. There is abnormal wall motion  in the defect area. Consistent with infarction and peri-infarct ischemia. Defect 2: There is a small defect with moderate reduction in uptake present in the basal inferoseptal location(s) that is reversible. There is abnormal wall motion in the defect area. Consistent with ischemia.   Rest left ventricular function is abnormal. Rest global function is severely reduced. Rest EF: 19%. Stress left ventricular function is abnormal. Stress global function is severely reduced. Stress EF: 20%. End diastolic cavity size is severely enlarged. End systolic cavity size is severely enlarged.   Myocardial blood flow was computed to be 0.83ml/g/min at rest and 0.33ml/g/min at stress. Global myocardial blood flow reserve was 0.93 and was highly abnormal.   Coronary calcium assessment not  performed due to prior revascularization.   Electronically signed by Thurmon Fair, MD.   CLINICAL DATA:  This over-read does not include interpretation of cardiac or coronary anatomy or pathology. No interpretation the PET data set. The cardiac PET-CT interpretation by the cardiologist is attached.   COMPARISON:  None Available.   FINDINGS: Limited view of the lung parenchyma demonstrates trace RIGHT pleural effusion. Airways are normal.   Limited view of the mediastinum demonstrates no adenopathy. Esophagus normal.   Limited view of the upper abdomen unremarkable.   Limited view of the skeleton and chest wall is unremarkable.   IMPRESSION: Trace RIGHT pleural effusion.     Electronically Signed   By: Genevive Bi M.D.   On: 08/15/2023 10:05  Assessment & Plan: CAD: Severe multivessel CAD, patient would benefit from CABG surgery. Would be at higher risk due to HFrEF. Patient will require Plavix washout if she is a candidate for surgery. Dr. Dorris Fetch to determine candidacy and timing of surgery.  Mitral valve regurgitation: Severe, likely ischemic. Will likely under go MVR if patient is a surgical candidate. Dr. Dorris Fetch to determine candidacy and timing of surgery.  HFrEF: Heart failure optimization prior to surgery HLD HTN T2DM Ischemic cardiomyopathy Pulmonary hypertension   Jenny Reichmann, PA-C 08/28/23  I have seen and examined Mrs. Raynor.  I reviewed her records, catheterization images, and echocardiogram images.  She is a 77 year old woman with a history of hypertension, hyperlipidemia, obesity, type 2 diabetes, coronary disease, prior staged DES to LAD and RCA, acute on chronic systolic and diastolic heart failure, severe mitral regurgitation, and pulmonary hypertension.  Presented with exertional dyspnea and chest pain.  Echo showed ejection fraction of 25 to 30% with severe MR with posterior leaflet tethering.  Cardiac catheterization showed severe  three-vessel coronary disease with chronic total occlusion of the RCA stenosis of the distal left main and subtotal occlusion of the ostial circumflex.  Right heart catheterization showed PA pressure of 83/37, PVR of 5 Wood units, cardiac index 2.04.  Coronary bypass grafting and mitral valve repair/replacement is indicated for survival benefit and relief of symptoms.  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  In that situation there is often a better result with replacement in the wrist with repair.  Final decision on that would be intraoperative, but TEE would be helpful in planning.  Dr. Gala Romney is planning to do one on Thursday.  I discussed the proposed operative procedure with Mrs. Thornton.  I informed her with the general nature of the procedure including the need for general anesthesia, the incisions to be used, the use of cardiopulmonary bypass, the use of temporary pacemaker wires and drainage tubes postoperatively, the expected hospital stay, and the overall recovery.  I  informed her of the indications, risk, benefits, and alternatives.  She understands the risks include, but are not limited to death, MI, stroke, DVT, PE, bleeding, possible need for transfusion, infection, heart block requiring pacemaker, cardiac arrhythmias, respiratory or renal failure, as well as possibility of other unforeseeable complications.  She is currently being treated medically by the advanced heart failure team with a plan to optimize medical therapy prior to surgical intervention.  Salvatore Decent Dorris Fetch, MD [facility] [phone]

## 2023-08-28 NOTE — Consult Note (Cosign Needed Addendum)
[address]       [place] [address]             [phone]        Diana [name] [facility] Medical Record #[id number] Date of Birth: 1946-06-13  Referring: No ref. provider found Primary Care: [name], NP Primary Cardiologist:Thomas Tresa Endo, MD  Chief Complaint:   No chief complaint on file.   History of Present Illness:     [name] is a 77 year old female with a past medical history of CAD (hx of PTCA stent and tandem stent to RCA 2011), HTN, HLD, and T2DM. She recently presented to her cardiologist due to chest discomfort with exertion. Over the past year she has experienced worsening chest discomfort and diaphoresis with exertion that is relieved with sublingual nitroglycerin. She also admits to worsening shortness of breath over the past few weeks with exertion and left tooth pain that accompanies the chest discomfort. She denies LOC. July 26, 2023 she was recommended to undergo cardiac catheterization but she deferred this due to her husbands knee surgery. She instead underwent a nuclear medicine PET/CT cardiac perfusion study that was markedly abnormal and noted an EF of 20%, findings were consistent with infarction and peri-infarction ischemia in the RCA distribution. Cardiac catheterization on 11/11 showed severe multivessel CAD with total occlusion of the RCA, severe stenosis of the left main, subtotal occlusion of the ostial circumflex and severe stenosis of the mid LAD and diagonal 1 as well as severe pulmonary hypertension and severe mitral regurgitation. Echocardiogram on 11/07 showed LVEF 25-30% with suspected ischemic severe mitral regurgitation and posterior leaflet teathering, no other valvular abnormalities were noted. Moderate elevation in pulmonary artery systolic pressures were again noted.  The patient currently denies chest pain.    Current Activity/ Functional Status: Patient is independent with mobility/ambulation, transfers,  ADL's, IADL's.   Zubrod Score: At the time of surgery this patient's most appropriate activity status/level should be described as: []     0    Normal activity, no symptoms [x]     1    Restricted in physical strenuous activity but ambulatory, able to do out light work []     2    Ambulatory and capable of self care, unable to do work activities, up and about                 more than 50%  Of the time                            []     3    Only limited self care, in bed greater than 50% of waking hours []     4    Completely disabled, no self care, confined to bed or chair []     5    Moribund  Past Medical History:  Diagnosis Date   CAD (coronary artery disease) 7/12-8/12   staged LAD/RCA DES   Diabetes mellitus (HCC)    Diastolic dysfunction 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    Dyslipidemia    HTN (hypertension)     Past Surgical History:  Procedure Laterality Date   PERCUTANEOUS CORONARY STENT INTERVENTION (PCI-S)  05/14/10   LAD   PERCUTANEOUS CORONARY STENT INTERVENTION (PCI-S)  05/17/10   RCA    Social History   Tobacco Use  Smoking Status Never  Smokeless Tobacco Never    Social History   Substance and  Sexual Activity  Alcohol Use Yes   Comment: Occasionally-very rare     Allergies  Allergen Reactions   Lisinopril Cough   Sulfa Antibiotics Hives    Current Facility-Administered Medications  Medication Dose Route Frequency Provider Last Rate Last Admin   Radial Cocktail/Verapamil only    PRN Tonny Bollman, MD   10 mL at 08/28/23 1328    Medications Prior to Admission  Medication Sig Dispense Refill Last Dose   aspirin 81 MG tablet Take 243 mg by mouth daily.   08/28/2023 at 0430   BD INSULIN SYRINGE U/F 1/2UNIT 31G X 5/16" 0.3 ML MISC    08/27/2023   Beta Carotene (VITAMIN A) 25000 UNIT capsule Take 25,000 Units by mouth daily.   Past Week   Biotin 5000 MCG CAPS Take 1 capsule by mouth daily.   Past Week   carvedilol (COREG) 25 MG tablet Take 25 mg by mouth 2 (two) times  daily with a meal.   08/28/2023   clopidogrel (PLAVIX) 75 MG tablet TAKE 1 TABLET BY MOUTH EVERY DAY 90 tablet 3 08/28/2023 at 0430   cyanocobalamin (VITAMIN B12) 1000 MCG tablet Take 2,000 mcg by mouth daily.   08/27/2023   ezetimibe (ZETIA) 10 MG tablet TAKE 1 TABLET BY MOUTH EVERY DAY 90 tablet 2 08/28/2023   fish oil-omega-3 fatty acids 1000 MG capsule Take 1 g by mouth daily.   08/27/2023   gabapentin (NEURONTIN) 100 MG capsule Take 100 mg by mouth See admin instructions. Take 100 mg at bedtime, may take an additional 100 mg twice daily as needed for pain   08/27/2023   hydrochlorothiazide (HYDRODIURIL) 25 MG tablet Take 25 mg by mouth daily.   08/27/2023   insulin NPH-regular Human (NOVOLIN 70/30) (70-30) 100 UNIT/ML injection Inject 60 Units into the skin 2 (two) times daily.      isosorbide dinitrate (ISORDIL) 30 MG tablet Take 1.5 tablets (45 mg total) by mouth 4 (four) times daily. 135 tablet 2 08/28/2023   loratadine (CLARITIN) 10 MG tablet Take 10 mg by mouth daily.   08/27/2023   losartan-hydrochlorothiazide (HYZAAR) 50-12.5 MG tablet TAKE 1 TABLET BY MOUTH EVERY DAY. NEED APPT FOR REFILLS 90 tablet 3 08/27/2023   metFORMIN (GLUCOPHAGE) 1000 MG tablet Take 1,000 mg by mouth 2 (two) times daily with a meal.   Past Week   naproxen sodium (ALEVE) 220 MG tablet Take 220 mg by mouth at bedtime as needed (pain).   Past Week   nitroGLYCERIN (NITROSTAT) 0.4 MG SL tablet Place 1 tablet (0.4 mg total) under the tongue every 5 (five) minutes as needed for chest pain. 25 tablet 3 08/26/2023   Oyster Shell (OYSTER CALCIUM) 500 MG TABS tablet Take 500 mg of elemental calcium by mouth 2 (two) times a week.   Past Week   pantoprazole (PROTONIX) 40 MG tablet Take 1 tablet by mouth daily. Reported on 03/24/2016   08/28/2023   rosuvastatin (CRESTOR) 10 MG tablet TAKE 1 TABLET BY MOUTH ONE TIME PER WEEK (Patient taking differently: Take 10 mg by mouth every Friday.) 12 tablet 3 Past Week   Semaglutide,0.25 or  0.5MG /DOS, (OZEMPIC, 0.25 OR 0.5 MG/DOSE,) 2 MG/3ML SOPN Inject 0.5 mg into the skin once a week. 3 mL 2 Past Month    Family History  Problem Relation Age of Onset   CAD Neg Hx      Review of Systems:   Review of Systems  Constitutional:  Positive for diaphoresis and malaise/fatigue. Negative for chills and  fever.  HENT:  Negative for hearing loss.   Eyes:  Negative for blurred vision.  Respiratory:  Positive for shortness of breath. Negative for cough and wheezing.   Cardiovascular:  Positive for chest pain and leg swelling. Negative for palpitations and orthopnea.  Gastrointestinal:  Negative for heartburn, nausea and vomiting.  Genitourinary:  Negative for dysuria.  Musculoskeletal:  Negative for myalgias.  Neurological:  Positive for loss of consciousness and weakness. Negative for dizziness.  Endo/Heme/Allergies:  Bruises/bleeds easily.  Psychiatric/Behavioral:  Negative for depression. The patient is not nervous/anxious.     Physical Exam: BP 127/77   Pulse 82   Temp 98.1 F (36.7 C) (Oral)   Resp (!) 23   Ht 5\' 4"  (1.626 m)   Wt 97.1 kg   SpO2 94%   BMI 36.73 kg/m   General appearance: alert, cooperative, and no distress Head: Normocephalic, without obvious abnormality, atraumatic Neck: no adenopathy, no carotid bruit, no JVD, supple, symmetrical, trachea midline, and thyroid not enlarged, symmetric, no tenderness/mass/nodules Lymph nodes: Cervical, supraclavicular, and axillary nodes normal. Resp: clear to auscultation bilaterally Cardio: regular rate and rhythm, S1, S2 normal, no murmur, click, rub or gallop GI: soft, non-tender; bowel sounds normal; no masses,  no organomegaly Extremities: edema 2+, 2+ DP/PT pulses Neurologic: Grossly normal  Diagnostic Studies & Radiology Findings:  RIGHT/LEFT HEART CATH AND CORONARY ANGIOGRAPHY   1.  Severe multivessel coronary artery disease with total occlusion of the RCA, severe stenosis of the distal left main,  subtotal occlusion of the ostial circumflex, and severe stenosis of both the mid LAD and first diagonal at the bifurcation point 2.  Known severe MR by echo assessment confirmed by invasive hemodynamics with giant V waves of greater than 60 mmHg 3.  Severe pulmonary hypertension with PA pressure 83/37 mmHg with a mean of 56 mmHg, transpulmonary gradient of 20 mmHg and PVR of approximately 5 Wood units  ECHOCARDIOGRAM REPORT   Patient Name:   AUDREY ALONZO Date of Exam: 08/24/2023  Medical Rec #:  [id number]       Height:       64.0 in  Accession #:    [id number]      Weight:       214.6 lb  Date of Birth:  10-14-1946       BSA:          2.016 m  Patient Age:    76 years        BP:           128/70 mmHg  Patient Gender: F               HR:           89 bpm.  Exam Location:  [address]   Procedure: 2D Echo, Cardiac Doppler and Color Doppler   Indications:    I25.5 Ischemic Cardiomyopathy    History:        Patient has prior history of Echocardiogram examinations.  CAD,                 Abnormal ECG, Signs/Symptoms:Shortness of Breath, Chest  Pain,                 Dizziness/Lightheadedness, Fatigue and Edema; Risk                  Factors:Hypertension, Diabetes and Dyslipidemia.    Sonographer:    Farrel Conners RDCS  Referring Phys: Lennette Bihari  Sonographer Comments: Consulted with DOD Dr. Excell Seltzer (who consulted, by  phone with Dr. Tresa Endo). Dr. Excell Seltzer Evaluated Patient, stable at time of  Discharge. Will return for scheduled CATH Monday.  IMPRESSIONS   1. Left ventricular ejection fraction, by estimation, is 25 to 30%. The  left ventricle has severely decreased function. The left ventricle  demonstrates global hypokinesis. The left ventricular internal cavity size  was mildly dilated. Left ventricular  diastolic parameters are indeterminate. There is severe akinesis of the  left ventricular, entire inferior wall.   2. Right ventricular systolic function is low normal. The  right  ventricular size is normal. There is moderately elevated pulmonary artery  systolic pressure. The estimated right ventricular systolic pressure is  59.3 mmHg.   3. Left atrial size was moderately dilated.   4. Suspect ischemic MR with posterior leaflet tethering. The mitral valve  is abnormal. Severe mitral valve regurgitation.   5. The aortic valve is tricuspid. Aortic valve regurgitation is trivial.  Aortic valve sclerosis/calcification is present, without any evidence of  aortic stenosis.   6. The inferior vena cava is dilated in size with >50% respiratory  variability, suggesting right atrial pressure of 8 mmHg.   Comparison(s): No prior Echocardiogram.   FINDINGS   Left Ventricle: Left ventricular ejection fraction, by estimation, is 25  to 30%. The left ventricle has severely decreased function. The left  ventricle demonstrates global hypokinesis. Severe akinesis of the left  ventricular, entire inferior wall. The  left ventricular internal cavity size was mildly dilated. There is no left  ventricular hypertrophy. Left ventricular diastolic function could not be  evaluated due to atrial fibrillation. Left ventricular diastolic  parameters are indeterminate.   Right Ventricle: The right ventricular size is normal. No increase in  right ventricular wall thickness. Right ventricular systolic function is  low normal. There is moderately elevated pulmonary artery systolic  pressure. The tricuspid regurgitant velocity   is 3.58 m/s, and with an assumed right atrial pressure of 8 mmHg, the  estimated right ventricular systolic pressure is 59.3 mmHg.   Left Atrium: Left atrial size was moderately dilated.   Right Atrium: Right atrial size was normal in size.   Pericardium: There is no evidence of pericardial effusion.   Mitral Valve: Suspect ischemic MR with posterior leaflet tethering. The  mitral valve is abnormal. There is mild calcification of the anterior and   posterior mitral valve leaflet(s). Mild to moderate mitral annular  calcification. Severe mitral valve  regurgitation, with eccentric posteriorly directed jet. Pulmonary venous  flow shows systolic flow reversal.   Tricuspid Valve: The tricuspid valve is grossly normal. Tricuspid valve  regurgitation is trivial.   Aortic Valve: The aortic valve is tricuspid. Aortic valve regurgitation is  trivial. Aortic regurgitation PHT measures 230 msec. Aortic valve  sclerosis/calcification is present, without any evidence of aortic  stenosis.   Pulmonic Valve: The pulmonic valve was grossly normal. Pulmonic valve  regurgitation is trivial.   Aorta: The aortic root and ascending aorta are structurally normal, with  no evidence of dilitation.   Venous: The inferior vena cava is dilated in size with greater than 50%  respiratory variability, suggesting right atrial pressure of 8 mmHg.   IAS/Shunts: No atrial level shunt detected by color flow Doppler.     LEFT VENTRICLE  PLAX 2D  LVIDd:         5.80 cm   Diastology  LVIDs:         5.07 cm  LV e' medial:    7.51 cm/s  LV PW:         0.85 cm   LV E/e' medial:  18.2  LV IVS:        0.95 cm   LV e' lateral:   7.83 cm/s  LVOT diam:     2.10 cm   LV E/e' lateral: 17.5  LV SV:         36  LV SV Index:   18  LVOT Area:     3.46 cm     RIGHT VENTRICLE  RV Basal diam:  3.50 cm  RV S prime:     10.80 cm/s  TAPSE (M-mode): 1.0 cm  RVSP:           54.3 mmHg   LEFT ATRIUM             Index        RIGHT ATRIUM           Index  LA diam:        5.30 cm 2.63 cm/m   RA Pressure: 3.00 mmHg  LA Vol (A2C):   98.8 ml 49.01 ml/m  RA Area:     20.20 cm  LA Vol (A4C):   76.1 ml 37.75 ml/m  RA Volume:   64.40 ml  31.95 ml/m  LA Biplane Vol: 89.1 ml 44.20 ml/m   AORTIC VALVE  LVOT Vmax:   83.30 cm/s  LVOT Vmean:  55.700 cm/s  LVOT VTI:    0.105 m  AI PHT:      230 msec    AORTA  Ao Root diam: 2.60 cm  Ao Asc diam:  3.20 cm   MR Peak grad:  100.8 mmHg    TRICUSPID VALVE  MR Mean grad: 58.0 mmHg     TR Peak grad:   51.3 mmHg  MR Vmax:      502.00 cm/s   TR Vmax:        358.00 cm/s  MR Vmean:     356.0 cm/s    Estimated RAP:  3.00 mmHg  MV E velocity: 137.00 cm/s  RVSP:           54.3 mmHg                                SHUNTS                              Systemic VTI:  0.10 m                              Systemic Diam: 2.10 cm   Zoila Shutter MD  Electronically signed by Zoila Shutter MD  Signature Date/Time: 08/24/2023/2:50:38 PM      Final      Findings are consistent with infarction with peri-infarct ischemia in the right coronary artery distribution. The study is high risk. Very poor myocardial blood flow reserve suggests multivessel coronary insufficiency (although cannot rule out failure to respond to vasodilator) and left ventricular systolic function is severely depressed.   LV perfusion is abnormal. There is evidence of ischemia. There is evidence of infarction. Defect 1: There is a medium defect with moderate reduction in uptake present in the mid to basal inferior and inferolateral location(s) that is partially reversible. There is abnormal wall motion  in the defect area. Consistent with infarction and peri-infarct ischemia. Defect 2: There is a small defect with moderate reduction in uptake present in the basal inferoseptal location(s) that is reversible. There is abnormal wall motion in the defect area. Consistent with ischemia.   Rest left ventricular function is abnormal. Rest global function is severely reduced. Rest EF: 19%. Stress left ventricular function is abnormal. Stress global function is severely reduced. Stress EF: 20%. End diastolic cavity size is severely enlarged. End systolic cavity size is severely enlarged.   Myocardial blood flow was computed to be 0.21ml/g/min at rest and 0.60ml/g/min at stress. Global myocardial blood flow reserve was 0.93 and was highly abnormal.   Coronary calcium assessment not  performed due to prior revascularization.   Electronically signed by Thurmon Fair, MD.   CLINICAL DATA:  This over-read does not include interpretation of cardiac or coronary anatomy or pathology. No interpretation the PET data set. The cardiac PET-CT interpretation by the cardiologist is attached.   COMPARISON:  None Available.   FINDINGS: Limited view of the lung parenchyma demonstrates trace RIGHT pleural effusion. Airways are normal.   Limited view of the mediastinum demonstrates no adenopathy. Esophagus normal.   Limited view of the upper abdomen unremarkable.   Limited view of the skeleton and chest wall is unremarkable.   IMPRESSION: Trace RIGHT pleural effusion.     Electronically Signed   By: Genevive Bi M.D.   On: 08/15/2023 10:05  Assessment & Plan: CAD: Severe multivessel CAD, patient would benefit from CABG surgery. Would be at higher risk due to HFrEF. Patient will require Plavix washout if she is a candidate for surgery. Dr. Dorris Fetch to determine candidacy and timing of surgery.  Mitral valve regurgitation: Severe, likely ischemic. Will likely under go MVR if patient is a surgical candidate. Dr. Dorris Fetch to determine candidacy and timing of surgery.  HFrEF: Heart failure optimization prior to surgery HLD HTN T2DM Ischemic cardiomyopathy Pulmonary hypertension   Jenny Reichmann, PA-C 08/28/23  I have seen and examined Mrs. Manahan.  I reviewed her records, catheterization images, and echocardiogram images.  She is a 77 year old woman with a history of hypertension, hyperlipidemia, obesity, type 2 diabetes, coronary disease, prior staged DES to LAD and RCA, acute on chronic systolic and diastolic heart failure, severe mitral regurgitation, and pulmonary hypertension.  Presented with exertional dyspnea and chest pain.  Echo showed ejection fraction of 25 to 30% with severe MR with posterior leaflet tethering.  Cardiac catheterization showed severe  three-vessel coronary disease with chronic total occlusion of the RCA stenosis of the distal left main and subtotal occlusion of the ostial circumflex.  Right heart catheterization showed PA pressure of 83/37, PVR of 5 Wood units, cardiac index 2.04.  Coronary bypass grafting and mitral valve repair/replacement is indicated for survival benefit and relief of symptoms.  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  In that situation there is often a better result with replacement in the wrist with repair.  Final decision on that would be intraoperative, but TEE would be helpful in planning.  Dr. Gala Romney is planning to do one on Thursday.  I discussed the proposed operative procedure with Mrs. Nyce.  I informed her with the general nature of the procedure including the need for general anesthesia, the incisions to be used, the use of cardiopulmonary bypass, the use of temporary pacemaker wires and drainage tubes postoperatively, the expected hospital stay, and the overall recovery.  I  informed her of the indications, risk, benefits, and alternatives.  She understands the risks include, but are not limited to death, MI, stroke, DVT, PE, bleeding, possible need for transfusion, infection, heart block requiring pacemaker, cardiac arrhythmias, respiratory or renal failure, as well as possibility of other unforeseeable complications.  She is currently being treated medically by the advanced heart failure team with a plan to optimize medical therapy prior to surgical intervention.  Salvatore Decent Dorris Fetch, MD [facility] [phone]

## 2023-08-28 NOTE — Interval H&P Note (Signed)
History and Physical Interval Note:  08/28/2023 12:53 PM  [name]  has presented today for surgery, with the diagnosis of CAD-low EF.  The various methods of treatment have been discussed with the patient and family. After consideration of risks, benefits and other options for treatment, the patient has consented to  Procedure(s): RIGHT/LEFT HEART CATH AND CORONARY ANGIOGRAPHY (N/A) as a surgical intervention.  The patient's history has been reviewed, patient examined, no change in status, stable for surgery.  I have reviewed the patient's chart and labs.  Questions were answered to the patient's satisfaction.     Diana [name]

## 2023-08-28 NOTE — Progress Notes (Signed)
Heart Failure Navigator Progress Note  Assessed for Heart & Vascular TOC clinic readiness.  Patient does not meet criteria due to Advanced Heart Failure Team patient of Dr. Bensimhon.   Navigator will sign off at this time.   Lucilia Yanni, BSN, RN Heart Failure Nurse Navigator Secure Chat Only   

## 2023-08-28 NOTE — H&P (Addendum)
Advanced Heart Failure Team  H&P Note   Primary Physician: [name], NP PCP-Cardiologist:  Nicki Guadalajara, MD  Reason for Consultation: Acute on Chronic Systolic Heart Failure + Severe MR w/ Low Output; HF optimization pre CABG + MVR  HPI:    Diana [name] is seen today for evaluation of acute on chronic systolic heart failure w/ low output; HF optimization pre CABG + MVR, at the request of Dr. Excell Seltzer, Cardiology.   77 y/o female w/ h/o CAD s/p remote LAD and RCA PCI in 2011, Type 2DM and HLD.   Echo 12/2012 showed normal LVEF 55-60%, GIDD and normal RV.   Recently seen in clinic by Dr. Tresa Endo and endorsed new and progressive exertional dyspnea and chest pain. Echo 11/24 showed new systolic heart failure, EF 25-30%, severe MR appears ischemic w/ posterior leaflet tethering, RVSP elevated 59 mmHg. RV low normal.  Subsequently referred for outpatient [facility] today.   Cath demonstrated severe multivessel CAD w/ CTO of the RCA, severe stenosis of the dLM, subtotal occlusion of the ostial LCx and severe stenosis of both the mid LAD and 1st diag at the bifurcation point. Also w/ know severe MR, confirmed by invasive hemodynamics with giant V waves of greater than 60 mmHg, severe PH w/ PA 83/37 mmHg with a mean of 56 mmHg, transpulmonary gradient of 20 mmHg and PVR of approximately 5 Wood units. Low output, CO 4.11, CI 2.04, PA sat ~40%.   CT surgery being consulted for consideration of high risk CABG w/ MVR.  AHF team consulted for HF optimization.   R/LHC    Prox RCA lesion is 100% stenosed.   Prox RCA to Dist RCA lesion is 100% stenosed.   Ost Cx to Prox Cx lesion is 95% stenosed.   Mid LM to Dist LM lesion is 75% stenosed.   Ost LAD to Prox LAD lesion is 25% stenosed.   Hemodynamic findings consistent with severe pulmonary hypertension.  (RA 21, PAP 83/37/56, PCWP 36, CO 4.11, CI 2.04)  Echo 08/24/23 1. Left ventricular ejection fraction, by estimation, is 25 to 30%. The  left  ventricle has severely decreased function. The left ventricle  demonstrates global hypokinesis. The left ventricular internal cavity size  was mildly dilated. Left ventricular  diastolic parameters are indeterminate. There is severe akinesis of the  left ventricular, entire inferior wall.   2. Right ventricular systolic function is low normal. The right  ventricular size is normal. There is moderately elevated pulmonary artery  systolic pressure. The estimated right ventricular systolic pressure is  59.3 mmHg.   3. Left atrial size was moderately dilated.   4. Suspect ischemic MR with posterior leaflet tethering. The mitral valve  is abnormal. Severe mitral valve regurgitation.   5. The aortic valve is tricuspid. Aortic valve regurgitation is trivial.  Aortic valve sclerosis/calcification is present, without any evidence of  aortic stenosis.   6. The inferior vena cava is dilated in size with >50% respiratory  variability, suggesting right atrial pressure of 8 mmHg.   Review of Systems: [y] = yes, [ ]  = no   General: Weight gain [ ] ; Weight loss [ ] ; Anorexia [ ] ; Fatigue [ ] ; Fever [ ] ; Chills [ ] ; Weakness [ ]   Cardiac: Chest pain/pressure [ Y]; Resting SOB [ ] ; Exertional SOB [Y]; Orthopnea [ ] ; Pedal Edema [ ] ; Palpitations [ ] ; Syncope [ ] ; Presyncope [ ] ; Paroxysmal nocturnal dyspnea[ ]   Pulmonary: Cough [ ] ; Wheezing[ ] ; Hemoptysis[ ] ;  Sputum [ ] ; Snoring [ ]   GI: Vomiting[ ] ; Dysphagia[ ] ; Melena[ ] ; Hematochezia [ ] ; Heartburn[ ] ; Abdominal pain [ ] ; Constipation [ ] ; Diarrhea [ ] ; BRBPR [ ]   GU: Hematuria[ ] ; Dysuria [ ] ; Nocturia[ ]   Vascular: Pain in legs with walking [ ] ; Pain in feet with lying flat [ ] ; Non-healing sores [ ] ; Stroke [ ] ; TIA [ ] ; Slurred speech [ ] ;  Neuro: Headaches[ ] ; Vertigo[ ] ; Seizures[ ] ; Paresthesias[ ] ;Blurred vision [ ] ; Diplopia [ ] ; Vision changes [ ]   Ortho/Skin: Arthritis [ ] ; Joint pain [ ] ; Muscle pain [ ] ; Joint swelling [ ] ; Back Pain [ ] ;  Rash [ ]   Psych: Depression[ ] ; Anxiety[ ]   Heme: Bleeding problems [ ] ; Clotting disorders [ ] ; Anemia [ ]   Endocrine: Diabetes [ Y]; Thyroid dysfunction[ ]   Home Medications Prior to Admission medications   Medication Sig Start Date End Date Taking? Authorizing Provider  aspirin 81 MG tablet Take 243 mg by mouth daily.   Yes [provider]  BD INSULIN SYRINGE U/F 1/2UNIT 31G X 5/16" 0.3 ML MISC  11/05/19  Yes [provider]  Beta Carotene (VITAMIN A) 25000 UNIT capsule Take 25,000 Units by mouth daily.   Yes [provider]  Biotin 5000 MCG CAPS Take 1 capsule by mouth daily.   Yes [provider]  carvedilol (COREG) 25 MG tablet Take 25 mg by mouth 2 (two) times daily with a meal. 04/23/22  Yes [provider]  clopidogrel (PLAVIX) 75 MG tablet TAKE 1 TABLET BY MOUTH EVERY DAY 05/17/23  Yes Lennette Bihari, MD  cyanocobalamin (VITAMIN B12) 1000 MCG tablet Take 2,000 mcg by mouth daily.   Yes [provider]  ezetimibe (ZETIA) 10 MG tablet TAKE 1 TABLET BY MOUTH EVERY DAY 12/20/22  Yes Lennette Bihari, MD  fish oil-omega-3 fatty acids 1000 MG capsule Take 1 g by mouth daily.   Yes [provider]  gabapentin (NEURONTIN) 100 MG capsule Take 100 mg by mouth See admin instructions. Take 100 mg at bedtime, may take an additional 100 mg twice daily as needed for pain 11/16/21  Yes [provider]  hydrochlorothiazide (HYDRODIURIL) 25 MG tablet Take 25 mg by mouth daily. 09/18/19  Yes [provider]  insulin NPH-regular Human (NOVOLIN 70/30) (70-30) 100 UNIT/ML injection Inject 60 Units into the skin 2 (two) times daily. 10/09/19  Yes [provider]  isosorbide dinitrate (ISORDIL) 30 MG tablet Take 1.5 tablets (45 mg total) by mouth 4 (four) times daily. 08/23/23  Yes Lennette Bihari, MD  loratadine (CLARITIN) 10 MG tablet Take 10 mg by mouth daily. 02/17/22  Yes [provider]  losartan-hydrochlorothiazide  (HYZAAR) 50-12.5 MG tablet TAKE 1 TABLET BY MOUTH EVERY DAY. NEED APPT FOR REFILLS 06/06/23  Yes Lennette Bihari, MD  metFORMIN (GLUCOPHAGE) 1000 MG tablet Take 1,000 mg by mouth 2 (two) times daily with a meal. 04/29/13  Yes [provider]  naproxen sodium (ALEVE) 220 MG tablet Take 220 mg by mouth at bedtime as needed (pain).   Yes [provider]  nitroGLYCERIN (NITROSTAT) 0.4 MG SL tablet Place 1 tablet (0.4 mg total) under the tongue every 5 (five) minutes as needed for chest pain. 07/26/23  Yes Lennette Bihari, MD  Oyster Shell (OYSTER CALCIUM) 500 MG TABS tablet Take 500 mg of elemental calcium by mouth 2 (two) times a week.   Yes [provider]  pantoprazole (PROTONIX) 40 MG  tablet Take 1 tablet by mouth daily. Reported on 03/24/2016 05/06/13  Yes [provider]  rosuvastatin (CRESTOR) 10 MG tablet TAKE 1 TABLET BY MOUTH ONE TIME PER WEEK Patient taking differently: Take 10 mg by mouth every Friday. 06/06/23  Yes Lennette Bihari, MD  Semaglutide,0.25 or 0.5MG /DOS, (OZEMPIC, 0.25 OR 0.5 MG/DOSE,) 2 MG/3ML SOPN Inject 0.5 mg into the skin once a week. 04/19/23  Yes     Past 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    Dyslipidemia    HTN (hypertension)     Past Surgical History: Past Surgical History:  Procedure Laterality Date   PERCUTANEOUS CORONARY STENT INTERVENTION (PCI-S)  05/14/10   LAD   PERCUTANEOUS CORONARY STENT INTERVENTION (PCI-S)  05/17/10   RCA    Family History: Family History  Problem Relation Age of Onset   CAD Neg Hx     Social History: Social History   Socioeconomic History   Marital status: Married    Spouse name: Not on file   Number of children: Not on file   Years of education: Not on file   Highest education level: Not on file  Occupational History   Not on file  Tobacco  Use   Smoking status: Never   Smokeless tobacco: Never  Substance and Sexual Activity   Alcohol use: Yes    Comment: Occasionally-very rare   Drug use: Not on file   Sexual activity: Not on file  Other Topics Concern   Not on file  Social History Narrative   Not on file   Social Determinants of Health   Financial Resource Strain: Not on file  Food Insecurity: Not on file  Transportation Needs: Not on file  Physical Activity: Not on file  Stress: Not on file  Social Connections: Not on file    Allergies:  Allergies  Allergen Reactions   Lisinopril Cough   Sulfa Antibiotics Hives    Objective:    Vital Signs:   Temp:  [98.1 F (36.7 C)] 98.1 F (36.7 C) (11/11 1115) Pulse Rate:  [93] 93 (11/11 1115) Resp:  [16] 16 (11/11 1115) BP: (143)/(81) 143/81 (11/11 1115) SpO2:  [99 %] 99 % (11/11 1115) Weight:  [97.1 kg] 97.1 kg (11/11 1115)    Weight change: Filed Weights   08/28/23 1115  Weight: 97.1 kg    Intake/Output:  No intake or output data in the 24 hours ending 08/28/23 1403    Physical Exam    General:  fatigued appearing. No resp difficulty HEENT: normal Neck: supple. JVP elevated to jaw. Rt internal jugular CV Carotids 2+ bilat; no bruits. No lymphadenopathy or thyromegaly appreciated. Cor: PMI nondisplaced. Regular rate & rhythm. 3/6 MR murmur  Lungs: clear Abdomen: soft, nontender, nondistended. No hepatosplenomegaly. No bruits or masses. Good bowel sounds. Extremities: no cyanosis, clubbing, rash, 1-2+ b/l LE, cool distal ext Neuro: alert & orientedx3, cranial nerves grossly intact. moves all 4 extremities w/o difficulty. Affect pleasant   Telemetry   NSR 70s w/ PVCs   EKG    N/A   Labs   Basic Metabolic Panel: Recent Labs  Lab 08/24/23 1203  NA 143  K 4.4  CL 102  CO2 23  GLUCOSE 57*  BUN 16  CREATININE 1.04*  CALCIUM 9.1    Liver Function Tests: No results for input(s): "  AST", "ALT", "ALKPHOS", "BILITOT", "PROT",  "ALBUMIN" in the last 168 hours. No results for input(s): "LIPASE", "AMYLASE" in the last 168 hours. No results for input(s): "AMMONIA" in the last 168 hours.  CBC: Recent Labs  Lab 08/24/23 1203  WBC 7.4  HGB 10.2*  HCT 33.2*  MCV 83  PLT 351    Cardiac Enzymes: No results for input(s): "CKTOTAL", "CKMB", "CKMBINDEX", "TROPONINI" in the last 168 hours.  BNP: BNP (last 3 results) No results for input(s): "BNP" in the last 8760 hours.  ProBNP (last 3 results) No results for input(s): "PROBNP" in the last 8760 hours.   CBG: Recent Labs  Lab 08/28/23 1117  GLUCAP 178*    Coagulation Studies: No results for input(s): "LABPROT", "INR" in the last 72 hours.   Imaging   No results found.   Medications:     Current Medications:  aspirin  81 mg Oral Pre-Cath   clopidogrel  75 mg Oral Pre-Cath   sodium chloride flush  10 mL Intravenous Q12H    Infusions:     Patient Profile   77 y/o female w/ h/o CAD s/p remote LAD and RCA PCI in 2011, Type 2DM and HLD, recently diagnosed w/ new systolic heart failure and severe MR by echo. 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 AHF team consulted for HF optimization prior to potential CABG + MVR.   Assessment/Plan   1. Acute Systolic Heart Failure w/ Low Output  - new. Prior Echo 2014 w/ normal EF 50-55%, normal RV - Echo 11/24 EF 25-30%, severe MR, RV low normal  - Ischemic CM. LHC w/ severe MVCAD>>awaiting CABG eval - RHC c/w decompensated heart failure, severe LV dysfunction, and severe mitral regurgitation (RA 21, PAP 83/37/56, PCWP 36, CO 4.11, CI 2.04) - start milrinone 0.125 mcg/kg/min  - diurese w/ IV Lasix 40 mg bid (lasix naive). Monitor response     - follow Co-ox and CVPs  - check CMP now. Initiation of oral GDMT if Renal fx/K permits   2. Severe MR - appears ischemic w/ posterior leaflet tethering on echo  - cath with  giant V waves of greater than 60 mmHg + elevated BiV filling pressures - HF optimization w/ diuretics + GDMT for afterload reduction per above  - being evaluated for CABG/ MVR  - ? Stabilization w/ IABP prior to surgery   3. Severe MVCAD - CTO prox-dist RCA, 75% mid-dist LM, 95% ost-prox LCx, 25% ost-prox LAD - CT surgery to evaluate for potential CABG - ASA + statin  - no ? blocker w/ low output   4. Type 2DM  - SSI - check Hgb A1c     Length of Stay: 0  Brittainy Simmons, PA-C  08/28/2023, 2:03 PM  Advanced Heart Failure Team Pager [phone] (M-F; 7a - 5p)  Please contact [facility] for night-coverage after hours (4p -7a ) and weekends on [email or web address]  Agree with above.  77 y/o woman with morbid obesity, DM2, CAD s/p previous PCI. Now with acute systolic HF and severe MR. EF 95-62%  Cath with severe 3v CAD and markedly elevated filling pressures and low output  General: Obese woman  No resp difficulty HEENT: normal Neck: supple. JVP to ear Carotids 2+ bilat; no bruits. No lymphadenopathy or thryomegaly appreciated. Cor: PMI nondisplaced. Regular rate & rhythm. 2/6 MR Lungs: clear Abdomen: soft, nontender, nondistended. No hepatosplenomegaly. No bruits or masses. Good bowel sounds. Extremities: no cyanosis, clubbing,  rash, 1-2+ edema Neuro: alert & orientedx3, cranial nerves grossly intact. moves all 4 extremities w/o difficulty. Affect pleasant  She is very tenuous. Admit for milrinone and IV diuresis. Will need TCTS to see for CABG/MVR.  Follow CVP and Co-ox.   D/w Dr. Excell Seltzer.   CRITICAL CARE Performed by: Arvilla Meres  Total critical care time: 45 minutes  Critical care time was exclusive of separately billable procedures and treating other patients.  Critical care was necessary to treat or prevent imminent or life-threatening deterioration.  Critical care was time spent personally by me (independent of midlevel providers or residents) on the  following activities: development of treatment plan with patient and/or surrogate as well as nursing, discussions with consultants, evaluation of patient's response to treatment, examination of patient, obtaining history from patient or surrogate, ordering and performing treatments and interventions, ordering and review of laboratory studies, ordering and review of radiographic studies, pulse oximetry and re-evaluation of patient's condition.  Arvilla Meres, MD  6:06 PM

## 2023-08-29 ENCOUNTER — Encounter (HOSPITAL_COMMUNITY): Payer: Self-pay | Admitting: Cardiovascular Disease

## 2023-08-29 ENCOUNTER — Inpatient Hospital Stay (HOSPITAL_COMMUNITY): Payer: Medicare HMO

## 2023-08-29 DIAGNOSIS — I5023 Acute on chronic systolic (congestive) heart failure: Secondary | ICD-10-CM | POA: Diagnosis not present

## 2023-08-29 LAB — COOXEMETRY PANEL
Carboxyhemoglobin: 2.1 % — ABNORMAL HIGH (ref 0.5–1.5)
Methemoglobin: 1.7 % — ABNORMAL HIGH (ref 0.0–1.5)
O2 Saturation: 64.4 %
Total hemoglobin: 10.6 g/dL — ABNORMAL LOW (ref 12.0–16.0)

## 2023-08-29 LAB — CBC
HCT: 32.2 % — ABNORMAL LOW (ref 36.0–46.0)
Hemoglobin: 10.2 g/dL — ABNORMAL LOW (ref 12.0–15.0)
MCH: 25.4 pg — ABNORMAL LOW (ref 26.0–34.0)
MCHC: 31.7 g/dL (ref 30.0–36.0)
MCV: 80.3 fL (ref 80.0–100.0)
Platelets: 305 10*3/uL (ref 150–400)
RBC: 4.01 MIL/uL (ref 3.87–5.11)
RDW: 15.7 % — ABNORMAL HIGH (ref 11.5–15.5)
WBC: 8.4 10*3/uL (ref 4.0–10.5)
nRBC: 0 % (ref 0.0–0.2)

## 2023-08-29 LAB — BASIC METABOLIC PANEL
Anion gap: 9 (ref 5–15)
BUN: 15 mg/dL (ref 8–23)
CO2: 29 mmol/L (ref 22–32)
Calcium: 8.8 mg/dL — ABNORMAL LOW (ref 8.9–10.3)
Chloride: 98 mmol/L (ref 98–111)
Creatinine, Ser: 1.02 mg/dL — ABNORMAL HIGH (ref 0.44–1.00)
GFR, Estimated: 57 mL/min — ABNORMAL LOW (ref >60)
Glucose, Bld: 176 mg/dL — ABNORMAL HIGH (ref 70–99)
Potassium: 3.5 mmol/L (ref 3.5–5.1)
Sodium: 136 mmol/L (ref 135–145)

## 2023-08-29 LAB — HEMOGLOBIN A1C
Hgb A1c MFr Bld: 7.8 % — ABNORMAL HIGH (ref 4.8–5.6)
Mean Plasma Glucose: 177.16 mg/dL

## 2023-08-29 LAB — GLUCOSE, CAPILLARY
Glucose-Capillary: 191 mg/dL — ABNORMAL HIGH (ref 70–99)
Glucose-Capillary: 259 mg/dL — ABNORMAL HIGH (ref 70–99)
Glucose-Capillary: 247 mg/dL — ABNORMAL HIGH (ref 70–99)
Glucose-Capillary: 285 mg/dL — ABNORMAL HIGH (ref 70–99)

## 2023-08-29 LAB — CREATININE, SERUM
Creatinine, Ser: 1.09 mg/dL — ABNORMAL HIGH (ref 0.44–1.00)
GFR, Estimated: 53 mL/min — ABNORMAL LOW (ref >60)

## 2023-08-29 LAB — MAGNESIUM: Magnesium: 1.4 mg/dL — ABNORMAL LOW (ref 1.7–2.4)

## 2023-08-29 MED ORDER — POTASSIUM CHLORIDE ER 10 MEQ PO TBCR
20.0000 meq | EXTENDED_RELEASE_TABLET | Freq: Once | ORAL | Status: AC
  Administered 2023-08-29: 20 meq via ORAL
  Filled 2023-08-29 (×2): qty 2

## 2023-08-29 MED ORDER — ASPIRIN 81 MG PO TBEC
81.0000 mg | DELAYED_RELEASE_TABLET | Freq: Every day | ORAL | Status: DC
  Administered 2023-08-29 – 2023-09-05 (×8): 81 mg via ORAL
  Filled 2023-08-29 (×9): qty 1

## 2023-08-29 MED ORDER — MAGNESIUM SULFATE 2 GM/50ML IV SOLN
2.0000 g | Freq: Once | INTRAVENOUS | Status: AC
  Administered 2023-08-29: 2 g via INTRAVENOUS
  Filled 2023-08-29: qty 50

## 2023-08-29 MED ORDER — ORAL CARE MOUTH RINSE
15.0000 mL | OROMUCOSAL | Status: DC | PRN
Start: 2023-08-29 — End: 2023-09-06

## 2023-08-29 MED ORDER — POTASSIUM CHLORIDE CRYS ER 20 MEQ PO TBCR
40.0000 meq | EXTENDED_RELEASE_TABLET | Freq: Once | ORAL | Status: AC
  Administered 2023-08-29: 40 meq via ORAL
  Filled 2023-08-29: qty 2

## 2023-08-29 MED ORDER — MAGNESIUM SULFATE 4 GM/100ML IV SOLN
4.0000 g | Freq: Once | INTRAVENOUS | Status: AC
Start: 2023-08-29 — End: 2023-08-29
  Administered 2023-08-29: 4 g via INTRAVENOUS
  Filled 2023-08-29: qty 100

## 2023-08-29 MED ORDER — SPIRONOLACTONE 12.5 MG HALF TABLET
12.5000 mg | ORAL_TABLET | Freq: Every day | ORAL | Status: DC
  Administered 2023-08-29 – 2023-09-05 (×8): 12.5 mg via ORAL
  Filled 2023-08-29 (×8): qty 1

## 2023-08-29 MED ORDER — LOSARTAN POTASSIUM 25 MG PO TABS
25.0000 mg | ORAL_TABLET | Freq: Every day | ORAL | Status: DC
  Administered 2023-08-29 – 2023-08-30 (×2): 25 mg via ORAL
  Filled 2023-08-29 (×2): qty 1

## 2023-08-29 NOTE — Progress Notes (Addendum)
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   Remains on milrinone 0.125. Co-ox 64% today   I/Os incomplete, 4 unmeasured urinary occurences charted but pt reports robust UOP. Feels much better. Breathing significantly improved. CVP 6 today. Denies CP   SCr bumped, 0.86>>1.09. SBPs 120s.    Objective:   Weight Range: 94 kg Body mass index is 35.57 kg/m.   Vital Signs:   Temp:  [98 F (36.7 C)-98.6 F (37 C)] 98.6 F (37 C) (11/12 0329) Pulse Rate:  [0-142] 83 (11/12 0600) Resp:  [15-37] 15 (11/12 0600) BP: (105-153)/(54-99) 121/56 (11/12 0500) SpO2:  [82 %-99 %] 97 % (11/12 0600) Weight:  [94 kg-97.1 kg] 94 kg (11/12 0400) Last BM Date :  (PTA)  Weight change: Filed Weights   08/28/23 1115 08/28/23 2000 08/29/23 0400  Weight: 97.1 kg 94.2 kg 94 kg    Intake/Output:   Intake/Output Summary (Last 24 hours) at 08/29/2023 0759 Last data filed at 08/29/2023 0645 Gross per 24 hour  Intake 121.54 ml  Output 350 ml  Net -228.46 ml      Physical Exam    CVP 6 General:  Well appearing, sitting up in chair. No resp difficulty HEENT: Normal Neck: Supple. JVP 6-7 cm . Carotids 2+ bilat; no bruits. No lymphadenopathy or thyromegaly appreciated. + Lt internal jugular CVC Cor: PMI nondisplaced. Regular rate & rhythm. 2/6 MR Lungs: decreased BS at the bases bilaterally  Abdomen: Soft, nontender, nondistended. No hepatosplenomegaly. No bruits or masses. Good bowel sounds. Extremities: No cyanosis, clubbing, rash, trace b/l LE ankle/pretibial edema Neuro: Alert & orientedx3, cranial nerves grossly intact. moves all 4 extremities w/o difficulty. Affect pleasant   Telemetry   NSR 80s   EKG    N/A   Labs    CBC Recent Labs    08/28/23 1435 08/29/23 0335  WBC 7.8 8.4  HGB 10.7* 10.2*  HCT 33.8* 32.2*   MCV 79.7* 80.3  PLT 351 305   Basic Metabolic Panel Recent Labs    16/10/96 1435 08/29/23 0335  NA 137 136  K 4.3 3.5  CL 101 98  CO2 24 29  GLUCOSE 170* 176*  BUN 15 15  CREATININE 0.86 1.02*  1.09*  CALCIUM 9.0 8.8*  MG  --  1.4*   Liver Function Tests Recent Labs    08/28/23 1435  AST 17  ALT 21  ALKPHOS 37*  BILITOT 0.5  PROT 7.0  ALBUMIN 3.6   No results for input(s): "LIPASE", "AMYLASE" in the last 72 hours. Cardiac Enzymes No results for input(s): "CKTOTAL", "CKMB", "CKMBINDEX", "TROPONINI" in the last 72 hours.  BNP: BNP (last 3 results) No results for input(s): "BNP" in the last 8760 hours.  ProBNP (last 3 results) No results for input(s): "PROBNP" in the last 8760 hours.   D-Dimer No results for input(s): "DDIMER" in the last 72 hours. Hemoglobin A1C Recent Labs    08/29/23 0335  HGBA1C 7.8*   Fasting Lipid Panel No results for input(s): "CHOL", "HDL", "LDLCALC", "TRIG", "CHOLHDL", "LDLDIRECT" in the last 72 hours. Thyroid Function Tests No results for input(s): "TSH", "T4TOTAL", "T3FREE", "THYROIDAB" in the last 72 hours.  Invalid input(s): "FREET3"  Other results:   Imaging    CARDIAC CATHETERIZATION  Result Date: 08/28/2023 1.  Severe multivessel coronary artery disease with total occlusion  of the RCA, severe stenosis of the distal left main, subtotal occlusion of the ostial circumflex, and severe stenosis of both the mid LAD and first diagonal at the bifurcation point 2.  Known severe MR by echo assessment confirmed by invasive hemodynamics with giant V waves of greater than 60 mmHg 3.  Severe pulmonary hypertension with PA pressure 83/37 mmHg with a mean of 56 mmHg, transpulmonary gradient of 20 mmHg and PVR of approximately 5 Wood units 4.  Successful placement of left internal jugular triple-lumen catheter Recommendations: The patient has severe left main and multivessel coronary artery disease with decompensated heart failure,  severe LV dysfunction, and severe mitral regurgitation.  Plan admit to advanced heart failure team for IV diuresis and heart failure optimization.  Obtain cardiac surgical consultation for consideration of high risk CABG/mitral valve repair     Medications:     Scheduled Medications:  Chlorhexidine Gluconate Cloth  6 each Topical Daily   Chlorhexidine Gluconate Cloth  6 each Topical Daily   enoxaparin (LOVENOX) injection  40 mg Subcutaneous Q24H   ezetimibe  10 mg Oral Daily   furosemide  40 mg Intravenous BID   gabapentin  100 mg Oral QHS   insulin aspart  0-15 Units Subcutaneous TID WC   insulin aspart  0-5 Units Subcutaneous QHS   isosorbide dinitrate  30 mg Oral TID   loratadine  10 mg Oral Daily   pantoprazole  40 mg Oral Daily   rosuvastatin  40 mg Oral Daily   sodium chloride flush  10-40 mL Intracatheter Q12H   sodium chloride flush  3 mL Intravenous Q12H    Infusions:  sodium chloride     sodium chloride     milrinone 0.125 mcg/kg/min (08/29/23 0645)    PRN Medications: sodium chloride, sodium chloride, acetaminophen, nitroGLYCERIN, ondansetron (ZOFRAN) IV, mouth rinse, sodium chloride flush, sodium chloride flush    Patient Profile   77 y/o female w/ h/o CAD s/p remote LAD and RCA PCI in 2011, Type 2DM and HLD, recently diagnosed w/ new systolic heart failure and severe MR by echo. 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 AHF team consulted for HF optimization prior to potential CABG + MVR.  Assessment/Plan   1. Acute Systolic Heart Failure w/ Low Output  - new. Prior Echo 2014 w/ normal EF 50-55%, normal RV - Echo 11/24 EF 25-30%, severe MR, RV low normal  - Ischemic CM. LHC w/ severe MVCAD>>awaiting CABG eval - RHC c/w decompensated heart failure, severe LV dysfunction, and severe mitral regurgitation (RA 21, PAP 83/37/56, PCWP 36, CO 4.11, CI 2.04) - on milrinone 0.125  mcg/kg/min. Co-ox 64%. Brisk diuresis yesterday. CVP 6 today. Breathing much improved   - will give 1 more dose of IV Lasix this morning then transition to PO     - add spiro 12.5 mg daily  - add losartan 12.5 mg daily  - no ? blocker w/ low output - hold off on ARNi and SGLT2i until post-op     2. Severe MR - appears ischemic w/ posterior leaflet tethering on echo  - cath with giant V waves of greater than 60 mmHg + elevated BiV filling pressures - HF optimization w/ milrinone, diuretics + GDMT for afterload reduction per above  - being evaluated for CABG/ MVR  - plan TEE later this wk    3. Severe MVCAD - CTO prox-dist RCA, 75% mid-dist LM, 95% ost-prox LCx, 25%  ost-prox LAD - denies resting CP  - CT surgery to evaluate for potential CABG. Dr. Dorris Fetch to see today  - ASA + statin  - no ? blocker w/ low output    4. Type 2DM  - Hgb A1c 7.8  - SSI  5. AKI  - scr bump 0.86>>1.02 - likely 2/2 diuresis +/- contrast  - CO now supported w/ milrinone. BP stable   - follow closely w/ diuresis    Length of Stay: 1  Brittainy Simmons, PA-C  08/29/2023, 7:59 AM  Advanced Heart Failure Team Pager [phone] (M-F; 7a - 5p)  Please contact [facility] for night-coverage after hours (5p -7a ) and weekends on [email or web address]  Patient seen and examined with the above-signed Advanced Practice Provider and/or Housestaff. I personally reviewed laboratory data, imaging studies and relevant notes. I independently examined the patient and formulated the important aspects of the plan. I have edited the note to reflect any of my changes or salient points. I have personally discussed the plan with the patient and/or family.  Remains on milrinone and IV lasix. Diuresed well. Co-ox 62% CVP 6 (checked personally).   Beathing much better.   Seen by TCTS CABG/MVR next Wednesday  General: Obese woman sitting in chair No resp difficulty HEENT: normal Neck: supple. Jvp 6-7 Carotids 2+ bilat; no  bruits. No lymphadenopathy or thryomegaly appreciated. Cor: PMI nondisplaced. Regular rate & rhythm. 2/6 MR Lungs: clear Abdomen: obese soft, nontender, nondistended. No hepatosplenomegaly. No bruits or masses. Good bowel sounds. Extremities: no cyanosis, clubbing, rash, edema Neuro: alert & orientedx3, cranial nerves grossly intact. moves all 4 extremities w/o difficulty. Affect pleasant  Much improved with milrinone and IV diuresis. Will continue.   Plan TEE to look at MV on Thursday once diuresed fully (D/w Dr. Dorris Fetch).   Titrate GDMT. Ambulate. Encourage IS.   Can go out of ICU. ? If she can go home for a few days prior to OR?  Arvilla Meres, MD  10:08 AM

## 2023-08-29 NOTE — TOC Initial Note (Signed)
Transition of Care [facility]) - Initial/Assessment Note    Patient Details  Name: Diana [name] MRN: [id number] Date of Birth: August 13, 1946  Transition of Care [facility]) CM/SW Contact:    Nicanor Bake Phone Number: [phone] 08/29/2023, 2:37 PM  Clinical Narrative:    HF CSW called pts husband and left a VM asking to be called back.   TOC will continue following.                      Patient Goals and CMS Choice            Expected Discharge Plan and Services                                              Prior Living Arrangements/Services                       Activities of Daily Living   ADL Screening (condition at time of admission) Independently performs ADLs?: Yes (appropriate for developmental age) Is the patient deaf or have difficulty hearing?: No Does the patient have difficulty seeing, even when wearing glasses/contacts?: No Does the patient have difficulty concentrating, remembering, or making decisions?: No  Permission Sought/Granted                  Emotional Assessment              Admission diagnosis:  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, <= 40%) (HCC) [I50.23] 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, <= 40%) and combined systolic and diastolic dysfunction (HCC) [I50.43] Patient Active Problem List   Diagnosis Date Noted   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, <= 40%) (HCC) 08/28/2023   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, <= 40%) and combined systolic and diastolic dysfunction (HCC) 08/28/2023   COPD exacerbation (HCC) 11/13/2016   Diabetes mellitus with diabetic neuropathy (HCC) 08/24/2015   Dyslipidemia    Diastolic dysfunction-garde 1    CAD-staged LAD DES (July 2011), RCA DES (Aug 2011) 05/24/2013   DM2 (diabetes mellitus, type 2) (HCC) 05/24/2013   HTN (hypertension) 05/24/2013    Obesity (BMI 30-39.9) 05/24/2013   PCP:  [name], NP Pharmacy:   [facility] - [place], [place] - [address] [place] [address] Phone: [phone] Fax: [phone]  [id number] [place], [place] - [address] AT [facility] [address] [place] [address] Phone: [phone] Fax: [phone]     Social Determinants of Health (SDOH) Social History: SDOH Screenings   Food Insecurity: No Food Insecurity (08/28/2023)  Housing: Low Risk  (08/28/2023)  Transportation Needs: No Transportation Needs (08/28/2023)  Utilities: Not At Risk (08/28/2023)  Tobacco Use: Low Risk  (08/23/2023)   SDOH Interventions:     Readmission Risk Interventions     No data to display

## 2023-08-29 NOTE — Progress Notes (Signed)
      [address]       [name] [address]             [phone]      Patient seen and examined.  Cath images and echo images and records reviewed.  Please see full consultation note dated 08/28/2023 for full details.  Plan would be coronary bypass grafting and mitral valve repair or replacement after medical optimization.  Discussed with Dr. Lenis Dickinson C. Dorris Fetch, MD [facility] [phone]

## 2023-08-29 NOTE — Inpatient Diabetes Management (Signed)
Inpatient Diabetes Program Recommendations  AACE/ADA: New Consensus Statement on Inpatient Glycemic Control (2015)  Target Ranges:  Prepandial:   less than 140 mg/dL      Peak postprandial:   less than 180 mg/dL (1-2 hours)      Critically ill patients:  140 - 180 mg/dL   Lab Results  Component Value Date   GLUCAP 259 (H) 08/29/2023   HGBA1C 7.8 (H) 08/29/2023    Review of Glycemic Control  Latest Reference Range & Units 08/28/23 20:19 08/29/23 06:19 08/29/23 11:58  Glucose-Capillary 70 - 99 mg/dL 010 (H) 272 (H) 536 (H)  (H): Data is abnormally high Diabetes history: Type 2 DM Outpatient Diabetes medications: Novolog 70/30 60 units BID, Metformin 1000 mg BID, Ozempic 0.5 mg qwk Current orders for Inpatient glycemic control: Novolog 0-15 units TID & HS  Inpatient Diabetes Program Recommendations:    Consider adding Semglee 18 units at bedtime.   Thanks, Lujean Rave, MSN, RNC-OB Diabetes Coordinator [phone] (8a-5p)

## 2023-08-30 ENCOUNTER — Other Ambulatory Visit (HOSPITAL_COMMUNITY): Payer: Self-pay

## 2023-08-30 DIAGNOSIS — I5023 Acute on chronic systolic (congestive) heart failure: Secondary | ICD-10-CM | POA: Diagnosis not present

## 2023-08-30 LAB — BASIC METABOLIC PANEL
Anion gap: 9 (ref 5–15)
BUN: 21 mg/dL (ref 8–23)
CO2: 27 mmol/L (ref 22–32)
Calcium: 8.3 mg/dL — ABNORMAL LOW (ref 8.9–10.3)
Chloride: 97 mmol/L — ABNORMAL LOW (ref 98–111)
Creatinine, Ser: 1 mg/dL (ref 0.44–1.00)
GFR, Estimated: 59 mL/min — ABNORMAL LOW (ref >60)
Glucose, Bld: 259 mg/dL — ABNORMAL HIGH (ref 70–99)
Potassium: 3.5 mmol/L (ref 3.5–5.1)
Sodium: 133 mmol/L — ABNORMAL LOW (ref 135–145)

## 2023-08-30 LAB — CBC
HCT: 32.7 % — ABNORMAL LOW (ref 36.0–46.0)
Hemoglobin: 10.1 g/dL — ABNORMAL LOW (ref 12.0–15.0)
MCH: 24.8 pg — ABNORMAL LOW (ref 26.0–34.0)
MCHC: 30.9 g/dL (ref 30.0–36.0)
MCV: 80.3 fL (ref 80.0–100.0)
Platelets: 299 10*3/uL (ref 150–400)
RBC: 4.07 MIL/uL (ref 3.87–5.11)
RDW: 15.7 % — ABNORMAL HIGH (ref 11.5–15.5)
WBC: 7.2 10*3/uL (ref 4.0–10.5)
nRBC: 0 % (ref 0.0–0.2)

## 2023-08-30 LAB — COOXEMETRY PANEL
Carboxyhemoglobin: 1.7 % — ABNORMAL HIGH (ref 0.5–1.5)
Methemoglobin: <0.7 % (ref 0.0–1.5)
O2 Saturation: 71.7 %
Total hemoglobin: 10.8 g/dL — ABNORMAL LOW (ref 12.0–16.0)

## 2023-08-30 LAB — MAGNESIUM: Magnesium: 2.2 mg/dL (ref 1.7–2.4)

## 2023-08-30 LAB — GLUCOSE, CAPILLARY
Glucose-Capillary: 257 mg/dL — ABNORMAL HIGH (ref 70–99)
Glucose-Capillary: 241 mg/dL — ABNORMAL HIGH (ref 70–99)
Glucose-Capillary: 302 mg/dL — ABNORMAL HIGH (ref 70–99)
Glucose-Capillary: 321 mg/dL — ABNORMAL HIGH (ref 70–99)
Glucose-Capillary: 285 mg/dL — ABNORMAL HIGH (ref 70–99)

## 2023-08-30 LAB — HEPARIN LEVEL (UNFRACTIONATED): Heparin Unfractionated: 0.59 [IU]/mL (ref 0.30–0.70)

## 2023-08-30 MED ORDER — ALUM & MAG HYDROXIDE-SIMETH 200-200-20 MG/5ML PO SUSP
30.0000 mL | Freq: Once | ORAL | Status: AC
  Administered 2023-08-30: 30 mL via ORAL
  Filled 2023-08-30: qty 30

## 2023-08-30 MED ORDER — NOREPINEPHRINE 4 MG/250ML-% IV SOLN: 0.0000 ug/min | INTRAVENOUS | Status: DC

## 2023-08-30 MED ORDER — INSULIN GLARGINE-YFGN 100 UNIT/ML ~~LOC~~ SOLN
18.0000 [IU] | Freq: Every day | SUBCUTANEOUS | Status: DC
Start: 2023-08-30 — End: 2023-09-01
  Administered 2023-08-30 – 2023-08-31 (×2): 18 [IU] via SUBCUTANEOUS
  Filled 2023-08-30 (×3): qty 0.18

## 2023-08-30 MED ORDER — AMIODARONE HCL IN DEXTROSE 360-4.14 MG/200ML-% IV SOLN
60.0000 mg/h | INTRAVENOUS | Status: AC
  Administered 2023-08-30 (×2): 60 mg/h via INTRAVENOUS

## 2023-08-30 MED ORDER — HEPARIN (PORCINE) 25000 UT/250ML-% IV SOLN
1100.0000 [IU]/h | INTRAVENOUS | Status: DC
  Administered 2023-08-30 – 2023-09-01 (×3): 1100 [IU]/h via INTRAVENOUS
  Administered 2023-09-02 – 2023-09-04 (×4): 1200 [IU]/h via INTRAVENOUS
  Administered 2023-09-05: 1100 [IU]/h via INTRAVENOUS
  Filled 2023-08-30 (×8): qty 250

## 2023-08-30 MED ORDER — AMIODARONE LOAD VIA INFUSION
150.0000 mg | Freq: Once | INTRAVENOUS | Status: AC
  Administered 2023-08-30: 150 mg via INTRAVENOUS
  Filled 2023-08-30: qty 83.34

## 2023-08-30 MED ORDER — AMIODARONE HCL IN DEXTROSE 360-4.14 MG/200ML-% IV SOLN
30.0000 mg/h | INTRAVENOUS | Status: DC
  Administered 2023-08-30 – 2023-09-06 (×13): 30 mg/h via INTRAVENOUS
  Filled 2023-08-30 (×6): qty 200
  Filled 2023-08-30: qty 400
  Filled 2023-08-30 (×8): qty 200

## 2023-08-30 MED ORDER — HEPARIN BOLUS VIA INFUSION
3000.0000 [IU] | Freq: Once | INTRAVENOUS | Status: AC
  Administered 2023-08-30: 3000 [IU] via INTRAVENOUS
  Filled 2023-08-30: qty 3000

## 2023-08-30 MED ORDER — POTASSIUM CHLORIDE CRYS ER 20 MEQ PO TBCR
40.0000 meq | EXTENDED_RELEASE_TABLET | Freq: Two times a day (BID) | ORAL | Status: AC
  Administered 2023-08-30 (×2): 40 meq via ORAL
  Filled 2023-08-30 (×2): qty 2

## 2023-08-30 NOTE — Progress Notes (Signed)
PHARMACY - ANTICOAGULATION CONSULT NOTE  Pharmacy Consult for heparin Indication:  atrial flutter  Allergies  Allergen Reactions   Lisinopril Cough   Sulfa Antibiotics Hives    Patient Measurements: Height: 5\' 4"  (162.6 cm) Weight: 93 kg (205 lb 0.4 oz) IBW/kg (Calculated) : 54.7 Heparin Dosing Weight: 77 kg   Vital Signs: Temp: 98.6 F (37 C) (11/13 0803) Temp Source: Oral (11/13 0803) BP: 127/93 (11/13 0815) Pulse Rate: 134 (11/13 0830)  Labs: Recent Labs    08/28/23 1435 08/29/23 0335 08/30/23 0355  HGB 10.7* 10.2* 10.1*  HCT 33.8* 32.2* 32.7*  PLT 351 305 299  CREATININE 0.86 1.02*  1.09* 1.00    Estimated Creatinine Clearance: 53.7 mL/min (by C-G formula based on SCr of 1 mg/dL).   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    Dyslipidemia    HTN (hypertension)     Medications:  Scheduled:   aspirin EC  81 mg Oral Daily   Chlorhexidine Gluconate Cloth  6 each Topical Daily   Chlorhexidine Gluconate Cloth  6 each Topical Daily   enoxaparin (LOVENOX) injection  40 mg Subcutaneous Q24H   ezetimibe  10 mg Oral Daily   furosemide  40 mg Intravenous BID   gabapentin  100 mg Oral QHS   insulin aspart  0-15 Units Subcutaneous TID WC   insulin aspart  0-5 Units Subcutaneous QHS   isosorbide dinitrate  30 mg Oral TID   loratadine  10 mg Oral Daily   losartan  25 mg Oral Daily   pantoprazole  40 mg Oral Daily   potassium chloride  40 mEq Oral BID   rosuvastatin  40 mg Oral Daily   sodium chloride flush  10-40 mL Intracatheter Q12H   sodium chloride flush  3 mL Intravenous Q12H   spironolactone  12.5 mg Oral Daily    Assessment: 77 yof presenting for HF optimization after outpatient cath found 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 Now went into Aflutter 11/13 - no AC PTA.  Hgb 10.1, plt 299. On enoxaparin  40 mg for DVT ppx (LD 11/13@0820 ). No s/sx of bleeding.   Goal of Therapy:  Heparin level 0.3-0.7 units/ml Monitor platelets by anticoagulation protocol: Yes   Plan:  Give 3000 units bolus x 1 Start heparin infusion at 1100 units/hr Check anti-Xa level in 8 hours and daily while on heparin Continue to monitor H&H and platelets  Thank you for allowing pharmacy to participate in this patient's care,  Sherron Monday, PharmD, BCCCP Clinical Pharmacist  Phone: [phone] 08/30/2023 9:29 AM  Please check AMION for all [facility] Pharmacy phone numbers After 10:00 PM, call Main Pharmacy [phone]

## 2023-08-30 NOTE — TOC Benefit Eligibility Note (Addendum)
Patient Product/process development scientist completed.    The patient is insured through [employer]. Patient has Medicare and is not eligible for a copay card, but may be able to apply for patient assistance, if available.    Ran test claim for Eliquis 5 mg and the current 30 day co-pay is $139.76 due to being in Coverage Gap (donut hole).   This test claim was processed through [facility]- copay amounts may vary at other pharmacies due to pharmacy/plan contracts, or as the patient moves through the different stages of their insurance plan.     Roland Earl, CPHT Pharmacy Technician III Certified Patient Advocate [facility] Patient Advocate Team Direct Number: [phone]  Fax: [phone]

## 2023-08-30 NOTE — TOC Initial Note (Signed)
Transition of Care [facility]) - Initial/Assessment Note    Patient Details  Name: Diana [name] MRN: [id number] Date of Birth: 05-27-1946  [facility] CM/SW Contact:    Diana Cousin, RN Phone Number: [phone] 08/30/2023, 6:05 PM   Clinical Narrative:    CM spoke to pt and states husband and dtr assist with her care. She requested info on attending to send paperwork for FMLA.  She has RW at home.  States she works full-time.                 Expected Discharge Plan: Home/Self Care Barriers to Discharge: Continued Medical Work up   Patient Goals and CMS Choice Patient states their goals for this hospitalization and ongoing recovery are:: wants to get better          Expected Discharge Plan and Services   Discharge Planning Services: CM Consult   Living arrangements for the past 2 months: Single Family Home                                      Prior Living Arrangements/Services Living arrangements for the past 2 months: Single Family Home Lives with:: Adult Children, Spouse Patient language and need for interpreter reviewed:: Yes Do you feel safe going back to the place where you live?: Yes      Need for Family Participation in Patient Care: No (Comment) Care giver support system in place?: Yes (comment) Current home services: DME (rolling walker) Criminal Activity/Legal Involvement Pertinent to Current Situation/Hospitalization: No - Comment as needed  Activities of Daily Living   ADL Screening (condition at time of admission) Independently performs ADLs?: Yes (appropriate for developmental age) Is the patient deaf or have difficulty hearing?: No Does the patient have difficulty seeing, even when wearing glasses/contacts?: No Does the patient have difficulty concentrating, remembering, or making decisions?: No  Permission Sought/Granted Permission sought to share information with : Case Manager, Family Supports, PCP Permission  granted to share information with : Yes, Verbal Permission Granted  Share Information with NAME: Diana [name]     Permission granted to share info w Relationship: husband  Permission granted to share info w Contact Information: [phone]  Emotional Assessment Appearance:: Appears stated age Attitude/Demeanor/Rapport: Engaged Affect (typically observed): Accepting Orientation: : Oriented to Self, Oriented to Place, Oriented to  Time, Oriented to Situation   Psych Involvement: No (comment)  Admission diagnosis:  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, <= 40%) (HCC) [I50.23] 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, <= 40%) and combined systolic and diastolic dysfunction (HCC) [I50.43] Patient Active Problem List   Diagnosis Date Noted   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, <= 40%) (HCC) 08/28/2023   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, <= 40%) and combined systolic and diastolic dysfunction (HCC) 08/28/2023   COPD exacerbation (HCC) 11/13/2016   Diabetes mellitus with diabetic neuropathy (HCC) 08/24/2015   Dyslipidemia    Diastolic dysfunction-garde 1    CAD-staged LAD DES (July 2011), RCA DES (Aug 2011) 05/24/2013   DM2 (diabetes mellitus, type 2) (HCC) 05/24/2013   HTN (hypertension) 05/24/2013   Obesity (BMI 30-39.9) 05/24/2013   PCP:  Diana Bosch, NP Pharmacy:   [facility] - [place], [place] - [address] [place] [address] Phone: [phone]  Fax: [phone]  [id number] [place], [place] - [address] AT [facility] [address] [place] [address] Phone: [phone] Fax: [phone]     Social Determinants of Health (SDOH) Social History: SDOH Screenings   Food Insecurity: No Food Insecurity (08/28/2023)  Housing: Low Risk  (08/28/2023)  Transportation  Needs: No Transportation Needs (08/28/2023)  Utilities: Not At Risk (08/28/2023)  Tobacco Use: Low Risk  (08/29/2023)   SDOH Interventions:     Readmission Risk Interventions     No data to display

## 2023-08-30 NOTE — Progress Notes (Addendum)
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   Remains on milrinone 0.125. Co-ox 72%   In SVT/ ? AFL this morning, rates 150s. Symptomatic w/ "indigestion", feels hot/flushed. Denies dyspnea. Currently normotensive.   1.9L in UOP yesterday. Wt continues to trend down. RN recorded CVP ~6 overnight, but reading ~12 on my exam.   SCr stable, 1.0. K 3.5, Mg 2.2. SBPs 110s on oral GDMT.    Objective:   Weight Range: 93 kg Body mass index is 35.19 kg/m.   Vital Signs:   Temp:  [98 F (36.7 C)-98.7 F (37.1 C)] 98 F (36.7 C) (11/13 0325) Pulse Rate:  [77-114] 77 (11/13 0600) Resp:  [14-30] 21 (11/13 0600) BP: (96-143)/(51-111) 118/59 (11/13 0600) SpO2:  [87 %-98 %] 95 % (11/13 0600) Weight:  [93 kg-93.6 kg] 93 kg (11/13 0631) Last BM Date : 08/29/23  Weight change: Filed Weights   08/29/23 0400 08/29/23 1259 08/30/23 0631  Weight: 94 kg 93.6 kg 93 kg    Intake/Output:   Intake/Output Summary (Last 24 hours) at 08/30/2023 0750 Last data filed at 08/30/2023 0400 Gross per 24 hour  Intake 256.27 ml  Output 1875 ml  Net -1618.73 ml      Physical Exam    General:  fatigued appearing. No respiratory difficulty HEENT: normal Neck: supple. JVD assessment difficult. Carotids 2+ bilat; no bruits. No lymphadenopathy or thyromegaly appreciated. Cor: PMI nondisplaced. Irregularly irregular rhythm and tachy rate. No rubs, gallops or murmurs. Lungs: decreased BS at the bases  Abdomen: soft, nontender, nondistended. No hepatosplenomegaly. No bruits or masses. Good bowel sounds. Extremities: no cyanosis, clubbing, rash, trace b/l LE edema Neuro: alert & oriented x 3, cranial nerves grossly intact. moves all 4 extremities w/o difficulty. Affect pleasant.  Telemetry   SVT ? AFL 150s, personally reviewed    EKG    12  lead ordered   Labs    CBC Recent Labs    08/29/23 0335 08/30/23 0355  WBC 8.4 7.2  HGB 10.2* 10.1*  HCT 32.2* 32.7*  MCV 80.3 80.3  PLT 305 299   Basic Metabolic Panel Recent Labs    10/62/69 0335 08/30/23 0355  NA 136 133*  K 3.5 3.5  CL 98 97*  CO2 29 27  GLUCOSE 176* 259*  BUN 15 21  CREATININE 1.02*  1.09* 1.00  CALCIUM 8.8* 8.3*  MG 1.4* 2.2   Liver Function Tests Recent Labs    08/28/23 1435  AST 17  ALT 21  ALKPHOS 37*  BILITOT 0.5  PROT 7.0  ALBUMIN 3.6   No results for input(s): "LIPASE", "AMYLASE" in the last 72 hours. Cardiac Enzymes No results for input(s): "CKTOTAL", "CKMB", "CKMBINDEX", "TROPONINI" in the last 72 hours.  BNP: BNP (last 3 results) No results for input(s): "BNP" in the last 8760 hours.  ProBNP (last 3 results) No results for input(s): "PROBNP" in the last 8760 hours.   D-Dimer No results for input(s): "DDIMER" in the last 72 hours. Hemoglobin A1C Recent Labs    08/29/23 0335  HGBA1C 7.8*   Fasting Lipid Panel No results for input(s): "CHOL", "HDL", "LDLCALC", "TRIG", "CHOLHDL", "LDLDIRECT" in the last 72 hours. Thyroid Function Tests No results for input(s): "TSH", "T4TOTAL", "T3FREE", "THYROIDAB" in the last 72 hours.  Invalid input(s): "FREET3"  Other results:   Imaging  DG Orthopantogram  Result Date: 08/29/2023 CLINICAL DATA:  Cardiac surgery planning. Assess for dental or periodontal disease. EXAM: ORTHOPANTOGRAM/PANORAMIC COMPARISON:  None Available. FINDINGS: Residual right maxillary molar with filling. 3 residual maxillary incisors. Seven residual anterior mandibular teeth, 2 with fillings. No evidence of any retained root fragment elsewhere. No evidence of active periodontal disease. IMPRESSION: No evidence of active dental or periodontal disease. Residual teeth as above. Electronically Signed   By: Paulina Fusi M.D.   On: 08/29/2023 15:46     Medications:     Scheduled Medications:  aspirin  EC  81 mg Oral Daily   Chlorhexidine Gluconate Cloth  6 each Topical Daily   Chlorhexidine Gluconate Cloth  6 each Topical Daily   enoxaparin (LOVENOX) injection  40 mg Subcutaneous Q24H   ezetimibe  10 mg Oral Daily   furosemide  40 mg Intravenous BID   gabapentin  100 mg Oral QHS   insulin aspart  0-15 Units Subcutaneous TID WC   insulin aspart  0-5 Units Subcutaneous QHS   isosorbide dinitrate  30 mg Oral TID   loratadine  10 mg Oral Daily   losartan  25 mg Oral Daily   pantoprazole  40 mg Oral Daily   rosuvastatin  40 mg Oral Daily   sodium chloride flush  10-40 mL Intracatheter Q12H   sodium chloride flush  3 mL Intravenous Q12H   spironolactone  12.5 mg Oral Daily    Infusions:  milrinone 0.125 mcg/kg/min (08/30/23 0400)    PRN Medications: acetaminophen, nitroGLYCERIN, ondansetron (ZOFRAN) IV, mouth rinse, sodium chloride flush, sodium chloride flush    Patient Profile   77 y/o female w/ h/o CAD s/p remote LAD and RCA PCI in 2011, Type 2DM and HLD, recently diagnosed w/ new systolic heart failure and severe MR by echo. 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 AHF team consulted for HF optimization prior to potential CABG + MVR.  Assessment/Plan   1. Acute Systolic Heart Failure w/ Low Output  - new. Prior Echo 2014 w/ normal EF 50-55%, normal RV - Echo 11/24 EF 25-30%, severe MR, RV low normal  - Ischemic CM. LHC w/ severe MVCAD. Planning CABG 11/20  - RHC c/w decompensated heart failure, severe LV dysfunction, and severe mitral regurgitation (RA 21, PAP 83/37/56, PCWP 36, CO 4.11, CI 2.04) - on milrinone 0.125 mcg/kg/min. Co-ox 72%. Volume overall improved w/ diuresis but starting to trend back up now in AFL. SCr/BP stable - continue IV Lasix 40 mg bid  - continue spiro 12.5 mg daily  - continue losartan 12.5 mg daily  - no ? blocker w/ low output - hold off on ARNi and SGLT2i  until post-op  - will hold off on GDMT up titration today given tachyarrhythmia, and risk for hypotension     2. 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 Likely angina w/ rapid AFL (see plan below) - ASA + statin  - no ? blocker w/ low output  4. SVT/ AFL - V-rates 150s - start amio gtt w/ bolus  - start heparin gtt - keep K > 4.0, Mg > 2.0  - will likely need MAZE at time of CABG/MVR    5. Type 2DM  - Hgb A1c 7.8  - SSI  6. AKI  - scr bump 0.86>>1.02>>1.00  - likely 2/2 diuresis +/- contrast  - CO now supported w/ milrinone. BP stable   - follow closely w/ diuresis    Length of Stay: 2  Robbie Lis, PA-C  08/30/2023, 7:50 AM  Advanced Heart Failure Team Pager [phone] (M-F; 7a - 5p)  Please contact [facility] for night-coverage after hours (5p -7a ) and weekends on [email or web address]   Patient seen and examined with the above-signed Advanced Practice Provider and/or Housestaff. I personally reviewed laboratory data, imaging studies and relevant notes. I independently examined the patient and formulated the important aspects of the plan. I have edited the note to reflect any of my changes or salient points. I have personally discussed the plan with the patient and/or family.  Remains on milrinone 0.125. Co-ox 72% CVP 10-12.  Developed AFL overnight with rates 140-150. Now on amio gtt and heparin.   General:  Sitting in chair No resp difficulty HEENT: normal Neck: supple. no JVD. Carotids 2+ bilat; no bruits. No lymphadenopathy or  thryomegaly appreciated. Cor: PMI nondisplaced. Irregular tachy  2/6 MR Lungs: clear Abdomen: obese soft, nontender, nondistended. No hepatosplenomegaly. No bruits or masses. Good bowel sounds. Extremities: no cyanosis, clubbing, rash, edema Neuro: alert & orientedx3, cranial nerves grossly intact. moves all 4 extremities w/o difficulty. Affect pleasant  Plan: 1. Continue amio/heparin 2. Continue diuresis 3. Stop milrinone 4. Pre-op TEE tomorrow to look at MV 5. Ambulate/IS  Arvilla Meres, MD  9:22 AM

## 2023-08-30 NOTE — H&P (View-Only) (Signed)
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   Remains on milrinone 0.125. Co-ox 72%   In SVT/ ? AFL this morning, rates 150s. Symptomatic w/ "indigestion", feels hot/flushed. Denies dyspnea. Currently normotensive.   1.9L in UOP yesterday. Wt continues to trend down. RN recorded CVP ~6 overnight, but reading ~12 on my exam.   SCr stable, 1.0. K 3.5, Mg 2.2. SBPs 110s on oral GDMT.    Objective:   Weight Range: 93 kg Body mass index is 35.19 kg/m.   Vital Signs:   Temp:  [98 F (36.7 C)-98.7 F (37.1 C)] 98 F (36.7 C) (11/13 0325) Pulse Rate:  [77-114] 77 (11/13 0600) Resp:  [14-30] 21 (11/13 0600) BP: (96-143)/(51-111) 118/59 (11/13 0600) SpO2:  [87 %-98 %] 95 % (11/13 0600) Weight:  [93 kg-93.6 kg] 93 kg (11/13 0631) Last BM Date : 08/29/23  Weight change: Filed Weights   08/29/23 0400 08/29/23 1259 08/30/23 0631  Weight: 94 kg 93.6 kg 93 kg    Intake/Output:   Intake/Output Summary (Last 24 hours) at 08/30/2023 0750 Last data filed at 08/30/2023 0400 Gross per 24 hour  Intake 256.27 ml  Output 1875 ml  Net -1618.73 ml      Physical Exam    General:  fatigued appearing. No respiratory difficulty HEENT: normal Neck: supple. JVD assessment difficult. Carotids 2+ bilat; no bruits. No lymphadenopathy or thyromegaly appreciated. Cor: PMI nondisplaced. Irregularly irregular rhythm and tachy rate. No rubs, gallops or murmurs. Lungs: decreased BS at the bases  Abdomen: soft, nontender, nondistended. No hepatosplenomegaly. No bruits or masses. Good bowel sounds. Extremities: no cyanosis, clubbing, rash, trace b/l LE edema Neuro: alert & oriented x 3, cranial nerves grossly intact. moves all 4 extremities w/o difficulty. Affect pleasant.  Telemetry   SVT ? AFL 150s, personally reviewed    EKG    12  lead ordered   Labs    CBC Recent Labs    08/29/23 0335 08/30/23 0355  WBC 8.4 7.2  HGB 10.2* 10.1*  HCT 32.2* 32.7*  MCV 80.3 80.3  PLT 305 299   Basic Metabolic Panel Recent Labs    10/62/69 0335 08/30/23 0355  NA 136 133*  K 3.5 3.5  CL 98 97*  CO2 29 27  GLUCOSE 176* 259*  BUN 15 21  CREATININE 1.02*  1.09* 1.00  CALCIUM 8.8* 8.3*  MG 1.4* 2.2   Liver Function Tests Recent Labs    08/28/23 1435  AST 17  ALT 21  ALKPHOS 37*  BILITOT 0.5  PROT 7.0  ALBUMIN 3.6   No results for input(s): "LIPASE", "AMYLASE" in the last 72 hours. Cardiac Enzymes No results for input(s): "CKTOTAL", "CKMB", "CKMBINDEX", "TROPONINI" in the last 72 hours.  BNP: BNP (last 3 results) No results for input(s): "BNP" in the last 8760 hours.  ProBNP (last 3 results) No results for input(s): "PROBNP" in the last 8760 hours.   D-Dimer No results for input(s): "DDIMER" in the last 72 hours. Hemoglobin A1C Recent Labs    08/29/23 0335  HGBA1C 7.8*   Fasting Lipid Panel No results for input(s): "CHOL", "HDL", "LDLCALC", "TRIG", "CHOLHDL", "LDLDIRECT" in the last 72 hours. Thyroid Function Tests No results for input(s): "TSH", "T4TOTAL", "T3FREE", "THYROIDAB" in the last 72 hours.  Invalid input(s): "FREET3"  Other results:   Imaging  DG Orthopantogram  Result Date: 08/29/2023 CLINICAL DATA:  Cardiac surgery planning. Assess for dental or periodontal disease. EXAM: ORTHOPANTOGRAM/PANORAMIC COMPARISON:  None Available. FINDINGS: Residual right maxillary molar with filling. 3 residual maxillary incisors. Seven residual anterior mandibular teeth, 2 with fillings. No evidence of any retained root fragment elsewhere. No evidence of active periodontal disease. IMPRESSION: No evidence of active dental or periodontal disease. Residual teeth as above. Electronically Signed   By: Paulina Fusi M.D.   On: 08/29/2023 15:46     Medications:     Scheduled Medications:  aspirin  EC  81 mg Oral Daily   Chlorhexidine Gluconate Cloth  6 each Topical Daily   Chlorhexidine Gluconate Cloth  6 each Topical Daily   enoxaparin (LOVENOX) injection  40 mg Subcutaneous Q24H   ezetimibe  10 mg Oral Daily   furosemide  40 mg Intravenous BID   gabapentin  100 mg Oral QHS   insulin aspart  0-15 Units Subcutaneous TID WC   insulin aspart  0-5 Units Subcutaneous QHS   isosorbide dinitrate  30 mg Oral TID   loratadine  10 mg Oral Daily   losartan  25 mg Oral Daily   pantoprazole  40 mg Oral Daily   rosuvastatin  40 mg Oral Daily   sodium chloride flush  10-40 mL Intracatheter Q12H   sodium chloride flush  3 mL Intravenous Q12H   spironolactone  12.5 mg Oral Daily    Infusions:  milrinone 0.125 mcg/kg/min (08/30/23 0400)    PRN Medications: acetaminophen, nitroGLYCERIN, ondansetron (ZOFRAN) IV, mouth rinse, sodium chloride flush, sodium chloride flush    Patient Profile   77 y/o female w/ h/o CAD s/p remote LAD and RCA PCI in 2011, Type 2DM and HLD, recently diagnosed w/ new systolic heart failure and severe MR by echo. 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 AHF team consulted for HF optimization prior to potential CABG + MVR.  Assessment/Plan   1. Acute Systolic Heart Failure w/ Low Output  - new. Prior Echo 2014 w/ normal EF 50-55%, normal RV - Echo 11/24 EF 25-30%, severe MR, RV low normal  - Ischemic CM. LHC w/ severe MVCAD. Planning CABG 11/20  - RHC c/w decompensated heart failure, severe LV dysfunction, and severe mitral regurgitation (RA 21, PAP 83/37/56, PCWP 36, CO 4.11, CI 2.04) - on milrinone 0.125 mcg/kg/min. Co-ox 72%. Volume overall improved w/ diuresis but starting to trend back up now in AFL. SCr/BP stable - continue IV Lasix 40 mg bid  - continue spiro 12.5 mg daily  - continue losartan 12.5 mg daily  - no ? blocker w/ low output - hold off on ARNi and SGLT2i  until post-op  - will hold off on GDMT up titration today given tachyarrhythmia, and risk for hypotension     2. 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 Likely angina w/ rapid AFL (see plan below) - ASA + statin  - no ? blocker w/ low output  4. SVT/ AFL - V-rates 150s - start amio gtt w/ bolus  - start heparin gtt - keep K > 4.0, Mg > 2.0  - will likely need MAZE at time of CABG/MVR    5. Type 2DM  - Hgb A1c 7.8  - SSI  6. AKI  - scr bump 0.86>>1.02>>1.00  - likely 2/2 diuresis +/- contrast  - CO now supported w/ milrinone. BP stable   - follow closely w/ diuresis    Length of Stay: 2  Robbie Lis, PA-C  08/30/2023, 7:50 AM  Advanced Heart Failure Team Pager [phone] (M-F; 7a - 5p)  Please contact [facility] for night-coverage after hours (5p -7a ) and weekends on [email or web address]   Patient seen and examined with the above-signed Advanced Practice Provider and/or Housestaff. I personally reviewed laboratory data, imaging studies and relevant notes. I independently examined the patient and formulated the important aspects of the plan. I have edited the note to reflect any of my changes or salient points. I have personally discussed the plan with the patient and/or family.  Remains on milrinone 0.125. Co-ox 72% CVP 10-12.  Developed AFL overnight with rates 140-150. Now on amio gtt and heparin.   General:  Sitting in chair No resp difficulty HEENT: normal Neck: supple. no JVD. Carotids 2+ bilat; no bruits. No lymphadenopathy or  thryomegaly appreciated. Cor: PMI nondisplaced. Irregular tachy  2/6 MR Lungs: clear Abdomen: obese soft, nontender, nondistended. No hepatosplenomegaly. No bruits or masses. Good bowel sounds. Extremities: no cyanosis, clubbing, rash, edema Neuro: alert & orientedx3, cranial nerves grossly intact. moves all 4 extremities w/o difficulty. Affect pleasant  Plan: 1. Continue amio/heparin 2. Continue diuresis 3. Stop milrinone 4. Pre-op TEE tomorrow to look at MV 5. Ambulate/IS  Arvilla Meres, MD  9:22 AM

## 2023-08-30 NOTE — Progress Notes (Signed)
PHARMACY - ANTICOAGULATION CONSULT NOTE  Pharmacy Consult for heparin Indication:  atrial flutter  Allergies  Allergen Reactions   Lisinopril Cough   Sulfa Antibiotics Hives    Patient Measurements: Height: 5\' 4"  (162.6 cm) Weight: 93 kg (205 lb 0.4 oz) IBW/kg (Calculated) : 54.7 Heparin Dosing Weight: 77 kg   Vital Signs: Temp: 98.1 F (36.7 C) (11/13 1629) Temp Source: Oral (11/13 1629) BP: 164/84 (11/13 1830) Pulse Rate: 97 (11/13 1830)  Labs: Recent Labs    08/28/23 1435 08/29/23 0335 08/30/23 0355 08/30/23 1715  HGB 10.7* 10.2* 10.1*  --   HCT 33.8* 32.2* 32.7*  --   PLT 351 305 299  --   HEPARINUNFRC  --   --   --  0.59  CREATININE 0.86 1.02*  1.09* 1.00  --     Estimated Creatinine Clearance: 53.7 mL/min (by C-G formula based on SCr of 1 mg/dL).   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    Dyslipidemia    HTN (hypertension)     Medications:  Scheduled:   aspirin EC  81 mg Oral Daily   Chlorhexidine Gluconate Cloth  6 each Topical Daily   Chlorhexidine Gluconate Cloth  6 each Topical Daily   ezetimibe  10 mg Oral Daily   furosemide  40 mg Intravenous BID   gabapentin  100 mg Oral QHS   insulin aspart  0-15 Units Subcutaneous TID WC   insulin aspart  0-5 Units Subcutaneous QHS   insulin glargine-yfgn  18 Units Subcutaneous QHS   loratadine  10 mg Oral Daily   pantoprazole  40 mg Oral Daily   potassium chloride  40 mEq Oral BID   rosuvastatin  40 mg Oral Daily   sodium chloride flush  10-40 mL Intracatheter Q12H   sodium chloride flush  3 mL Intravenous Q12H   spironolactone  12.5 mg Oral Daily    Assessment: 77 yof presenting for HF optimization after outpatient cath found 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 Now went into Aflutter 11/13 - no AC PTA.  Hgb 10.1, plt 299. On enoxaparin 40 mg  for DVT ppx (LD 11/13@0820 ). No s/sx of bleeding.   11/13 PM: heparin level 0.59 on heparin 1100 units/hr. No issues with the infusion or bleeding noted per RN.  Goal of Therapy:  Heparin level 0.3-0.7 units/ml Monitor platelets by anticoagulation protocol: Yes   Plan:  Continue heparin drip at 1100 units/hr Check confirmatory heparin level in 8hrs and daily while on heparin Continue to monitory H&H and platelets  Thank you for allowing pharmacy to participate in this patient's care,  Loralee Pacas, PharmD, BCPS Clinical Pharmacist  Phone: [phone] 08/30/2023 6:57 PM  Please check AMION for all [facility] Pharmacy phone numbers After 10:00 PM, call Main Pharmacy [phone]

## 2023-08-31 ENCOUNTER — Encounter (HOSPITAL_COMMUNITY)
Admission: AD | Disposition: A | Discharge status: Home Health | Payer: Self-pay | Source: Ambulatory Visit | Attending: Thoracic Surgery (Cardiothoracic Vascular Surgery)

## 2023-08-31 ENCOUNTER — Encounter (HOSPITAL_COMMUNITY): Payer: Self-pay | Admitting: Internal Medicine

## 2023-08-31 ENCOUNTER — Inpatient Hospital Stay (HOSPITAL_COMMUNITY): Payer: Medicare HMO | Admitting: Anesthesiology

## 2023-08-31 ENCOUNTER — Inpatient Hospital Stay (HOSPITAL_COMMUNITY): Payer: Medicare HMO

## 2023-08-31 DIAGNOSIS — I340 Nonrheumatic mitral (valve) insufficiency: Secondary | ICD-10-CM

## 2023-08-31 DIAGNOSIS — I5023 Acute on chronic systolic (congestive) heart failure: Secondary | ICD-10-CM | POA: Diagnosis not present

## 2023-08-31 HISTORY — PX: TRANSESOPHAGEAL ECHOCARDIOGRAM (CATH LAB): EP1270

## 2023-08-31 LAB — BASIC METABOLIC PANEL
Anion gap: 8 (ref 5–15)
BUN: 19 mg/dL (ref 8–23)
CO2: 27 mmol/L (ref 22–32)
Calcium: 8.3 mg/dL — ABNORMAL LOW (ref 8.9–10.3)
Chloride: 94 mmol/L — ABNORMAL LOW (ref 98–111)
Creatinine, Ser: 1.42 mg/dL — ABNORMAL HIGH (ref 0.44–1.00)
GFR, Estimated: 39 mL/min — ABNORMAL LOW (ref >60)
Glucose, Bld: 274 mg/dL — ABNORMAL HIGH (ref 70–99)
Potassium: 4.5 mmol/L (ref 3.5–5.1)
Sodium: 129 mmol/L — ABNORMAL LOW (ref 135–145)

## 2023-08-31 LAB — CBC
HCT: 35.9 % — ABNORMAL LOW (ref 36.0–46.0)
Hemoglobin: 11.5 g/dL — ABNORMAL LOW (ref 12.0–15.0)
MCH: 25.7 pg — ABNORMAL LOW (ref 26.0–34.0)
MCHC: 32 g/dL (ref 30.0–36.0)
MCV: 80.3 fL (ref 80.0–100.0)
Platelets: 330 10*3/uL (ref 150–400)
RBC: 4.47 MIL/uL (ref 3.87–5.11)
RDW: 15.4 % (ref 11.5–15.5)
WBC: 11 10*3/uL — ABNORMAL HIGH (ref 4.0–10.5)
nRBC: 0 % (ref 0.0–0.2)

## 2023-08-31 LAB — COOXEMETRY PANEL
Carboxyhemoglobin: 1.8 % — ABNORMAL HIGH (ref 0.5–1.5)
Methemoglobin: <0.7 % (ref 0.0–1.5)
O2 Saturation: 64.4 %
Total hemoglobin: 12 g/dL (ref 12.0–16.0)

## 2023-08-31 LAB — GLUCOSE, CAPILLARY
Glucose-Capillary: 266 mg/dL — ABNORMAL HIGH (ref 70–99)
Glucose-Capillary: 219 mg/dL — ABNORMAL HIGH (ref 70–99)
Glucose-Capillary: 313 mg/dL — ABNORMAL HIGH (ref 70–99)

## 2023-08-31 LAB — HEPARIN LEVEL (UNFRACTIONATED): Heparin Unfractionated: 0.67 [IU]/mL (ref 0.30–0.70)

## 2023-08-31 LAB — MAGNESIUM: Magnesium: 1.9 mg/dL (ref 1.7–2.4)

## 2023-08-31 SURGERY — TRANSESOPHAGEAL ECHOCARDIOGRAM (TEE) (CATHLAB)
Anesthesia: Monitor Anesthesia Care

## 2023-08-31 MED ORDER — PROPOFOL 500 MG/50ML IV EMUL
INTRAVENOUS | Status: DC | PRN
  Administered 2023-08-31: 70 ug/kg/min via INTRAVENOUS

## 2023-08-31 MED ORDER — PHENYLEPHRINE HCL-NACL 20-0.9 MG/250ML-% IV SOLN
INTRAVENOUS | Status: DC | PRN
  Administered 2023-08-31: 40 ug/min via INTRAVENOUS

## 2023-08-31 MED ORDER — LIDOCAINE HCL 4 % EX SOLN
CUTANEOUS | Status: DC | PRN
  Administered 2023-08-31: 3 mL via TOPICAL

## 2023-08-31 MED ORDER — LIDOCAINE 2% (20 MG/ML) 5 ML SYRINGE
INTRAMUSCULAR | Status: DC | PRN
  Administered 2023-08-31: 30 mg via INTRAVENOUS

## 2023-08-31 MED ORDER — PHENYLEPHRINE 80 MCG/ML (10ML) SYRINGE FOR IV PUSH (FOR BLOOD PRESSURE SUPPORT)
PREFILLED_SYRINGE | INTRAVENOUS | Status: DC | PRN
  Administered 2023-08-31 (×6): 80 ug via INTRAVENOUS

## 2023-08-31 MED ORDER — PROPOFOL 10 MG/ML IV BOLUS
INTRAVENOUS | Status: DC | PRN
  Administered 2023-08-31: 40 mg via INTRAVENOUS

## 2023-08-31 MED ORDER — SODIUM CHLORIDE 0.9 % IV SOLN: INTRAVENOUS | Status: DC | PRN

## 2023-08-31 MED ORDER — INSULIN ASPART 100 UNIT/ML IJ SOLN
3.0000 [IU] | Freq: Three times a day (TID) | INTRAMUSCULAR | Status: DC
  Administered 2023-08-31 – 2023-09-01 (×2): 3 [IU] via SUBCUTANEOUS

## 2023-08-31 MED ORDER — SODIUM CHLORIDE 0.9 % IV SOLN: INTRAVENOUS | Status: DC

## 2023-08-31 NOTE — Progress Notes (Signed)
PHARMACY - ANTICOAGULATION CONSULT NOTE  Pharmacy Consult for heparin Indication:  atrial flutter  Allergies  Allergen Reactions   Lisinopril Cough   Sulfa Antibiotics Hives    Patient Measurements: Height: 5\' 4"  (162.6 cm) Weight: 99.3 kg (218 lb 14.7 oz) IBW/kg (Calculated) : 54.7 Heparin Dosing Weight: 77 kg   Vital Signs: Temp: 98.1 F (36.7 C) (11/14 1322) Temp Source: Temporal (11/14 1322) BP: 104/63 (11/14 1330) Pulse Rate: 87 (11/14 1600)  Labs: Recent Labs    08/29/23 0335 08/30/23 0355 08/30/23 1715 08/31/23 0319  HGB 10.2* 10.1*  --  11.5*  HCT 32.2* 32.7*  --  35.9*  PLT 305 299  --  330  HEPARINUNFRC  --   --  0.59 0.67  CREATININE 1.02*  1.09* 1.00  --  1.42*    Estimated Creatinine Clearance: 39.2 mL/min (A) (by C-G formula based on SCr of 1.42 mg/dL (H)).   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    Dyslipidemia    HTN (hypertension)     Medications:  Scheduled:   aspirin EC  81 mg Oral Daily   Chlorhexidine Gluconate Cloth  6 each Topical Daily   Chlorhexidine Gluconate Cloth  6 each Topical Daily   ezetimibe  10 mg Oral Daily   gabapentin  100 mg Oral QHS   insulin aspart  0-15 Units Subcutaneous TID WC   insulin aspart  0-5 Units Subcutaneous QHS   insulin aspart  3 Units Subcutaneous TID WC   insulin glargine-yfgn  18 Units Subcutaneous QHS   loratadine  10 mg Oral Daily   pantoprazole  40 mg Oral Daily   rosuvastatin  40 mg Oral Daily   sodium chloride flush  10-40 mL Intracatheter Q12H   sodium chloride flush  3 mL Intravenous Q12H   spironolactone  12.5 mg Oral Daily    Assessment: 77 yof presenting for HF optimization after outpatient cath found 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 Now went into Aflutter 11/13 - no AC PTA.  Hgb stable 10-11 plt stable 300s.  heparin  level 0.67 at goal  on heparin 1100 units/hr. No issues with the infusion or bleeding noted per RN.  Goal of Therapy:  Heparin level 0.3-0.7 units/ml Monitor platelets by anticoagulation protocol: Yes   Plan:  Continue heparin drip at 1100 units/hr Check confirmatory heparin level in 8hrs and daily while on heparin Continue to monitory H&H and platelets   Leota Sauers Pharm.D. CPP, BCPS Clinical Pharmacist [phone] 08/31/2023 4:18 PM    Please check AMION for all [facility] Pharmacy phone numbers After 10:00 PM, call Main Pharmacy [phone]

## 2023-08-31 NOTE — Interval H&P Note (Signed)
History and Physical Interval Note:  08/31/2023 12:50 PM  Diana [name]  has presented today for surgery, with the diagnosis of MITRAL REGURGITATION.  The various methods of treatment have been discussed with the patient and family. After consideration of risks, benefits and other options for treatment, the patient has consented to  Procedure(s): TRANSESOPHAGEAL ECHOCARDIOGRAM (N/A) as a surgical intervention.  The patient's history has been reviewed, patient examined, no change in status, stable for surgery.  I have reviewed the patient's chart and labs.  Questions were answered to the patient's satisfaction.     [name]

## 2023-08-31 NOTE — Anesthesia Postprocedure Evaluation (Signed)
Anesthesia Post Note  Patient: Diana [name]  Procedure(s) Performed: TRANSESOPHAGEAL ECHOCARDIOGRAM     Patient location during evaluation: Cath Lab Anesthesia Type: MAC Level of consciousness: awake and alert, patient cooperative and oriented Pain management: pain level controlled Vital Signs Assessment: post-procedure vital signs reviewed and stable Respiratory status: nonlabored ventilation, spontaneous breathing and respiratory function stable Cardiovascular status: blood pressure returned to baseline and stable Postop Assessment: no apparent nausea or vomiting and adequate PO intake Anesthetic complications: no   No notable events documented.  Last Vitals:  Vitals:   08/31/23 1146 08/31/23 1322  BP: 134/72   Pulse: 93   Resp: (!) 24   Temp: 36.8 C 36.7 C  SpO2: 98%     Last Pain:  Vitals:   08/31/23 1322  TempSrc: Temporal  PainSc: 0-No pain                 Aradhya Shellenbarger,E. Taeler Winning

## 2023-08-31 NOTE — Anesthesia Preprocedure Evaluation (Addendum)
Anesthesia Evaluation  Patient identified by MRN, date of birth, ID band Patient awake    Reviewed: Allergy & Precautions, NPO status , Patient's Chart, lab work & pertinent test results  History of Anesthesia Complications Negative for: history of anesthetic complications  Airway Mallampati: I  TM Distance: >3 FB Neck ROM: Full    Dental  (+) Partial Upper, Missing, Dental Advisory Given   Pulmonary neg pulmonary ROS   breath sounds clear to auscultation       Cardiovascular hypertension, Pt. on medications + angina  + CAD and + Cardiac Stents   Rhythm:Regular Rate:Normal  08/28/2023 cath:  1.  Severe multivessel coronary artery disease with total occlusion of the RCA, severe stenosis of the distal left main, subtotal occlusion of the ostial circumflex, and severe stenosis of both the mid LAD and first diagonal at the bifurcation point 2.  Known severe MR by echo assessment confirmed by invasive hemodynamics with giant V waves of greater than 60 mmHg 3.  Severe pulmonary hypertension with PA pressure 83/37 mmHg with a mean of 56 mmHg, transpulmonary gradient of 20 mmHg  08/24/2023 ECHO: EF  25 to 30%.  1. The LV has severely decreased function, global hypokinesis. The left ventricular internal cavity size was mildly dilated. There is severe akinesis of the left ventricular, entire inferior wall.   2. RVF is low normal. The right ventricular size is normal. There is moderately elevated pulmonary artery systolic pressure. The estimated right ventricular systolic pressure is 59.3 mmHg.   3. Left atrial size was moderately dilated.   4. Suspect ischemic MR with posterior leaflet tethering. The mitral valve is abnormal. Severe mitral valve regurgitation.   5. The aortic valve is tricuspid. Aortic valve regurgitation is trivial. Aortic valve sclerosis/calcification is present, without any evidence of aortic stenosis.       Neuro/Psych negative neurological ROS     GI/Hepatic Neg liver ROS,GERD  Medicated and Controlled,,  Endo/Other  diabetes (glu 266), Insulin Dependent, Oral Hypoglycemic Agents  Class 3 obesity (BMI 37.6)  Renal/GU Renal InsufficiencyRenal disease     Musculoskeletal   Abdominal   Peds  Hematology Hb 11.5, plt 330k   Anesthesia Other Findings   Reproductive/Obstetrics                             Anesthesia Physical Anesthesia Plan  ASA: 4  Anesthesia Plan: MAC   Post-op Pain Management: Minimal or no pain anticipated   Induction:   PONV Risk Score and Plan: 2 and Treatment may vary due to age or medical condition  Airway Management Planned: Natural Airway and Nasal Cannula  Additional Equipment: None  Intra-op Plan:   Post-operative Plan:   Informed Consent: I have reviewed the patients History and Physical, chart, labs and discussed the procedure including the risks, benefits and alternatives for the proposed anesthesia with the patient or authorized representative who has indicated his/her understanding and acceptance.     Dental advisory given  Plan Discussed with: CRNA and Surgeon  Anesthesia Plan Comments:        Anesthesia Quick Evaluation

## 2023-08-31 NOTE — Progress Notes (Signed)
  Echocardiogram Echocardiogram Transesophageal has been performed.  Diana [name] 08/31/2023, 1:24 PM

## 2023-08-31 NOTE — Progress Notes (Addendum)
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  11/13: went into SVT 150s, appeared AT. Started on amio + heparin gtt. Milrinone discontinued     Converted back to NSR on amio gtt. Remains in NSR. No recurrence overnight. Continues on amio gtt at 30/hr. On heparin gtt.   Stable off milrinone. Co-ox 64%.   Scr is up 1.00>>1.42 but suspect 2/2 transient hypotension w/ SVT yesterday. BP improved, systolic's in the 120s. C/w good UOP, 3L yesterday. CVP 4   K 4.5 Mg 1.9   Hyperglycemic, fasting Gluc 270s. Corrected Na 133.  Sitting up in bed. Feels much better. Denies CP. No dyspnea.    Objective:   Weight Range: 99.3 kg Body mass index is 37.58 kg/m.   Vital Signs:   Temp:  [97.8 F (36.6 C)-98.9 F (37.2 C)] 98.9 F (37.2 C) (11/14 0400) Pulse Rate:  [74-181] 83 (11/14 0700) Resp:  [14-36] 27 (11/14 0700) BP: (85-164)/(53-93) 128/66 (11/14 0700) SpO2:  [91 %-99 %] 96 % (11/14 0700) Weight:  [99.3 kg] 99.3 kg (11/14 0500) Last BM Date : 08/30/23  Weight change: Filed Weights   08/29/23 1259 08/30/23 0631 08/31/23 0500  Weight: 93.6 kg 93 kg 99.3 kg    Intake/Output:   Intake/Output Summary (Last 24 hours) at 08/31/2023 0751 Last data filed at 08/31/2023 0701 Gross per 24 hour  Intake 926.84 ml  Output 2950 ml  Net -2023.16 ml      Physical Exam    CVP 4  General:  Well appearing, mod obese. No respiratory difficulty HEENT: normal Neck: supple. JVD 5-6 cm. LIJ CVC, Carotids 2+ bilat; no bruits. No lymphadenopathy or thyromegaly appreciated. Cor: PMI nondisplaced. Regular rate & rhythm. No rubs, gallops or murmurs. Lungs: clear Abdomen: soft, nontender, nondistended. No hepatosplenomegaly. No bruits or masses. Good bowel sounds. Extremities: no cyanosis, clubbing, rash, trace b/l LE edema + TEDs   Neuro: alert & oriented x 3, cranial nerves grossly intact. moves all 4 extremities w/o difficulty. Affect pleasant.   Telemetry   NSR 80s, no further SVT/AT overnight  personally reviewed    EKG   N/A   Labs    CBC Recent Labs    08/30/23 0355 08/31/23 0319  WBC 7.2 11.0*  HGB 10.1* 11.5*  HCT 32.7* 35.9*  MCV 80.3 80.3  PLT 299 330   Basic Metabolic Panel Recent Labs    16/10/96 0355 08/31/23 0319  NA 133* 129*  K 3.5 4.5  CL 97* 94*  CO2 27 27  GLUCOSE 259* 274*  BUN 21 19  CREATININE 1.00 1.42*  CALCIUM 8.3* 8.3*  MG 2.2 1.9   Liver Function Tests Recent Labs    08/28/23 1435  AST 17  ALT 21  ALKPHOS 37*  BILITOT 0.5  PROT 7.0  ALBUMIN 3.6   No results for input(s): "LIPASE", "AMYLASE" in the last 72 hours. Cardiac Enzymes No results for input(s): "CKTOTAL", "CKMB", "CKMBINDEX", "TROPONINI" in the last 72 hours.  BNP: BNP (last 3 results) No results for input(s): "BNP" in the last 8760 hours.  ProBNP (last 3 results) No results for input(s): "PROBNP" in the last 8760 hours.   D-Dimer No results for input(s): "DDIMER" in the last 72 hours. Hemoglobin A1C Recent Labs    08/29/23 0335  HGBA1C 7.8*   Fasting Lipid Panel No  results for input(s): "CHOL", "HDL", "LDLCALC", "TRIG", "CHOLHDL", "LDLDIRECT" in the last 72 hours. Thyroid Function Tests No results for input(s): "TSH", "T4TOTAL", "T3FREE", "THYROIDAB" in the last 72 hours.  Invalid input(s): "FREET3"  Other results:   Imaging    No results found.   Medications:     Scheduled Medications:  aspirin EC  81 mg Oral Daily   Chlorhexidine Gluconate Cloth  6 each Topical Daily   Chlorhexidine Gluconate Cloth  6 each Topical Daily   ezetimibe  10 mg Oral Daily   furosemide  40 mg Intravenous BID   gabapentin  100 mg Oral QHS   insulin aspart  0-15 Units Subcutaneous TID WC   insulin aspart  0-5 Units Subcutaneous QHS   insulin glargine-yfgn  18 Units Subcutaneous QHS    loratadine  10 mg Oral Daily   pantoprazole  40 mg Oral Daily   rosuvastatin  40 mg Oral Daily   sodium chloride flush  10-40 mL Intracatheter Q12H   sodium chloride flush  3 mL Intravenous Q12H   spironolactone  12.5 mg Oral Daily    Infusions:  amiodarone 30 mg/hr (08/31/23 0701)   heparin 1,100 Units/hr (08/31/23 0701)   norepinephrine (LEVOPHED) Adult infusion Stopped (08/30/23 1131)    PRN Medications: acetaminophen, nitroGLYCERIN, ondansetron (ZOFRAN) IV, mouth rinse, sodium chloride flush, sodium chloride flush    Patient Profile   77 y/o female w/ h/o CAD s/p remote LAD and RCA PCI in 2011, Type 2DM and HLD, recently diagnosed w/ new systolic heart failure and severe MR by echo. 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 AHF team consulted for HF optimization prior to potential CABG + MVR.  Assessment/Plan   1. Acute Systolic Heart Failure w/ Low Output  - new. Prior Echo 2014 w/ normal EF 50-55%, normal RV - Echo 11/24 EF 25-30%, severe MR, RV low normal  - Ischemic CM. LHC w/ severe MVCAD. Planning CABG 11/20  - RHC c/w decompensated heart failure, severe LV dysfunction, and severe mitral regurgitation (RA 21, PAP 83/37/56, PCWP 36, CO 4.11, CI 2.04)>>placed on milrinone 0.125 and diuresed w/ IV Lasix  - volume status improved, CVP 4  - now off milrinone w/ tachy artrial arrhythmias  - co-ox stable 64%  - hold IV Lasix today  - continue spiro 12.5 mg daily  - restart losartan 12.5 mg daily  - no ? blocker w/ recent low output - hold off on ARNi and SGLT2i until post-op     2. 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 blocker w/ low output   4. SVT/ AT  - converted to NSR on amio gtt  - continue amio gtt at 30/hr  - continue heparin gtt - keep K > 4.0, Mg > 2.0  - ? MAZE at time of CABG/MVR    5. Type 2DM  - Hgb A1c 7.8  - SSI  6. AKI  - scr  bump 0.86>>1.02>>1.00>>1.42  - likely 2/2 diuresis + transient hypotension w/ SVT yesterday  - CO ok w/ Co-ox 64%. BP now normotensive. CVP 4 - hold IV Lasix today and follow BMP    Length of Stay: 3  Brittainy Simmons, PA-C  08/31/2023, 7:51 AM  Advanced Heart Failure Team Pager [phone] (M-F; 7a - 5p)  Please contact [facility] for night-coverage after hours (5p -7a ) and weekends on [email or web address]  Patient seen and examined with the above-signed Advanced Practice Provider and/or Housestaff. I personally reviewed laboratory data, imaging studies and relevant notes. I independently examined the patient and formulated the important aspects of the plan. I have edited the note to reflect any of my changes or salient points. I have personally discussed the plan with the patient and/or family.  Back in NSR on IV amio. Feels ok. No CP or SOB. Co-ox 64%  TEE today with EF 25% severe RV dysfunction. 3-4+ MR  General:  Sitting up in bed . No resp difficulty HEENT: normal Neck: supple. no JVD. Carotids 2+ bilat; no bruits. No lymphadenopathy or thryomegaly appreciated. Cor: PMI nondisplaced. Regular rate & rhythm.2/6 MR Lungs: clear Abdomen: obese soft, nontender, nondistended. No hepatosplenomegaly. No bruits or masses. Good bowel sounds. Extremities: no cyanosis, clubbing, rash, edema Neuro: alert & orientedx3, cranial nerves grossly intact. moves all 4 extremities w/o difficulty. Affect pleasant  Back in sinus.   She has severe biventricular dysfunction with 3-4+ MR. Discussed that she  would be high-risk for CABG/MVR.   Continue amio/heparin  I will d/w Dr. Dorris Fetch.   Arvilla Meres, MD  4:02 PM

## 2023-08-31 NOTE — CV Procedure (Signed)
    TRANSESOPHAGEAL ECHOCARDIOGRAM   NAME:  Diana [name]   MRN: [id number] DOB:  06/21/1946   ADMIT DATE: 08/28/2023  INDICATIONS:  Mitral regurgitation  PROCEDURE:   Informed consent was obtained prior to the procedure. The risks, benefits and alternatives for the procedure were discussed and the patient comprehended these risks.  Risks include, but are not limited to, cough, sore throat, vomiting, nausea, somnolence, esophageal and stomach trauma or perforation, bleeding, low blood pressure, aspiration, pneumonia, infection, trauma to the teeth and death.    After a procedural time-out, the patient was sedated by the anesthesia service. Once an appropriate level of sedation was achieved, the transesophageal probe was inserted in the esophagus and stomach without difficulty and multiple views were obtained.    COMPLICATIONS:    There were no immediate complications.  FINDINGS:  LEFT VENTRICLE: EF = 25%.  RIGHT VENTRICLE: Severely HK  LEFT ATRIUM: Severely dilated  LEFT ATRIAL APPENDAGE: No thrombus.   RIGHT ATRIUM: Severely  AORTIC VALVE:  Trileaflet. Mild calcification Trivial AI  MITRAL VALVE:    Posterior leaflet restricted 3-4+ MR with mild systolic reversal in PV flow  TRICUSPID VALVE: Normal. Mild TR  PULMONIC VALVE: Grossly normal. Triv PI  INTERATRIAL SEPTUM: No PFO or ASD.  PERICARDIUM: Trivial effusion  DESCENDING AORTA: Moderate plaque    Bee Hammerschmidt,MD 1:09 PM

## 2023-08-31 NOTE — Inpatient Diabetes Management (Signed)
Inpatient Diabetes Program Recommendations  AACE/ADA: New Consensus Statement on Inpatient Glycemic Control (2015)  Target Ranges:  Prepandial:   less than 140 mg/dL      Peak postprandial:   less than 180 mg/dL (1-2 hours)      Critically ill patients:  140 - 180 mg/dL   Lab Results  Component Value Date   GLUCAP 266 (H) 08/31/2023   HGBA1C 7.8 (H) 08/29/2023    Review of Glycemic Control  Latest Reference Range & Units 08/30/23 11:30 08/30/23 16:30 08/30/23 21:34 08/31/23 08:20  Glucose-Capillary 70 - 99 mg/dL 440 (H) 102 (H) 725 (H) 266 (H)  (H): Data is abnormally high Diabetes history: Type 2 DM Outpatient Diabetes medications: Novolog 70/30 60 units BID, Metformin 1000 mg BID, Ozempic 0.5 mg qwk Current orders for Inpatient glycemic control: Novolog 0-15 units TID & HS, Semglee 18 units at bedtime, Novolog 3 units TID   Inpatient Diabetes Program Recommendations:     Consider further increasing Semglee 18 units BID  Thanks, Lujean Rave, MSN, RNC-OB Diabetes Coordinator [phone] (8a-5p)

## 2023-08-31 NOTE — Transfer of Care (Signed)
Immediate Anesthesia Transfer of Care Note  Patient: JAKALYA MONES  Procedure(s) Performed: TRANSESOPHAGEAL ECHOCARDIOGRAM  Patient Location: PACU  Anesthesia Type:MAC  Level of Consciousness: awake, alert , and oriented  Airway & Oxygen Therapy: Patient Spontanous Breathing and Patient connected to nasal cannula oxygen  Post-op Assessment: Report given to RN and Post -op Vital signs reviewed and stable  Post vital signs: Reviewed and stable  Last Vitals:  Vitals Value Taken Time  BP    Temp 36.7 C 08/31/23 1322  Pulse 76 08/31/23 1322  Resp 24 08/31/23 1322  SpO2 97 % 08/31/23 1322  Vitals shown include unfiled device data.  Last Pain:  Vitals:   08/31/23 1322  TempSrc: Temporal  PainSc: 0-No pain         Complications: No notable events documented.

## 2023-09-01 DIAGNOSIS — I5023 Acute on chronic systolic (congestive) heart failure: Secondary | ICD-10-CM | POA: Diagnosis not present

## 2023-09-01 DIAGNOSIS — I340 Nonrheumatic mitral (valve) insufficiency: Secondary | ICD-10-CM | POA: Diagnosis not present

## 2023-09-01 LAB — BASIC METABOLIC PANEL
Anion gap: 10 (ref 5–15)
BUN: 17 mg/dL (ref 8–23)
CO2: 27 mmol/L (ref 22–32)
Calcium: 8.7 mg/dL — ABNORMAL LOW (ref 8.9–10.3)
Chloride: 97 mmol/L — ABNORMAL LOW (ref 98–111)
Creatinine, Ser: 0.95 mg/dL (ref 0.44–1.00)
GFR, Estimated: >60 mL/min (ref >60)
Glucose, Bld: 246 mg/dL — ABNORMAL HIGH (ref 70–99)
Potassium: 4.2 mmol/L (ref 3.5–5.1)
Sodium: 134 mmol/L — ABNORMAL LOW (ref 135–145)

## 2023-09-01 LAB — MAGNESIUM: Magnesium: 2.1 mg/dL (ref 1.7–2.4)

## 2023-09-01 LAB — GLUCOSE, CAPILLARY
Glucose-Capillary: 423 mg/dL — ABNORMAL HIGH (ref 70–99)
Glucose-Capillary: 252 mg/dL — ABNORMAL HIGH (ref 70–99)
Glucose-Capillary: 299 mg/dL — ABNORMAL HIGH (ref 70–99)
Glucose-Capillary: 335 mg/dL — ABNORMAL HIGH (ref 70–99)
Glucose-Capillary: 354 mg/dL — ABNORMAL HIGH (ref 70–99)

## 2023-09-01 LAB — COOXEMETRY PANEL
Carboxyhemoglobin: 1.6 % — ABNORMAL HIGH (ref 0.5–1.5)
Methemoglobin: <0.7 % (ref 0.0–1.5)
O2 Saturation: 62.4 %
Total hemoglobin: 11.7 g/dL — ABNORMAL LOW (ref 12.0–16.0)

## 2023-09-01 LAB — HEPARIN LEVEL (UNFRACTIONATED)
Heparin Unfractionated: 0.27 [IU]/mL — ABNORMAL LOW (ref 0.30–0.70)
Heparin Unfractionated: 0.2 [IU]/mL — ABNORMAL LOW (ref 0.30–0.70)

## 2023-09-01 MED ORDER — INSULIN GLARGINE-YFGN 100 UNIT/ML ~~LOC~~ SOLN
18.0000 [IU] | Freq: Two times a day (BID) | SUBCUTANEOUS | Status: DC
  Filled 2023-09-01 (×2): qty 0.18

## 2023-09-01 MED ORDER — SENNOSIDES-DOCUSATE SODIUM 8.6-50 MG PO TABS
2.0000 | ORAL_TABLET | Freq: Every day | ORAL | Status: DC
  Administered 2023-09-01 – 2023-09-04 (×2): 2 via ORAL
  Filled 2023-09-01 (×3): qty 2

## 2023-09-01 MED ORDER — POLYETHYLENE GLYCOL 3350 17 G PO PACK
17.0000 g | PACK | Freq: Every day | ORAL | Status: DC
  Administered 2023-09-03 – 2023-09-05 (×2): 17 g via ORAL
  Filled 2023-09-01 (×2): qty 1

## 2023-09-01 MED ORDER — INSULIN GLARGINE-YFGN 100 UNIT/ML ~~LOC~~ SOLN
18.0000 [IU] | Freq: Once | SUBCUTANEOUS | Status: AC
  Administered 2023-09-01: 18 [IU] via SUBCUTANEOUS
  Filled 2023-09-01: qty 0.18

## 2023-09-01 MED ORDER — INSULIN ASPART 100 UNIT/ML IJ SOLN
4.0000 [IU] | Freq: Three times a day (TID) | INTRAMUSCULAR | Status: DC
  Administered 2023-09-01 – 2023-09-03 (×7): 4 [IU] via SUBCUTANEOUS

## 2023-09-01 MED ORDER — INSULIN GLARGINE-YFGN 100 UNIT/ML ~~LOC~~ SOLN
18.0000 [IU] | Freq: Two times a day (BID) | SUBCUTANEOUS | Status: DC
  Filled 2023-09-01: qty 0.18

## 2023-09-01 MED ORDER — INSULIN GLARGINE-YFGN 100 UNIT/ML ~~LOC~~ SOLN
25.0000 [IU] | Freq: Two times a day (BID) | SUBCUTANEOUS | Status: DC
Start: 2023-09-01 — End: 2023-09-03
  Administered 2023-09-01 – 2023-09-03 (×4): 25 [IU] via SUBCUTANEOUS
  Filled 2023-09-01 (×5): qty 0.25

## 2023-09-01 NOTE — Progress Notes (Signed)
PHARMACY - ANTICOAGULATION CONSULT NOTE  Pharmacy Consult for heparin Indication:  atrial flutter  Allergies  Allergen Reactions   Lisinopril Cough   Sulfa Antibiotics Hives    Patient Measurements: Height: 5\' 4"  (162.6 cm) Weight: 92.3 kg (203 lb 8 oz) IBW/kg (Calculated) : 54.7 Heparin Dosing Weight: 77 kg   Vital Signs: Temp: 99 F (37.2 C) (11/15 0645) Temp Source: Oral (11/15 0645) BP: 119/101 (11/15 1400) Pulse Rate: 85 (11/15 1400)  Labs: Recent Labs    08/30/23 0355 08/30/23 1715 08/31/23 0319 09/01/23 0300 09/01/23 1051  HGB 10.1*  --  11.5*  --   --   HCT 32.7*  --  35.9*  --   --   PLT 299  --  330  --   --   HEPARINUNFRC  --    < > 0.67 0.27* 0.20*  CREATININE 1.00  --  1.42* 0.95  --    < > = values in this interval not displayed.    Estimated Creatinine Clearance: 56.3 mL/min (by C-G formula based on SCr of 0.95 mg/dL).   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    Dyslipidemia    HTN (hypertension)     Medications:  Scheduled:   aspirin EC  81 mg Oral Daily   Chlorhexidine Gluconate Cloth  6 each Topical Daily   Chlorhexidine Gluconate Cloth  6 each Topical Daily   ezetimibe  10 mg Oral Daily   gabapentin  100 mg Oral QHS   insulin aspart  0-15 Units Subcutaneous TID WC   insulin aspart  0-5 Units Subcutaneous QHS   insulin aspart  4 Units Subcutaneous TID WC   insulin glargine-yfgn  25 Units Subcutaneous BID   loratadine  10 mg Oral Daily   pantoprazole  40 mg Oral Daily   polyethylene glycol  17 g Oral Daily   rosuvastatin  40 mg Oral Daily   senna-docusate  2 tablet Oral QHS   sodium chloride flush  10-40 mL Intracatheter Q12H   sodium chloride flush  3 mL Intravenous Q12H   spironolactone  12.5 mg Oral Daily    Assessment: 77 yof presenting for HF optimization after outpatient cath  found 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 Now went into Aflutter 11/13 - no AC PTA.  Hgb stable 10-11 plt stable 300s.  heparin level 0.2 - drawn from central line -slightly <  goal  on heparin 1100 units/hr - running in PIV L hand. No issues with the infusion or bleeding noted per RN.  Goal of Therapy:  Heparin level 0.3-0.7 units/ml Monitor platelets by anticoagulation protocol: Yes   Plan:  Increase heparin drip at 1200 units/hr Check heparin level and cbc daily while on heparin Monitor s/s bleeding    Leota Sauers Pharm.D. CPP, BCPS Clinical Pharmacist [phone] 09/01/2023 2:48 PM    Please check AMION for all [facility] phone numbers After 10:00 PM, call Main Pharmacy [phone]

## 2023-09-01 NOTE — Progress Notes (Signed)
1 Day Post-Op Procedure(s) (LRB): TRANSESOPHAGEAL ECHOCARDIOGRAM (N/A) Subjective: Feels well, going for walk  Objective: Vital signs in last 24 hours: Temp:  [98.1 F (36.7 C)-99 F (37.2 C)] 99 F (37.2 C) (11/15 0645) Pulse Rate:  [75-92] 90 (11/15 0800) Cardiac Rhythm: Normal sinus rhythm (11/15 0400) Resp:  [15-29] 22 (11/15 0800) BP: (104-132)/(51-103) 118/103 (11/15 0700) SpO2:  [93 %-98 %] 95 % (11/15 0800) Weight:  [92.3 kg] 92.3 kg (11/15 0500)  Hemodynamic parameters for last 24 hours: CVP:  [0 mmHg-11 mmHg] 9 mmHg  Intake/Output from previous day: 11/14 0701 - 11/15 0700 In: 591.1 [I.V.:591.1] Out: 1150 [Urine:1150] Intake/Output this shift: Total I/O In: 27.7 [I.V.:27.7] Out: -   General appearance: alert, cooperative, and no distress Neurologic: intact Heart: regular rate and rhythm and systolic murmur  Lab Results: Recent Labs    08/30/23 0355 08/31/23 0319  WBC 7.2 11.0*  HGB 10.1* 11.5*  HCT 32.7* 35.9*  PLT 299 330   BMET:  Recent Labs    08/31/23 0319 09/01/23 0300  NA 129* 134*  K 4.5 4.2  CL 94* 97*  CO2 27 27  GLUCOSE 274* 246*  BUN 19 17  CREATININE 1.42* 0.95  CALCIUM 8.3* 8.7*    PT/INR: No results for input(s): "LABPROT", "INR" in the last 72 hours. ABG    Component Value Date/Time   HCO3 23.1 08/28/2023 1345   TCO2 24 08/28/2023 1345   ACIDBASEDEF 2.0 08/28/2023 1345   O2SAT 62.4 09/01/2023 0300   CBG (last 3)  Recent Labs    08/31/23 1643 08/31/23 2112 09/01/23 0644  GLUCAP 423* 313* 252*    Assessment/Plan: S/P Procedure(s) (LRB): TRANSESOPHAGEAL ECHOCARDIOGRAM (N/A) Severe 3 vessel CAD, ischemic cardiomyopathy, severe MR  Discussed TEE results with Dr. Gala Romney Severe MR Severe biventricular dysfunction Bi-atrial enlargement  CBG still markedly elevated- increase SemGlee to 25 BID- will likely need additional increases as she was on higher doses of insulin at home and was also on metformin and  Ozempic.  Creatiine up from 0.9 to 1.4- diuretics being held today  Agree with plan for repeat RHC on Monday    LOS: 4 days    [name] 09/01/2023

## 2023-09-01 NOTE — Progress Notes (Addendum)
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  11/13: AFL w/ RVR. Started on IV amio + heparin gtt. Milrinone discontinued    11/14: TEE: EF 25%, RV severely reduced, severe LAE, 3-4+ MR  CO-OX 62% off milrinone.  CVP 5  Maintaining SR.  Feeling well. Has ambulated around the unit a couple of times today.   Objective:   Weight Range: 92.3 kg Body mass index is 34.93 kg/m.   Vital Signs:   Temp:  [98.1 F (36.7 C)-99 F (37.2 C)] 99 F (37.2 C) (11/15 0645) Pulse Rate:  [75-93] 90 (11/15 0800) Resp:  [15-29] 22 (11/15 0800) BP: (104-134)/(51-103) 118/103 (11/15 0700) SpO2:  [93 %-98 %] 95 % (11/15 0800) Weight:  [92.3 kg] 92.3 kg (11/15 0500) Last BM Date : 08/30/23  Weight change: Filed Weights   08/30/23 0631 08/31/23 0500 09/01/23 0500  Weight: 93 kg 99.3 kg 92.3 kg    Intake/Output:   Intake/Output Summary (Last 24 hours) at 09/01/2023 1018 Last data filed at 09/01/2023 0800 Gross per 24 hour  Intake 536.31 ml  Output 1150 ml  Net -613.69 ml      Physical Exam    CVP 5 General:  Well appearing. Sitting up in chair. HEENT: normal Neck: supple. JVP not elevated. L internal jugular CVC Cor: PMI nondisplaced. Regular rate & rhythm. No rubs, gallops, 2/6 MR murmur Lungs: clear Abdomen: obese, soft, nontender, nondistended.  Extremities: no cyanosis, clubbing, rash, trace edema Neuro: alert & oriented. Affect pleasant    Telemetry   SR 80s-90s  EKG   N/A   Labs    CBC Recent Labs    08/30/23 0355 08/31/23 0319  WBC 7.2 11.0*  HGB 10.1* 11.5*  HCT 32.7* 35.9*  MCV 80.3 80.3  PLT 299 330   Basic Metabolic Panel Recent Labs    91/47/82 0319 09/01/23 0300  NA 129* 134*  K 4.5 4.2  CL 94* 97*  CO2 27 27  GLUCOSE 274* 246*  BUN 19 17  CREATININE 1.42* 0.95  CALCIUM 8.3*  8.7*  MG 1.9 2.1   Liver Function Tests No results for input(s): "AST", "ALT", "ALKPHOS", "BILITOT", "PROT", "ALBUMIN" in the last 72 hours.  No results for input(s): "LIPASE", "AMYLASE" in the last 72 hours. Cardiac Enzymes No results for input(s): "CKTOTAL", "CKMB", "CKMBINDEX", "TROPONINI" in the last 72 hours.  BNP: BNP (last 3 results) No results for input(s): "BNP" in the last 8760 hours.  ProBNP (last 3 results) No results for input(s): "PROBNP" in the last 8760 hours.   D-Dimer No results for input(s): "DDIMER" in the last 72 hours. Hemoglobin A1C No results for input(s): "HGBA1C" in the last 72 hours.  Fasting Lipid Panel No results for input(s): "CHOL", "HDL", "LDLCALC", "TRIG", "CHOLHDL", "LDLDIRECT" in the last 72 hours. Thyroid Function Tests No results for input(s): "TSH", "T4TOTAL", "T3FREE", "THYROIDAB" in the last 72 hours.  Invalid input(s): "FREET3"  Other results:   Imaging    EP STUDY  Result Date: 08/31/2023 See surgical note for result.    Medications:     Scheduled Medications:  aspirin EC  81 mg Oral Daily   Chlorhexidine Gluconate Cloth  6 each Topical Daily   Chlorhexidine Gluconate Cloth  6 each Topical Daily   ezetimibe  10 mg Oral Daily   gabapentin  100  mg Oral QHS   insulin aspart  0-15 Units Subcutaneous TID WC   insulin aspart  0-5 Units Subcutaneous QHS   insulin aspart  4 Units Subcutaneous TID WC   insulin glargine-yfgn  18 Units Subcutaneous BID   loratadine  10 mg Oral Daily   pantoprazole  40 mg Oral Daily   rosuvastatin  40 mg Oral Daily   sodium chloride flush  10-40 mL Intracatheter Q12H   sodium chloride flush  3 mL Intravenous Q12H   spironolactone  12.5 mg Oral Daily    Infusions:  amiodarone 30 mg/hr (09/01/23 0800)   heparin 1,100 Units/hr (09/01/23 0800)   norepinephrine (LEVOPHED) Adult infusion Stopped (08/30/23 1131)    PRN Medications: acetaminophen, nitroGLYCERIN, ondansetron (ZOFRAN) IV, mouth  rinse, sodium chloride flush, sodium chloride flush    Patient Profile   77 y/o female w/ h/o CAD s/p remote LAD and RCA PCI in 2011, Type 2DM and HLD, recently diagnosed w/ new systolic heart failure and severe MR by echo. 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 AHF team consulted for HF optimization prior to potential CABG + MVR.  Assessment/Plan   1. Acute Systolic Heart Failure w/ Low Output  - new. Prior Echo 2014 w/ normal EF 50-55%, normal RV - Echo 11/24 EF 25-30%, severe MR, RV low normal  - Ischemic CM. LHC w/ severe MVCAD. Planning CABG 11/20  - RHC c/w decompensated heart failure, severe LV dysfunction, and severe mitral regurgitation (RA 21, PAP 83/37/56, PCWP 36, CO 4.11, CI 2.04) - TEE: EF 25%, RV severely reduced, severe LAE, 3-4+ MR - Now off milrinone following bout of AFL with RVR - CO-OX stable 62% - CVP 5. Hold off on diuretic today. - continue spiro 12.5 mg daily  - no ? blocker w/ recent low output - hold off on ARNi and SGLT2i until post-op  - Plan for RHC on 11/18 w/ SWAN and possible IABP for pre-op optimization  2. 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 blocker w/ low output   4. SVT/ AT  - converted to NSR on amio gtt  - continue amio gtt at 30/hr  - continue heparin gtt - keep K > 4.0, Mg > 2.0  - ? MAZE at time of CABG/MVR    5. Type 2DM  - Hgb A1c 7.8  - SSI  6. AKI  - Resolved. Scr 0.95 today. - likely 2/2 diuresis + transient hypotension w/ SVT  - Monitor.   Length  of Stay: 4  [name], Diana N, PA-C  09/01/2023, 10:18 AM  Advanced Heart Failure Team Pager [phone] (M-F; 7a - 5p)  Please contact [facility] for night-coverage after hours (5p -7a ) and weekends on [email or web address]  Patient seen and examined with the above-signed Advanced Practice Provider and/or Housestaff. I personally reviewed laboratory data, imaging studies and relevant notes. I independently examined the patient and formulated the important aspects of the plan. I have edited the note to reflect  any of my changes or salient points. I have personally discussed the plan with the patient and/or family.  Remains in NSR. Has diuresed well. CVP 5 SCr up slightly.   TEE yesterday EF 25% RV severely down 3-4+ MR  Denies CP or SOB. Doing about 500cc on IS.   General:  Well appearing. No resp difficulty HEENT: normal Neck: supple. no JVD. Carotids 2+ bilat; no bruits. No lymphadenopathy or thryomegaly appreciated. Cor: PMI nondisplaced. Regular rate & rhythm. No rubs, gallops or murmurs. Lungs: clear Abdomen: obese soft, nontender, nondistended. No hepatosplenomegaly. No bruits or masses. Good bowel sounds. Extremities: no cyanosis, clubbing, rash, edema Neuro: alert & orientedx3, cranial nerves grossly intact. moves all 4 extremities w/o difficulty. Affect pleasant  Volume status much improved. Maintaining SR.   Given severe biventricular dysfunction, obesity and respiratory muscle weakness surgery will be very high risk   Plan RHC on Monday to help optimize for CABG/MVR on Wednesday (can consider resuming milrinone, IABP, etc)  Ambulate.   I discussed high-risk nature of surgery with Diana [name] and Diana [name] daughter.   Arvilla Meres, MD  1:54 PM

## 2023-09-01 NOTE — TOC Progression Note (Signed)
Transition of Care [facility]) - Progression Note    Patient Details  Name: Diana [name] MRN: [id number] Date of Birth: [birth year]  Transition of Care [facility]) CM/SW Contact  Elliot Cousin, RN Phone Number: [phone] 09/01/2023, 2:32 PM  Clinical Narrative:   Spoke to pt's dtr and discussed HH. Offered choice for HH (medicare.gov list provided and placed on chart) Dtr agreed on [facility], Contacted [facility] rep, Tresa Endo with new referral.  Contacted [employer] [facility] rep, Mitch with referral for rollator for home.  Will continue to follow for dc needs.  Will need HHRN and PT orders with [facility].      Expected Discharge Plan: Home w Home Health Services Barriers to Discharge: Continued Medical Work up  Expected Discharge Plan and Services   Discharge Planning Services: CM Consult Post Acute Care Choice: Home Health Living arrangements for the past 2 months: Single Family Home                   DME Agency: [employer] Date DME Agency Contacted: 09/01/23 Time DME Agency Contacted: 1430 Representative spoke with at DME Agency: Mitch HH Arranged: RN, PT HH Agency: [facility] Date [facility] Agency Contacted: 09/01/23 Time HH Agency Contacted: 1432 Representative spoke with at [facility] Agency: Hassel Neth   Social Determinants of Health (SDOH) Interventions SDOH Screenings   Food Insecurity: No Food Insecurity (08/28/2023)  Housing: Low Risk  (08/28/2023)  Transportation Needs: No Transportation Needs (08/28/2023)  Utilities: Not At Risk (08/28/2023)  Tobacco Use: Low Risk  (08/29/2023)    Readmission Risk Interventions     No data to display

## 2023-09-01 NOTE — Progress Notes (Signed)
CARDIAC REHAB PHASE I   PRE:  Rate/Rhythm: 90 SR with PVCs    BP: sitting 98/76    SpO2: 95 RA  MODE:  Ambulation: 370 ft   POST:  Rate/Rhythm: 124 ST with PVCs    BP: sitting 157/78     SpO2: 96 RA  Before ambulation discussed with pt and daughter IS (only 400 ml currently), sternal precautions, mobility post op, and d/c planning. Appropriate questions. She will have family at d/c. She has a minimum of 9 stairs to get in to house so will benefit from PT c/s post op.    Pt able to move hips forward in recliner and stand using legs. Ambulated with RW, slow and steady. No major c/o but getting tired toward end. Able to sit following sternal precautions as well. She plans to practice bed mobility later with staff, ambulate x3 a day, and practice IS. Gave Transport planner.  [id number]  Diana [name] BS, ACSM-CEP 09/01/2023 12:04 PM

## 2023-09-02 DIAGNOSIS — I5023 Acute on chronic systolic (congestive) heart failure: Secondary | ICD-10-CM | POA: Diagnosis not present

## 2023-09-02 LAB — COOXEMETRY PANEL
Carboxyhemoglobin: 1.8 % — ABNORMAL HIGH (ref 0.5–1.5)
Methemoglobin: <0.7 % (ref 0.0–1.5)
O2 Saturation: 67.5 %
Total hemoglobin: 11.7 g/dL — ABNORMAL LOW (ref 12.0–16.0)

## 2023-09-02 LAB — BASIC METABOLIC PANEL
Anion gap: 10 (ref 5–15)
BUN: 20 mg/dL (ref 8–23)
CO2: 26 mmol/L (ref 22–32)
Calcium: 9 mg/dL (ref 8.9–10.3)
Chloride: 96 mmol/L — ABNORMAL LOW (ref 98–111)
Creatinine, Ser: 1.01 mg/dL — ABNORMAL HIGH (ref 0.44–1.00)
GFR, Estimated: 58 mL/min — ABNORMAL LOW (ref >60)
Glucose, Bld: 292 mg/dL — ABNORMAL HIGH (ref 70–99)
Potassium: 4.5 mmol/L (ref 3.5–5.1)
Sodium: 132 mmol/L — ABNORMAL LOW (ref 135–145)

## 2023-09-02 LAB — GLUCOSE, CAPILLARY
Glucose-Capillary: 293 mg/dL — ABNORMAL HIGH (ref 70–99)
Glucose-Capillary: 333 mg/dL — ABNORMAL HIGH (ref 70–99)
Glucose-Capillary: 261 mg/dL — ABNORMAL HIGH (ref 70–99)
Glucose-Capillary: 343 mg/dL — ABNORMAL HIGH (ref 70–99)

## 2023-09-02 LAB — MAGNESIUM: Magnesium: 2 mg/dL (ref 1.7–2.4)

## 2023-09-02 LAB — HEPARIN LEVEL (UNFRACTIONATED): Heparin Unfractionated: 0.61 [IU]/mL (ref 0.30–0.70)

## 2023-09-02 MED ORDER — SACUBITRIL-VALSARTAN 24-26 MG PO TABS
1.0000 | ORAL_TABLET | Freq: Two times a day (BID) | ORAL | Status: AC
  Administered 2023-09-02 – 2023-09-03 (×4): 1 via ORAL
  Filled 2023-09-02 (×4): qty 1

## 2023-09-02 NOTE — Progress Notes (Signed)
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  11/13: AFL w/ RVR. Started on IV amio + heparin gtt. Milrinone discontinued    11/14: TEE: EF 25%, RV severely reduced, severe LAE, 3-4+ MR  CO-OX 68% off milrinone.  CVP 5  Maintaining SR.  Walking halls with CR Denis CP or SOB. Working on IS ~ 700cc currently    Objective:   Weight Range: 92.1 kg Body mass index is 34.84 kg/m.   Vital Signs:   Temp:  [97.7 F (36.5 C)-98.9 F (37.2 C)] 97.7 F (36.5 C) (11/16 0700) Pulse Rate:  [77-100] 95 (11/16 1000) Resp:  [16-31] 16 (11/16 1000) BP: (104-157)/(57-101) 128/90 (11/16 1000) SpO2:  [90 %-98 %] 96 % (11/16 1000) Weight:  [92.1 kg] 92.1 kg (11/16 0500) Last BM Date : 09/02/23  Weight change: Filed Weights   08/31/23 0500 09/01/23 0500 09/02/23 0500  Weight: 99.3 kg 92.3 kg 92.1 kg    Intake/Output:   Intake/Output Summary (Last 24 hours) at 09/02/2023 1141 Last data filed at 09/02/2023 1000 Gross per 24 hour  Intake 654.53 ml  Output 2100 ml  Net -1445.47 ml      Physical Exam    General:  Sitting in chair  No resp difficulty HEENT: normal Neck: supple. no JVD. Carotids 2+ bilat; no bruits. No lymphadenopathy or thryomegaly appreciated. Cor: Regular rate & rhythm. 2/6 MR Lungs: clear Abdomen: obese soft, nontender, nondistended. No hepatosplenomegaly. No bruits or masses. Good bowel sounds. Extremities: no cyanosis, clubbing, rash, edema Neuro: alert & orientedx3, cranial nerves grossly intact. moves all 4 extremities w/o difficulty. Affect pleasant   Telemetry   SR 90 Personally reviewed   Labs    CBC Recent Labs    08/31/23 0319  WBC 11.0*  HGB 11.5*  HCT 35.9*  MCV 80.3  PLT 330   Basic Metabolic Panel Recent Labs    41/32/44 0300 09/02/23 0438  NA 134* 132*  K 4.2 4.5  CL  97* 96*  CO2 27 26  GLUCOSE 246* 292*  BUN 17 20  CREATININE 0.95 1.01*  CALCIUM 8.7* 9.0  MG 2.1 2.0   Liver Function Tests No results for input(s): "AST", "ALT", "ALKPHOS", "BILITOT", "PROT", "ALBUMIN" in the last 72 hours.  No results for input(s): "LIPASE", "AMYLASE" in the last 72 hours. Cardiac Enzymes No results for input(s): "CKTOTAL", "CKMB", "CKMBINDEX", "TROPONINI" in the last 72 hours.  BNP: BNP (last 3 results) No results for input(s): "BNP" in the last 8760 hours.  ProBNP (last 3 results) No results for input(s): "PROBNP" in the last 8760 hours.   D-Dimer No results for input(s): "DDIMER" in the last 72 hours. Hemoglobin A1C No results for input(s): "HGBA1C" in the last 72 hours.  Fasting Lipid Panel No results for input(s): "CHOL", "HDL", "LDLCALC", "TRIG", "CHOLHDL", "LDLDIRECT" in the last 72 hours. Thyroid Function Tests No results for input(s): "TSH", "T4TOTAL", "T3FREE", "THYROIDAB" in the last 72 hours.  Invalid input(s): "FREET3"  Other results:   Imaging    No results found.   Medications:     Scheduled Medications:  aspirin EC  81 mg Oral Daily   Chlorhexidine Gluconate Cloth  6 each Topical Daily   Chlorhexidine Gluconate Cloth  6 each Topical Daily   ezetimibe  10 mg Oral Daily   gabapentin  100 mg Oral  QHS   insulin aspart  0-15 Units Subcutaneous TID WC   insulin aspart  0-5 Units Subcutaneous QHS   insulin aspart  4 Units Subcutaneous TID WC   insulin glargine-yfgn  25 Units Subcutaneous BID   loratadine  10 mg Oral Daily   pantoprazole  40 mg Oral Daily   polyethylene glycol  17 g Oral Daily   rosuvastatin  40 mg Oral Daily   senna-docusate  2 tablet Oral QHS   sodium chloride flush  10-40 mL Intracatheter Q12H   sodium chloride flush  3 mL Intravenous Q12H   spironolactone  12.5 mg Oral Daily    Infusions:  amiodarone 30 mg/hr (09/02/23 1000)   heparin 1,200 Units/hr (09/02/23 1000)    PRN  Medications: acetaminophen, nitroGLYCERIN, ondansetron (ZOFRAN) IV, mouth rinse, sodium chloride flush, sodium chloride flush    Patient Profile   77 y/o female w/ h/o CAD s/p remote LAD and RCA PCI in 2011, Type 2DM and HLD, recently diagnosed w/ new systolic heart failure and severe MR by echo. 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 AHF team consulted for HF optimization prior to potential CABG + MVR.  Assessment/Plan   1. Acute Systolic Heart Failure w/ Low Output  - new. Prior Echo 2014 w/ normal EF 50-55%, normal RV - Echo 11/24 EF 25-30%, severe MR, RV low normal  - Ischemic CM. LHC w/ severe MVCAD. Planning CABG 11/20  - RHC c/w decompensated heart failure, severe LV dysfunction, and severe mitral regurgitation (RA 21, PAP 83/37/56, PCWP 36, CO 4.11, CI 2.04) - TEE: EF 25%, RV severely reduced, severe LAE, 3-4+ MR - Now off milrinone following bout of AFL with RVR - CO-OX stable 68% - CVP 5. Continue to hold off on diuretic today. - continue spiro 12.5 mg daily  - BP elevated. Add Entresto 24/26 - no ? blocker w/ recent low output - hold off on SGLT2i until post-op  - Plan for RHC on 11/18 w/ SWAN and possible IABP for pre-op optimization  2. 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 blocker w/ low output   4. SVT/ AT  - converted to NSR on amio gtt  - continue amio gtt at 30/hr  - continue heparin gtt - keep K > 4.0, Mg > 2.0  - ? MAZE at time of CABG/MVR    5. Type 2DM  - Hgb A1c 7.8  - SSI  6. AKI  - Resolved. Scr 0.95 -> 1.01 today. -  likely 2/2 diuresis + transient hypotension w/ SVT  - Monitor.  She will be high risk for CABG/MVR with severe RV failure and deconditioning. Encourage ambulation and IS   Plan RHC on Monday to make sure hemodynamics are optimized pre-op. Can consider restarting milrinone or IABP as needed.     Length of Stay: 5  Arvilla Meres, MD  09/02/2023, 11:41 AM  Advanced Heart Failure Team Pager [phone] (M-F; 7a - 5p)  Please contact [facility] for night-coverage after hours (5p -7a )  and weekends on [email or web address]

## 2023-09-02 NOTE — Progress Notes (Signed)
PHARMACY - ANTICOAGULATION CONSULT NOTE  Pharmacy Consult for heparin Indication:  atrial flutter  Allergies  Allergen Reactions   Lisinopril Cough   Sulfa Antibiotics Hives    Patient Measurements: Height: 5\' 4"  (162.6 cm) Weight: 92.1 kg (203 lb) IBW/kg (Calculated) : 54.7 Heparin Dosing Weight: 77 kg   Vital Signs: Temp: 98.6 F (37 C) (11/16 1200) Temp Source: Oral (11/16 1200) BP: 114/53 (11/16 1500) Pulse Rate: 88 (11/16 1500)  Labs: Recent Labs    08/31/23 0319 09/01/23 0300 09/01/23 1051 09/02/23 0438  HGB 11.5*  --   --   --   HCT 35.9*  --   --   --   PLT 330  --   --   --   HEPARINUNFRC 0.67 0.27* 0.20* 0.61  CREATININE 1.42* 0.95  --  1.01*    Estimated Creatinine Clearance: 53 mL/min (A) (by C-G formula based on SCr of 1.01 mg/dL (H)).   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    Dyslipidemia    HTN (hypertension)     Medications:  Scheduled:   aspirin EC  81 mg Oral Daily   Chlorhexidine Gluconate Cloth  6 each Topical Daily   Chlorhexidine Gluconate Cloth  6 each Topical Daily   ezetimibe  10 mg Oral Daily   gabapentin  100 mg Oral QHS   insulin aspart  0-15 Units Subcutaneous TID WC   insulin aspart  0-5 Units Subcutaneous QHS   insulin aspart  4 Units Subcutaneous TID WC   insulin glargine-yfgn  25 Units Subcutaneous BID   loratadine  10 mg Oral Daily   pantoprazole  40 mg Oral Daily   polyethylene glycol  17 g Oral Daily   rosuvastatin  40 mg Oral Daily   sacubitril-valsartan  1 tablet Oral BID   senna-docusate  2 tablet Oral QHS   sodium chloride flush  10-40 mL Intracatheter Q12H   sodium chloride flush  3 mL Intravenous Q12H   spironolactone  12.5 mg Oral Daily    Assessment: Diana [name] presenting for HF optimization after outpatient cath found severe multivessel CAD/MR and low  output. Now went into Aflutter 11/13 - no AC PTA.  Hgb stable 10-11 plt stable 300s - last cbc 11/14 - ordered for am labs - no bleeding noted heparin level 0.6 - drawn from central line -at goal  on heparin 1200 units/hr - running in PIV L hand. No issues with the infusion.   Goal of Therapy:  Heparin level 0.3-0.7 units/ml Monitor platelets by anticoagulation protocol: Yes   Plan:  Continue  heparin drip at 1200 units/hr Check heparin level and cbc daily while on heparin Monitor s/s bleeding    Leota Sauers Pharm.D. CPP, BCPS Clinical Pharmacist [phone] 09/02/2023 3:25 PM    Please check AMION for all [facility] phone numbers After 10:00 PM, call Main Pharmacy [phone]

## 2023-09-02 NOTE — Progress Notes (Signed)
CARDIAC REHAB PHASE I   PRE:  Rate/Rhythm: NSR/91  BP:  Sitting:  127/72      SaO2: 955 RA  MODE:  Ambulation: 370 ft   POST:  Rate/Rhythm: ST with PVC's/ 110  BP:  Sitting:136/65      SaO2: 97%  Pt received in chair, agrees to ambulate. Voice she walked today around 6 am.  Pt using good from to stand from chair, without assist, not using arms. Ambulated using walker, steady gait. No c/o SOB, dizziness or pain. Pt back to chair with call bell at side. IS 700 mL, encouraged 10 x/hr.    Lorin Picket, MS, ACSM EP-C, [facility] 09/02/2023 10:41-11:12

## 2023-09-03 DIAGNOSIS — I5023 Acute on chronic systolic (congestive) heart failure: Secondary | ICD-10-CM | POA: Diagnosis not present

## 2023-09-03 LAB — CBC
HCT: 35.8 % — ABNORMAL LOW (ref 36.0–46.0)
Hemoglobin: 11.4 g/dL — ABNORMAL LOW (ref 12.0–15.0)
MCH: 25.6 pg — ABNORMAL LOW (ref 26.0–34.0)
MCHC: 31.8 g/dL (ref 30.0–36.0)
MCV: 80.3 fL (ref 80.0–100.0)
Platelets: 287 10*3/uL (ref 150–400)
RBC: 4.46 MIL/uL (ref 3.87–5.11)
RDW: 15.5 % (ref 11.5–15.5)
WBC: 7.7 10*3/uL (ref 4.0–10.5)
nRBC: 0 % (ref 0.0–0.2)

## 2023-09-03 LAB — COOXEMETRY PANEL
Carboxyhemoglobin: 1.6 % — ABNORMAL HIGH (ref 0.5–1.5)
Methemoglobin: <0.7 % (ref 0.0–1.5)
O2 Saturation: 73.2 %
Total hemoglobin: 11.8 g/dL — ABNORMAL LOW (ref 12.0–16.0)

## 2023-09-03 LAB — BASIC METABOLIC PANEL
Anion gap: 8 (ref 5–15)
BUN: 18 mg/dL (ref 8–23)
CO2: 25 mmol/L (ref 22–32)
Calcium: 9.1 mg/dL (ref 8.9–10.3)
Chloride: 99 mmol/L (ref 98–111)
Creatinine, Ser: 1.04 mg/dL — ABNORMAL HIGH (ref 0.44–1.00)
GFR, Estimated: 56 mL/min — ABNORMAL LOW (ref >60)
Glucose, Bld: 295 mg/dL — ABNORMAL HIGH (ref 70–99)
Potassium: 4.4 mmol/L (ref 3.5–5.1)
Sodium: 132 mmol/L — ABNORMAL LOW (ref 135–145)

## 2023-09-03 LAB — GLUCOSE, CAPILLARY
Glucose-Capillary: 289 mg/dL — ABNORMAL HIGH (ref 70–99)
Glucose-Capillary: 320 mg/dL — ABNORMAL HIGH (ref 70–99)
Glucose-Capillary: 274 mg/dL — ABNORMAL HIGH (ref 70–99)
Glucose-Capillary: 299 mg/dL — ABNORMAL HIGH (ref 70–99)

## 2023-09-03 LAB — MAGNESIUM: Magnesium: 1.9 mg/dL (ref 1.7–2.4)

## 2023-09-03 LAB — HEPARIN LEVEL (UNFRACTIONATED): Heparin Unfractionated: 0.51 [IU]/mL (ref 0.30–0.70)

## 2023-09-03 MED ORDER — INSULIN GLARGINE-YFGN 100 UNIT/ML ~~LOC~~ SOLN
30.0000 [IU] | Freq: Two times a day (BID) | SUBCUTANEOUS | Status: DC
  Administered 2023-09-03: 30 [IU] via SUBCUTANEOUS
  Filled 2023-09-03 (×3): qty 0.3

## 2023-09-03 MED ORDER — INSULIN ASPART 100 UNIT/ML IJ SOLN
6.0000 [IU] | Freq: Three times a day (TID) | INTRAMUSCULAR | Status: DC
  Administered 2023-09-03: 6 [IU] via SUBCUTANEOUS

## 2023-09-03 MED ORDER — MAGNESIUM SULFATE 2 GM/50ML IV SOLN
2.0000 g | Freq: Once | INTRAVENOUS | Status: AC
  Administered 2023-09-03: 2 g via INTRAVENOUS
  Filled 2023-09-03: qty 50

## 2023-09-03 NOTE — Progress Notes (Signed)
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  11/13: AFL w/ RVR. Started on IV amio + heparin gtt. Milrinone discontinued    11/14: TEE: EF 25%, RV severely reduced, severe LAE, 3-4+ MR  CO-OX 73% off milrinone.  CVP 3-5  Maintaining SR  Walking halls.   Working with IS but still only getting 500-700 ccc    Objective:   Weight Range: 87.2 kg Body mass index is 33 kg/m.   Vital Signs:   Temp:  [97.6 F (36.4 C)-98.6 F (37 C)] 98.5 F (36.9 C) (11/17 0800) Pulse Rate:  [75-98] 92 (11/17 0800) Resp:  [17-31] 25 (11/17 0800) BP: (97-144)/(46-107) 97/86 (11/17 0800) SpO2:  [91 %-99 %] 95 % (11/17 0800) Weight:  [87.2 kg] 87.2 kg (11/17 0500) Last BM Date : 09/02/23  Weight change: Filed Weights   09/01/23 0500 09/02/23 0500 09/03/23 0500  Weight: 92.3 kg 92.1 kg 87.2 kg    Intake/Output:   Intake/Output Summary (Last 24 hours) at 09/03/2023 1006 Last data filed at 09/03/2023 0400 Gross per 24 hour  Intake 635.18 ml  Output 2250 ml  Net -1614.82 ml      Physical Exam    General:  Sitting in chair  No resp difficulty HEENT: normal Neck: supple. no JVD. Carotids 2+ bilat; no bruits. No lymphadenopathy or thryomegaly appreciated. Cor: PMI nondisplaced. Regular rate & rhythm. No rubs, gallops or murmurs. Lungs: clear Abdomen: obese soft, nontender, nondistended. No hepatosplenomegaly. No bruits or masses. Good bowel sounds. Extremities: no cyanosis, clubbing, rash, edema Neuro: alert & orientedx3, cranial nerves grossly intact. moves all 4 extremities w/o difficulty. Affect pleasant  Telemetry   SR 80-90s Personally reviewed   Labs    CBC Recent Labs    09/03/23 0400  WBC 7.7  HGB 11.4*  HCT 35.8*  MCV 80.3  PLT 287   Basic Metabolic Panel Recent Labs    16/10/96 0438 09/03/23 0400   NA 132* 132*  K 4.5 4.4  CL 96* 99  CO2 26 25  GLUCOSE 292* 295*  BUN 20 18  CREATININE 1.01* 1.04*  CALCIUM 9.0 9.1  MG 2.0 1.9   Liver Function Tests No results for input(s): "AST", "ALT", "ALKPHOS", "BILITOT", "PROT", "ALBUMIN" in the last 72 hours.  No results for input(s): "LIPASE", "AMYLASE" in the last 72 hours. Cardiac Enzymes No results for input(s): "CKTOTAL", "CKMB", "CKMBINDEX", "TROPONINI" in the last 72 hours.  BNP: BNP (last 3 results) No results for input(s): "BNP" in the last 8760 hours.  ProBNP (last 3 results) No results for input(s): "PROBNP" in the last 8760 hours.   D-Dimer No results for input(s): "DDIMER" in the last 72 hours. Hemoglobin A1C No results for input(s): "HGBA1C" in the last 72 hours.  Fasting Lipid Panel No results for input(s): "CHOL", "HDL", "LDLCALC", "TRIG", "CHOLHDL", "LDLDIRECT" in the last 72 hours. Thyroid Function Tests No results for input(s): "TSH", "T4TOTAL", "T3FREE", "THYROIDAB" in the last 72 hours.  Invalid input(s): "FREET3"  Other results:   Imaging    No results found.   Medications:     Scheduled Medications:  aspirin EC  81 mg Oral Daily   Chlorhexidine Gluconate Cloth  6 each Topical Daily   Chlorhexidine Gluconate Cloth  6 each Topical Daily   ezetimibe  10 mg Oral Daily   gabapentin  100 mg Oral QHS   insulin aspart  0-15 Units Subcutaneous TID WC   insulin aspart  0-5 Units Subcutaneous QHS   insulin aspart  4 Units Subcutaneous TID WC   insulin glargine-yfgn  25 Units Subcutaneous BID   loratadine  10 mg Oral Daily   pantoprazole  40 mg Oral Daily   polyethylene glycol  17 g Oral Daily   rosuvastatin  40 mg Oral Daily   sacubitril-valsartan  1 tablet Oral BID   senna-docusate  2 tablet Oral QHS   sodium chloride flush  10-40 mL Intracatheter Q12H   sodium chloride flush  3 mL Intravenous Q12H   spironolactone  12.5 mg Oral Daily    Infusions:  amiodarone 30 mg/hr (09/03/23 0400)    heparin 1,200 Units/hr (09/03/23 0400)    PRN Medications: acetaminophen, nitroGLYCERIN, ondansetron (ZOFRAN) IV, mouth rinse, sodium chloride flush, sodium chloride flush    Patient Profile   77 y/o female w/ h/o CAD s/p remote LAD and RCA PCI in 2011, Type 2DM and HLD, recently diagnosed w/ new systolic heart failure and severe MR by echo. 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 AHF team consulted for HF optimization prior to potential CABG + MVR.  Assessment/Plan   1. Acute Systolic Heart Failure w/ Low Output  - new. Prior Echo 2014 w/ normal EF 50-55%, normal RV - Echo 11/24 EF 25-30%, severe MR, RV low normal  - Ischemic CM. LHC w/ severe MVCAD. Planning CABG 11/20  - RHC c/w decompensated heart failure, severe LV dysfunction, and severe mitral regurgitation (RA 21, PAP 83/37/56, PCWP 36, CO 4.11, CI 2.04) - TEE: EF 25%, RV severely reduced, severe LAE, 3-4+ MR - Now off milrinone following bout of AFL with RVR - CO-OX stable 73% - CVP 3-5. Continue to hold off on diuretic today. - continue spiro 12.5 mg daily  - Continue Entresto 24/26 - no ? blocker w/ recent low output - hold off on SGLT2i until post-op  - Plan for RHC tomorrow w/ SWAN and possible milrinone vs IABP for pre-op optimization  2. 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 blocker w/ low output   4. SVT/ AT  - converted to NSR on amio gtt  - continue amio gtt at 30/hr  - continue heparin gtt - keep K > 4.0, Mg > 2.0  - ? MAZE at time of CABG/MVR    5. Type 2DM   - Hgb A1c 7.8  - SSI  6. AKI  - Resolved. Scr 1.04 today. - likely 2/2 diuresis + transient hypotension w/ SVT    She will be high risk for CABG/MVR with severe RV failure and deconditioning and reduced ventilatory performance on IS> Encourage ambulation and IS   Plan RHC tomorrow to make sure hemodynamics are optimized pre-op. Can consider restarting milrinone or IABP as needed.     Length of Stay: 6  Arvilla Meres, MD  09/03/2023, 10:06 AM  Advanced Heart Failure Team  Pager [phone] (M-F; 7a - 5p)  Please contact [facility] for night-coverage after hours (5p -7a ) and weekends on [email or web address]

## 2023-09-03 NOTE — Plan of Care (Signed)
  Problem: Clinical Measurements: Goal: Ability to maintain clinical measurements within normal limits will improve Outcome: Progressing Goal: Will remain free from infection Outcome: Progressing Goal: Diagnostic test results will improve Outcome: Progressing Goal: Respiratory complications will improve Outcome: Progressing Goal: Cardiovascular complication will be avoided Outcome: Progressing   Problem: Cardiovascular: Goal: Ability to achieve and maintain adequate cardiovascular perfusion will improve Outcome: Progressing Goal: Vascular access site(s) Level 0-1 will be maintained Outcome: Progressing

## 2023-09-03 NOTE — Progress Notes (Signed)
PHARMACY - ANTICOAGULATION CONSULT NOTE  Pharmacy Consult for heparin Indication:  atrial flutter  Allergies  Allergen Reactions   Lisinopril Cough   Sulfa Antibiotics Hives    Patient Measurements: Height: 5\' 4"  (162.6 cm) Weight: 87.2 kg (192 lb 3.9 oz) IBW/kg (Calculated) : 54.7 Heparin Dosing Weight: 77 kg   Vital Signs: Temp: 98.7 F (37.1 C) (11/17 1200) Temp Source: Oral (11/17 1200) BP: 118/104 (11/17 0900) Pulse Rate: 86 (11/17 0900)  Labs: Recent Labs    09/01/23 0300 09/01/23 1051 09/02/23 0438 09/03/23 0400  HGB  --   --   --  11.4*  HCT  --   --   --  35.8*  PLT  --   --   --  287  HEPARINUNFRC 0.27* 0.20* 0.61 0.51  CREATININE 0.95  --  1.01* 1.04*    Estimated Creatinine Clearance: 50 mL/min (A) (by C-G formula based on SCr of 1.04 mg/dL (H)).   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    Dyslipidemia    HTN (hypertension)     Medications:  Scheduled:   aspirin EC  81 mg Oral Daily   Chlorhexidine Gluconate Cloth  6 each Topical Daily   Chlorhexidine Gluconate Cloth  6 each Topical Daily   ezetimibe  10 mg Oral Daily   gabapentin  100 mg Oral QHS   insulin aspart  0-15 Units Subcutaneous TID WC   insulin aspart  0-5 Units Subcutaneous QHS   insulin aspart  6 Units Subcutaneous TID WC   insulin glargine-yfgn  30 Units Subcutaneous BID   loratadine  10 mg Oral Daily   pantoprazole  40 mg Oral Daily   polyethylene glycol  17 g Oral Daily   rosuvastatin  40 mg Oral Daily   sacubitril-valsartan  1 tablet Oral BID   senna-docusate  2 tablet Oral QHS   sodium chloride flush  10-40 mL Intracatheter Q12H   sodium chloride flush  3 mL Intravenous Q12H   spironolactone  12.5 mg Oral Daily    Assessment: 77 yof presenting for HF optimization after outpatient cath found severe multivessel CAD/MR and  low output. Now went into Aflutter 11/13 - no AC PTA.  Hgb stable 10-11 plt stable 200-300s - no bleeding noted heparin level 0.5 - drawn from central line -at goal  on heparin 1200 units/hr - running in PIV L hand. No issues with the infusion.   Goal of Therapy:  Heparin level 0.3-0.7 units/ml Monitor platelets by anticoagulation protocol: Yes   Plan:  Continue  heparin drip at 1200 units/hr Check heparin level and cbc daily while on heparin Monitor s/s bleeding    Leota Sauers Pharm.D. CPP, BCPS Clinical Pharmacist [phone] 09/03/2023 1:01 PM    Please check AMION for all [facility] Pharmacy phone numbers After 10:00 PM, call Main Pharmacy [phone]

## 2023-09-04 ENCOUNTER — Inpatient Hospital Stay (HOSPITAL_COMMUNITY): Payer: Medicare HMO

## 2023-09-04 ENCOUNTER — Encounter (HOSPITAL_COMMUNITY)
Admission: AD | Disposition: A | Discharge status: Home Health | Payer: Self-pay | Source: Ambulatory Visit | Attending: Thoracic Surgery (Cardiothoracic Vascular Surgery)

## 2023-09-04 ENCOUNTER — Ambulatory Visit (HOSPITAL_COMMUNITY): Admission: RE | Admit: 2023-09-04 | Payer: Medicare HMO | Source: Ambulatory Visit | Admitting: Cardiology

## 2023-09-04 DIAGNOSIS — Z01810 Encounter for preprocedural cardiovascular examination: Secondary | ICD-10-CM | POA: Diagnosis not present

## 2023-09-04 DIAGNOSIS — I5023 Acute on chronic systolic (congestive) heart failure: Secondary | ICD-10-CM | POA: Diagnosis not present

## 2023-09-04 HISTORY — PX: RIGHT HEART CATH: CATH118263

## 2023-09-04 LAB — CBC
HCT: 36 % (ref 36.0–46.0)
Hemoglobin: 11.1 g/dL — ABNORMAL LOW (ref 12.0–15.0)
MCH: 24.8 pg — ABNORMAL LOW (ref 26.0–34.0)
MCHC: 30.8 g/dL (ref 30.0–36.0)
MCV: 80.5 fL (ref 80.0–100.0)
Platelets: 279 10*3/uL (ref 150–400)
RBC: 4.47 MIL/uL (ref 3.87–5.11)
RDW: 15.6 % — ABNORMAL HIGH (ref 11.5–15.5)
WBC: 8.4 10*3/uL (ref 4.0–10.5)
nRBC: 0 % (ref 0.0–0.2)

## 2023-09-04 LAB — POCT I-STAT EG7
Acid-base deficit: 1 mmol/L (ref 0.0–2.0)
Acid-base deficit: 1 mmol/L (ref 0.0–2.0)
Bicarbonate: 24.5 mmol/L (ref 20.0–28.0)
Bicarbonate: 24.7 mmol/L (ref 20.0–28.0)
Calcium, Ion: 1.19 mmol/L (ref 1.15–1.40)
Calcium, Ion: 1.23 mmol/L (ref 1.15–1.40)
HCT: 38 % (ref 36.0–46.0)
HCT: 38 % (ref 36.0–46.0)
Hemoglobin: 12.9 g/dL (ref 12.0–15.0)
Hemoglobin: 12.9 g/dL (ref 12.0–15.0)
O2 Saturation: 51 %
O2 Saturation: 56 %
Potassium: 4.7 mmol/L (ref 3.5–5.1)
Potassium: 4.7 mmol/L (ref 3.5–5.1)
Sodium: 134 mmol/L — ABNORMAL LOW (ref 135–145)
Sodium: 134 mmol/L — ABNORMAL LOW (ref 135–145)
TCO2: 26 mmol/L (ref 22–32)
TCO2: 26 mmol/L (ref 22–32)
pCO2, Ven: 41 mm[Hg] — ABNORMAL LOW (ref 44–60)
pCO2, Ven: 42 mm[Hg] — ABNORMAL LOW (ref 44–60)
pH, Ven: 7.377 (ref 7.25–7.43)
pH, Ven: 7.386 (ref 7.25–7.43)
pO2, Ven: 28 mm[Hg] — CL (ref 32–45)
pO2, Ven: 30 mm[Hg] — CL (ref 32–45)

## 2023-09-04 LAB — COOXEMETRY PANEL
Carboxyhemoglobin: 1.5 % (ref 0.5–1.5)
Carboxyhemoglobin: 1.1 % (ref 0.5–1.5)
Carboxyhemoglobin: 1.5 % (ref 0.5–1.5)
Methemoglobin: 1.1 % (ref 0.0–1.5)
Methemoglobin: <0.7 % (ref 0.0–1.5)
Methemoglobin: <0.7 % (ref 0.0–1.5)
O2 Saturation: 60 %
O2 Saturation: 58.1 %
O2 Saturation: 64.9 %
Total hemoglobin: 12 g/dL (ref 12.0–16.0)
Total hemoglobin: 12.1 g/dL (ref 12.0–16.0)
Total hemoglobin: 12.1 g/dL (ref 12.0–16.0)

## 2023-09-04 LAB — MAGNESIUM: Magnesium: 2.1 mg/dL (ref 1.7–2.4)

## 2023-09-04 LAB — BASIC METABOLIC PANEL
Anion gap: 7 (ref 5–15)
BUN: 20 mg/dL (ref 8–23)
CO2: 25 mmol/L (ref 22–32)
Calcium: 8.9 mg/dL (ref 8.9–10.3)
Chloride: 100 mmol/L (ref 98–111)
Creatinine, Ser: 1.1 mg/dL — ABNORMAL HIGH (ref 0.44–1.00)
GFR, Estimated: 52 mL/min — ABNORMAL LOW (ref >60)
Glucose, Bld: 284 mg/dL — ABNORMAL HIGH (ref 70–99)
Potassium: 4.3 mmol/L (ref 3.5–5.1)
Sodium: 132 mmol/L — ABNORMAL LOW (ref 135–145)

## 2023-09-04 LAB — GLUCOSE, CAPILLARY
Glucose-Capillary: 260 mg/dL — ABNORMAL HIGH (ref 70–99)
Glucose-Capillary: 269 mg/dL — ABNORMAL HIGH (ref 70–99)
Glucose-Capillary: 289 mg/dL — ABNORMAL HIGH (ref 70–99)
Glucose-Capillary: 276 mg/dL — ABNORMAL HIGH (ref 70–99)

## 2023-09-04 LAB — HEPARIN LEVEL (UNFRACTIONATED): Heparin Unfractionated: 0.66 [IU]/mL (ref 0.30–0.70)

## 2023-09-04 SURGERY — RIGHT HEART CATH
Anesthesia: LOCAL

## 2023-09-04 MED ORDER — MIDAZOLAM HCL 2 MG/2ML IJ SOLN
INTRAMUSCULAR | Status: DC | PRN
  Administered 2023-09-04: 1 mg via INTRAVENOUS

## 2023-09-04 MED ORDER — LIDOCAINE HCL (PF) 1 % IJ SOLN
INTRAMUSCULAR | Status: DC | PRN
  Administered 2023-09-04: 5 mL

## 2023-09-04 MED ORDER — FENTANYL CITRATE (PF) 100 MCG/2ML IJ SOLN
INTRAMUSCULAR | Status: AC
  Filled 2023-09-04: qty 2

## 2023-09-04 MED ORDER — INSULIN GLARGINE-YFGN 100 UNIT/ML ~~LOC~~ SOLN
50.0000 [IU] | Freq: Two times a day (BID) | SUBCUTANEOUS | Status: DC
  Administered 2023-09-04 (×2): 50 [IU] via SUBCUTANEOUS
  Filled 2023-09-04 (×6): qty 0.5

## 2023-09-04 MED ORDER — FENTANYL CITRATE (PF) 100 MCG/2ML IJ SOLN
INTRAMUSCULAR | Status: DC | PRN
  Administered 2023-09-04: 25 ug via INTRAVENOUS

## 2023-09-04 MED ORDER — INSULIN GLARGINE-YFGN 100 UNIT/ML ~~LOC~~ SOLN
40.0000 [IU] | Freq: Two times a day (BID) | SUBCUTANEOUS | Status: DC
  Filled 2023-09-04 (×2): qty 0.4

## 2023-09-04 MED ORDER — SODIUM CHLORIDE 0.9% FLUSH: 10.0000 mL | Freq: Two times a day (BID) | INTRAVENOUS | Status: DC

## 2023-09-04 MED ORDER — HEPARIN (PORCINE) IN NACL 1000-0.9 UT/500ML-% IV SOLN
INTRAVENOUS | Status: DC | PRN
  Administered 2023-09-04: 500 mL

## 2023-09-04 MED ORDER — MIDAZOLAM HCL 2 MG/2ML IJ SOLN
INTRAMUSCULAR | Status: AC
  Filled 2023-09-04: qty 2

## 2023-09-04 MED ORDER — INSULIN ASPART 100 UNIT/ML IJ SOLN
10.0000 [IU] | Freq: Three times a day (TID) | INTRAMUSCULAR | Status: DC
  Administered 2023-09-04 – 2023-09-05 (×3): 10 [IU] via SUBCUTANEOUS

## 2023-09-04 MED ORDER — LIDOCAINE HCL (PF) 1 % IJ SOLN
INTRAMUSCULAR | Status: AC
  Filled 2023-09-04: qty 30

## 2023-09-04 SURGICAL SUPPLY — 10 items
CATH SWAN GANZ 7F STRAIGHT (CATHETERS) IMPLANT
KIT MICROPUNCTURE NIT STIFF (SHEATH) IMPLANT
KIT SYRINGE INJ CVI SPIKEX1 (MISCELLANEOUS) IMPLANT
MAT PREVALON FULL STRYKER (MISCELLANEOUS) IMPLANT
PACK CARDIAC CATHETERIZATION (CUSTOM PROCEDURE TRAY) ×1 IMPLANT
SHEATH PINNACLE 7F 10CM (SHEATH) IMPLANT
SHEATH PROBE COVER 6X72 (BAG) IMPLANT
TRANSDUCER W/STOPCOCK (MISCELLANEOUS) IMPLANT
TUBING ART PRESS 72 MALE/FEM (TUBING) IMPLANT
WIRE MICROINTRODUCER 60CM (WIRE) IMPLANT

## 2023-09-04 NOTE — Progress Notes (Signed)
PHARMACY - ANTICOAGULATION CONSULT NOTE  Pharmacy Consult for heparin Indication:  atrial flutter  Allergies  Allergen Reactions   Lisinopril Cough   Sulfa Antibiotics Hives    Patient Measurements: Height: 5\' 4"  (162.6 cm) Weight: 92.3 kg (203 lb 7.8 oz) IBW/kg (Calculated) : 54.7 Heparin Dosing Weight: 77 kg   Vital Signs: Temp: 97.6 F (36.4 C) (11/18 1117) Temp Source: Axillary (11/18 1117) BP: 120/98 (11/18 1117) Pulse Rate: 92 (11/18 1117)  Labs: Recent Labs    09/02/23 0438 09/03/23 0400 09/04/23 0405  HGB  --  11.4* 11.1*  HCT  --  35.8* 36.0  PLT  --  287 279  HEPARINUNFRC 0.61 0.51 0.66  CREATININE 1.01* 1.04* 1.10*    Estimated Creatinine Clearance: 48.6 mL/min (A) (by C-G formula based on SCr of 1.1 mg/dL (H)).   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    Dyslipidemia    HTN (hypertension)     Medications:  Scheduled:   aspirin EC  81 mg Oral Daily   Chlorhexidine Gluconate Cloth  6 each Topical Daily   Chlorhexidine Gluconate Cloth  6 each Topical Daily   ezetimibe  10 mg Oral Daily   gabapentin  100 mg Oral QHS   insulin aspart  0-15 Units Subcutaneous TID WC   insulin aspart  0-5 Units Subcutaneous QHS   insulin aspart  10 Units Subcutaneous TID WC   insulin glargine-yfgn  50 Units Subcutaneous BID   loratadine  10 mg Oral Daily   pantoprazole  40 mg Oral Daily   polyethylene glycol  17 g Oral Daily   rosuvastatin  40 mg Oral Daily   senna-docusate  2 tablet Oral QHS   sodium chloride flush  10-40 mL Intracatheter Q12H   sodium chloride flush  3 mL Intravenous Q12H   spironolactone  12.5 mg Oral Daily    Assessment: 77 yof presenting for HF optimization after outpatient cath found 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 Now went into Aflutter 11/13 - no AC PTA.  Hgb stable 10-11 plt  stable 200-300s - no bleeding noted heparin level 0.66 - drawn from central line -at goal  on heparin 1200 units/hr - running in PIV L hand. No issues with the infusion.   Goal of Therapy:  Heparin level 0.3-0.7 units/ml Monitor platelets by anticoagulation protocol: Yes   Plan:  Continue  heparin drip at 1200 units/hr Check heparin level and cbc daily while on heparin Monitor s/s bleeding   Reece Leader, Colon Flattery, [facility] Clinical Pharmacist  09/04/2023 3:22 PM   [facility] pharmacy phone numbers are listed on [email or web address]

## 2023-09-04 NOTE — Progress Notes (Signed)
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  11/13: AFL w/ RVR. Started on IV amio + heparin gtt. Milrinone discontinued    11/14: TEE: EF 25%, RV severely reduced, severe LAE, 3-4+ MR  Co-ox 65% RHC w PAC and possible milrinone vs IABP today. CVP 2.  Feels well this morning. No SOB or CP. Utilizing IS.   Objective:   Weight Range: 92.3 kg Body mass index is 34.93 kg/m.   Vital Signs:   Temp:  [98 F (36.7 C)-98.7 F (37.1 C)] 98.2 F (36.8 C) (11/18 0700) Pulse Rate:  [77-104] 87 (11/18 0600) Resp:  [13-31] 20 (11/18 0600) BP: (95-141)/(45-99) 119/64 (11/18 0600) SpO2:  [87 %-98 %] 96 % (11/18 0837) Weight:  [92.3 kg] 92.3 kg (11/18 0500) Last BM Date : 09/03/23  Weight change: Filed Weights   09/02/23 0500 09/03/23 0500 09/04/23 0500  Weight: 92.1 kg 87.2 kg 92.3 kg    Intake/Output:   Intake/Output Summary (Last 24 hours) at 09/04/2023 0909 Last data filed at 09/04/2023 0800 Gross per 24 hour  Intake 993.73 ml  Output 2200 ml  Net -1206.27 ml   Physical Exam    CVP 2 General: Sitting up in bed. No distress on RA HEENT: neck supple.   Cardiac: JVP not visible. S1 and S2 present. No murmurs or rub. Resp: Lung sounds clear and equal B/L. Diminished lowers Abdomen: Soft, non-tender, non-distended. + BS. Extremities: Warm and dry. No rash, cyanosis.  Mild peripheral edema.  Neuro: Alert and oriented x3. Affect pleasant. Moves all extremities without difficulty. Lines/Devices:  LIJ CVL   Telemetry   SR 90s (personally reviewed)  Labs    CBC Recent Labs    09/03/23 0400 09/04/23 0405  WBC 7.7 8.4  HGB 11.4* 11.1*  HCT 35.8* 36.0  MCV 80.3 80.5  PLT 287 279   Basic Metabolic Panel Recent Labs    74/25/95 0400 09/04/23 0405  NA 132* 132*  K 4.4 4.3  CL 99 100  CO2 25 25  GLUCOSE  295* 284*  BUN 18 20  CREATININE 1.04* 1.10*  CALCIUM 9.1 8.9  MG 1.9 2.1   Liver Function Tests No results for input(s): "AST", "ALT", "ALKPHOS", "BILITOT", "PROT", "ALBUMIN" in the last 72 hours.  No results for input(s): "LIPASE", "AMYLASE" in the last 72 hours. Cardiac Enzymes No results for input(s): "CKTOTAL", "CKMB", "CKMBINDEX", "TROPONINI" in the last 72 hours.  BNP: BNP (last 3 results) No results for input(s): "BNP" in the last 8760 hours.  ProBNP (last 3 results) No results for input(s): "PROBNP" in the last 8760 hours.   D-Dimer No results for input(s): "DDIMER" in the last 72 hours. Hemoglobin A1C No results for input(s): "HGBA1C" in the last 72 hours.  Fasting Lipid Panel No results for input(s): "CHOL", "HDL", "LDLCALC", "TRIG", "CHOLHDL", "LDLDIRECT" in the last 72 hours. Thyroid Function Tests No results for input(s): "TSH", "T4TOTAL", "T3FREE", "THYROIDAB" in the last 72 hours.  Invalid input(s): "FREET3"  Imaging   No results found.  Medications:    Scheduled Medications:  [MAR Hold] aspirin EC  81 mg Oral Daily   [MAR Hold] Chlorhexidine Gluconate Cloth  6 each Topical Daily   [MAR Hold] Chlorhexidine Gluconate Cloth  6 each Topical Daily   [MAR Hold] ezetimibe  10 mg Oral Daily   [MAR Hold]  gabapentin  100 mg Oral QHS   [MAR Hold] insulin aspart  0-15 Units Subcutaneous TID WC   [MAR Hold] insulin aspart  0-5 Units Subcutaneous QHS   [MAR Hold] insulin aspart  10 Units Subcutaneous TID WC   [MAR Hold] insulin glargine-yfgn  50 Units Subcutaneous BID   [MAR Hold] loratadine  10 mg Oral Daily   [MAR Hold] pantoprazole  40 mg Oral Daily   [MAR Hold] polyethylene glycol  17 g Oral Daily   [MAR Hold] rosuvastatin  40 mg Oral Daily   [MAR Hold] senna-docusate  2 tablet Oral QHS   sodium chloride flush  10 mL Intravenous Q12H   [MAR Hold] sodium chloride flush  10-40 mL Intracatheter Q12H   [MAR Hold] sodium chloride flush  3 mL Intravenous Q12H    [MAR Hold] spironolactone  12.5 mg Oral Daily    Infusions:  amiodarone 30 mg/hr (09/04/23 0800)   heparin 1,200 Units/hr (09/04/23 0800)    PRN Medications: [MAR Hold] acetaminophen, [MAR Hold] nitroGLYCERIN, [MAR Hold] ondansetron (ZOFRAN) IV, [MAR Hold] mouth rinse, [MAR Hold] sodium chloride flush, [MAR Hold] sodium chloride flush  Patient Profile   77 y/o female w/ h/o CAD s/p remote LAD and RCA PCI in 2011, Type 2DM and HLD, recently diagnosed w/ new systolic heart failure and severe MR by echo. 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 AHF team consulted for HF optimization prior to potential CABG + MVR.  Assessment/Plan   1. Acute Systolic Heart Failure w/ Low Output  - new. Prior Echo 2014 w/ normal EF 50-55%, normal RV - Echo 11/24 EF 25-30%, severe MR, RV low normal  - Ischemic CM. LHC w/ severe MVCAD. Planning CABG 11/20  - RHC c/w decompensated heart failure, severe LV dysfunction, and severe mitral regurgitation (RA 21, PAP 83/37/56, PCWP 36, CO 4.11, CI 2.04) - TEE: EF 25%, RV severely reduced, severe LAE, 3-4+ MR - Now off milrinone following bout of AFL with RVR - Co-ox stable 65% - CVP 2-3. Hold diuresis. - Continue Spiro 12.5 mg daily  - Continue Entresto 24/26 - no ? blocker w/ recent low output - hold off on SGLT2i until post-op  - RHC w/ SWAN and possible milrinone vs IABP for pre-op optimization today  2. Severe MR - appears ischemic w/ posterior leaflet tethering on echo  - cath with giant V waves of greater than 60 mmHg + elevated BiV filling pressures - TEE EF 25% 3-4+ MR  - scheduled for CABG w/ MV repair vs replacement 11/20 - repeat RHC today   3. Severe MVCAD - CTO prox-dist RCA, 75% mid-dist LM, 95% ost-prox LCx, 25% ost-prox LAD - CABG scheduled 11/20 - No s/s angina - ASA + statin  - no ? blocker w/ low output   4. SVT/ AT  - converted to NSR on  amio gtt  - continue amio gtt at 30/hr  - continue heparin gtt - keep K > 4.0, Mg > 2.0  - MV repair at time of CABG/MVR    5. Type 2DM  - Hgb A1c 7.8  - SSI  6. AKI  - Resolved. Scr 1.1 today. - likely 2/2 diuresis + transient hypotension w/ SVT   Length of Stay: 7  [place] Callie Bunyard, NP  09/04/2023, 9:09 AM  Advanced Heart Failure Team Pager [phone] (M-F; 7a - 5p)  Please contact [facility] for night-coverage after hours (5p -7a ) and weekends  on [email or web address]

## 2023-09-04 NOTE — Progress Notes (Signed)
PHARMACY - ANTICOAGULATION CONSULT NOTE  Pharmacy Consult for heparin Indication:  atrial flutter  Allergies  Allergen Reactions   Lisinopril Cough   Sulfa Antibiotics Hives    Patient Measurements: Height: 5\' 4"  (162.6 cm) Weight: 92.3 kg (203 lb 7.8 oz) IBW/kg (Calculated) : 54.7 Heparin Dosing Weight: 77 kg   Vital Signs: Temp: 97.6 F (36.4 C) (11/18 1117) Temp Source: Axillary (11/18 1117) BP: 120/98 (11/18 1117) Pulse Rate: 92 (11/18 1117)  Labs: Recent Labs    09/02/23 0438 09/03/23 0400 09/04/23 0405  HGB  --  11.4* 11.1*  HCT  --  35.8* 36.0  PLT  --  287 279  HEPARINUNFRC 0.61 0.51 0.66  CREATININE 1.01* 1.04* 1.10*    Estimated Creatinine Clearance: 48.6 mL/min (A) (by C-G formula based on SCr of 1.1 mg/dL (H)).   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    Dyslipidemia    HTN (hypertension)     Medications:  Scheduled:   aspirin EC  81 mg Oral Daily   Chlorhexidine Gluconate Cloth  6 each Topical Daily   Chlorhexidine Gluconate Cloth  6 each Topical Daily   ezetimibe  10 mg Oral Daily   gabapentin  100 mg Oral QHS   insulin aspart  0-15 Units Subcutaneous TID WC   insulin aspart  0-5 Units Subcutaneous QHS   insulin aspart  10 Units Subcutaneous TID WC   insulin glargine-yfgn  50 Units Subcutaneous BID   loratadine  10 mg Oral Daily   pantoprazole  40 mg Oral Daily   polyethylene glycol  17 g Oral Daily   rosuvastatin  40 mg Oral Daily   senna-docusate  2 tablet Oral QHS   sodium chloride flush  10-40 mL Intracatheter Q12H   sodium chloride flush  3 mL Intravenous Q12H   spironolactone  12.5 mg Oral Daily    Assessment: 77 yof presenting for HF optimization after outpatient cath found 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 Now went into Aflutter 11/13 - no AC PTA.  Hgb stable 10-11 plt  stable 200-300s - no bleeding noted heparin level 0.5 - drawn from central line -at goal  on heparin 1200 units/hr - running in PIV L hand. No issues with the infusion.   Goal of Therapy:  Heparin level 0.3-0.7 units/ml Monitor platelets by anticoagulation protocol: Yes   Plan:  Continue heparin drip at 1200 units/hr Check heparin level and cbc daily while on heparin Monitor s/s bleeding   Reece Leader, Colon Flattery, [facility] Clinical Pharmacist  09/04/2023 3:25 PM   [facility] pharmacy phone numbers are listed on [email or web address]

## 2023-09-05 ENCOUNTER — Encounter (HOSPITAL_COMMUNITY): Payer: Self-pay | Admitting: Cardiology

## 2023-09-05 ENCOUNTER — Other Ambulatory Visit (HOSPITAL_COMMUNITY): Payer: Self-pay

## 2023-09-05 ENCOUNTER — Telehealth (HOSPITAL_COMMUNITY): Payer: Self-pay | Admitting: Pharmacy Technician

## 2023-09-05 DIAGNOSIS — I5023 Acute on chronic systolic (congestive) heart failure: Secondary | ICD-10-CM | POA: Diagnosis not present

## 2023-09-05 DIAGNOSIS — I340 Nonrheumatic mitral (valve) insufficiency: Secondary | ICD-10-CM | POA: Diagnosis not present

## 2023-09-05 LAB — GLUCOSE, CAPILLARY
Glucose-Capillary: 119 mg/dL — ABNORMAL HIGH (ref 70–99)
Glucose-Capillary: 130 mg/dL — ABNORMAL HIGH (ref 70–99)
Glucose-Capillary: 133 mg/dL — ABNORMAL HIGH (ref 70–99)
Glucose-Capillary: 135 mg/dL — ABNORMAL HIGH (ref 70–99)
Glucose-Capillary: 153 mg/dL — ABNORMAL HIGH (ref 70–99)
Glucose-Capillary: 158 mg/dL — ABNORMAL HIGH (ref 70–99)
Glucose-Capillary: 159 mg/dL — ABNORMAL HIGH (ref 70–99)
Glucose-Capillary: 170 mg/dL — ABNORMAL HIGH (ref 70–99)
Glucose-Capillary: 196 mg/dL — ABNORMAL HIGH (ref 70–99)
Glucose-Capillary: 198 mg/dL — ABNORMAL HIGH (ref 70–99)
Glucose-Capillary: 202 mg/dL — ABNORMAL HIGH (ref 70–99)
Glucose-Capillary: 204 mg/dL — ABNORMAL HIGH (ref 70–99)
Glucose-Capillary: 275 mg/dL — ABNORMAL HIGH (ref 70–99)
Glucose-Capillary: 283 mg/dL — ABNORMAL HIGH (ref 70–99)
Glucose-Capillary: 339 mg/dL — ABNORMAL HIGH (ref 70–99)

## 2023-09-05 LAB — COOXEMETRY PANEL
Carboxyhemoglobin: 1.6 % — ABNORMAL HIGH (ref 0.5–1.5)
Methemoglobin: <0.7 % (ref 0.0–1.5)
O2 Saturation: 64.2 %
Total hemoglobin: 11.7 g/dL — ABNORMAL LOW (ref 12.0–16.0)

## 2023-09-05 LAB — URINALYSIS, ROUTINE W REFLEX MICROSCOPIC
Bilirubin Urine: NEGATIVE
Glucose, UA: >=500 mg/dL — AB
Ketones, ur: NEGATIVE mg/dL
Nitrite: NEGATIVE
Protein, ur: NEGATIVE mg/dL
Specific Gravity, Urine: 1.005 (ref 1.005–1.030)
pH: 5 (ref 5.0–8.0)

## 2023-09-05 LAB — MAGNESIUM: Magnesium: 1.9 mg/dL (ref 1.7–2.4)

## 2023-09-05 LAB — SARS CORONAVIRUS 2 BY RT PCR: SARS Coronavirus 2 by RT PCR: NEGATIVE

## 2023-09-05 LAB — BASIC METABOLIC PANEL
Anion gap: 8 (ref 5–15)
BUN: 18 mg/dL (ref 8–23)
CO2: 23 mmol/L (ref 22–32)
Calcium: 8.9 mg/dL (ref 8.9–10.3)
Chloride: 100 mmol/L (ref 98–111)
Creatinine, Ser: 1.02 mg/dL — ABNORMAL HIGH (ref 0.44–1.00)
GFR, Estimated: 57 mL/min — ABNORMAL LOW (ref >60)
Glucose, Bld: 263 mg/dL — ABNORMAL HIGH (ref 70–99)
Potassium: 4.4 mmol/L (ref 3.5–5.1)
Sodium: 131 mmol/L — ABNORMAL LOW (ref 135–145)

## 2023-09-05 LAB — CBC
HCT: 35.2 % — ABNORMAL LOW (ref 36.0–46.0)
Hemoglobin: 11.2 g/dL — ABNORMAL LOW (ref 12.0–15.0)
MCH: 25.7 pg — ABNORMAL LOW (ref 26.0–34.0)
MCHC: 31.8 g/dL (ref 30.0–36.0)
MCV: 80.7 fL (ref 80.0–100.0)
Platelets: 278 10*3/uL (ref 150–400)
RBC: 4.36 MIL/uL (ref 3.87–5.11)
RDW: 15.9 % — ABNORMAL HIGH (ref 11.5–15.5)
WBC: 8.2 10*3/uL (ref 4.0–10.5)
nRBC: 0 % (ref 0.0–0.2)

## 2023-09-05 LAB — ABO/RH: ABO/RH(D): A POS

## 2023-09-05 LAB — HEPARIN LEVEL (UNFRACTIONATED): Heparin Unfractionated: 0.65 [IU]/mL (ref 0.30–0.70)

## 2023-09-05 MED ORDER — SODIUM CHLORIDE 0.9 % IV SOLN
INTRAVENOUS | Status: DC
  Administered 2023-09-05: 15 [IU]/h via INTRAVENOUS
  Administered 2023-09-05: 10 [IU]/h via INTRAVENOUS
  Administered 2023-09-06: 3.4 [IU]/h via INTRAVENOUS
  Filled 2023-09-05 (×3): qty 1

## 2023-09-05 MED ORDER — NOREPINEPHRINE 4 MG/250ML-% IV SOLN
0.0000 ug/min | INTRAVENOUS | Status: AC
  Administered 2023-09-06: 2 ug/min via INTRAVENOUS
  Filled 2023-09-05: qty 250

## 2023-09-05 MED ORDER — MAGNESIUM SULFATE 2 GM/50ML IV SOLN
2.0000 g | Freq: Once | INTRAVENOUS | Status: AC
  Administered 2023-09-05: 2 g via INTRAVENOUS
  Filled 2023-09-05: qty 50

## 2023-09-05 MED ORDER — POTASSIUM CHLORIDE 2 MEQ/ML IV SOLN
80.0000 meq | INTRAVENOUS | Status: DC
  Filled 2023-09-05: qty 40

## 2023-09-05 MED ORDER — SUFENTANIL CITRATE 50 MCG/ML IV SOLN
0.2500 ug/kg/h | Freq: Once | INTRAVENOUS | Status: AC
  Administered 2023-09-06: .4 ug/kg/h via INTRAVENOUS
  Administered 2023-09-06: .15 ug/kg/h via INTRAVENOUS
  Filled 2023-09-05 (×3): qty 2

## 2023-09-05 MED ORDER — MILRINONE LACTATE IN DEXTROSE 20-5 MG/100ML-% IV SOLN
0.3000 ug/kg/min | INTRAVENOUS | Status: AC
  Administered 2023-09-06: .25 ug/kg/min via INTRAVENOUS
  Filled 2023-09-05: qty 100

## 2023-09-05 MED ORDER — MAGNESIUM SULFATE 50 % IJ SOLN
40.0000 meq | INTRAMUSCULAR | Status: DC
  Filled 2023-09-05: qty 9.85

## 2023-09-05 MED ORDER — TRANEXAMIC ACID 1000 MG/10ML IV SOLN
1.5000 mg/kg/h | INTRAVENOUS | Status: AC
  Administered 2023-09-06: 1.5 mg/kg/h via INTRAVENOUS
  Filled 2023-09-05 (×2): qty 25

## 2023-09-05 MED ORDER — HEPARIN 30,000 UNITS/1000 ML (OHS) CELLSAVER SOLUTION
Status: DC
  Filled 2023-09-05: qty 1000

## 2023-09-05 MED ORDER — MANNITOL 20 % IV SOLN
INTRAVENOUS | Status: DC
  Filled 2023-09-05: qty 13

## 2023-09-05 MED ORDER — DEXMEDETOMIDINE HCL IN NACL 400 MCG/100ML IV SOLN
0.1000 ug/kg/h | INTRAVENOUS | Status: AC
  Administered 2023-09-06: .4 ug/kg/h via INTRAVENOUS
  Filled 2023-09-05: qty 100

## 2023-09-05 MED ORDER — NITROGLYCERIN IN D5W 200-5 MCG/ML-% IV SOLN
2.0000 ug/min | INTRAVENOUS | Status: DC
  Filled 2023-09-05: qty 250

## 2023-09-05 MED ORDER — INSULIN REGULAR(HUMAN) IN NACL 100-0.9 UT/100ML-% IV SOLN
INTRAVENOUS | Status: AC
  Filled 2023-09-05: qty 100

## 2023-09-05 MED ORDER — CHLORHEXIDINE GLUCONATE 0.12 % MT SOLN
15.0000 mL | Freq: Once | OROMUCOSAL | Status: AC
Start: 2023-09-06 — End: 2023-09-06
  Administered 2023-09-06: 15 mL via OROMUCOSAL
  Filled 2023-09-05: qty 15

## 2023-09-05 MED ORDER — METOPROLOL TARTRATE 12.5 MG HALF TABLET
12.5000 mg | ORAL_TABLET | Freq: Once | ORAL | Status: AC
  Administered 2023-09-06: 12.5 mg via ORAL
  Filled 2023-09-05: qty 1

## 2023-09-05 MED ORDER — VASOPRESSIN 20 UNITS/100 ML INFUSION FOR SHOCK
0.0000 [IU]/min | INTRAVENOUS | Status: DC
Start: 2023-09-05 — End: 2023-09-06
  Administered 2023-09-06: .03 [IU]/min via INTRAVENOUS

## 2023-09-05 MED ORDER — CHLORHEXIDINE GLUCONATE CLOTH 2 % EX PADS
6.0000 | MEDICATED_PAD | Freq: Once | CUTANEOUS | Status: AC
  Administered 2023-09-05: 6 via TOPICAL

## 2023-09-05 MED ORDER — BISACODYL 5 MG PO TBEC: 5.0000 mg | DELAYED_RELEASE_TABLET | Freq: Once | ORAL | Status: DC

## 2023-09-05 MED ORDER — DIAZEPAM 2 MG PO TABS
2.0000 mg | ORAL_TABLET | Freq: Once | ORAL | Status: AC
  Administered 2023-09-06: 2 mg via ORAL
  Filled 2023-09-05: qty 1

## 2023-09-05 MED ORDER — EPINEPHRINE HCL 5 MG/250ML IV SOLN IN NS
0.0000 ug/min | INTRAVENOUS | Status: AC
  Administered 2023-09-06: 3 ug/min via INTRAVENOUS
  Filled 2023-09-05: qty 250

## 2023-09-05 MED ORDER — CEFAZOLIN SODIUM-DEXTROSE 2-4 GM/100ML-% IV SOLN
2.0000 g | INTRAVENOUS | Status: DC
  Filled 2023-09-05: qty 100

## 2023-09-05 MED ORDER — CEFAZOLIN SODIUM-DEXTROSE 2-4 GM/100ML-% IV SOLN
2.0000 g | INTRAVENOUS | Status: AC
  Administered 2023-09-06 (×2): 2 g via INTRAVENOUS
  Filled 2023-09-05: qty 100

## 2023-09-05 MED ORDER — TRANEXAMIC ACID (OHS) PUMP PRIME SOLUTION
2.0000 mg/kg | INTRAVENOUS | Status: DC
  Filled 2023-09-05: qty 1.85

## 2023-09-05 MED ORDER — PHENYLEPHRINE HCL-NACL 20-0.9 MG/250ML-% IV SOLN
30.0000 ug/min | INTRAVENOUS | Status: DC
  Filled 2023-09-05: qty 250

## 2023-09-05 MED ORDER — TRANEXAMIC ACID (OHS) BOLUS VIA INFUSION
15.0000 mg/kg | INTRAVENOUS | Status: DC
  Filled 2023-09-05: qty 1386

## 2023-09-05 MED ORDER — VANCOMYCIN HCL 1500 MG/300ML IV SOLN
1500.0000 mg | INTRAVENOUS | Status: AC
  Administered 2023-09-06: 1500 mg via INTRAVENOUS
  Filled 2023-09-05: qty 300

## 2023-09-05 MED ORDER — PLASMA-LYTE A IV SOLN
INTRAVENOUS | Status: DC
  Filled 2023-09-05 (×2): qty 5

## 2023-09-05 MED ORDER — PLASMA-LYTE A IV SOLN
INTRAVENOUS | Status: DC
  Filled 2023-09-05: qty 2.5

## 2023-09-05 NOTE — Progress Notes (Signed)
PHARMACY - ANTICOAGULATION CONSULT NOTE  Pharmacy Consult for heparin Indication:  atrial flutter  Allergies  Allergen Reactions   Lisinopril Cough   Sulfa Antibiotics Hives    Patient Measurements: Height: 5\' 4"  (162.6 cm) Weight: 92.4 kg (203 lb 11.3 oz) IBW/kg (Calculated) : 54.7 Heparin Dosing Weight: 77 kg   Vital Signs: Temp: 97.7 F (36.5 C) (11/19 0645) Temp Source: Oral (11/19 0645) BP: 141/68 (11/19 0630) Pulse Rate: 96 (11/19 0700)  Labs: Recent Labs    09/03/23 0400 09/04/23 0405 09/04/23 0930 09/05/23 0446  HGB 11.4* 11.1* 12.9  12.9 11.2*  HCT 35.8* 36.0 38.0  38.0 35.2*  PLT 287 279  --  278  HEPARINUNFRC 0.51 0.66  --  0.65  CREATININE 1.04* 1.10*  --  1.02*    Estimated Creatinine Clearance: 52.5 mL/min (A) (by C-G formula based on SCr of 1.02 mg/dL (H)).   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    Dyslipidemia    HTN (hypertension)     Medications:  Scheduled:   aspirin EC  81 mg Oral Daily   Chlorhexidine Gluconate Cloth  6 each Topical Daily   Chlorhexidine Gluconate Cloth  6 each Topical Daily   ezetimibe  10 mg Oral Daily   gabapentin  100 mg Oral QHS   insulin aspart  0-15 Units Subcutaneous TID WC   insulin aspart  0-5 Units Subcutaneous QHS   insulin aspart  10 Units Subcutaneous TID WC   insulin glargine-yfgn  50 Units Subcutaneous BID   loratadine  10 mg Oral Daily   pantoprazole  40 mg Oral Daily   polyethylene glycol  17 g Oral Daily   rosuvastatin  40 mg Oral Daily   senna-docusate  2 tablet Oral QHS   sodium chloride flush  10-40 mL Intracatheter Q12H   sodium chloride flush  3 mL Intravenous Q12H   spironolactone  12.5 mg Oral Daily    Assessment: 77 yof presenting for HF optimization after outpatient cath found 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 Now went into  Aflutter 11/13 - no AC PTA.  Heparin level 0.65 is therapeutic with heparin running at 1200 units/hr. Hgb (11.2) and PLTs (278) are stable. Per RN, no report of pauses, issues with the line. Patient has a bit of swelling and soreness around internal jugular site from RHC yesterday. Given concern for oozing, will decrease her heparin goal to 0.3-0.5 per NP.   Goal of Therapy:  Heparin level 0.3-0.5 units/ml Monitor platelets by anticoagulation protocol: Yes   Plan:  Decrease heparin drip to 1100 units/hr Check heparin level and cbc daily while on heparin Monitor s/s bleeding   Ernestene Kiel, PharmD PGY1 Pharmacy Resident  Please check AMION for all [facility]acy phone numbers After 10:00 PM, call Main Pharmacy [phone]  09/05/2023 7:39 AM

## 2023-09-05 NOTE — Telephone Encounter (Signed)
Patient Product/process development scientist completed.    The patient is insured through [employer]. Patient has Medicare and is not eligible for a copay card, but may be able to apply for patient assistance, if available.    Ran test claim for Entresto 24-26 mg and the current 30 day co-pay is $161.76 due to being in Coverage Gap (donut hole).   This test claim was processed through [facility]- copay amounts may vary at other pharmacies due to pharmacy/plan contracts, or as the patient moves through the different stages of their insurance plan.     Roland Earl, CPHT Pharmacy Technician III Certified Patient Advocate [facility] Pharm[facility] Patient Advocate Team Direct Number: [phone]  Fax: [phone]

## 2023-09-05 NOTE — Progress Notes (Signed)
1 Day Post-Op Procedure(s) (LRB): RIGHT HEART CATH (N/A) Subjective: Feels much better  Objective: Vital signs in last 24 hours: Temp:  [97.6 F (36.4 C)-98.7 F (37.1 C)] 97.7 F (36.5 C) (11/19 0645) Pulse Rate:  [74-120] 93 (11/19 0830) Cardiac Rhythm: Normal sinus rhythm (11/19 0800) Resp:  [14-33] 14 (11/19 0830) BP: (88-186)/(45-106) 111/91 (11/19 0830) SpO2:  [88 %-97 %] 97 % (11/19 0830) Weight:  [92.4 kg] 92.4 kg (11/19 0620)  Hemodynamic parameters for last 24 hours: CVP:  [0 mmHg-25 mmHg] 0 mmHg  Intake/Output from previous day: 11/18 0701 - 11/19 0700 In: 542.1 [I.V.:542.1] Out: 2925 [Urine:2925] Intake/Output this shift: Total I/O In: 28.7 [I.V.:28.7] Out: -   General appearance: alert, cooperative, and no distress Neurologic: intact Heart: regular rate and rhythm Lungs: diminished breath sounds bibasilar Extremities: edema 1+  Lab Results: Recent Labs    09/04/23 0405 09/04/23 0930 09/05/23 0446  WBC 8.4  --  8.2  HGB 11.1* 12.9  12.9 11.2*  HCT 36.0 38.0  38.0 35.2*  PLT 279  --  278   BMET:  Recent Labs    09/04/23 0405 09/04/23 0930 09/05/23 0446  NA 132* 134*  134* 131*  K 4.3 4.7  4.7 4.4  CL 100  --  100  CO2 25  --  23  GLUCOSE 284*  --  263*  BUN 20  --  18  CREATININE 1.10*  --  1.02*  CALCIUM 8.9  --  8.9    PT/INR: No results for input(s): "LABPROT", "INR" in the last 72 hours. ABG    Component Value Date/Time   HCO3 24.5 09/04/2023 0930   HCO3 24.7 09/04/2023 0930   TCO2 26 09/04/2023 0930   TCO2 26 09/04/2023 0930   ACIDBASEDEF 1.0 09/04/2023 0930   ACIDBASEDEF 1.0 09/04/2023 0930   O2SAT 64.2 09/05/2023 0444   CBG (last 3)  Recent Labs    09/04/23 1547 09/04/23 2144 09/05/23 0648  GLUCAP 289* 276* 283*    Assessment/Plan: S/P Procedure(s) (LRB): RIGHT HEART CATH (N/A)  Severe multivessel CAD, severe MR Repeat RHC yesterday showed marked improvement in filling pressures Plan CABG, mitral repair/  replacement tomorrow I discussed the general nature of the procedure, including the need for general anesthesia, the incisions to be used, the use of cardiopulmonary bypass, and the use of drainage tubes and temporary pacemaker wires with Ms. Barletta.  We  discussed the expected hospital stay, overall recovery and short and long term outcomes. I informed her of the indications, risks, benefits and alternatives.  She understands the risks include, but are not limited to death, stroke, MI, DVT/PE, bleeding, possible need for transfusion, infections, cardiac arrhythmias, as well as other organ system dysfunction including respiratory, renal, or GI complications.   She accepts the risks and agrees to proceed.   For OR tomorrow AM  CBG still markedly elevated despite high doses of insulin- will initiate insulin drip with Endo-Tool   LOS: 8 days    [name] 09/05/2023

## 2023-09-05 NOTE — Progress Notes (Signed)
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  11/13: AFL w/ RVR. Started on IV amio + heparin gtt. Milrinone discontinued    11/14: TEE: EF 25%, RV severely reduced, severe LAE, 3-4+ MR 11/19: RHC- RA 2, PA 45/17 (29), PCWP 11 vwaves to 18, CO/CI (thermo) 4.62/2.4, PVR 2w, PAPi 14  Hematoma noted overnight at [facility] site, area marked.  Plan for CABG/MVR tomorrow.  Up in chair. Upbeat this morning. Mild cough, using IS. No SOB. No CP.   Objective:   Weight Range: 92.4 kg Body mass index is 34.97 kg/m.   Vital Signs:   Temp:  [97.6 F (36.4 C)-98.7 F (37.1 C)] 97.7 F (36.5 C) (11/19 0645) Pulse Rate:  [74-120] 86 (11/19 0915) Resp:  [14-33] 22 (11/19 0915) BP: (88-141)/(45-100) 113/56 (11/19 0915) SpO2:  [88 %-97 %] 93 % (11/19 0915) Weight:  [92.4 kg] 92.4 kg (11/19 0620) Last BM Date : 09/05/23  Weight change: Filed Weights   09/03/23 0500 09/04/23 0500 09/05/23 0620  Weight: 87.2 kg 92.3 kg 92.4 kg    Intake/Output:   Intake/Output Summary (Last 24 hours) at 09/05/2023 0942 Last data filed at 09/05/2023 0800 Gross per 24 hour  Intake 542.09 ml  Output 2925 ml  Net -2382.91 ml   Physical Exam    General: Sitting up in chair. Well appearing. No distress on RA HEENT: neck supple.   Cardiac: JVP not visible. S1 and S2 present. No murmurs or rub. Resp: Lung sounds clear and equal B/L, Bases diminished Abdomen: Soft, non-tender, non-distended. + BS. Extremities: Warm and dry. No rash, cyanosis.  No edema.  Neuro: Alert and oriented x3. Affect pleasant. Moves all extremities without difficulty. Lines/Devices:  LIJ CVL   Telemetry   SR 80s (personally reviewed)  Labs    CBC Recent Labs    09/04/23 0405 09/04/23 0930 09/05/23 0446  WBC 8.4  --  8.2  HGB 11.1* 12.9  12.9 11.2*  HCT 36.0 38.0   38.0 35.2*  MCV 80.5  --  80.7  PLT 279  --  278   Basic Metabolic Panel Recent Labs    16/10/96 0405 09/04/23 0930 09/05/23 0446  NA 132* 134*  134* 131*  K 4.3 4.7  4.7 4.4  CL 100  --  100  CO2 25  --  23  GLUCOSE 284*  --  263*  BUN 20  --  18  CREATININE 1.10*  --  1.02*  CALCIUM 8.9  --  8.9  MG 2.1  --  1.9   Liver Function Tests No results for input(s): "AST", "ALT", "ALKPHOS", "BILITOT", "PROT", "ALBUMIN" in the last 72 hours.  No results for input(s): "LIPASE", "AMYLASE" in the last 72 hours. Cardiac Enzymes No results for input(s): "CKTOTAL", "CKMB", "CKMBINDEX", "TROPONINI" in the last 72 hours.  BNP: BNP (last 3 results) No results for input(s): "BNP" in the last 8760 hours.  ProBNP (last 3 results) No results for input(s): "PROBNP" in the last 8760 hours.   D-Dimer No results for input(s): "DDIMER" in the last 72 hours. Hemoglobin A1C No results for input(s): "HGBA1C" in the last 72 hours.  Fasting Lipid Panel No results for input(s): "CHOL", "HDL", "LDLCALC", "TRIG", "CHOLHDL", "LDLDIRECT" in the last 72 hours. Thyroid Function Tests No results for input(s): "TSH", "T4TOTAL", "T3FREE", "THYROIDAB" in the last  72 hours.  Invalid input(s): "FREET3"  Imaging   VAS US DOPPLER PRE CABG  Result Date: 09/04/2023 PREOPERATIVE VASCULAR EVALUATION Patient Name:  BRADIE CANDIDO  Date of Exam:   09/04/2023 Medical Rec #: [id number]        Accession #:    [id number] Date of Birth: May 12, 1946        Patient Gender: F Patient Age:   77 years Exam Location:  [facility] Procedure:      VAS US DOPPLER PRE CABG Referring Phys: Viviann Spare HENDRICKSON --------------------------------------------------------------------------------  Indications:      Pre-CABG. Risk Factors:     Coronary artery disease. Limitations:      Central line bandages on left side of neck. Comparison Study: No prior exam. Performing Technologist: Fernande Bras  Examination Guidelines: A  complete evaluation includes B-mode imaging, spectral Doppler, color Doppler, and power Doppler as needed of all accessible portions of each vessel. Bilateral testing is considered an integral part of a complete examination. Limited examinations for reoccurring indications may be performed as noted.  Right Carotid Findings: +----------+--------+--------+--------+--------+--------+           PSV cm/sEDV cm/sStenosisDescribeComments +----------+--------+--------+--------+--------+--------+ CCA Prox  86      13                               +----------+--------+--------+--------+--------+--------+ CCA Distal58      13                               +----------+--------+--------+--------+--------+--------+ ICA Prox  56      13                               +----------+--------+--------+--------+--------+--------+ ICA Mid   66      19                               +----------+--------+--------+--------+--------+--------+ ICA Distal70      17                               +----------+--------+--------+--------+--------+--------+ ECA       92      9                                +----------+--------+--------+--------+--------+--------+ +----------+--------+-------+--------+------------+           PSV cm/sEDV cmsDescribeArm Pressure +----------+--------+-------+--------+------------+ Subclavian85      14                          +----------+--------+-------+--------+------------+ +---------+--------+--+--------+--+ VertebralPSV cm/s45EDV cm/s11 +---------+--------+--+--------+--+ Left Carotid Findings: +----------+--------+--------+--------+--------+-------------------------------+           PSV cm/sEDV cm/sStenosisDescribeComments                        +----------+--------+--------+--------+--------+-------------------------------+ CCA Prox                                  Proximal CCA is not visualized.  +----------+--------+--------+--------+--------+-------------------------------+ CCA Mid  Not visualized                  +----------+--------+--------+--------+--------+-------------------------------+ CCA Distal                                Not visualized                  +----------+--------+--------+--------+--------+-------------------------------+ ICA Prox  58      16                                                      +----------+--------+--------+--------+--------+-------------------------------+ ICA Mid   102     28                                                      +----------+--------+--------+--------+--------+-------------------------------+ ICA Distal119     36                                                      +----------+--------+--------+--------+--------+-------------------------------+ ECA       73      7                                                       +----------+--------+--------+--------+--------+-------------------------------+ +----------+--------+--------+--------+------------+ SubclavianPSV cm/sEDV cm/sDescribeArm Pressure +----------+--------+--------+--------+------------+           47      8                            +----------+--------+--------+--------+------------+ +---------+--------+--+--------+--+ VertebralPSV cm/s43EDV cm/s10 +---------+--------+--+--------+--+ Central line bandages ABI Findings: +------------------+-----+-----------+ Rt Pressure (mmHg)IndexWaveform    +------------------+-----+-----------+ 130                                +------------------+-----+-----------+ 152               1.17 multiphasic +------------------+-----+-----------+ 129               0.99 multiphasic +------------------+-----+-----------+ 112               0.86 Normal      +------------------+-----+-----------+ +------------------+-----+-----------+ Lt Pressure  (mmHg)IndexWaveform    +------------------+-----+-----------+ 120                                +------------------+-----+-----------+ 200               1.54 multiphasic +------------------+-----+-----------+ 255               1.96 multiphasic +------------------+-----+-----------+ 160               1.23 Normal      +------------------+-----+-----------+  Right Doppler Findings: +--------+--------+ Site  Pressure +--------+--------+ Brachial130      +--------+--------+  Left Doppler Findings: +--------+--------+ Site    Pressure +--------+--------+ [id number]      +--------+--------+   Summary: Right Carotid: Velocities in the right ICA are consistent with a 1-39% stenosis.                The ECA appears <50% stenosed. There was no evidence of thrombus,                dissection, atherosclerotic plaque or stenosis in the cervical                carotid system. Left Carotid: Velocities in the left ICA are consistent with a 1-39% stenosis.               The ECA appears <50% stenosed. Vertebrals:  Bilateral vertebral arteries demonstrate antegrade flow. Subclavians: Normal flow hemodynamics were seen in bilateral subclavian              arteries. Right ABI: Resting right ankle-brachial index is within normal range. The right toe-brachial index is normal. Left ABI: Resting left ankle-brachial index indicates noncompressible left lower extremity arteries. The left toe-brachial index is normal.  Electronically signed by Gerarda Fraction on 09/04/2023 at 4:21:02 PM.    Final     Medications:    Scheduled Medications:  aspirin EC  81 mg Oral Daily   bisacodyl  5 mg Oral Once   [START ON 09/06/2023] chlorhexidine  15 mL Mouth/Throat Once   Chlorhexidine Gluconate Cloth  6 each Topical Daily   Chlorhexidine Gluconate Cloth  6 each Topical Daily   Chlorhexidine Gluconate Cloth  6 each Topical Once   And   Chlorhexidine Gluconate Cloth  6 each Topical Once   [START ON 09/06/2023]  diazepam  2 mg Oral Once   ezetimibe  10 mg Oral Daily   gabapentin  100 mg Oral QHS   loratadine  10 mg Oral Daily   [START ON 09/06/2023] metoprolol tartrate  12.5 mg Oral Once   pantoprazole  40 mg Oral Daily   polyethylene glycol  17 g Oral Daily   rosuvastatin  40 mg Oral Daily   senna-docusate  2 tablet Oral QHS   sodium chloride flush  10-40 mL Intracatheter Q12H   sodium chloride flush  3 mL Intravenous Q12H   spironolactone  12.5 mg Oral Daily    Infusions:  amiodarone 30 mg/hr (09/05/23 0800)   heparin 1,100 Units/hr (09/05/23 0840)   insulin regular (NOVOLIN R) 100 Units in sodium chloride 0.9 % 100 mL (1 Units/mL) infusion 15 Units/hr (09/05/23 0917)    PRN Medications: acetaminophen, nitroGLYCERIN, ondansetron (ZOFRAN) IV, mouth rinse, sodium chloride flush, sodium chloride flush  Patient Profile   77 y/o female w/ h/o CAD s/p remote LAD and RCA PCI in 2011, Type 2DM and HLD, recently diagnosed w/ new systolic heart failure and severe MR by echo. 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 AHF team consulted for HF optimization prior to CABG + MVR.  Assessment/Plan   1. Acute Systolic Heart Failure w/ Low Output  - new. Prior Echo 2014 w/ normal EF 50-55%, normal RV - Echo 11/24 EF 25-30%, severe MR, RV low normal  - Ischemic CM. LHC w/ severe MVCAD.  - RHC c/w decompensated heart failure, severe LV dysfunction, and severe mitral regurgitation (RA 21, PAP 83/37/56, PCWP 36, CO 4.11, CI 2.04) - 11/19 RHC: RA  2, PA 45/17 (29), PCWP 11 vwaves to 18, CO/CI (thermo) 4.62/2.4, PVR 2w, PAPi 14 - TEE: EF 25%, RV severely reduced, severe LAE, 3-4+ MR - Now off milrinone following bout of AFL with RVR - Co-ox stable 64% - Low filling pressure noted on cath. RA 2. Hold diuresis. - Continue Spiro 12.5 mg daily  - Continue Entresto 24/26 - no ? blocker w/ recent low output - hold off on  SGLT2i w surgery - CABG/MVR tomorrow  2. Severe MR - appears ischemic w/ posterior leaflet tethering on echo  - cath with giant V waves of greater than 60 mmHg + elevated BiV filling pressures - TEE EF 25% 3-4+ MR  - CABG w/ MVR tomorrow    3. Severe MVCAD - CTO prox-dist RCA, 75% mid-dist LM, 95% ost-prox LCx, 25% ost-prox LAD - No s/s angina - ASA + statin  - CABG tomorrow  4. SVT/ AT  - converted to NSR on amio gtt  - continue amio gtt at 30/hr  - continue heparin gtt - keep K > 4.0, Mg > 2.0  - MV repair at time of CABG/MVR    5. Type 2DM  - Hgb A1c 7.8  - SSI  6. RIJ Hematoma - overnight post cath, area marked - hgb 11.2, stable  - d/w pharmD about heparin dosing, will aim for lower in therapeutic range  7. AKI: likely 2/2 diuresis + transient hypotension w/ SVT  Resolved.   Length of Stay: 8  [place] Fate Caster, NP  09/05/2023, 9:42 AM  Advanced Heart Failure Team Pager [phone] (M-F; 7a - 5p)  Please contact [facility] for night-coverage after hours (5p -7a ) and weekends on [email or web address]

## 2023-09-05 NOTE — Anesthesia Preprocedure Evaluation (Addendum)
Anesthesia Evaluation  Patient identified by MRN, date of birth, ID band Patient awake    Reviewed: Allergy & Precautions, NPO status , Patient's Chart, lab work & pertinent test results  Airway Mallampati: II  TM Distance: >3 FB Neck ROM: Full    Dental no notable dental hx.    Pulmonary shortness of breath, COPD   Pulmonary exam normal        Cardiovascular hypertension, Pt. on medications and Pt. on home beta blockers + CAD and + Cardiac Stents  + Valvular Problems/Murmurs MR  Rhythm:Regular Rate:Normal + Systolic murmurs ECHO 11/24:  1. Left ventricular ejection fraction, by estimation, is 25 to 30%. The  left ventricle has severely decreased function. The left ventricle  demonstrates global hypokinesis. The left ventricular internal cavity size  was mildly dilated. Left ventricular  diastolic parameters are indeterminate. There is severe akinesis of the  left ventricular, entire inferior wall.   2. Right ventricular systolic function is low normal. The right  ventricular size is normal. There is moderately elevated pulmonary artery  systolic pressure. The estimated right ventricular systolic pressure is  59.3 mmHg.   3. Left atrial size was moderately dilated.   4. Suspect ischemic MR with posterior leaflet tethering. The mitral valve  is abnormal. Severe mitral valve regurgitation.   5. The aortic valve is tricuspid. Aortic valve regurgitation is trivial.  Aortic valve sclerosis/calcification is present, without any evidence of  aortic stenosis.   6. The inferior vena cava is dilated in size with >50% respiratory  variability, suggesting right atrial pressure of 8 mmHg.    Cath 08/28/23: HEMODYNAMICS: RA:       2 mmHg (mean) RV:       44/1, 2 mmHg PA:       45/17 mmHg (29 mean) PCWP: 11 mmHg (mean) with v waves to 18    Estimated Fick CO/CI   4.46L/min, 2.32L/min/m2 Thermodilution CO/CI   4.62L/min, 2.4L/min/m2   TPG  18  mmHg     PVR  4 Wood Units PAPi  14   IMPRESSION: Significantly improved filling pressures from previous. Normalized cardiac output by thermodilution. 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   Neuro/Psych negative neurological ROS     GI/Hepatic Neg liver ROS,GERD  Medicated,,  Endo/Other  diabetes, Type 2, Oral Hypoglycemic Agents    Renal/GU negative Renal ROS  negative genitourinary   Musculoskeletal negative musculoskeletal ROS (+)    Abdominal Normal abdominal exam  (+)   Peds  Hematology Lab Results      Component                Value               Date                      WBC                      8.2                 09/05/2023                HGB                      11.2 (L)            09/05/2023  HCT                      35.2 (L)            09/05/2023                MCV                      80.7                09/05/2023                PLT                      278                 09/05/2023             Lab Results      Component                Value               Date                      NA                       131 (L)             09/05/2023                K                        4.4                 09/05/2023                CO2                      23                  09/05/2023                GLUCOSE                  263 (H)             09/05/2023                BUN                      18                  09/05/2023                CREATININE               1.02 (H)            09/05/2023                CALCIUM                  8.9                 09/05/2023                EGFR  56 (L)              08/24/2023                GFRNONAA                 57 (L)              09/05/2023              Anesthesia Other Findings   Reproductive/Obstetrics                             Anesthesia Physical Anesthesia Plan  ASA: 4  Anesthesia Plan: General    Post-op Pain Management:    Induction: Intravenous  PONV Risk Score and Plan: 3 and Ondansetron, Treatment may vary due to age or medical condition and Midazolam  Airway Management Planned: Mask and Oral ETT  Additional Equipment: Arterial line, CVP, PA Cath, TEE, 3D TEE and Ultrasound Guidance Line Placement  Intra-op Plan:   Post-operative Plan: Post-operative intubation/ventilation  Informed Consent: I have reviewed the patients History and Physical, chart, labs and discussed the procedure including the risks, benefits and alternatives for the proposed anesthesia with the patient or authorized representative who has indicated his/her understanding and acceptance.     Dental advisory given  Plan Discussed with: CRNA  Anesthesia Plan Comments:        Anesthesia Quick Evaluation

## 2023-09-06 ENCOUNTER — Inpatient Hospital Stay (HOSPITAL_COMMUNITY): Payer: Medicare HMO

## 2023-09-06 ENCOUNTER — Other Ambulatory Visit: Payer: Self-pay

## 2023-09-06 ENCOUNTER — Encounter (HOSPITAL_COMMUNITY): Payer: Self-pay

## 2023-09-06 ENCOUNTER — Encounter (HOSPITAL_COMMUNITY)
Admission: AD | Disposition: A | Discharge status: Home Health | Payer: Self-pay | Source: Ambulatory Visit | Attending: Thoracic Surgery (Cardiothoracic Vascular Surgery)

## 2023-09-06 ENCOUNTER — Inpatient Hospital Stay (HOSPITAL_COMMUNITY): Payer: Self-pay | Admitting: Anesthesiology

## 2023-09-06 ENCOUNTER — Encounter (HOSPITAL_COMMUNITY): Payer: Self-pay | Admitting: Certified Registered Nurse Anesthetist

## 2023-09-06 ENCOUNTER — Inpatient Hospital Stay (HOSPITAL_COMMUNITY)
Admission: AD | Disposition: A | Discharge status: Home Health | Payer: Self-pay | Source: Ambulatory Visit | Attending: Thoracic Surgery (Cardiothoracic Vascular Surgery)

## 2023-09-06 ENCOUNTER — Encounter (HOSPITAL_COMMUNITY): Payer: Self-pay | Admitting: Cardiovascular Disease

## 2023-09-06 DIAGNOSIS — I2510 Atherosclerotic heart disease of native coronary artery without angina pectoris: Secondary | ICD-10-CM | POA: Diagnosis not present

## 2023-09-06 DIAGNOSIS — I340 Nonrheumatic mitral (valve) insufficiency: Secondary | ICD-10-CM

## 2023-09-06 DIAGNOSIS — Z9889 Other specified postprocedural states: Secondary | ICD-10-CM

## 2023-09-06 DIAGNOSIS — I5023 Acute on chronic systolic (congestive) heart failure: Secondary | ICD-10-CM | POA: Diagnosis not present

## 2023-09-06 DIAGNOSIS — Z951 Presence of aortocoronary bypass graft: Secondary | ICD-10-CM

## 2023-09-06 HISTORY — PX: MITRAL VALVE REPAIR: SHX2039

## 2023-09-06 HISTORY — PX: TEE WITHOUT CARDIOVERSION: SHX5443

## 2023-09-06 HISTORY — PX: CORONARY ARTERY BYPASS GRAFT: SHX141

## 2023-09-06 LAB — CBC
HCT: 35.1 % — ABNORMAL LOW (ref 36.0–46.0)
HCT: 29 % — ABNORMAL LOW (ref 36.0–46.0)
HCT: 27.3 % — ABNORMAL LOW (ref 36.0–46.0)
Hemoglobin: 11 g/dL — ABNORMAL LOW (ref 12.0–15.0)
Hemoglobin: 9.5 g/dL — ABNORMAL LOW (ref 12.0–15.0)
Hemoglobin: 8.8 g/dL — ABNORMAL LOW (ref 12.0–15.0)
MCH: 25.1 pg — ABNORMAL LOW (ref 26.0–34.0)
MCH: 26.6 pg (ref 26.0–34.0)
MCH: 26.2 pg (ref 26.0–34.0)
MCHC: 31.3 g/dL (ref 30.0–36.0)
MCHC: 32.8 g/dL (ref 30.0–36.0)
MCHC: 32.2 g/dL (ref 30.0–36.0)
MCV: 80 fL (ref 80.0–100.0)
MCV: 81.2 fL (ref 80.0–100.0)
MCV: 81.3 fL (ref 80.0–100.0)
Platelets: 287 10*3/uL (ref 150–400)
Platelets: 151 10*3/uL (ref 150–400)
Platelets: 164 10*3/uL (ref 150–400)
RBC: 4.39 MIL/uL (ref 3.87–5.11)
RBC: 3.57 MIL/uL — ABNORMAL LOW (ref 3.87–5.11)
RBC: 3.36 MIL/uL — ABNORMAL LOW (ref 3.87–5.11)
RDW: 15.9 % — ABNORMAL HIGH (ref 11.5–15.5)
RDW: 15.3 % (ref 11.5–15.5)
RDW: 15.5 % (ref 11.5–15.5)
WBC: 9.9 10*3/uL (ref 4.0–10.5)
WBC: 16.9 10*3/uL — ABNORMAL HIGH (ref 4.0–10.5)
WBC: 17.4 10*3/uL — ABNORMAL HIGH (ref 4.0–10.5)
nRBC: 0 % (ref 0.0–0.2)
nRBC: 0 % (ref 0.0–0.2)
nRBC: 0 % (ref 0.0–0.2)

## 2023-09-06 LAB — GLUCOSE, CAPILLARY
Glucose-Capillary: 100 mg/dL — ABNORMAL HIGH (ref 70–99)
Glucose-Capillary: 103 mg/dL — ABNORMAL HIGH (ref 70–99)
Glucose-Capillary: 106 mg/dL — ABNORMAL HIGH (ref 70–99)
Glucose-Capillary: 113 mg/dL — ABNORMAL HIGH (ref 70–99)
Glucose-Capillary: 114 mg/dL — ABNORMAL HIGH (ref 70–99)
Glucose-Capillary: 121 mg/dL — ABNORMAL HIGH (ref 70–99)
Glucose-Capillary: 123 mg/dL — ABNORMAL HIGH (ref 70–99)
Glucose-Capillary: 163 mg/dL — ABNORMAL HIGH (ref 70–99)
Glucose-Capillary: 166 mg/dL — ABNORMAL HIGH (ref 70–99)
Glucose-Capillary: 180 mg/dL — ABNORMAL HIGH (ref 70–99)
Glucose-Capillary: 183 mg/dL — ABNORMAL HIGH (ref 70–99)
Glucose-Capillary: 188 mg/dL — ABNORMAL HIGH (ref 70–99)
Glucose-Capillary: 191 mg/dL — ABNORMAL HIGH (ref 70–99)
Glucose-Capillary: 197 mg/dL — ABNORMAL HIGH (ref 70–99)
Glucose-Capillary: 200 mg/dL — ABNORMAL HIGH (ref 70–99)

## 2023-09-06 LAB — POCT I-STAT 7, (LYTES, BLD GAS, ICA,H+H)
Acid-base deficit: 3 mmol/L — ABNORMAL HIGH (ref 0.0–2.0)
Acid-base deficit: 3 mmol/L — ABNORMAL HIGH (ref 0.0–2.0)
Acid-base deficit: 1 mmol/L (ref 0.0–2.0)
Acid-base deficit: 1 mmol/L (ref 0.0–2.0)
Acid-base deficit: 1 mmol/L (ref 0.0–2.0)
Acid-base deficit: 2 mmol/L (ref 0.0–2.0)
Acid-base deficit: 5 mmol/L — ABNORMAL HIGH (ref 0.0–2.0)
Acid-base deficit: 7 mmol/L — ABNORMAL HIGH (ref 0.0–2.0)
Bicarbonate: 22.9 mmol/L (ref 20.0–28.0)
Bicarbonate: 21 mmol/L (ref 20.0–28.0)
Bicarbonate: 23.2 mmol/L (ref 20.0–28.0)
Bicarbonate: 24 mmol/L (ref 20.0–28.0)
Bicarbonate: 23.2 mmol/L (ref 20.0–28.0)
Bicarbonate: 23.3 mmol/L (ref 20.0–28.0)
Bicarbonate: 22.1 mmol/L (ref 20.0–28.0)
Bicarbonate: 19.3 mmol/L — ABNORMAL LOW (ref 20.0–28.0)
Calcium, Ion: 1.24 mmol/L (ref 1.15–1.40)
Calcium, Ion: 0.96 mmol/L — ABNORMAL LOW (ref 1.15–1.40)
Calcium, Ion: 0.95 mmol/L — ABNORMAL LOW (ref 1.15–1.40)
Calcium, Ion: 0.87 mmol/L — CL (ref 1.15–1.40)
Calcium, Ion: 1.09 mmol/L — ABNORMAL LOW (ref 1.15–1.40)
Calcium, Ion: 1.16 mmol/L (ref 1.15–1.40)
Calcium, Ion: 1.09 mmol/L — ABNORMAL LOW (ref 1.15–1.40)
Calcium, Ion: 1.08 mmol/L — ABNORMAL LOW (ref 1.15–1.40)
HCT: 36 % (ref 36.0–46.0)
HCT: 25 % — ABNORMAL LOW (ref 36.0–46.0)
HCT: 20 % — ABNORMAL LOW (ref 36.0–46.0)
HCT: 26 % — ABNORMAL LOW (ref 36.0–46.0)
HCT: 24 % — ABNORMAL LOW (ref 36.0–46.0)
HCT: 26 % — ABNORMAL LOW (ref 36.0–46.0)
HCT: 27 % — ABNORMAL LOW (ref 36.0–46.0)
HCT: 26 % — ABNORMAL LOW (ref 36.0–46.0)
Hemoglobin: 12.2 g/dL (ref 12.0–15.0)
Hemoglobin: 8.5 g/dL — ABNORMAL LOW (ref 12.0–15.0)
Hemoglobin: 6.8 g/dL — CL (ref 12.0–15.0)
Hemoglobin: 8.8 g/dL — ABNORMAL LOW (ref 12.0–15.0)
Hemoglobin: 8.2 g/dL — ABNORMAL LOW (ref 12.0–15.0)
Hemoglobin: 8.8 g/dL — ABNORMAL LOW (ref 12.0–15.0)
Hemoglobin: 9.2 g/dL — ABNORMAL LOW (ref 12.0–15.0)
Hemoglobin: 8.8 g/dL — ABNORMAL LOW (ref 12.0–15.0)
O2 Saturation: 100 %
O2 Saturation: 100 %
O2 Saturation: 100 %
O2 Saturation: 100 %
O2 Saturation: 100 %
O2 Saturation: 100 %
O2 Saturation: 89 %
O2 Saturation: 95 %
Patient temperature: 36.3
Patient temperature: 37.7
Potassium: 4.8 mmol/L (ref 3.5–5.1)
Potassium: 5 mmol/L (ref 3.5–5.1)
Potassium: 5.2 mmol/L — ABNORMAL HIGH (ref 3.5–5.1)
Potassium: 5.4 mmol/L — ABNORMAL HIGH (ref 3.5–5.1)
Potassium: 4.4 mmol/L (ref 3.5–5.1)
Potassium: 5.5 mmol/L — ABNORMAL HIGH (ref 3.5–5.1)
Potassium: 4 mmol/L (ref 3.5–5.1)
Potassium: 3.7 mmol/L (ref 3.5–5.1)
Sodium: 136 mmol/L (ref 135–145)
Sodium: 137 mmol/L (ref 135–145)
Sodium: 138 mmol/L (ref 135–145)
Sodium: 140 mmol/L (ref 135–145)
Sodium: 138 mmol/L (ref 135–145)
Sodium: 139 mmol/L (ref 135–145)
Sodium: 142 mmol/L (ref 135–145)
Sodium: 142 mmol/L (ref 135–145)
TCO2: 24 mmol/L (ref 22–32)
TCO2: 22 mmol/L (ref 22–32)
TCO2: 24 mmol/L (ref 22–32)
TCO2: 25 mmol/L (ref 22–32)
TCO2: 24 mmol/L (ref 22–32)
TCO2: 24 mmol/L (ref 22–32)
TCO2: 24 mmol/L (ref 22–32)
TCO2: 21 mmol/L — ABNORMAL LOW (ref 22–32)
pCO2 arterial: 44 mm[Hg] (ref 32–48)
pCO2 arterial: 34 mm[Hg] (ref 32–48)
pCO2 arterial: 32.9 mm[Hg] (ref 32–48)
pCO2 arterial: 38.9 mm[Hg] (ref 32–48)
pCO2 arterial: 36.6 mm[Hg] (ref 32–48)
pCO2 arterial: 40.2 mm[Hg] (ref 32–48)
pCO2 arterial: 46.2 mm[Hg] (ref 32–48)
pCO2 arterial: 43.1 mm[Hg] (ref 32–48)
pH, Arterial: 7.325 — ABNORMAL LOW (ref 7.35–7.45)
pH, Arterial: 7.399 (ref 7.35–7.45)
pH, Arterial: 7.456 — ABNORMAL HIGH (ref 7.35–7.45)
pH, Arterial: 7.398 (ref 7.35–7.45)
pH, Arterial: 7.37 (ref 7.35–7.45)
pH, Arterial: 7.413 (ref 7.35–7.45)
pH, Arterial: 7.284 — ABNORMAL LOW (ref 7.35–7.45)
pH, Arterial: 7.264 — ABNORMAL LOW (ref 7.35–7.45)
pO2, Arterial: 316 mm[Hg] — ABNORMAL HIGH (ref 83–108)
pO2, Arterial: 411 mm[Hg] — ABNORMAL HIGH (ref 83–108)
pO2, Arterial: 394 mm[Hg] — ABNORMAL HIGH (ref 83–108)
pO2, Arterial: 391 mm[Hg] — ABNORMAL HIGH (ref 83–108)
pO2, Arterial: 224 mm[Hg] — ABNORMAL HIGH (ref 83–108)
pO2, Arterial: 419 mm[Hg] — ABNORMAL HIGH (ref 83–108)
pO2, Arterial: 62 mm[Hg] — ABNORMAL LOW (ref 83–108)
pO2, Arterial: 93 mm[Hg] (ref 83–108)

## 2023-09-06 LAB — POCT I-STAT, CHEM 8
BUN: 16 mg/dL (ref 8–23)
BUN: 15 mg/dL (ref 8–23)
BUN: 13 mg/dL (ref 8–23)
BUN: 14 mg/dL (ref 8–23)
BUN: 14 mg/dL (ref 8–23)
BUN: 14 mg/dL (ref 8–23)
Calcium, Ion: 1.25 mmol/L (ref 1.15–1.40)
Calcium, Ion: 1.23 mmol/L (ref 1.15–1.40)
Calcium, Ion: 0.99 mmol/L — ABNORMAL LOW (ref 1.15–1.40)
Calcium, Ion: 0.84 mmol/L — CL (ref 1.15–1.40)
Calcium, Ion: 0.91 mmol/L — ABNORMAL LOW (ref 1.15–1.40)
Calcium, Ion: 1.1 mmol/L — ABNORMAL LOW (ref 1.15–1.40)
Chloride: 101 mmol/L (ref 98–111)
Chloride: 101 mmol/L (ref 98–111)
Chloride: 102 mmol/L (ref 98–111)
Chloride: 102 mmol/L (ref 98–111)
Chloride: 103 mmol/L (ref 98–111)
Chloride: 103 mmol/L (ref 98–111)
Creatinine, Ser: 0.9 mg/dL (ref 0.44–1.00)
Creatinine, Ser: 0.9 mg/dL (ref 0.44–1.00)
Creatinine, Ser: 0.8 mg/dL (ref 0.44–1.00)
Creatinine, Ser: 0.8 mg/dL (ref 0.44–1.00)
Creatinine, Ser: 0.7 mg/dL (ref 0.44–1.00)
Creatinine, Ser: 0.8 mg/dL (ref 0.44–1.00)
Glucose, Bld: 142 mg/dL — ABNORMAL HIGH (ref 70–99)
Glucose, Bld: 154 mg/dL — ABNORMAL HIGH (ref 70–99)
Glucose, Bld: 132 mg/dL — ABNORMAL HIGH (ref 70–99)
Glucose, Bld: 143 mg/dL — ABNORMAL HIGH (ref 70–99)
Glucose, Bld: 135 mg/dL — ABNORMAL HIGH (ref 70–99)
Glucose, Bld: 200 mg/dL — ABNORMAL HIGH (ref 70–99)
HCT: 33 % — ABNORMAL LOW (ref 36.0–46.0)
HCT: 32 % — ABNORMAL LOW (ref 36.0–46.0)
HCT: 21 % — ABNORMAL LOW (ref 36.0–46.0)
HCT: 26 % — ABNORMAL LOW (ref 36.0–46.0)
HCT: 24 % — ABNORMAL LOW (ref 36.0–46.0)
HCT: 25 % — ABNORMAL LOW (ref 36.0–46.0)
Hemoglobin: 11.2 g/dL — ABNORMAL LOW (ref 12.0–15.0)
Hemoglobin: 10.9 g/dL — ABNORMAL LOW (ref 12.0–15.0)
Hemoglobin: 7.1 g/dL — ABNORMAL LOW (ref 12.0–15.0)
Hemoglobin: 8.8 g/dL — ABNORMAL LOW (ref 12.0–15.0)
Hemoglobin: 8.2 g/dL — ABNORMAL LOW (ref 12.0–15.0)
Hemoglobin: 8.5 g/dL — ABNORMAL LOW (ref 12.0–15.0)
Potassium: 4.9 mmol/L (ref 3.5–5.1)
Potassium: 4.9 mmol/L (ref 3.5–5.1)
Potassium: 5.4 mmol/L — ABNORMAL HIGH (ref 3.5–5.1)
Potassium: 5.6 mmol/L — ABNORMAL HIGH (ref 3.5–5.1)
Potassium: 5.9 mmol/L — ABNORMAL HIGH (ref 3.5–5.1)
Potassium: 4.4 mmol/L (ref 3.5–5.1)
Sodium: 136 mmol/L (ref 135–145)
Sodium: 135 mmol/L (ref 135–145)
Sodium: 136 mmol/L (ref 135–145)
Sodium: 138 mmol/L (ref 135–145)
Sodium: 139 mmol/L (ref 135–145)
Sodium: 139 mmol/L (ref 135–145)
TCO2: 27 mmol/L (ref 22–32)
TCO2: 24 mmol/L (ref 22–32)
TCO2: 27 mmol/L (ref 22–32)
TCO2: 26 mmol/L (ref 22–32)
TCO2: 27 mmol/L (ref 22–32)
TCO2: 23 mmol/L (ref 22–32)

## 2023-09-06 LAB — COMPREHENSIVE METABOLIC PANEL
ALT: 35 U/L (ref 0–44)
AST: 24 U/L (ref 15–41)
Albumin: 3.3 g/dL — ABNORMAL LOW (ref 3.5–5.0)
Alkaline Phosphatase: 41 U/L (ref 38–126)
Anion gap: 9 (ref 5–15)
BUN: 14 mg/dL (ref 8–23)
CO2: 24 mmol/L (ref 22–32)
Calcium: 9.3 mg/dL (ref 8.9–10.3)
Chloride: 103 mmol/L (ref 98–111)
Creatinine, Ser: 0.94 mg/dL (ref 0.44–1.00)
GFR, Estimated: >60 mL/min (ref >60)
Glucose, Bld: 105 mg/dL — ABNORMAL HIGH (ref 70–99)
Potassium: 4.1 mmol/L (ref 3.5–5.1)
Sodium: 136 mmol/L (ref 135–145)
Total Bilirubin: 0.4 mg/dL (ref <1.2)
Total Protein: 7.1 g/dL (ref 6.5–8.1)

## 2023-09-06 LAB — POCT I-STAT EG7
Acid-base deficit: 1 mmol/L (ref 0.0–2.0)
Bicarbonate: 24.8 mmol/L (ref 20.0–28.0)
Calcium, Ion: 1.06 mmol/L — ABNORMAL LOW (ref 1.15–1.40)
HCT: 26 % — ABNORMAL LOW (ref 36.0–46.0)
Hemoglobin: 8.8 g/dL — ABNORMAL LOW (ref 12.0–15.0)
O2 Saturation: 75 %
Potassium: 4.5 mmol/L (ref 3.5–5.1)
Sodium: 139 mmol/L (ref 135–145)
TCO2: 26 mmol/L (ref 22–32)
pCO2, Ven: 43 mm[Hg] — ABNORMAL LOW (ref 44–60)
pH, Ven: 7.369 (ref 7.25–7.43)
pO2, Ven: 41 mm[Hg] (ref 32–45)

## 2023-09-06 LAB — APTT
aPTT: 71 s — ABNORMAL HIGH (ref 24–36)
aPTT: 36 s (ref 24–36)

## 2023-09-06 LAB — BASIC METABOLIC PANEL
Anion gap: 11 (ref 5–15)
BUN: 16 mg/dL (ref 8–23)
CO2: 20 mmol/L — ABNORMAL LOW (ref 22–32)
Calcium: 7.5 mg/dL — ABNORMAL LOW (ref 8.9–10.3)
Chloride: 109 mmol/L (ref 98–111)
Creatinine, Ser: 1.34 mg/dL — ABNORMAL HIGH (ref 0.44–1.00)
GFR, Estimated: 41 mL/min — ABNORMAL LOW (ref >60)
Glucose, Bld: 187 mg/dL — ABNORMAL HIGH (ref 70–99)
Potassium: 3.8 mmol/L (ref 3.5–5.1)
Sodium: 140 mmol/L (ref 135–145)

## 2023-09-06 LAB — PROTIME-INR
INR: 1 (ref 0.8–1.2)
INR: 1.5 — ABNORMAL HIGH (ref 0.8–1.2)
Prothrombin Time: 13.5 s (ref 11.4–15.2)
Prothrombin Time: 17.9 s — ABNORMAL HIGH (ref 11.4–15.2)

## 2023-09-06 LAB — FIBRINOGEN: Fibrinogen: 237 mg/dL (ref 210–475)

## 2023-09-06 LAB — HEMOGLOBIN AND HEMATOCRIT, BLOOD
HCT: 25 % — ABNORMAL LOW (ref 36.0–46.0)
Hemoglobin: 8.3 g/dL — ABNORMAL LOW (ref 12.0–15.0)

## 2023-09-06 LAB — HEPARIN LEVEL (UNFRACTIONATED): Heparin Unfractionated: 0.54 [IU]/mL (ref 0.30–0.70)

## 2023-09-06 LAB — COOXEMETRY PANEL
Carboxyhemoglobin: 1.2 % (ref 0.5–1.5)
Methemoglobin: <0.7 % (ref 0.0–1.5)
O2 Saturation: 66 %
Total hemoglobin: 11.7 g/dL — ABNORMAL LOW (ref 12.0–16.0)

## 2023-09-06 LAB — MAGNESIUM
Magnesium: 2.2 mg/dL (ref 1.7–2.4)
Magnesium: 3.2 mg/dL — ABNORMAL HIGH (ref 1.7–2.4)

## 2023-09-06 LAB — PLATELET COUNT: Platelets: 147 10*3/uL — ABNORMAL LOW (ref 150–400)

## 2023-09-06 LAB — PREPARE RBC (CROSSMATCH)

## 2023-09-06 SURGERY — EXPLORATION POST OPERATIVE OPEN HEART
Anesthesia: General

## 2023-09-06 SURGERY — CORONARY ARTERY BYPASS GRAFTING (CABG)
Anesthesia: General | Site: Chest

## 2023-09-06 MED ORDER — FENTANYL CITRATE (PF) 250 MCG/5ML IJ SOLN
INTRAMUSCULAR | Status: AC
  Filled 2023-09-06: qty 5

## 2023-09-06 MED ORDER — ONDANSETRON HCL 4 MG/2ML IJ SOLN
INTRAMUSCULAR | Status: AC
  Filled 2023-09-06: qty 2

## 2023-09-06 MED ORDER — STERILE WATER FOR IRRIGATION IR SOLN
Status: DC | PRN
  Administered 2023-09-06: 2000 mL

## 2023-09-06 MED ORDER — NOREPINEPHRINE 4 MG/250ML-% IV SOLN
0.0000 ug/min | INTRAVENOUS | Status: DC
  Administered 2023-09-07: 13 ug/min via INTRAVENOUS
  Administered 2023-09-07: 9 ug/min via INTRAVENOUS
  Administered 2023-09-08: 3 ug/min via INTRAVENOUS
  Administered 2023-09-09: 4 ug/min via INTRAVENOUS
  Administered 2023-09-10: 2 ug/min via INTRAVENOUS
  Filled 2023-09-06 (×5): qty 250

## 2023-09-06 MED ORDER — SODIUM BICARBONATE 8.4 % IV SOLN: 50.0000 meq | Freq: Once | INTRAVENOUS | Status: DC

## 2023-09-06 MED ORDER — VASOPRESSIN 20 UNITS/100 ML INFUSION FOR SHOCK
0.0000 [IU]/min | INTRAVENOUS | Status: DC
  Administered 2023-09-06: 0.03 [IU]/min via INTRAVENOUS
  Filled 2023-09-06: qty 100

## 2023-09-06 MED ORDER — SODIUM CHLORIDE 0.45 % IV SOLN
INTRAVENOUS | Status: AC | PRN
Start: 2023-09-06 — End: 2023-09-07

## 2023-09-06 MED ORDER — TRAMADOL HCL 50 MG PO TABS
50.0000 mg | ORAL_TABLET | Freq: Four times a day (QID) | ORAL | Status: DC | PRN
  Administered 2023-09-11 – 2023-09-12 (×3): 50 mg via ORAL
  Filled 2023-09-06 (×4): qty 1

## 2023-09-06 MED ORDER — SODIUM CHLORIDE 0.9 % IV SOLN: INTRAVENOUS | Status: DC | PRN

## 2023-09-06 MED ORDER — SUGAMMADEX SODIUM 200 MG/2ML IV SOLN
INTRAVENOUS | Status: DC | PRN
  Administered 2023-09-06: 200 mg via INTRAVENOUS

## 2023-09-06 MED ORDER — ACETAMINOPHEN 160 MG/5ML PO SOLN
650.0000 mg | Freq: Once | ORAL | Status: AC
  Administered 2023-09-06: 650 mg
  Filled 2023-09-06: qty 20.3

## 2023-09-06 MED ORDER — LACTATED RINGERS IV SOLN: INTRAVENOUS | Status: DC | PRN

## 2023-09-06 MED ORDER — METOCLOPRAMIDE HCL 5 MG/ML IJ SOLN
10.0000 mg | Freq: Four times a day (QID) | INTRAMUSCULAR | Status: AC
  Administered 2023-09-06 – 2023-09-08 (×6): 10 mg via INTRAVENOUS
  Filled 2023-09-06 (×6): qty 2

## 2023-09-06 MED ORDER — BISACODYL 10 MG RE SUPP
10.0000 mg | Freq: Every day | RECTAL | Status: DC
  Administered 2023-09-20: 10 mg via RECTAL
  Filled 2023-09-06: qty 1

## 2023-09-06 MED ORDER — CEFAZOLIN SODIUM-DEXTROSE 2-4 GM/100ML-% IV SOLN
2.0000 g | Freq: Three times a day (TID) | INTRAVENOUS | Status: AC
Start: 2023-09-07 — End: 2023-09-08
  Administered 2023-09-07 – 2023-09-08 (×4): 2 g via INTRAVENOUS
  Filled 2023-09-06 (×3): qty 100

## 2023-09-06 MED ORDER — FENTANYL CITRATE (PF) 100 MCG/2ML IJ SOLN
INTRAMUSCULAR | Status: DC | PRN
  Administered 2023-09-06 (×2): 50 ug via INTRAVENOUS
  Administered 2023-09-06: 150 ug via INTRAVENOUS
  Administered 2023-09-06: 50 ug via INTRAVENOUS

## 2023-09-06 MED ORDER — PANTOPRAZOLE SODIUM 40 MG IV SOLR
40.0000 mg | Freq: Every day | INTRAVENOUS | Status: AC
Start: 2023-09-06 — End: 2023-09-08
  Administered 2023-09-06 – 2023-09-07 (×2): 40 mg via INTRAVENOUS
  Filled 2023-09-06 (×2): qty 10

## 2023-09-06 MED ORDER — INSULIN REGULAR(HUMAN) IN NACL 100-0.9 UT/100ML-% IV SOLN
INTRAVENOUS | Status: DC
  Administered 2023-09-07: 8 [IU]/h via INTRAVENOUS
  Administered 2023-09-07: 2.2 [IU]/h via INTRAVENOUS
  Administered 2023-09-07: 14 [IU]/h via INTRAVENOUS
  Filled 2023-09-06 (×2): qty 100

## 2023-09-06 MED ORDER — ROCURONIUM BROMIDE 10 MG/ML (PF) SYRINGE
PREFILLED_SYRINGE | INTRAVENOUS | Status: AC
  Filled 2023-09-06: qty 10

## 2023-09-06 MED ORDER — VANCOMYCIN HCL IN DEXTROSE 1-5 GM/200ML-% IV SOLN
1000.0000 mg | Freq: Once | INTRAVENOUS | Status: AC
  Administered 2023-09-06: 1000 mg via INTRAVENOUS
  Filled 2023-09-06: qty 200

## 2023-09-06 MED ORDER — SODIUM BICARBONATE 4.2 % IV SOLN
50.0000 meq | Freq: Once | INTRAVENOUS | Status: DC
  Filled 2023-09-06: qty 100

## 2023-09-06 MED ORDER — LACTATED RINGERS IV SOLN: INTRAVENOUS | Status: AC

## 2023-09-06 MED ORDER — LIDOCAINE 2% (20 MG/ML) 5 ML SYRINGE
INTRAMUSCULAR | Status: AC
  Filled 2023-09-06: qty 5

## 2023-09-06 MED ORDER — MORPHINE SULFATE (PF) 2 MG/ML IV SOLN
1.0000 mg | INTRAVENOUS | Status: DC | PRN
  Administered 2023-09-10: 2 mg via INTRAVENOUS
  Filled 2023-09-06: qty 1

## 2023-09-06 MED ORDER — MAGNESIUM SULFATE 4 GM/100ML IV SOLN
4.0000 g | Freq: Once | INTRAVENOUS | Status: AC
  Administered 2023-09-06: 4 g via INTRAVENOUS
  Filled 2023-09-06: qty 100

## 2023-09-06 MED ORDER — SODIUM CHLORIDE (PF) 0.9 % IJ SOLN
OROMUCOSAL | Status: DC | PRN
  Administered 2023-09-06 (×4): 4 mL via TOPICAL

## 2023-09-06 MED ORDER — SODIUM CHLORIDE 0.9% FLUSH
3.0000 mL | Freq: Two times a day (BID) | INTRAVENOUS | Status: DC
  Administered 2023-09-07 – 2023-09-20 (×22): 3 mL via INTRAVENOUS

## 2023-09-06 MED ORDER — ONDANSETRON HCL 4 MG/2ML IJ SOLN: 4.0000 mg | Freq: Four times a day (QID) | INTRAMUSCULAR | Status: DC | PRN

## 2023-09-06 MED ORDER — ROCURONIUM BROMIDE 100 MG/10ML IV SOLN
INTRAVENOUS | Status: DC | PRN
  Administered 2023-09-06: 70 mg via INTRAVENOUS
  Administered 2023-09-06: 30 mg via INTRAVENOUS
  Administered 2023-09-06: 50 mg via INTRAVENOUS
  Administered 2023-09-06: 30 mg via INTRAVENOUS
  Administered 2023-09-06: 50 mg via INTRAVENOUS

## 2023-09-06 MED ORDER — NITROGLYCERIN IN D5W 200-5 MCG/ML-% IV SOLN: 0.0000 ug/min | INTRAVENOUS | Status: DC

## 2023-09-06 MED ORDER — SODIUM CHLORIDE 0.9 % IV SOLN: 250.0000 mL | INTRAVENOUS | Status: AC

## 2023-09-06 MED ORDER — MIDAZOLAM HCL (PF) 10 MG/2ML IJ SOLN
INTRAMUSCULAR | Status: AC
  Filled 2023-09-06: qty 2

## 2023-09-06 MED ORDER — ALBUMIN HUMAN 5 % IV SOLN
250.0000 mL | INTRAVENOUS | Status: DC | PRN
Start: 2023-09-06 — End: 2023-09-21
  Administered 2023-09-06 (×4): 12.5 g via INTRAVENOUS
  Filled 2023-09-06: qty 250

## 2023-09-06 MED ORDER — POTASSIUM CHLORIDE 10 MEQ/50ML IV SOLN: 10.0000 meq | INTRAVENOUS | Status: AC

## 2023-09-06 MED ORDER — ASPIRIN 81 MG PO CHEW
324.0000 mg | CHEWABLE_TABLET | Freq: Every day | ORAL | Status: DC
  Filled 2023-09-06: qty 4

## 2023-09-06 MED ORDER — VASOPRESSIN 20 UNIT/ML IV SOLN
INTRAVENOUS | Status: AC
  Filled 2023-09-06: qty 1

## 2023-09-06 MED ORDER — DEXTROSE 50 % IV SOLN: 0.0000 mL | INTRAVENOUS | Status: DC | PRN

## 2023-09-06 MED ORDER — EPINEPHRINE HCL 5 MG/250ML IV SOLN IN NS: 0.5000 ug/min | INTRAVENOUS | Status: DC

## 2023-09-06 MED ORDER — MIDAZOLAM HCL (PF) 5 MG/ML IJ SOLN
INTRAMUSCULAR | Status: DC | PRN
  Administered 2023-09-06: 2 mg via INTRAVENOUS
  Administered 2023-09-06: 1 mg via INTRAVENOUS

## 2023-09-06 MED ORDER — SODIUM CHLORIDE 0.9 % IV SOLN: INTRAVENOUS | Status: AC

## 2023-09-06 MED ORDER — PANTOPRAZOLE SODIUM 40 MG PO TBEC
40.0000 mg | DELAYED_RELEASE_TABLET | Freq: Every day | ORAL | Status: DC
  Administered 2023-09-08 – 2023-09-21 (×14): 40 mg via ORAL
  Filled 2023-09-06 (×14): qty 1

## 2023-09-06 MED ORDER — MIDAZOLAM HCL 2 MG/2ML IJ SOLN: 2.0000 mg | INTRAMUSCULAR | Status: DC | PRN

## 2023-09-06 MED ORDER — PROTAMINE SULFATE 10 MG/ML IV SOLN
INTRAVENOUS | Status: DC | PRN
  Administered 2023-09-06: 350 mg via INTRAVENOUS

## 2023-09-06 MED ORDER — ACETAMINOPHEN 160 MG/5ML PO SOLN
1000.0000 mg | Freq: Four times a day (QID) | ORAL | Status: AC
  Administered 2023-09-06: 1000 mg
  Filled 2023-09-06: qty 40.6

## 2023-09-06 MED ORDER — CEFAZOLIN SODIUM-DEXTROSE 2-4 GM/100ML-% IV SOLN
2.0000 g | Freq: Three times a day (TID) | INTRAVENOUS | Status: DC
  Administered 2023-09-06: 2 g via INTRAVENOUS
  Filled 2023-09-06 (×2): qty 100

## 2023-09-06 MED ORDER — HEPARIN SODIUM (PORCINE) 1000 UNIT/ML IJ SOLN
INTRAMUSCULAR | Status: AC
  Filled 2023-09-06: qty 10

## 2023-09-06 MED ORDER — 0.9 % SODIUM CHLORIDE (POUR BTL) OPTIME
TOPICAL | Status: DC | PRN
  Administered 2023-09-06: 5000 mL

## 2023-09-06 MED ORDER — CHLORHEXIDINE GLUCONATE CLOTH 2 % EX PADS
6.0000 | MEDICATED_PAD | Freq: Every day | CUTANEOUS | Status: DC
  Administered 2023-09-04 – 2023-09-21 (×17): 6 via TOPICAL

## 2023-09-06 MED ORDER — CHLORHEXIDINE GLUCONATE 0.12 % MT SOLN
15.0000 mL | OROMUCOSAL | Status: AC
  Administered 2023-09-06: 15 mL via OROMUCOSAL
  Filled 2023-09-06: qty 15

## 2023-09-06 MED ORDER — ASPIRIN 325 MG PO TBEC
325.0000 mg | DELAYED_RELEASE_TABLET | Freq: Every day | ORAL | Status: DC
  Administered 2023-09-07 – 2023-09-09 (×3): 325 mg via ORAL
  Filled 2023-09-06 (×3): qty 1

## 2023-09-06 MED ORDER — NOREPINEPHRINE BITARTRATE 1 MG/ML IV SOLN
INTRAVENOUS | Status: DC | PRN
  Administered 2023-09-06: .5 mL via INTRAVENOUS
  Administered 2023-09-06 (×2): 2 mL via INTRAVENOUS
  Administered 2023-09-06: .5 mL via INTRAVENOUS
  Administered 2023-09-06: 4 mL via INTRAVENOUS

## 2023-09-06 MED ORDER — PLASMA-LYTE A IV SOLN
INTRAVENOUS | Status: DC | PRN
  Administered 2023-09-06: 1000 mL

## 2023-09-06 MED ORDER — SODIUM BICARBONATE 8.4 % IV SOLN
50.0000 meq | Freq: Once | INTRAVENOUS | Status: AC
Start: 2023-09-06 — End: 2023-09-06
  Administered 2023-09-06: 50 meq via INTRAVENOUS

## 2023-09-06 MED ORDER — DOCUSATE SODIUM 100 MG PO CAPS
200.0000 mg | ORAL_CAPSULE | Freq: Every day | ORAL | Status: DC
  Administered 2023-09-07 – 2023-09-20 (×12): 200 mg via ORAL
  Filled 2023-09-06 (×15): qty 2

## 2023-09-06 MED ORDER — METOPROLOL TARTRATE 25 MG/10 ML ORAL SUSPENSION: 12.5000 mg | Freq: Two times a day (BID) | ORAL | Status: DC

## 2023-09-06 MED ORDER — TRANEXAMIC ACID (OHS) BOLUS VIA INFUSION
15.0000 mg/kg | INTRAVENOUS | Status: AC
  Administered 2023-09-06: 1386 mg via INTRAVENOUS

## 2023-09-06 MED ORDER — ACETAMINOPHEN 500 MG PO TABS
1000.0000 mg | ORAL_TABLET | Freq: Four times a day (QID) | ORAL | Status: AC
  Administered 2023-09-07 – 2023-09-11 (×19): 1000 mg via ORAL
  Filled 2023-09-06 (×18): qty 2

## 2023-09-06 MED ORDER — PROPOFOL 10 MG/ML IV BOLUS
INTRAVENOUS | Status: AC
  Filled 2023-09-06: qty 20

## 2023-09-06 MED ORDER — POVIDONE-IODINE 7.5 % EX SOLN
CUTANEOUS | Status: DC | PRN
  Administered 2023-09-06: 2 via TOPICAL

## 2023-09-06 MED ORDER — POVIDONE-IODINE 10 % EX OINT
TOPICAL_OINTMENT | CUTANEOUS | Status: DC | PRN
  Administered 2023-09-06: 2 via TOPICAL

## 2023-09-06 MED ORDER — HEPARIN SODIUM (PORCINE) 1000 UNIT/ML IJ SOLN
INTRAMUSCULAR | Status: DC | PRN
  Administered 2023-09-06: 10000 [IU] via INTRAVENOUS
  Administered 2023-09-06: 35000 [IU] via INTRAVENOUS
  Administered 2023-09-06: 2000 [IU] via INTRAVENOUS

## 2023-09-06 MED ORDER — METOPROLOL TARTRATE 5 MG/5ML IV SOLN: 2.5000 mg | INTRAVENOUS | Status: DC | PRN

## 2023-09-06 MED ORDER — OXYCODONE HCL 5 MG PO TABS
5.0000 mg | ORAL_TABLET | ORAL | Status: DC | PRN
  Administered 2023-09-07: 10 mg via ORAL
  Administered 2023-09-11: 5 mg via ORAL
  Filled 2023-09-06: qty 1
  Filled 2023-09-06: qty 2

## 2023-09-06 MED ORDER — PHENYLEPHRINE HCL-NACL 20-0.9 MG/250ML-% IV SOLN: 0.0000 ug/min | INTRAVENOUS | Status: DC

## 2023-09-06 MED ORDER — MILRINONE LACTATE IN DEXTROSE 20-5 MG/100ML-% IV SOLN
0.1250 ug/kg/min | INTRAVENOUS | Status: DC
  Administered 2023-09-06 – 2023-09-07 (×2): 0.3 ug/kg/min via INTRAVENOUS
  Administered 2023-09-08: 0.125 ug/kg/min via INTRAVENOUS
  Filled 2023-09-06 (×3): qty 100

## 2023-09-06 MED ORDER — SODIUM BICARBONATE 8.4 % IV SOLN
INTRAVENOUS | Status: AC
  Administered 2023-09-06: 100 meq via INTRAVENOUS
  Filled 2023-09-06: qty 100

## 2023-09-06 MED ORDER — ASPIRIN 81 MG PO CHEW
324.0000 mg | CHEWABLE_TABLET | Freq: Once | ORAL | Status: AC
  Administered 2023-09-06: 324 mg via ORAL
  Filled 2023-09-06: qty 4

## 2023-09-06 MED ORDER — DEXMEDETOMIDINE HCL IN NACL 400 MCG/100ML IV SOLN
0.0000 ug/kg/h | INTRAVENOUS | Status: DC
  Administered 2023-09-06: 0.7 ug/kg/h via INTRAVENOUS
  Filled 2023-09-06 (×2): qty 100

## 2023-09-06 MED ORDER — SODIUM CHLORIDE 0.9% FLUSH: 3.0000 mL | INTRAVENOUS | Status: DC | PRN

## 2023-09-06 MED ORDER — SODIUM BICARBONATE 8.4 % IV SOLN
50.0000 meq | Freq: Once | INTRAVENOUS | Status: DC
Start: 2023-09-06 — End: 2023-09-06

## 2023-09-06 MED ORDER — HEMOSTATIC AGENTS (NO CHARGE) OPTIME
TOPICAL | Status: DC | PRN
  Administered 2023-09-06: 1 via TOPICAL

## 2023-09-06 MED ORDER — BISACODYL 5 MG PO TBEC
10.0000 mg | DELAYED_RELEASE_TABLET | Freq: Every day | ORAL | Status: DC
  Administered 2023-09-07 – 2023-09-19 (×9): 10 mg via ORAL
  Filled 2023-09-06 (×13): qty 2

## 2023-09-06 MED ORDER — ALBUMIN HUMAN 5 % IV SOLN
12.5000 g | Freq: Once | INTRAVENOUS | Status: AC
  Administered 2023-09-06: 12.5 g via INTRAVENOUS
  Filled 2023-09-06: qty 250

## 2023-09-06 MED ORDER — PROPOFOL 10 MG/ML IV BOLUS
INTRAVENOUS | Status: DC | PRN
  Administered 2023-09-06: 100 mg via INTRAVENOUS

## 2023-09-06 MED ORDER — METOPROLOL TARTRATE 12.5 MG HALF TABLET
12.5000 mg | ORAL_TABLET | Freq: Two times a day (BID) | ORAL | Status: DC
  Filled 2023-09-06: qty 1

## 2023-09-06 MED ORDER — TRANEXAMIC ACID 1000 MG/10ML IV SOLN: 1.5000 mg/kg/h | INTRAVENOUS | Status: DC

## 2023-09-06 MED ORDER — SODIUM BICARBONATE 8.4 % IV SOLN: 100.0000 meq | Freq: Once | INTRAVENOUS | Status: AC

## 2023-09-06 MED ORDER — AMIODARONE HCL IN DEXTROSE 360-4.14 MG/200ML-% IV SOLN
30.0000 mg/h | INTRAVENOUS | Status: AC
  Administered 2023-09-06 – 2023-09-08 (×4): 30 mg/h via INTRAVENOUS
  Filled 2023-09-06 (×4): qty 200

## 2023-09-06 SURGICAL SUPPLY — 109 items
ADAPTER CARDIO PERF ANTE/RETRO (ADAPTER) ×2 IMPLANT
BAG DECANTER FOR FLEXI CONT (MISCELLANEOUS) ×2 IMPLANT
BLADE STERNUM SYSTEM 6 (BLADE) ×2 IMPLANT
BLADE SURG 11 STRL SS (BLADE) IMPLANT
BLADE SURG 15 STRL LF DISP TIS (BLADE) ×2 IMPLANT
BNDG ELASTIC 4INX 5YD STR LF (GAUZE/BANDAGES/DRESSINGS) IMPLANT
BNDG ELASTIC 6INX 5YD STR LF (GAUZE/BANDAGES/DRESSINGS) IMPLANT
BNDG GAUZE DERMACEA FLUFF 4 (GAUZE/BANDAGES/DRESSINGS) ×2 IMPLANT
CANISTER SUCT 3000ML PPV (MISCELLANEOUS) ×2 IMPLANT
CANN PRFSN 3/8XCNCT ST RT ANG (MISCELLANEOUS) ×2
CANNULA ARTERIAL NVNT 3/8 22FR (MISCELLANEOUS) IMPLANT
CANNULA EZ GLIDE AORTIC 21FR (CANNULA) ×2 IMPLANT
CANNULA GUNDRY RCSP 15FR (MISCELLANEOUS) IMPLANT
CANNULA LEFT HEART VENT 20FR (CATHETERS) IMPLANT
CANNULA PRFSN 3/8XCNCT RT ANG (MISCELLANEOUS) IMPLANT
CANNULA SUMP PERICARDIAL (CANNULA) ×2 IMPLANT
CANNULA VRC MALB SNGL STG 36FR (MISCELLANEOUS) IMPLANT
CATH CPB KIT HENDRICKSON (MISCELLANEOUS) ×2 IMPLANT
CATH ROBINSON RED A/P 18FR (CATHETERS) ×2 IMPLANT
CATH THORACIC 36FR (CATHETERS) ×2 IMPLANT
CATH THORACIC 36FR RT ANG (CATHETERS) ×2 IMPLANT
CLIP FOGARTY SPRING 6M (CLIP) IMPLANT
CLIP TI WIDE RED SMALL 24 (CLIP) IMPLANT
CONN 1/2X1/2X1/2 BEN (MISCELLANEOUS) ×2 IMPLANT
CONN ST 3/8 X 1/2 (MISCELLANEOUS) ×4 IMPLANT
CONTAINER PROTECT SURGISLUSH (MISCELLANEOUS) ×4 IMPLANT
DERMABOND ADVANCED .7 DNX12 (GAUZE/BANDAGES/DRESSINGS) IMPLANT
DEVICE SUT CK QUICK LOAD MINI (Prosthesis & Implant Heart) IMPLANT
DRAPE SRG 135X102X78XABS (DRAPES) ×2 IMPLANT
DRAPE WARM FLUID 44X44 (DRAPES) ×2 IMPLANT
DRSG COVADERM 4X14 (GAUZE/BANDAGES/DRESSINGS) ×2 IMPLANT
ELECT REM PT RETURN 9FT ADLT (ELECTROSURGICAL) ×4
ELECTRODE REM PT RTRN 9FT ADLT (ELECTROSURGICAL) ×4 IMPLANT
FELT TEFLON 1X6 (MISCELLANEOUS) ×4 IMPLANT
GAUZE SPONGE 4X4 12PLY STRL (GAUZE/BANDAGES/DRESSINGS) ×4 IMPLANT
GLOVE BIO SURGEON STRL SZ 6.5 (GLOVE) IMPLANT
GLOVE BIOGEL PI IND STRL 6 (GLOVE) IMPLANT
GLOVE SS BIOGEL STRL SZ 7.5 (GLOVE) ×2 IMPLANT
GLOVE SURG SIGNA 7.5 PF LTX (GLOVE) ×6 IMPLANT
GOWN STRL REUS W/ TWL LRG LVL3 (GOWN DISPOSABLE) ×8 IMPLANT
GOWN STRL REUS W/ TWL XL LVL3 (GOWN DISPOSABLE) ×4 IMPLANT
HEMOSTAT POWDER SURGIFOAM 1G (HEMOSTASIS) ×6 IMPLANT
HEMOSTAT SURGICEL 2X14 (HEMOSTASIS) ×2 IMPLANT
KIT BASIN OR (CUSTOM PROCEDURE TRAY) ×2 IMPLANT
KIT SUCTION CATH 14FR (SUCTIONS) ×4 IMPLANT
KIT SUT CK MINI COMBO 4X17 (Prosthesis & Implant Heart) IMPLANT
KIT TURNOVER KIT B (KITS) ×2 IMPLANT
KIT VASOVIEW HEMOPRO 2 VH 4000 (KITS) ×2 IMPLANT
LINE VENT (MISCELLANEOUS) IMPLANT
LOOP VASCLR EXTRA MAXI WHITE (MISCELLANEOUS) IMPLANT
LOOPS VASCLR EXTRA MAXI WHITE (MISCELLANEOUS) ×2 IMPLANT
MARKER GRAFT CORONARY BYPASS (MISCELLANEOUS) ×6 IMPLANT
NDL AORTIC AIR ASPIRATING (NEEDLE) IMPLANT
NEEDLE AORTIC AIR ASPIRATING (NEEDLE) ×2 IMPLANT
NS IRRIG 1000ML POUR BTL (IV SOLUTION) ×10 IMPLANT
PACK E OPEN HEART (SUTURE) ×2 IMPLANT
PACK OPEN HEART (CUSTOM PROCEDURE TRAY) ×2 IMPLANT
PAD ARMBOARD 7.5X6 YLW CONV (MISCELLANEOUS) ×4 IMPLANT
PAD ELECT DEFIB RADIOL ZOLL (MISCELLANEOUS) ×2 IMPLANT
PENCIL BUTTON HOLSTER BLD 10FT (ELECTRODE) ×2 IMPLANT
POSITIONER HEAD DONUT 9IN (MISCELLANEOUS) ×2 IMPLANT
PUNCH AORTIC ROTATE 4.5MM 8IN (MISCELLANEOUS) IMPLANT
RING MCCARTHY ADAMS M30 (Prosthesis & Implant Heart) IMPLANT
SET MPS 3-ND DEL (MISCELLANEOUS) IMPLANT
SPONGE T-LAP 18X18 ~~LOC~~+RFID (SPONGE) ×8 IMPLANT
SPONGE T-LAP 4X18 ~~LOC~~+RFID (SPONGE) ×2 IMPLANT
STOPCOCK 4 WAY LG BORE MALE ST (IV SETS) IMPLANT
SUPPORT HEART JANKE-BARRON (MISCELLANEOUS) ×2 IMPLANT
SUT BONE WAX W31G (SUTURE) ×2 IMPLANT
SUT EB EXC GRN/WHT 2-0 D/A SH (SUTURE) ×4
SUT ETHIBOND 2 0 SH 36X2 (SUTURE) ×4 IMPLANT
SUT ETHIBOND 2 0 V4 (SUTURE) IMPLANT
SUT GORETEX CV 4 TH 22 36 (SUTURE) IMPLANT
SUT MNCRL AB 4-0 PS2 18 (SUTURE) IMPLANT
SUT PROLENE 3 0 SH DA (SUTURE) ×2 IMPLANT
SUT PROLENE 3 0 SH1 36 (SUTURE) ×2 IMPLANT
SUT PROLENE 4 0 SH DA (SUTURE) IMPLANT
SUT PROLENE 4-0 RB1 .5 CRCL 36 (SUTURE) ×4 IMPLANT
SUT PROLENE 5 0 C 1 36 (SUTURE) ×4 IMPLANT
SUT PROLENE 5 0 CC1 (SUTURE) ×2 IMPLANT
SUT PROLENE 6 0 C 1 30 (SUTURE) ×4 IMPLANT
SUT PROLENE 7 0 BV 1 (SUTURE) IMPLANT
SUT PROLENE 7 0 BV1 MDA (SUTURE) ×2 IMPLANT
SUT SILK 1 MH (SUTURE) ×4 IMPLANT
SUT SILK 1 TIES 10X30 (SUTURE) ×2 IMPLANT
SUT SILK 2 0 SH CR/8 (SUTURE) ×4 IMPLANT
SUT SILK 2-0 18XBRD TIE 12 (SUTURE) ×2 IMPLANT
SUT SILK 3 0 SH CR/8 (SUTURE) ×2 IMPLANT
SUT SILK 4-0 18XBRD TIE 12 (SUTURE) ×2 IMPLANT
SUT STEEL 6MS V (SUTURE) ×2 IMPLANT
SUT STEEL SZ 6 DBL 3X14 BALL (SUTURE) ×2 IMPLANT
SUT TEM PAC WIRE 2 0 SH (SUTURE) ×8 IMPLANT
SUT VIC AB 1 CTX36XBRD ANBCTR (SUTURE) ×4 IMPLANT
SUT VIC AB 3-0 SH 27X BRD (SUTURE) IMPLANT
SUTURE EB EXC GRN/WHT 2-0 D/A (SUTURE) ×2 IMPLANT
SYR BULB IRRIG 60ML STRL (SYRINGE) IMPLANT
SYSTEM SAHARA CHEST DRAIN ATS (WOUND CARE) ×2 IMPLANT
TAPE CLOTH 2X10 TAN LF (GAUZE/BANDAGES/DRESSINGS) IMPLANT
TAPE PAPER 2X10 WHT MICROPORE (GAUZE/BANDAGES/DRESSINGS) IMPLANT
TOWEL GREEN STERILE (TOWEL DISPOSABLE) ×2 IMPLANT
TOWEL GREEN STERILE FF (TOWEL DISPOSABLE) ×2 IMPLANT
TRAY FOLEY SLVR 16FR TEMP STAT (SET/KITS/TRAYS/PACK) ×2 IMPLANT
TUBE CONNECTING 12X1/4 (SUCTIONS) IMPLANT
TUBING ANTICOAG CELL SAVER (IV SETS) IMPLANT
TUBING LAP HI FLOW INSUFFLATIO (TUBING) ×2 IMPLANT
UNDERPAD 30X36 HEAVY ABSORB (UNDERPADS AND DIAPERS) ×2 IMPLANT
VASCULAR TIE EXTRA MAXI WHITE (MISCELLANEOUS) ×2
VRC MALLEABLE SINGLE STG 36FR (MISCELLANEOUS) ×2
WATER STERILE IRR 1000ML POUR (IV SOLUTION) ×4 IMPLANT

## 2023-09-06 NOTE — Interval H&P Note (Signed)
History and Physical Interval Note:  09/06/2023 7:20 AM  Diana [name]  has presented today for surgery, with the diagnosis of CAD SEVERE MR.  The various methods of treatment have been discussed with the patient and family. After consideration of risks, benefits and other options for treatment, the patient has consented to  Procedure(s): CORONARY ARTERY BYPASS GRAFTING (CABG) (N/A) MITRAL VALVE REPAIR or replacement (MVR) (N/A) TRANSESOPHAGEAL ECHOCARDIOGRAM (N/A) as a surgical intervention.  The patient's history has been reviewed, patient examined, no change in status, stable for surgery.  I have reviewed the patient's chart and labs.  Questions were answered to the patient's satisfaction.     [name]

## 2023-09-06 NOTE — Anesthesia Procedure Notes (Signed)
Arterial Line Insertion Start/End11/20/2024 7:55 AM, 09/06/2023 8:02 AM Performed by: Alease Medina, CRNA, CRNA  Patient location: Pre-op. Preanesthetic checklist: patient identified, IV checked, site marked, risks and benefits discussed, surgical consent, monitors and equipment checked, pre-op evaluation, timeout performed and anesthesia consent Lidocaine 1% used for infiltration radial was placed Catheter size: 20 G Hand hygiene performed  and maximum sterile barriers used   Attempts: 1 Procedure performed without using ultrasound guided technique. Following insertion, dressing applied and Biopatch. Post procedure assessment: normal and unchanged  Patient tolerated the procedure well with no immediate complications. Additional procedure comments: Arterial line placed by SRNA under supervision by CRNA Mikhael Hendriks.

## 2023-09-06 NOTE — Progress Notes (Signed)
      [address]       [name] [address]             [phone]      S/ CABG x 4, mitral repair  Intubated, sedated  BP 129/60   Pulse 91   Temp 97.9 F (36.6 C) (Oral)   Resp 16   Ht 5\' 4"  (1.626 m)   Wt 92.4 kg   SpO2 92%   BMI 34.97 kg/m  53/25 CI 2.36 On milrinone 0.375, epi 5, norepi 5, vasopressin 0.03   Intake/Output Summary (Last 24 hours) at 09/06/2023 1759 Last data filed at 09/06/2023 1703 Gross per 24 hour  Intake 3870.72 ml  Output 5113 ml  Net -1242.28 ml   Minimal CT output  Hgb 27, PLT 151K K 4.0  Doing well early postop  Diana Spare C. Dorris Fetch, MD [facility] [phone]

## 2023-09-06 NOTE — Brief Op Note (Addendum)
08/28/2023 - 09/06/2023  1:57 PM  PATIENT:  Diana [name]  77 y.o. female  PRE-OPERATIVE DIAGNOSIS:  1. CORONARY ARTERY DISEASE 2. SEVERE MITRAL VALVE REGURGITATION  POST-OPERATIVE DIAGNOSIS:  CORONARY ARTERY DISEASE 2. SEVERE MITRAL VALVE REGURGITATION  PROCEDURE:  TRANSESOPHAGEAL ECHOCARDIOGRAM, CORONARY ARTERY BYPASS GRAFTING (CABG) TIMES FOUR (LIMA to LAD, SVG to DIAGONAL, SVG to OM, SVG to ACUTE MARGINAL) USING LEFT INTERNAL MAMMARY ARTERY AND RIGHT GREATER SAPHENOUS VEIN, HARVESTED ENDOSCOPICALLY, MITRAL VALVE REPAIR (USING MODEL # 4100, SERIAL # [id number], Size CARPENTIER-MCCARTHY-ADAMS ANNUOPLASTY RING)  Vein harvest time: 40 min Vein prep time:  15 min FINDINGS: PDA too small to graft. Acute marginal also small  SURGEON:  Surgeons and Role:  * [name], MD - Primary  PHYSICIAN ASSISTANT: Doree Fudge PA-C  ASSISTANTS: Valene Bors  ANESTHESIA:   general  EBL:  Per perfusion, anesthesia  DRAINS:  Chest tubes placed in the mediastinal and pleural spaces    COUNTS CORRECT:  YES  DICTATION: .Dragon Dictation  PLAN OF CARE: Admit to inpatient   PATIENT DISPOSITION:  ICU - intubated and hemodynamically stable.   Delay start of Pharmacological VTE agent (>24hrs) due to surgical blood loss or risk of bleeding: no  BASELINE WEIGHT:92.4 kg

## 2023-09-06 NOTE — Progress Notes (Signed)
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  11/13: AFL w/ RVR. Started on IV amio + heparin gtt. Milrinone discontinued    11/14: TEE: EF 25%, RV severely reduced, severe LAE, 3-4+ MR 11/19: RHC- RA 2, PA 45/17 (29), PCWP 11 vwaves to 18, CO/CI (thermo) 4.62/2.4, PVR 2w, PAPi 14  S/p CABG X 4 (LIMA to LAD, SVG to diagonal, SVG to OM, SVG to acute marginal) + mitral valve repair by Dr. Dorris Fetch today.  Seen in ICU post-op.  Sedated on vent.   Currently on 0.375 milrinone + 4 NE + 5 Epi + 0.03 Vaso.  SWAN #s PA 47/21 CO 3.65 CI 1.85  Received 2 u RBCs   Objective:   Weight Range: 92.4 kg Body mass index is 34.97 kg/m.   Vital Signs:   Temp:  [97.9 F (36.6 C)-98.5 F (36.9 C)] 97.9 F (36.6 C) (11/20 0715) Pulse Rate:  [74-106] 91 (11/20 1637) Resp:  [16-30] 16 (11/20 1637) BP: (91-153)/(44-116) 129/60 (11/20 0900) SpO2:  [80 %-98 %] 92 % (11/20 1637) FiO2 (%):  [50 %] 50 % (11/20 1637) Weight:  [92.4 kg] 92.4 kg (11/20 0635) Last BM Date : 09/05/23  Weight change: Filed Weights   09/04/23 0500 09/05/23 0620 09/06/23 0635  Weight: 92.3 kg 92.4 kg 92.4 kg    Intake/Output:   Intake/Output Summary (Last 24 hours) at 09/06/2023 1712 Last data filed at 09/06/2023 1703 Gross per 24 hour  Intake 3870.72 ml  Output 5113 ml  Net -1242.28 ml   Physical Exam    General:  Sedated on vent HEENT: normal Neck: supple. L internal jugular SWAN.  Cor: Regular rate & rhythm. No rubs, gallops or murmurs. Dressing over sternum. +  mediastinal chest tubes Lungs: clear Abdomen: soft, nontender, nondistended.  Extremities: no cyanosis, clubbing, rash, 2+ edema Neuro: Intubated and sedated.  Telemetry   SR 90s  Labs    CBC Recent Labs    09/06/23 0422 09/06/23 0940 09/06/23 1343 09/06/23 1344  09/06/23 1637 09/06/23 1638  WBC 9.9  --   --   --  16.9*  --   HGB 11.0*   < > 8.3*   < > 9.5* 9.2*  HCT 35.1*   < > 25.0*   < > 29.0* 27.0*  MCV 80.0  --   --   --  81.2  --   PLT 287  --  147*  --  151  --    < > = values in this interval not displayed.   Basic Metabolic Panel Recent Labs    64/33/29 0446 09/06/23 0422 09/06/23 0940 09/06/23 1405 09/06/23 1443 09/06/23 1513 09/06/23 1516 09/06/23 1638  NA 131* 136   < > 139   < > 139 139 142  K 4.4 4.1   < > 5.9*   < > 4.4 4.4 4.0  CL 100 103   < > 103  --  103  --   --   CO2 23 24  --   --   --   --   --   --   GLUCOSE 263* 105*   < > 135*  --  200*  --   --   BUN 18 14   < > 14  --  14  --   --  CREATININE 1.02* 0.94   < > 0.70  --  0.80  --   --   CALCIUM 8.9 9.3  --   --   --   --   --   --   MG 1.9 2.2  --   --   --   --   --   --    < > = values in this interval not displayed.   Liver Function Tests Recent Labs    09/06/23 0422  AST 24  ALT 35  ALKPHOS 41  BILITOT 0.4  PROT 7.1  ALBUMIN 3.3*    No results for input(s): "LIPASE", "AMYLASE" in the last 72 hours. Cardiac Enzymes No results for input(s): "CKTOTAL", "CKMB", "CKMBINDEX", "TROPONINI" in the last 72 hours.  BNP: BNP (last 3 results) No results for input(s): "BNP" in the last 8760 hours.  ProBNP (last 3 results) No results for input(s): "PROBNP" in the last 8760 hours.   D-Dimer No results for input(s): "DDIMER" in the last 72 hours. Hemoglobin A1C No results for input(s): "HGBA1C" in the last 72 hours.  Fasting Lipid Panel No results for input(s): "CHOL", "HDL", "LDLCALC", "TRIG", "CHOLHDL", "LDLDIRECT" in the last 72 hours. Thyroid Function Tests No results for input(s): "TSH", "T4TOTAL", "T3FREE", "THYROIDAB" in the last 72 hours.  Invalid input(s): "FREET3"  Imaging   EP STUDY  Result Date: 09/06/2023 See surgical note for result.  DG CHEST PORT 1 VIEW  Result Date: 09/06/2023 CLINICAL DATA:  Preop. EXAM: PORTABLE  CHEST 1 VIEW COMPARISON:  11/13/2016 FINDINGS: Enlarged cardiopericardial silhouette. No edema. Mild interstitial prominence. No pneumothorax, effusion or consolidation. Bandlike areas of opacity left retrocardiac. Scar or atelectasis is favored. Left IJ catheter in place with tip along the central SVC above the right atrium. Overlapping cardiac leads. Degenerative changes of the spine. IMPRESSION: Left IJ line in place.  No pneumothorax. Enlarged heart. Electronically Signed   By: Karen Kays M.D.   On: 09/06/2023 11:40    Medications:    Scheduled Medications:  [START ON 09/07/2023] acetaminophen  1,000 mg Oral Q6H   Or   [START ON 09/07/2023] acetaminophen (TYLENOL) oral liquid 160 mg/5 mL  1,000 mg Per Tube Q6H   acetaminophen (TYLENOL) oral liquid 160 mg/5 mL  650 mg Per Tube Once   aspirin  324 mg Oral Once   [START ON 09/07/2023] aspirin EC  325 mg Oral Daily   Or   [START ON 09/07/2023] aspirin  324 mg Per Tube Daily   [START ON 09/07/2023] bisacodyl  10 mg Oral Daily   Or   [START ON 09/07/2023] bisacodyl  10 mg Rectal Daily   [START ON 09/07/2023] docusate sodium  200 mg Oral Daily   ezetimibe  10 mg Oral Daily   gabapentin  100 mg Oral QHS   insulin   Intravenous To OR   loratadine  10 mg Oral Daily   metoCLOPramide (REGLAN) injection  10 mg Intravenous Q6H   metoprolol tartrate  12.5 mg Oral BID   Or   metoprolol tartrate  12.5 mg Per Tube BID   [START ON 09/08/2023] pantoprazole  40 mg Oral Daily   pantoprazole (PROTONIX) IV  40 mg Intravenous QHS   rosuvastatin  40 mg Oral Daily   sodium bicarbonate  50 mEq Intravenous Once   [START ON 09/07/2023] sodium chloride flush  3 mL Intravenous Q12H    Infusions:  sodium chloride     [START ON 09/07/2023] sodium chloride  sodium chloride 10 mL/hr at 09/06/23 1703   albumin human      ceFAZolin (ANCEF) IV     dexmedetomidine (PRECEDEX) IV infusion 0.7 mcg/kg/hr (09/06/23 1703)   epinephrine 5 mcg/min (09/06/23 1703)    insulin 4.2 Units/hr (09/06/23 1649)   lactated ringers     lactated ringers 20 mL/hr at 09/06/23 1703   magnesium sulfate 20 mL/hr at 09/06/23 1703   milrinone 0.375 mcg/kg/min (09/06/23 1703)   nitroGLYCERIN     norepinephrine (LEVOPHED) Adult infusion 5 mcg/min (09/06/23 1703)   phenylephrine (NEO-SYNEPHRINE) Adult infusion     potassium chloride     vancomycin     vasopressin 0.03 Units/min (09/06/23 1703)    PRN Medications: sodium chloride, albumin human, dextrose, metoprolol tartrate, midazolam, morphine injection, ondansetron (ZOFRAN) IV, oxyCODONE, [START ON 09/07/2023] sodium chloride flush, traMADol  Patient Profile   77 y/o female w/ h/o CAD s/p remote LAD and RCA PCI in 2011, Type 2DM and HLD, recently diagnosed w/ new systolic heart failure and severe MR by echo. 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 AHF team consulted for HF optimization prior to CABG + MVR.  Assessment/Plan   1. Acute Systolic Heart Failure w/ Low Output  - new. Prior Echo 2014 w/ normal EF 50-55%, normal RV - Echo 11/24 EF 25-30%, severe MR, RV low normal  - Ischemic CM. LHC w/ severe MVCAD.  - RHC c/w decompensated heart failure, severe LV dysfunction, and severe mitral regurgitation (RA 21, PAP 83/37/56, PCWP 36, CO 4.11, CI 2.04) - 11/19 RHC: RA 2, PA 45/17 (29), PCWP 11 vwaves to 18, CO/CI (thermo) 4.62/2.4, PVR 2w, PAPi 14 - TEE: EF 25%, RV severely reduced, severe LAE, 3-4+ MR - S/p CABG X 4 and mitral valve repair today. CI 1.85 on 4 NE, 0.375 milrinone, 5 Epi + 0.03 Vaso. Will review inotropes with MD. - May consider starting diuresis tomorrow.  2. Severe MR - appears ischemic w/ posterior leaflet tethering on echo  - cath with giant V waves of greater than 60 mmHg + elevated BiV filling pressures - TEE EF 25% 3-4+ MR  - s/p mitral valve repair at time of CABG    3. 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 SVT/ AT  - converted to NSR on amio gtt  - continue amio gtt at 30/hr while on inotrope support - off heparin d/t post-op - keep K > 4.0, Mg > 2.0    5. Type 2DM  - Hgb A1c 7.8  - Insulin gtt per TCTS  6. RIJ Hematoma - post cath - follow  7. AKI: likely 2/2 diuresis + transient hypotension w/ SVT  Resolved.   8. Acute blood loss anemia - From surgery - Received 2 u RBCs - CBC in am - Follow CT output  Length of Stay: 9  Hideko Esselman N, PA-C  09/06/2023, 5:12 PM  Advanced Heart Failure Team Pager [phone] (M-F; 7a - 5p)  Please contact [facility] for night-coverage after hours (5p -7a ) and weekends on [email or web address]

## 2023-09-06 NOTE — Anesthesia Procedure Notes (Signed)
Central Venous Catheter Insertion Performed by: Atilano Median, DO, anesthesiologist Start/End11/20/2024 7:55 AM, 09/06/2023 8:10 AM Patient location: Pre-op. Preanesthetic checklist: patient identified, IV checked, site marked, risks and benefits discussed, surgical consent, monitors and equipment checked, pre-op evaluation, timeout performed and anesthesia consent Position: Trendelenburg Lidocaine 1% used for infiltration and patient sedated Hand hygiene performed  and maximum sterile barriers used  Catheter size: 9 Fr Central line was placed.MAC introducer Procedure performed using ultrasound guided technique. Ultrasound Notes:anatomy identified, needle tip was noted to be adjacent to the nerve/plexus identified, no ultrasound evidence of intravascular and/or intraneural injection and image(s) printed for medical record Attempts: 1 Following insertion, line sutured, dressing applied and Biopatch. Post procedure assessment: blood return through all ports, free fluid flow and no air  Patient tolerated the procedure well with no immediate complications. Additional procedure comments: - patient with right sided neck hematoma from prior line placement. Left sided triple lumen in situ. Attempted wire exchange over catheter with resistance. Decision made to pull out LIJ catheter and perform fresh puncture with 20F mac introducer under sterile conditions. Atraumatic. Marland Kitchen

## 2023-09-06 NOTE — Progress Notes (Signed)
   09/06/23 2150  Adult Ventilator Settings  Vent Mode PSV;CPAP  FiO2 (%) 40 %  Pressure Support 10 cmH20  PEEP 5 cmH20   Placed pt on PS for rapid wean

## 2023-09-06 NOTE — Discharge Instructions (Addendum)

## 2023-09-06 NOTE — Discharge Summary (Addendum)
[address]       [place] [address]             [phone]    Physician Discharge Summary  Patient ID: Diana [name] MRN: [id number] DOB/AGE: 75/15/1947 75 y.o.  Admit date: 08/28/2023 Discharge date: 09/22/2023  Admission Diagnoses:  Patient Active Problem List   Diagnosis Date Noted   S/P mitral valve repair 09/06/2023   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/2023   Coronary artery disease 09/06/2023   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, <= 40%) (HCC) 08/28/2023   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, <= 40%) and combined systolic and diastolic dysfunction (HCC) 08/28/2023   COPD exacerbation (HCC) 11/13/2016   Diabetes mellitus with diabetic neuropathy (HCC) 08/24/2015   Dyslipidemia    Diastolic dysfunction-garde 1    CAD-staged LAD DES (July 2011), RCA DES (Aug 2011) 05/24/2013   DM2 (diabetes mellitus, type 2) (HCC) 05/24/2013   HTN (hypertension) 05/24/2013   Obesity (BMI 30-39.9) 05/24/2013     Discharge Diagnoses:  Patient Active Problem List   Diagnosis Date Noted   S/P mitral valve repair 09/06/2023   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/2023   Coronary artery disease 09/06/2023   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, <= 40%) (HCC) 08/28/2023   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, <= 40%) and combined systolic and diastolic dysfunction (HCC) 08/28/2023   COPD exacerbation (HCC) 11/13/2016   Diabetes mellitus with diabetic neuropathy (HCC) 08/24/2015   Dyslipidemia    Diastolic dysfunction-garde 1    CAD-staged LAD DES (July 2011), RCA DES (Aug 2011) 05/24/2013   DM2 (diabetes mellitus, type 2) (HCC) 05/24/2013   HTN (hypertension) 05/24/2013   Obesity (BMI 30-39.9) 05/24/2013     Discharged Condition: Stable  HPI:  THis is a 77 year old female with a past medical history of CAD (hx of PTCA stent and tandem stent to RCA 2011), HTN,  HLD, and T2DM. She recently presented to her cardiologist due to chest discomfort with exertion. Over the past year she has experienced worsening chest discomfort and diaphoresis with exertion that is relieved with sublingual nitroglycerin. She also admits to worsening shortness of breath over the past few weeks with exertion and left tooth pain that accompanies the chest discomfort. She denies LOC. July 26, 2023 she was recommended to undergo cardiac catheterization but she deferred this due to her husbands knee surgery. She instead underwent a nuclear medicine PET/CT cardiac perfusion study that was markedly abnormal and noted an EF of 20%, findings were consistent with infarction and peri-infarction ischemia in the RCA distribution. Cardiac catheterization on 11/11 showed severe multivessel CAD with total occlusion of the RCA, severe stenosis of the left main, subtotal occlusion of the ostial circumflex and severe stenosis of the mid LAD and diagonal 1 as well as severe pulmonary hypertension and severe mitral regurgitation. Echocardiogram on 11/07 showed LVEF 25-30% with suspected ischemic severe mitral regurgitation and posterior leaflet teathering, no other valvular abnormalities were noted. Moderate elevation in pulmonary artery systolic pressures were again noted.  Coronary bypass grafting and mitral valve repair/replacement is indicated for survival benefit and relief of symptoms. Dr. Dorris Fetch discussed possible risks, benefits, and complications of the surgery with the patient and she agreed to proceed with surgery. Pre operative carotid duplex US showed no significant internal carotid artery stenosis bilaterally.  Hospital Course: Patient underwent a CABG  x 4 and mitral valve repair (Annuloplasty ring, size 30 mm). She was transported from the OR to [facility] ICU in stable condition. Patient was extubated early post op day one. She was weaned off Amiodarone, Nor epinephrine, and Milrinone drips. She was  initially A paced. She had a small pneumothorax and air leak so chest tube remained for a few days and then were removed. She was put on Lovenox for DVT prophylaxis. She was put on a Lasix drip to help with edema. She was transitioned off the Insulin drip. Her pre op HGA1C was 7.8. Metformin will be restarted later in her hospital stay. She will need close medical follow up after discharge.She was transitioned to oral Amiodarone as converted to sinus rhythm. She was restarted on Coreg. Unfortunately, she went back into a fib and put back on the Amiodarone drip.  She was on Lovenox for DVT prophylaxis. Creatinine did increase to 1.91 on 11/24. On CXR 11/27 she was found to have  layering left pleural effusion. She was transitioned to oral Amiodarone on 11/26.  Left thoracentesis done 11/27 yielded 600cc of serosanguinous fluid, aeration improved following. He had AKI post op. Creatinine went as high as 1.81. In time, creatinine continued to improve. She was started on Apixaban on 12/02. Milrinone and Lasix gtt was discontinued on 11/29. She developed lower extremity pitting edema and cool extremities. PICC line was placed, coox was 54.6%. She was diuresed and Spironolactone was started. She was felt stable for transfer to the progressive unit on 12/01. GDMT was started and titrated as able. Insulin was adjusted due to hypoglycemia. As of 12/04, per advanced heart failure, she will be on Spironolactone 25 mg daily and Losartan 25 mg daily. She has been tolerating a diet and has had multiple bowel movements. All wounds are clean, dry, healing without signs of infection. She has been ambulating on room air with good oxygenation. Advanced heart failure is finished titrating medications and per note by AHF, patient is felt stable for discharge. She has been mostly on room air but per recent documentation, requires 2 L of oxygen (oxygen saturation documented as 87% during ambulation) to maintain oxygenation of > or = to  90% with ambulation;home oxygen ordered. Daughter will weigh daily and watch for increased LE edema or increased shortness of breath.  Her mobility is slowly improving and as discussed with Dr. Dorris Fetch, she is felt stable for discharge today with home health and home PT and OT.  Consults: cardiology and advanced heart failure  Significant Diagnostic Studies:   Narrative & Impression  CLINICAL DATA:  Follow-up exam.  Pleural effusion.   EXAM: CHEST - 2 VIEW   COMPARISON:  09/13/2023.  CT, 11/13/2016.   FINDINGS: Cardiac silhouette is mildly enlarged. Stable changes from prior cardiac surgery. No mediastinal or hilar masses.   Left mid to lower lung opacities are stable from the prior study. Remainder of the lungs is clear.   Small left pleural effusion blunts the posterior left costophrenic angle.   No pneumothorax.   Left internal jugular introducer sheath is stable.   No significant change from the previous day's study.   IMPRESSION: 1. No significant change from the previous day's study. 2. Small left pleural effusion with left mid to lower lung opacities consistent with atelectasis and/or pneumonia.     Electronically Signed   By: Amie Portland M.D.   On: 09/14/2023 10:05   Narrative & Impression  CLINICAL DATA:  Follow-up exam.  Pleural effusion.  EXAM: CHEST - 2 VIEW   COMPARISON:  09/13/2023.  CT, 11/13/2016.   FINDINGS: Cardiac silhouette is mildly enlarged. Stable changes from prior cardiac surgery. No mediastinal or hilar masses.   Left mid to lower lung opacities are stable from the prior study. Remainder of the lungs is clear.   Small left pleural effusion blunts the posterior left costophrenic angle.   No pneumothorax.   Left internal jugular introducer sheath is stable.   No significant change from the previous day's study.   IMPRESSION: 1. No significant change from the previous day's study. 2. Small left pleural effusion with left  mid to lower lung opacities consistent with atelectasis and/or pneumonia.     Electronically Signed   By: Amie Portland M.D.   On: 09/14/2023 10:05   Narrative & Impression  CLINICAL DATA:  [id number] Pneumothorax [id number]   EXAM: PORTABLE CHEST 1 VIEW   COMPARISON:  September 09, 2023   FINDINGS: The cardiomediastinal silhouette is unchanged in contour.Status post median sternotomy and cardiac valve replacement. Midline surgical drain and LEFT-sided chest tube. LEFT IJ sheath. No pleural effusion. Decreased conspicuity of bilateral pneumothoraces with a trace residual RIGHT-sided pneumothorax. Patchy LEFT basilar heterogeneous opacities, similar comparison to prior. Bilateral subcutaneous air   IMPRESSION: 1. Decreased conspicuity of bilateral pneumothoraces with a trace residual RIGHT-sided pneumothorax. 2. Patchy LEFT basilar heterogeneous opacities, similar comparison to prior.     Electronically Signed   By: Meda Klinefelter M.D.   On: 09/10/2023 09:15    Treatments: surgery:  1.  Median sternotomy, extracorporeal circulation, coronary artery bypass grafting x4 (left internal mammary artery to LAD, saphenous vein graft to first diagonal, saphenous vein graft to obtuse marginal, saphenous vein to acute marginal).   2.  Endoscopic vein harvest right leg.   3.  Mitral valve repair with [place] annuloplasty ring 30 mm (model number [id number], serial number [id number]) by Dr. Dorris Fetch on 09/06/2023.  Discharge Exam: Blood pressure (!) 114/51, pulse 83, temperature 97.9 F (36.6 C), temperature source Oral, resp. rate 19, height 5\' 4"  (1.626 m), weight 95.8 kg, SpO2 95%.  Cardiovascular: RRR, no murmur Pulmonary: Slightly diminished bibasilar Abdomen: Soft, non tender, bowel sounds present. Extremities: Trace bilateral lower extremity edema. Wounds: Clean and dry.  No erythema or signs of infection.  Discharge Medications:  The patient has been discharged  on:   1.Beta Blocker:  Yes [   ]                              No   [ x  ]                              If No, reason:Low EF  2.Ace Inhibitor/ARB: Yes [ x  ]                                     No  [    ]                                     If No, reason:  3.Statin:   Yes [ x  ]  No  [   ]                  If No, reason:  4.Ecasa:  Yes  [ x  ]                  No   [   ]                  If No, reason:  Patient had ACS upon admission:  Plavix/P2Y12 inhibitor: Yes [   ]                                      No  [ x  ]     Discharge Instructions     Amb Referral to Cardiac Rehabilitation   Complete by: As directed    Diagnosis:  CABG Valve Repair     Valve: Mitral   CABG X ___: 4   After initial evaluation and assessments completed: Virtual Based Care may be provided alone or in conjunction with Phase 2 Cardiac Rehab based on patient barriers.: Yes   Intensive Cardiac Rehabilitation (ICR) [facility] location only OR Traditional Cardiac Rehabilitation (TCR) *If criteria for ICR are not met will enroll in TCR [facility] only): Yes      Allergies as of 09/21/2023       Reactions   Lisinopril Cough   Sulfa Antibiotics Hives        Medication List     STOP taking these medications    aspirin 81 MG tablet Replaced by: aspirin EC 81 MG tablet   carvedilol 25 MG tablet Commonly known as: COREG   clopidogrel 75 MG tablet Commonly known as: PLAVIX   hydrochlorothiazide 25 MG tablet Commonly known as: HYDRODIURIL   isosorbide dinitrate 30 MG tablet Commonly known as: ISORDIL   losartan-hydrochlorothiazide 50-12.5 MG tablet Commonly known as: HYZAAR   naproxen sodium 220 MG tablet Commonly known as: ALEVE   nitroGLYCERIN 0.4 MG SL tablet Commonly known as: NITROSTAT       TAKE these medications    acetaminophen 325 MG tablet Commonly known as: TYLENOL Take 2 tablets (650 mg total) by mouth every 6 (six) hours as needed for mild pain (pain score  1-3).   amiodarone 200 MG tablet Commonly known as: PACERONE Take 1 tablet (200 mg total) by mouth daily.   aspirin EC 81 MG tablet Take 1 tablet (81 mg total) by mouth daily. Swallow whole. Replaces: aspirin 81 MG tablet   BD Insulin Syringe U/F 1/2Unit 31G X 5/16" 0.3 ML Misc Generic drug: Insulin Syringe-Needle U-100   Biotin 5000 MCG Caps Take 1 capsule by mouth daily.   cyanocobalamin 1000 MCG tablet Commonly known as: VITAMIN B12 Take 2,000 mcg by mouth daily.   Eliquis 5 MG Tabs tablet Generic drug: apixaban Take 1 tablet (5 mg total) by mouth 2 (two) times daily.   ezetimibe 10 MG tablet Commonly known as: ZETIA TAKE 1 TABLET BY MOUTH EVERY DAY   Farxiga 10 MG Tabs tablet Generic drug: dapagliflozin propanediol Take 1 tablet (10 mg total) by mouth daily.   fish oil-omega-3 fatty acids 1000 MG capsule Take 1 g by mouth daily.   furosemide 40 MG tablet Commonly known as: LASIX Take 1 tablet (40 mg total) by mouth daily.   gabapentin 100 MG capsule Commonly known as: NEURONTIN Take 100 mg by mouth  See admin instructions. Take 100 mg at bedtime, may take an additional 100 mg twice daily as needed for pain   loratadine 10 MG tablet Commonly known as: CLARITIN Take 10 mg by mouth daily.   losartan 25 MG tablet Commonly known as: COZAAR Take 1 tablet (25 mg total) by mouth daily.   metFORMIN 1000 MG tablet Commonly known as: GLUCOPHAGE Take 1 tablet (1,000 mg total) by mouth 2 (two) times daily with a meal.   NovoLIN 70/30 (70-30) 100 UNIT/ML injection Generic drug: insulin NPH-regular Human Inject 60 Units into the skin 2 (two) times daily.   oyster calcium 500 MG Tabs tablet Take 500 mg of elemental calcium by mouth 2 (two) times a week.   Ozempic (0.25 or 0.5 MG/DOSE) 2 MG/3ML Sopn Generic drug: Semaglutide(0.25 or 0.5MG /DOS) Inject 0.5 mg into the skin once a week.   pantoprazole 40 MG tablet Commonly known as: PROTONIX Take 1 tablet by mouth  daily. Reported on 03/24/2016   rosuvastatin 40 MG tablet Commonly known as: CRESTOR Take 1 tablet (40 mg total) by mouth daily. What changed:  medication strength See the new instructions.   spironolactone 25 MG tablet Commonly known as: ALDACTONE Take 1 tablet (25 mg total) by mouth daily.   traMADol 50 MG tablet Commonly known as: ULTRAM Take 1 tablet (50 mg total) by mouth every 6 (six) hours as needed for severe pain (pain score 7-10).   vitamin A 84696 UNIT capsule Take 25,000 Units by mouth daily.         Follow-up Information      IMAGING. Go on 10/03/2023.   Why: Please arrive by 12:45 pm in order to have PA/LAT CXR taken PRIOR to office appointment with Dr. Joneen Roach information: [address] [place] [address]        [name], MD. Go on 10/03/2023.   Specialty: Cardiothoracic Surgery Why: Appointment time is at 1:45 pm Contact information: [address] [place] [address] [phone]         [name], NP. Call.   Specialty: Nurse Practitioner Why: for follow up regarding further diabetes management and surveillance of HGA1C 7.8 Contact information: [address] [place] [address] [phone]         [facility] Follow up.   Specialty: Home Health Services Why: your home health agencyy Contact information: [address] [place] [address] [place] [address] [phone]         [facility]. Go on 10/20/2023.   Specialty: Cardiology Why: Appointment time is at 10:15 am Contact information: [address] [place] [address] [phone]        [facility] Follow up on 10/02/2023.   Specialty: Cardiology Why: Advanced Heart Failure Clinic 8:30 AM Contact information: [address] [place] [address] [phone]         [facility] Follow up.   Why: will bring oxygen concentrator to home Contact information: [address] [place] [address] [phone]                 Signed:  Ardelle Balls, PA-C 09/22/2023, 7:36 AM

## 2023-09-06 NOTE — Anesthesia Procedure Notes (Signed)
Central Venous Catheter Insertion Performed by: Atilano Median, DO, anesthesiologist Start/End11/20/2024 8:11 AM, 09/06/2023 8:13 AM Patient location: Pre-op. Preanesthetic checklist: patient identified, IV checked, site marked, risks and benefits discussed, surgical consent, monitors and equipment checked, pre-op evaluation, timeout performed and anesthesia consent Hand hygiene performed  and maximum sterile barriers used  PA cath was placed.Swan type:thermodilution PA Cath depth:55 Procedure performed without using ultrasound guided technique. Attempts: 1 Patient tolerated the procedure well with no immediate complications.

## 2023-09-06 NOTE — Anesthesia Procedure Notes (Addendum)
Procedure Name: Intubation Date/Time: 09/06/2023 9:33 AM  Performed by: Alease Medina, CRNAPre-anesthesia Checklist: Patient identified, Emergency Drugs available, Suction available and Patient being monitored Patient Re-evaluated:Patient Re-evaluated prior to induction Oxygen Delivery Method: Circle system utilized Preoxygenation: Pre-oxygenation with 100% oxygen Induction Type: IV induction Ventilation: Mask ventilation without difficulty Laryngoscope Size: Mac and 3 Tube type: Oral Tube size: 8.0 mm Number of attempts: 1 Airway Equipment and Method: Stylet and Oral airway Placement Confirmation: ETT inserted through vocal cords under direct vision, positive ETCO2 and breath sounds checked- equal and bilateral Secured at: 22 cm Tube secured with: Tape Dental Injury: Teeth and Oropharynx as per pre-operative assessment  Comments: Intubation by SRNA C. Holmes under supervision by MD [employer]

## 2023-09-06 NOTE — Transfer of Care (Signed)
Immediate Anesthesia Transfer of Care Note  Patient: DEETTE DULWORTH  Procedure(s) Performed: CORONARY ARTERY BYPASS GRAFTING (CABG) TIMES FOUR USING LEFT INTERNAL MAMMARY ARTERY AND RIGHT GREATER SAPHENOUS VEIN, HARVESTED ENDOSCOPICALLY (Chest) MITRAL VALVE REPAIR USING CARPENTIER-MCCARTHY-ADAMS ANNUOPLASTY RING (Chest) TRANSESOPHAGEAL ECHOCARDIOGRAM  Patient Location: ICU  Anesthesia Type:General  Level of Consciousness: Patient remains intubated per anesthesia plan  Airway & Oxygen Therapy: Patient remains intubated per anesthesia plan  Post-op Assessment: Report given to RN and Post -op Vital signs reviewed and stable  Post vital signs: Reviewed and stable  Last Vitals:  Vitals Value Taken Time  BP    Temp 36.2 C 09/06/23 1643  Pulse 90 09/06/23 1643  Resp 16 09/06/23 1643  SpO2 93 % 09/06/23 1643  Vitals shown include unfiled device data.  Last Pain:  Vitals:   09/06/23 0715  TempSrc: Oral  PainSc: 0-No pain      Patients Stated Pain Goal: 0 (09/04/23 0400)  Complications: No notable events documented.

## 2023-09-06 NOTE — Progress Notes (Signed)
   09/06/23 2126  Adult Ventilator Settings  Vent Mode SIMV;PRVC;PSV  Vt Set 430 mL  Set Rate 4 bmp  FiO2 (%) 40 %  I Time 0.8 Sec(s)  PEEP 5 cmH20   Pt. Was placed on weaning mode per md order

## 2023-09-06 NOTE — Hospital Course (Addendum)
HPI:  Diana [name] is a 77 year old female with a past medical history of CAD (hx of PTCA stent and tandem stent to RCA 2011), HTN, HLD, and T2DM. She recently presented to her cardiologist due to chest discomfort with exertion. Over the past year she has experienced worsening chest discomfort and diaphoresis with exertion that is relieved with sublingual nitroglycerin. She also admits to worsening shortness of breath over the past few weeks with exertion and left tooth pain that accompanies the chest discomfort. She denies LOC. July 26, 2023 she was recommended to undergo cardiac catheterization but she deferred this due to her husbands knee surgery. She instead underwent a nuclear medicine PET/CT cardiac perfusion study that was markedly abnormal and noted an EF of 20%, findings were consistent with infarction and peri-infarction ischemia in the RCA distribution. Cardiac catheterization on 11/11 showed severe multivessel CAD with total occlusion of the RCA, severe stenosis of the left main, subtotal occlusion of the ostial circumflex and severe stenosis of the mid LAD and diagonal 1 as well as severe pulmonary hypertension and severe mitral regurgitation. Echocardiogram on 11/07 showed LVEF 25-30% with suspected ischemic severe mitral regurgitation and posterior leaflet teathering, no other valvular abnormalities were noted. Moderate elevation in pulmonary artery systolic pressures were again noted.  Coronary bypass grafting and mitral valve repair/replacement is indicated for survival benefit and relief of symptoms. Dr. Dorris Fetch discussed possible risks, benefits, and complications of the surgery with the patient and she agreed to proceed with surgery. Pre operative carotid duplex US showed no significant internal carotid artery stenosis bilaterally.  Hospital Course: Patient underwent a CABG x 4 and mitral valve repair (Annuloplasty ring, size 30 mm). She was transported from the OR to [facility] ICU in stable  condition. Patient was extubated early post op day one. She was weaned off Amiodarone, Nor epinephrine, and Milrinone drips. She was initially A paced. She had a small pneumothorax and air leak so chest tube remained for a few days and then were removed. She was put on Lovenox for DVT prophylaxis. She was put on a Lasix drip to help with edema. She was transitioned off the Insulin drip. Her pre op HGA1C was 7.8. Metformin will be restarted later in her hospital stay. She will need close medical follow up after discharge.She was transitioned to oral Amiodarone as converted to sinus rhythm. She was restarted on Coreg. Unfortunately, she went back into a fib and put back on the Amiodarone drip.  She was on Lovenox for DVT prophylaxis. Creatinine did increase to 1.91 on 11/24. On CXR 11/27 she was found to have  layering left pleural effusion. She was transitioned to oral Amiodarone on 11/26.  Left thoracentesis done 11/27 yielded 600cc of serosanguinous fluid, aeration improved following. He had AKI post op. Creatinine went as high as 1.81. In time, creatinine continued to improve. She was started on Apixaban on 12/02. Milrinone and Lasix gtt was discontinued on 11/29. She developed lower extremity pitting edema and cool extremities. PICC line was placed, coox was 54.6%. She was diuresed and Spironolactone was started. She was felt stable for transfer to the progressive unit on 12/01. GDMT was started and titrated as able. Insulin was adjusted due to hypoglycemia. As of 12/04, per advanced heart failure, she will be on Spironolactone 25 mg daily and Losartan 25 mg daily. She has been tolerating a diet and has had multiple bowel movements. All wounds are clean, dry, healing without signs of infection. She has been ambulating on room  air with good oxygenation. Advanced heart failure is finished titrating medications and per note by AHF, patient is felt stable for discharge. She has been mostly on room air but per recent  documentation, requires 2 L of oxygen (oxygen saturation documented as 87% during ambulation) to maintain oxygenation of > or = to 90% with ambulation;home oxygen ordered. Daughter will weigh daily and watch for increased LE edema or increased shortness of breath.  Her mobility is slowly improving and as discussed with Dr. Dorris Fetch, she is felt stable for discharge today with home health and home PT and OT.

## 2023-09-07 ENCOUNTER — Encounter (HOSPITAL_COMMUNITY): Payer: Self-pay | Admitting: Thoracic Surgery (Cardiothoracic Vascular Surgery)

## 2023-09-07 ENCOUNTER — Inpatient Hospital Stay (HOSPITAL_COMMUNITY): Payer: Medicare HMO

## 2023-09-07 DIAGNOSIS — I5023 Acute on chronic systolic (congestive) heart failure: Secondary | ICD-10-CM | POA: Diagnosis not present

## 2023-09-07 LAB — COOXEMETRY PANEL
Carboxyhemoglobin: 2.1 % — ABNORMAL HIGH (ref 0.5–1.5)
Carboxyhemoglobin: 1.8 % — ABNORMAL HIGH (ref 0.5–1.5)
Methemoglobin: <0.7 % (ref 0.0–1.5)
Methemoglobin: <0.7 % (ref 0.0–1.5)
O2 Saturation: 66.4 %
O2 Saturation: 61.4 %
Total hemoglobin: 8.4 g/dL — ABNORMAL LOW (ref 12.0–16.0)
Total hemoglobin: 8 g/dL — ABNORMAL LOW (ref 12.0–16.0)

## 2023-09-07 LAB — CBC
HCT: 24.2 % — ABNORMAL LOW (ref 36.0–46.0)
HCT: 25 % — ABNORMAL LOW (ref 36.0–46.0)
HCT: 24.8 % — ABNORMAL LOW (ref 36.0–46.0)
Hemoglobin: 7.8 g/dL — ABNORMAL LOW (ref 12.0–15.0)
Hemoglobin: 8.1 g/dL — ABNORMAL LOW (ref 12.0–15.0)
Hemoglobin: 8.1 g/dL — ABNORMAL LOW (ref 12.0–15.0)
MCH: 26.3 pg (ref 26.0–34.0)
MCH: 25.9 pg — ABNORMAL LOW (ref 26.0–34.0)
MCH: 26.4 pg (ref 26.0–34.0)
MCHC: 32.2 g/dL (ref 30.0–36.0)
MCHC: 32.4 g/dL (ref 30.0–36.0)
MCHC: 32.7 g/dL (ref 30.0–36.0)
MCV: 81.5 fL (ref 80.0–100.0)
MCV: 79.9 fL — ABNORMAL LOW (ref 80.0–100.0)
MCV: 80.8 fL (ref 80.0–100.0)
Platelets: 142 10*3/uL — ABNORMAL LOW (ref 150–400)
Platelets: 155 10*3/uL (ref 150–400)
Platelets: 153 10*3/uL (ref 150–400)
RBC: 2.97 MIL/uL — ABNORMAL LOW (ref 3.87–5.11)
RBC: 3.13 MIL/uL — ABNORMAL LOW (ref 3.87–5.11)
RBC: 3.07 MIL/uL — ABNORMAL LOW (ref 3.87–5.11)
RDW: 15.8 % — ABNORMAL HIGH (ref 11.5–15.5)
RDW: 15.9 % — ABNORMAL HIGH (ref 11.5–15.5)
RDW: 16 % — ABNORMAL HIGH (ref 11.5–15.5)
WBC: 11.5 10*3/uL — ABNORMAL HIGH (ref 4.0–10.5)
WBC: 17 10*3/uL — ABNORMAL HIGH (ref 4.0–10.5)
WBC: 17.6 10*3/uL — ABNORMAL HIGH (ref 4.0–10.5)
nRBC: 0 % (ref 0.0–0.2)
nRBC: 0 % (ref 0.0–0.2)
nRBC: 0 % (ref 0.0–0.2)

## 2023-09-07 LAB — POCT I-STAT 7, (LYTES, BLD GAS, ICA,H+H)
Acid-base deficit: 3 mmol/L — ABNORMAL HIGH (ref 0.0–2.0)
Acid-base deficit: 4 mmol/L — ABNORMAL HIGH (ref 0.0–2.0)
Bicarbonate: 22.3 mmol/L (ref 20.0–28.0)
Bicarbonate: 23 mmol/L (ref 20.0–28.0)
Calcium, Ion: 1.04 mmol/L — ABNORMAL LOW (ref 1.15–1.40)
Calcium, Ion: 1.07 mmol/L — ABNORMAL LOW (ref 1.15–1.40)
HCT: 22 % — ABNORMAL LOW (ref 36.0–46.0)
HCT: 24 % — ABNORMAL LOW (ref 36.0–46.0)
Hemoglobin: 7.5 g/dL — ABNORMAL LOW (ref 12.0–15.0)
Hemoglobin: 8.2 g/dL — ABNORMAL LOW (ref 12.0–15.0)
O2 Saturation: 94 %
O2 Saturation: 97 %
Patient temperature: 37
Patient temperature: 37
Potassium: 3.4 mmol/L — ABNORMAL LOW (ref 3.5–5.1)
Potassium: 3.6 mmol/L (ref 3.5–5.1)
Sodium: 144 mmol/L (ref 135–145)
Sodium: 144 mmol/L (ref 135–145)
TCO2: 24 mmol/L (ref 22–32)
TCO2: 24 mmol/L (ref 22–32)
pCO2 arterial: 43 mm[Hg] (ref 32–48)
pCO2 arterial: 43.5 mm[Hg] (ref 32–48)
pH, Arterial: 7.317 — ABNORMAL LOW (ref 7.35–7.45)
pH, Arterial: 7.337 — ABNORMAL LOW (ref 7.35–7.45)
pO2, Arterial: 79 mm[Hg] — ABNORMAL LOW (ref 83–108)
pO2, Arterial: 96 mm[Hg] (ref 83–108)

## 2023-09-07 LAB — BASIC METABOLIC PANEL
Anion gap: 8 (ref 5–15)
Anion gap: 9 (ref 5–15)
BUN: 14 mg/dL (ref 8–23)
BUN: 16 mg/dL (ref 8–23)
CO2: 24 mmol/L (ref 22–32)
CO2: 24 mmol/L (ref 22–32)
Calcium: 7.2 mg/dL — ABNORMAL LOW (ref 8.9–10.3)
Calcium: 7.4 mg/dL — ABNORMAL LOW (ref 8.9–10.3)
Chloride: 108 mmol/L (ref 98–111)
Chloride: 103 mmol/L (ref 98–111)
Creatinine, Ser: 1.06 mg/dL — ABNORMAL HIGH (ref 0.44–1.00)
Creatinine, Ser: 1.32 mg/dL — ABNORMAL HIGH (ref 0.44–1.00)
GFR, Estimated: 55 mL/min — ABNORMAL LOW (ref >60)
GFR, Estimated: 42 mL/min — ABNORMAL LOW (ref >60)
Glucose, Bld: 150 mg/dL — ABNORMAL HIGH (ref 70–99)
Glucose, Bld: 91 mg/dL (ref 70–99)
Potassium: 3.8 mmol/L (ref 3.5–5.1)
Potassium: 4.7 mmol/L (ref 3.5–5.1)
Sodium: 140 mmol/L (ref 135–145)
Sodium: 136 mmol/L (ref 135–145)

## 2023-09-07 LAB — ECHO INTRAOPERATIVE TEE
AR max vel: 1.6 cm2
AV Area VTI: 1.61 cm2
AV Area mean vel: 1.48 cm2
AV Mean grad: 4 mm[Hg]
AV Peak grad: 7 mm[Hg]
AV Vena cont: 0.3 cm
Ao pk vel: 1.32 m/s
Area-P 1/2: 2.22 cm2
Height: 64 in
MV M vel: 4.76 m/s
MV Peak grad: 90.6 mm[Hg]
MV VTI: 1.96 cm2
MV Vena cont: 0.33 cm
Radius: 0.6 cm
S' Lateral: 4.3 cm
Weight: 3259.28 [oz_av]

## 2023-09-07 LAB — GLUCOSE, CAPILLARY
Glucose-Capillary: 203 mg/dL — ABNORMAL HIGH (ref 70–99)
Glucose-Capillary: 201 mg/dL — ABNORMAL HIGH (ref 70–99)
Glucose-Capillary: 208 mg/dL — ABNORMAL HIGH (ref 70–99)
Glucose-Capillary: 191 mg/dL — ABNORMAL HIGH (ref 70–99)
Glucose-Capillary: 162 mg/dL — ABNORMAL HIGH (ref 70–99)
Glucose-Capillary: 127 mg/dL — ABNORMAL HIGH (ref 70–99)
Glucose-Capillary: 134 mg/dL — ABNORMAL HIGH (ref 70–99)
Glucose-Capillary: 131 mg/dL — ABNORMAL HIGH (ref 70–99)
Glucose-Capillary: 119 mg/dL — ABNORMAL HIGH (ref 70–99)
Glucose-Capillary: 127 mg/dL — ABNORMAL HIGH (ref 70–99)
Glucose-Capillary: 94 mg/dL (ref 70–99)
Glucose-Capillary: 103 mg/dL — ABNORMAL HIGH (ref 70–99)
Glucose-Capillary: 102 mg/dL — ABNORMAL HIGH (ref 70–99)
Glucose-Capillary: 106 mg/dL — ABNORMAL HIGH (ref 70–99)
Glucose-Capillary: 135 mg/dL — ABNORMAL HIGH (ref 70–99)
Glucose-Capillary: 146 mg/dL — ABNORMAL HIGH (ref 70–99)
Glucose-Capillary: 148 mg/dL — ABNORMAL HIGH (ref 70–99)
Glucose-Capillary: 141 mg/dL — ABNORMAL HIGH (ref 70–99)

## 2023-09-07 LAB — MAGNESIUM
Magnesium: 2.9 mg/dL — ABNORMAL HIGH (ref 1.7–2.4)
Magnesium: 2.6 mg/dL — ABNORMAL HIGH (ref 1.7–2.4)

## 2023-09-07 MED ORDER — FUROSEMIDE 10 MG/ML IJ SOLN
40.0000 mg | Freq: Once | INTRAMUSCULAR | Status: AC
  Administered 2023-09-07: 40 mg via INTRAVENOUS
  Filled 2023-09-07: qty 4

## 2023-09-07 MED ORDER — ENOXAPARIN SODIUM 40 MG/0.4ML IJ SOSY
40.0000 mg | PREFILLED_SYRINGE | Freq: Every day | INTRAMUSCULAR | Status: DC
  Administered 2023-09-07 – 2023-09-17 (×11): 40 mg via SUBCUTANEOUS
  Filled 2023-09-07 (×11): qty 0.4

## 2023-09-07 MED ORDER — POTASSIUM CHLORIDE 10 MEQ/50ML IV SOLN
10.0000 meq | INTRAVENOUS | Status: AC
Start: 2023-09-07 — End: 2023-09-07
  Administered 2023-09-07 (×4): 10 meq via INTRAVENOUS
  Filled 2023-09-07: qty 50

## 2023-09-07 NOTE — Progress Notes (Signed)
Pt VC 1.2L NIF -25

## 2023-09-07 NOTE — Progress Notes (Signed)
Pt was extubated to 2L N/C on IS pt was able to do 500

## 2023-09-07 NOTE — Progress Notes (Signed)
1 Day Post-Op Procedure(s) (LRB): CORONARY ARTERY BYPASS GRAFTING (CABG) TIMES FOUR USING LEFT INTERNAL MAMMARY ARTERY AND RIGHT GREATER SAPHENOUS VEIN, HARVESTED ENDOSCOPICALLY (N/A) MITRAL VALVE REPAIR USING CARPENTIER-MCCARTHY-ADAMS ANNUOPLASTY RING (N/A) TRANSESOPHAGEAL ECHOCARDIOGRAM (N/A) Subjective: Some incisional pain, denies nausea  Objective: Vital signs in last 24 hours: Temp:  [96.8 F (36 C)-99.3 F (37.4 C)] 98.8 F (37.1 C) (11/21 0800) Pulse Rate:  [78-93] 80 (11/21 0800) Cardiac Rhythm: Atrial paced;Bundle branch block (11/20 2100) Resp:  [15-28] 20 (11/21 0800) BP: (88-132)/(49-83) 126/74 (11/21 0800) SpO2:  [86 %-100 %] 93 % (11/21 0800) Arterial Line BP: (82-168)/(22-151) 136/42 (11/21 0800) FiO2 (%):  [40 %-50 %] 40 % (11/20 2247) Weight:  [102.8 kg] 102.8 kg (11/21 0615)  Hemodynamic parameters for last 24 hours: PAP: (48-80)/(11-34) 59/12 CO:  [3.7 L/min-6.5 L/min] 5.1 L/min CI:  [1.9 L/min/m2-3.3 L/min/m2] 2.57 L/min/m2  Intake/Output from previous day: 11/20 0701 - 11/21 0700 In: 6994.4 [P.O.:300; I.V.:3232.1; Blood:1091; IV Piggyback:2371.2] Out: 5683 [Urine:3910; Blood:993; Chest Tube:780] Intake/Output this shift: Total I/O In: 114 [I.V.:114] Out: 185 [Urine:55; Chest Tube:130]  General appearance: alert, cooperative, and no distress Neurologic: intact Heart: regular rate and rhythm Lungs: diminished breath sounds bibasilar + air leak from CT  Lab Results: Recent Labs    09/06/23 2218 09/06/23 2354 09/07/23 0108 09/07/23 0508  WBC 17.4*  --   --  11.5*  HGB 8.8*   < > 8.2* 7.8*  HCT 27.3*   < > 24.0* 24.2*  PLT 164  --   --  142*   < > = values in this interval not displayed.   BMET:  Recent Labs    09/06/23 2218 09/06/23 2354 09/07/23 0108 09/07/23 0508  NA 140   < > 144 140  K 3.8   < > 3.4* 3.8  CL 109  --   --  108  CO2 20*  --   --  24  GLUCOSE 187*  --   --  150*  BUN 16  --   --  14  CREATININE 1.34*  --   --   1.06*  CALCIUM 7.5*  --   --  7.2*   < > = values in this interval not displayed.    PT/INR:  Recent Labs    09/06/23 1637  LABPROT 17.9*  INR 1.5*   ABG    Component Value Date/Time   PHART 7.317 (L) 09/07/2023 0108   HCO3 22.3 09/07/2023 0108   TCO2 24 09/07/2023 0108   ACIDBASEDEF 4.0 (H) 09/07/2023 0108   O2SAT 94 09/07/2023 0108   CBG (last 3)  Recent Labs    09/07/23 0609 09/07/23 0701 09/07/23 0807  GLUCAP 127* 134* 131*    Assessment/Plan: S/P Procedure(s) (LRB): CORONARY ARTERY BYPASS GRAFTING (CABG) TIMES FOUR USING LEFT INTERNAL MAMMARY ARTERY AND RIGHT GREATER SAPHENOUS VEIN, HARVESTED ENDOSCOPICALLY (N/A) MITRAL VALVE REPAIR USING CARPENTIER-MCCARTHY-ADAMS ANNUOPLASTY RING (N/A) TRANSESOPHAGEAL ECHOCARDIOGRAM (N/A) POD # 1 NEURO- intact CV- in SR in 70s- Ap at 80 on amiodarone  QTc elevated- ? Dc amio- defer to Cardiology  Good hemodynamics on milrinone 0.3, norepi   Off epi and vasopressin  Will d/w AHF team- likely can decrease milrinone and follow co-ox RESP- IS  Has an air leak and a small right pneumo- keep Ct on water seal RENAL- creatinine normal  Diurese ENDO- CBG well controlled but requiring extremely high doses of insulin GI- advance diet as tolerated Anemia secondary to ABL- monitor SCD + enoxaparin for DVT  prophylaxis Mobilize     LOS: 10 days    [name] 09/07/2023

## 2023-09-07 NOTE — Op Note (Signed)
NAME: Diana [name], Diana [name] MEDICAL RECORD NO: [id number] ACCOUNT NO: [id number] DATE OF BIRTH: 11-09-1945 FACILITY: [facility] LOCATION: [facility] PHYSICIAN: Salvatore Decent. Dorris Fetch, MD  Operative Report   DATE OF PROCEDURE: 09/06/2023  PREOPERATIVE DIAGNOSES: 1.  Severe three-vessel coronary artery disease. 2.  Severe mitral regurgitation.  POSTOPERATIVE DIAGNOSES: 1.  Severe three-vessel coronary artery disease. 2.  Severe mitral regurgitation.  PROCEDURE: 1.  Median sternotomy, extracorporeal circulation, coronary artery bypass grafting x4 (left internal mammary artery to LAD, saphenous vein graft to first diagonal, saphenous vein graft to obtuse marginal, saphenous vein to acute marginal).   2.  Endoscopic vein harvest right leg.   3.  Mitral valve repair with Kem Boroughs annuloplasty ring 30 mm (model number [id number], serial number [id number]).  SURGEON:  Salvatore Decent. Dorris Fetch, MD  ASSISTANT:  Doree Fudge, PA  Experienced assistance was necessary for this case due to surgical complexity.  Doree Fudge independently harvested the saphenous vein and closed the leg incisions and then assisted with exposure, retraction of delicate tissues, suctioning, and suture management during the distal anastomosis and the mitral valve annuloplasty.  ANESTHESIA:  General.  FINDINGS:  Transesophageal echocardiography showed ejection fraction of approximately 30% with severe mitral regurgitation, improved EF post this bypass with moderate mitral regurgitation.  No paravalvular leak.  Saphenous vein fair quality.  Mammary artery good quality.  Acute marginal poor quality target.  Posterior descending ungraftable.  Remaining targets of good quality.  CLINICAL NOTE: Diana [name] is a 77 year old woman who has known coronary artery disease.  She presented with angina and congestive heart failure.  She was found to have severe 3-vessel coronary artery disease with severe pulmonary hypertension and severe  mitral regurgitation.  Echocardiogram showed ischemic cardiomyopathy with an EF of 25%.  She was initially managed medically and had significantly improved hemodynamics.  She was advised to undergo coronary bypass grafting and mitral valve repair  or replacement.  The indications, risks, benefits, and alternatives were discussed in detail with the patient.  She understood and accepted the risks and agreed to proceed.  OPERATIVE NOTE: Diana [name] was brought to the operating room on 09/06/2023.  She had induction of general anesthesia and was intubated.  Dr. Nance Pew of Anesthesiology performed transesophageal echocardiography.  Please see his separately dictated note for full details.  Intravenous antibiotics were administered.  A Foley catheter was placed.  The chest, abdomen, and legs were prepped and draped in the usual sterile fashion.    A time-out was performed.  An incision was made in the medial aspect of the right leg at the level of the knee.  The greater saphenous vein was identified.  It was harvested from the calf to the groin endoscopically by Doree Fudge.  The saphenous vein was relatively large caliber, fair quality, but was acceptable for use as a bypass graft.  Simultaneously with the vein harvest, a median sternotomy was performed and the left internal mammary artery was surfaced using standard technique.  The mammary artery was a good quality vessel with excellent flow when divided distally.  2000 units of heparin was administered during the vessel harvest and remainder of a full heparin dose was given prior to opening the pericardium.  The pericardium was opened.  The sternal retractor was gradually opened over time.  The pericardium was opened.  Carbon dioxide was insufflated into the operative field.  The ascending aorta was inspected.  There was no significant atherosclerotic disease.  There was massive cardiomegaly.  The aorta was cannulated via concentric  2-0 Ethibond  pledgeted pursestring sutures.  A 28-French right angle venous cannula was placed via a pursestring suture in the superior vena cava and cardiopulmonary bypass was initiated.  A 36-French malleable cannula was placed via a pursestring suture in the inferior aspect of the right atrium and directed into the inferior vena cava.  It was then connected to the bypass circuit and flows were maintained per protocol.  The patient was cooled at 32 degrees Celsius.  The coronary arteries were inspected and anastomotic sites were chosen.  The posterior descending was not a graftable vessel.  The conduits were inspected and cut to length.  A foam pad was placed in the pericardium to insulate the heart.  Temperature probe was placed in the myocardial septum.  A retrograde cardioplegia cannula was placed in the coronary sinus via a pursestring suture in the right atrium and an antegrade cardioplegic cannula was placed in the ascending aorta.  The aorta was cross-clamped.  The left ventricle was emptied via the aortic root vent.  Cardiac arrest was achieved with a combination of cold, antegrade and retrograde blood cardioplegia and topical iced saline.  An initial 500 mL of cardioplegia was administered antegrade.  There was a diastolic arrest and the remainder of the calculated dose was administered retrograde.  There was septal cooling to 11 degrees Celsius.  Additional cardioplegia doses were given at 60 minute intervals during the cross-clamp portion of the procedure.    A reversed saphenous vein graft was placed end to side to the acute marginal branch of the right coronary.  This was a 1 mm poor quality vessel.  The anastomosis was performed with a running 7-0 Prolene suture.  A probe did pass distally.  There was some resistance to flow as expected with a relatively large vein to a relatively small coronary artery.  Cardioplegia were administered down the graft.  There was good hemostasis.    Next, the heart was  elevated exposing the lateral wall.  There was significant scar in the inferior and lateral walls.  The obtuse marginal was a 1.5 mm good quality target.  The vein to it was of better quality, but was relatively large and still only fair overall.  An end-to-side anastomosis was performed with a running 7-0 Prolene suture.  A 1.5-mm probe passed easily proximally and distally at the completion of the anastomosis.  Cardioplegia was administered down the graft.  There was good flow and good hemostasis.    Next, a reversed saphenous vein graft was placed end-to-side to the first diagonal branch of the LAD.  This was a 1.5 mm good-quality target.  The end-to-side anastomosis was performed with a running 7-0 Prolene suture.  A probe passed easily proximally and distally and there was good flow and good hemostasis with cardioplegia administration.    The left internal mammary artery was brought through a window in the pericardium.  The distal end was beveled.  It was anastomosed end-to-side to the distal LAD.  The LAD was a 1.5 mm good quality target at the site of the anastomosis and the mammary was a good quality conduit.  The end-to-side anastomosis was performed with a running 8-0 Prolene suture.  At the completion of the anastomosis, the Bulldog clamp was removed to inspect for hemostasis.  Septal cooling was noted.  The Bulldog clamp was replaced and the mammary pedicle was tacked to the epicardial surface of the heart with 6-0 Prolene suture.    Cardioplegia was administered via the  retrograde cannula.  A left atriotomy was performed.  The mitral valve was inspected.  The posterior leaflet was tethered inferiorly.  There were no prolapsing segments of the leaflets.  The anterior leaflet sized for a 30 mm McCarthy Adams ring.  2-0 Ethibond horizontal mattress sutures were placed circumferentially around the annulus.  These were then passed through the Dacron cuff of the ring, which was lowered into place.  The  sutures were secured with a Cor-Knot device.  Testing of the mitral valve revealed patency with good pressure with instillation of ice saline into the left ventricle.    The left atriotomy was closed in two layers with a running 4-0 Prolene suture.  The first layer was a running horizontal mattress suture.  De-airing was performed at the completion of the first layer before tying the suture.  A second running simple suture was performed.  By this point, the patient was due for another dose of cardioplegia, which was administered via the retrograde cannula.  The cardioplegia cannula was removed from the ascending aorta.  The proximal vein graft anastomoses were then performed to 4.5 mm punch aortotomies with running 6-0 Prolene sutures.  At the completion of the final vein graft, the patient was placed in Trendelenburg position.  A warm dose of retrograde cardioplegia was administered.  Additional de-airing maneuvers were performed.  The bulldog clamp was removed from the left mammary artery and the aortic cross clamp was removed.  The total cross clamp time was 139 minutes.  The patient was initially in heart block, but did develop a bradycardic rhythm.    While rewarming was completed, the atriotomy and anastomoses were inspected for hemostasis.  The superior vena caval cannula was repositioned into the right atrium and the inferior vena cava cannula was removed.  The retrograde cardioplegic cannula was removed.  When the patient had rewarmed to a core temperature of 37 degrees Celsius, epicardial pacing wires were placed on the right ventricle and right atrium.  DDD pacing was initiated.  Milrinone, epinephrine, and norepinephrine infusions were initiated.  The patient then was weaned from bypass on the first attempt.  Post bypass TEE showed some residual mitral regurgitation.  It was not felt severe enough to warrant an attempt to redo the repair or replace the valve.  The patient was placed back on full  bypass and bleeding site at the inferior vena caval cannula was repaired with a 4-0 Prolene pledgeted suture.  Vasopressin infusion was initiated.  The patient then weaned from bypass without difficulty.  The total bypass time was 206 minutes.  The initial cardiac index was 1.8 liters per minute meter squared and the patient remained hemodynamically stable throughout the post bypass.  Post bypass transesophageal echocardiography showed some improvement in left ventricular wall motion with ejection fraction of approximately 40% in the setting of inotropic support.  There was moderate mitral regurgitation.  A test dose of protamine was administered and was well tolerated.  The superior vena cava cannula and aortic cannula were removed.  The chest was copiously irrigated with warm saline.  Hemostasis was achieved.  The left pleural and mediastinal chest tubes were placed through separate subcostal incisions.  The pericardium was not closed.  The sternum was closed with a combination of single and double-heavy gauge stainless steel wires.  The patient tolerated that well hemodynamically.  The pectoralis fascia and subcutaneous tissue and skin were closed in standard fashion.  All sponge, needle, and instrument counts were correct at the end  of the procedure.  The patient was transported from the operating room to the surgical intensive care unit, intubated and in critical condition.      SUJ D: 09/06/2023 5:57:27 pm T: 09/06/2023 10:07:00 pm  JOB: [id number]/ [id number]

## 2023-09-07 NOTE — Plan of Care (Signed)
  Problem: Education: Goal: Understanding of CV disease, CV risk reduction, and recovery process will improve Outcome: Progressing   Problem: Activity: Goal: Ability to return to baseline activity level will improve Outcome: Progressing   Problem: Cardiovascular: Goal: Ability to achieve and maintain adequate cardiovascular perfusion will improve Outcome: Progressing Goal: Vascular access site(s) Level 0-1 will be maintained Outcome: Progressing   

## 2023-09-07 NOTE — Progress Notes (Signed)
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  11/13: AFL w/ RVR. Started on IV amio + heparin gtt. Milrinone discontinued    11/14: TEE: EF 25%, RV severely reduced, severe LAE, 3-4+ MR 11/19: RHC- RA 2, PA 45/17 (29), PCWP 11 vwaves to 18, CO/CI (thermo) 4.62/2.4, PVR 2w, PAPi 14 11/20: s/p CABG x4  (LIMA to LAD, SVG to diagonal, SVG to OM, SVG to acute marginal) + MVR by Dr. Dorris Fetch  Post-Op Day #1  Extubated to nasal cannula overnight. Co-ox 66. Wean Milrinone to 0.125 mcg/kg/min. NE 11, wean as able.  Swan #s: PA 59/12  PCWP 15 CO 5.1 CI 2.57  Feeling okay this morning. Tired. Sitting up in bed. No SOB. Endorses cough.  Objective:    Weight Range: 102.8 kg Body mass index is 38.9 kg/m.   Vital Signs:   Temp:  [96.8 F (36 C)-99.3 F (37.4 C)] 98.8 F (37.1 C) (11/21 1000) Pulse Rate:  [78-93] 80 (11/21 1000) Resp:  [15-28] 19 (11/21 1000) BP: (88-132)/(49-83) 128/59 (11/21 1000) SpO2:  [86 %-100 %] 93 % (11/21 1000) Arterial Line BP: (82-168)/(22-151) 142/42 (11/21 1000) FiO2 (%):  [40 %-50 %] 40 % (11/20 2247) Weight:  [102.8 kg] 102.8 kg (11/21 0615) Last BM Date : 09/05/23  Weight change: Filed Weights   09/05/23 0620 09/06/23 0635 09/07/23 0615  Weight: 92.4 kg 92.4 kg 102.8 kg    Intake/Output:   Intake/Output Summary (Last 24 hours) at 09/07/2023 1126 Last data filed at 09/07/2023 1055 Gross per 24 hour  Intake 6266.42 ml  Output 5668 ml  Net 598.42 ml   Physical Exam    General: Well appearing. No distress on RA HEENT: neck supple.   Cardiac: JVP to jaw. S1 and S2 present. No murmurs or rub. Resp: Lung bases diminished Abdomen: Soft, non-tender, non-distended. + BS. Extremities: Warm and dry. No rash, cyanosis.  2+ diffuse edema.  Neuro: Alert and oriented x3. Affect pleasant.  Moves all extremities without difficulty. Lines/Devices:  [facility] PAC w Introducer, Foley, Radial A line, Epicardial Wires, Pleural and Mediastinal CTS  Telemetry   A-paced 80s. Epicardial Wires (personally reviewed)  Labs    CBC Recent Labs    09/06/23 2218 09/06/23 2354 09/07/23 0108 09/07/23 0508  WBC 17.4*  --   --  11.5*  HGB 8.8*   < > 8.2* 7.8*  HCT 27.3*   < > 24.0* 24.2*  MCV 81.3  --   --  81.5  PLT 164  --   --  142*   < > = values in this interval not displayed.   Basic Metabolic Panel Recent Labs    16/07/37 2218 09/06/23 2354 09/07/23 0108 09/07/23 0508  NA 140   < > 144 140  K 3.8   < > 3.4* 3.8  CL 109  --   --  108  CO2 20*  --   --  24  GLUCOSE 187*  --   --  150*  BUN 16  --   --  14  CREATININE 1.34*  --   --  1.06*  CALCIUM 7.5*  --   --  7.2*  MG 3.2*  --   --  2.9*   < > = values in this interval not displayed.   Liver Function Tests Recent Labs  09/06/23 0422  AST 24  ALT 35  ALKPHOS 41  BILITOT 0.4  PROT 7.1  ALBUMIN 3.3*    No results for input(s): "LIPASE", "AMYLASE" in the last 72 hours. Cardiac Enzymes No results for input(s): "CKTOTAL", "CKMB", "CKMBINDEX", "TROPONINI" in the last 72 hours.  BNP: BNP (last 3 results) No results for input(s): "BNP" in the last 8760 hours.  ProBNP (last 3 results) No results for input(s): "PROBNP" in the last 8760 hours.   D-Dimer No results for input(s): "DDIMER" in the last 72 hours. Hemoglobin A1C No results for input(s): "HGBA1C" in the last 72 hours.  Fasting Lipid Panel No results for input(s): "CHOL", "HDL", "LDLCALC", "TRIG", "CHOLHDL", "LDLDIRECT" in the last 72 hours. Thyroid Function Tests No results for input(s): "TSH", "T4TOTAL", "T3FREE", "THYROIDAB" in the last 72 hours.  Invalid input(s): "FREET3"  Imaging   DG Chest Port 1 View  Result Date: 09/06/2023 CLINICAL DATA:  Pneumothorax. EXAM: PORTABLE CHEST 1 VIEW COMPARISON:  September 06, 2023. FINDINGS:  Endotracheal and nasogastric tubes are in grossly good position. Left internal jugular Swan-Ganz catheter is noted with tip directed toward right pulmonary artery. Status post coronary bypass graft and cardiac valve repair. Left-sided chest tube is noted without pneumothorax. Bilateral lung opacities are noted concerning for edema or atelectasis. IMPRESSION: Support apparatus as noted above. Bilateral lung opacities concerning for edema or atelectasis. Electronically Signed   By: Lupita Raider M.D.   On: 09/06/2023 19:57   EP STUDY  Result Date: 09/06/2023 See surgical note for result.   Medications:    Scheduled Medications:  acetaminophen  1,000 mg Oral Q6H   Or   acetaminophen (TYLENOL) oral liquid 160 mg/5 mL  1,000 mg Per Tube Q6H   aspirin EC  325 mg Oral Daily   Or   aspirin  324 mg Per Tube Daily   bisacodyl  10 mg Oral Daily   Or   bisacodyl  10 mg Rectal Daily   Chlorhexidine Gluconate Cloth  6 each Topical Daily   docusate sodium  200 mg Oral Daily   enoxaparin (LOVENOX) injection  40 mg Subcutaneous QHS   ezetimibe  10 mg Oral Daily   gabapentin  100 mg Oral QHS   loratadine  10 mg Oral Daily   metoCLOPramide (REGLAN) injection  10 mg Intravenous Q6H   metoprolol tartrate  12.5 mg Oral BID   Or   metoprolol tartrate  12.5 mg Per Tube BID   [START ON 09/08/2023] pantoprazole  40 mg Oral Daily   pantoprazole (PROTONIX) IV  40 mg Intravenous QHS   rosuvastatin  40 mg Oral Daily   sodium chloride flush  3 mL Intravenous Q12H    Infusions:  sodium chloride     sodium chloride     sodium chloride 10 mL/hr at 09/07/23 1000   albumin human Stopped (09/06/23 2310)   amiodarone 30 mg/hr (09/07/23 1000)    ceFAZolin (ANCEF) IV 2 g (09/07/23 1011)   dexmedetomidine (PRECEDEX) IV infusion Stopped (09/06/23 2247)   epinephrine Stopped (09/07/23 0443)   insulin 8 Units/hr (09/07/23 1026)   lactated ringers     lactated ringers 20 mL/hr at 09/07/23 1000   milrinone 0.125  mcg/kg/min (09/07/23 1011)   nitroGLYCERIN     norepinephrine (LEVOPHED) Adult infusion 11 mcg/min (09/07/23 1000)   phenylephrine (NEO-SYNEPHRINE) Adult infusion     potassium chloride 10 mEq (09/07/23 1055)   vasopressin Stopped (09/07/23 0159)    PRN Medications: sodium chloride, albumin human,  dextrose, metoprolol tartrate, midazolam, morphine injection, ondansetron (ZOFRAN) IV, oxyCODONE, sodium chloride flush, traMADol  Patient Profile   77 y/o female w/ h/o CAD s/p remote LAD and RCA PCI in 2011, Type 2DM and HLD, recently diagnosed w/ new systolic heart failure and severe MR by echo. 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 AHF team consulted for HF optimization prior to CABG + MVR.  Assessment/Plan   1. Acute Systolic Heart Failure w/ Low Output  - new. Prior Echo 2014 w/ normal EF 50-55%, normal RV - Echo 11/24 EF 25-30%, severe MR, RV low normal  - Ischemic CM. LHC w/ severe MVCAD.  - RHC c/w decompensated heart failure, severe LV dysfunction, and severe mitral regurgitation (RA 21, PAP 83/37/56, PCWP 36, CO 4.11, CI 2.04) - 11/19 RHC: RA 2, PA 45/17 (29), PCWP 11 vwaves to 18, CO/CI (thermo) 4.62/2.4, PVR 2w, PAPi 14 - TEE: EF 25%, RV severely reduced, severe LAE, 3-4+ MR - S/p CABG X 4 (LIMA to LAD, SVG to diagonal, SVG to OM, SVG to acute marginal) + MVR on 11/20 - NE 11. Now off Epi and Vaso. Wean as able. - Wean milrinone to 0.125. Repeat Coox this afternoon. CI 2.57. Coox 66. - Grossly volume overloaded. Lasix 40 IV once, with great urine response. Re-dose this afternoon if needed.  2. Severe MR  - Now s/p MVR 11/20 at time of CABG on 11/20 - appears ischemic w/ posterior leaflet tethering on echo  - cath with giant V waves of greater than 60 mmHg + elevated BiV filling pressures - TEE EF 25% 3-4+ MR    3. Severe MVCAD  - Now s/p CABG X 4 (LIMA to LAD, SVG to diagonal, SVG to  OM, SVG to acute marginal) on 11/20 - Aspirin + statin  4. SVT/ AT  - converted to NSR on amio gtt  - Amio gtt at 30/hr while on inotrope support - off heparin d/t post-op - keep K > 4.0, Mg > 2.0    5. Type 2DM  - Hgb A1c 7.8  - Insulin gtt per TCTS  6. RIJ Hematoma: Resolved.  7. AKI: Resolved.   8. Acute blood loss anemia - Post procedure - CBC 7.8 this am. Recheck this afternoon.  - 1L CT output overnight  Length of Stay: 10  Diana Natalio Salois, NP  09/07/2023, 11:26 AM  Advanced Heart Failure Team Pager [phone] (M-F; 7a - 5p)  Please contact [facility] for night-coverage after hours (5p -7a ) and weekends on [email or web address]

## 2023-09-07 NOTE — TOC Progression Note (Signed)
Transition of Care [facility]) - Progression Note    Patient Details  Name: Diana [name] MRN: [id number] Date of Birth: 01/02/1946  Transition of Care [facility]) CM/SW Contact  Nicanor Bake Phone Number: [phone] 09/07/2023, 11:42 AM  Clinical Narrative: HF CSW met with pt at bedside. Pt stated that she was feeling ok. Pt stated that her phone was ringing but she was having a difficult time reaching it. Pt asked CSW to assist with phone. CSW contacted the pts daughter. Pt stated speaking with her made her feel better and she was looking forward to seeing her soon.   TOC will continue following.       Expected Discharge Plan: Home w Home Health Services Barriers to Discharge: Continued Medical Work up  Expected Discharge Plan and Services   Discharge Planning Services: CM Consult Post Acute Care Choice: Home Health Living arrangements for the past 2 months: Single Family Home                   DME Agency: [employer] Date DME Agency Contacted: 09/01/23 Time DME Agency Contacted: 1430 Representative spoke with at DME Agency: Mitch HH Arranged: RN, PT HH Agency: [facility] Date [facility] Agency Contacted: 09/01/23 Time HH Agency Contacted: 1432 Representative spoke with at [facility] Agency: Hassel Neth   Social Determinants of Health (SDOH) Interventions SDOH Screenings   Food Insecurity: No Food Insecurity (08/28/2023)  Housing: Low Risk  (08/28/2023)  Transportation Needs: No Transportation Needs (08/28/2023)  Utilities: Not At Risk (08/28/2023)  Tobacco Use: Low Risk  (09/06/2023)    Readmission Risk Interventions     No data to display

## 2023-09-08 ENCOUNTER — Other Ambulatory Visit: Payer: Self-pay | Admitting: Physician Assistant

## 2023-09-08 ENCOUNTER — Inpatient Hospital Stay (HOSPITAL_COMMUNITY): Payer: Medicare HMO

## 2023-09-08 DIAGNOSIS — I255 Ischemic cardiomyopathy: Secondary | ICD-10-CM

## 2023-09-08 DIAGNOSIS — Z9889 Other specified postprocedural states: Secondary | ICD-10-CM

## 2023-09-08 DIAGNOSIS — I5021 Acute systolic (congestive) heart failure: Secondary | ICD-10-CM | POA: Diagnosis not present

## 2023-09-08 DIAGNOSIS — N179 Acute kidney failure, unspecified: Secondary | ICD-10-CM | POA: Diagnosis not present

## 2023-09-08 LAB — COOXEMETRY PANEL
Carboxyhemoglobin: 1.6 % — ABNORMAL HIGH (ref 0.5–1.5)
Carboxyhemoglobin: 1.5 % (ref 0.5–1.5)
Methemoglobin: <0.7 % (ref 0.0–1.5)
Methemoglobin: <0.7 % (ref 0.0–1.5)
O2 Saturation: 99.1 %
O2 Saturation: 55.1 %
Total hemoglobin: 8.1 g/dL — ABNORMAL LOW (ref 12.0–16.0)
Total hemoglobin: 8.3 g/dL — ABNORMAL LOW (ref 12.0–16.0)

## 2023-09-08 LAB — BASIC METABOLIC PANEL
Anion gap: 10 (ref 5–15)
Anion gap: 11 (ref 5–15)
BUN: 20 mg/dL (ref 8–23)
BUN: 31 mg/dL — ABNORMAL HIGH (ref 8–23)
CO2: 23 mmol/L (ref 22–32)
CO2: 22 mmol/L (ref 22–32)
Calcium: 7.3 mg/dL — ABNORMAL LOW (ref 8.9–10.3)
Calcium: 7.4 mg/dL — ABNORMAL LOW (ref 8.9–10.3)
Chloride: 101 mmol/L (ref 98–111)
Chloride: 96 mmol/L — ABNORMAL LOW (ref 98–111)
Creatinine, Ser: 1.16 mg/dL — ABNORMAL HIGH (ref 0.44–1.00)
Creatinine, Ser: 1.67 mg/dL — ABNORMAL HIGH (ref 0.44–1.00)
GFR, Estimated: 49 mL/min — ABNORMAL LOW (ref >60)
GFR, Estimated: 32 mL/min — ABNORMAL LOW (ref >60)
Glucose, Bld: 144 mg/dL — ABNORMAL HIGH (ref 70–99)
Glucose, Bld: 365 mg/dL — ABNORMAL HIGH (ref 70–99)
Potassium: 4.2 mmol/L (ref 3.5–5.1)
Potassium: 4.8 mmol/L (ref 3.5–5.1)
Sodium: 134 mmol/L — ABNORMAL LOW (ref 135–145)
Sodium: 129 mmol/L — ABNORMAL LOW (ref 135–145)

## 2023-09-08 LAB — CBC
HCT: 23.3 % — ABNORMAL LOW (ref 36.0–46.0)
HCT: 23.7 % — ABNORMAL LOW (ref 36.0–46.0)
Hemoglobin: 7.5 g/dL — ABNORMAL LOW (ref 12.0–15.0)
Hemoglobin: 7.7 g/dL — ABNORMAL LOW (ref 12.0–15.0)
MCH: 26.2 pg (ref 26.0–34.0)
MCH: 26.9 pg (ref 26.0–34.0)
MCHC: 32.2 g/dL (ref 30.0–36.0)
MCHC: 32.5 g/dL (ref 30.0–36.0)
MCV: 81.5 fL (ref 80.0–100.0)
MCV: 82.9 fL (ref 80.0–100.0)
Platelets: 152 10*3/uL (ref 150–400)
Platelets: 174 10*3/uL (ref 150–400)
RBC: 2.86 MIL/uL — ABNORMAL LOW (ref 3.87–5.11)
RBC: 2.86 MIL/uL — ABNORMAL LOW (ref 3.87–5.11)
RDW: 16.3 % — ABNORMAL HIGH (ref 11.5–15.5)
RDW: 16.3 % — ABNORMAL HIGH (ref 11.5–15.5)
WBC: 17 10*3/uL — ABNORMAL HIGH (ref 4.0–10.5)
WBC: 19.2 10*3/uL — ABNORMAL HIGH (ref 4.0–10.5)
nRBC: 0 % (ref 0.0–0.2)
nRBC: 0.1 % (ref 0.0–0.2)

## 2023-09-08 LAB — GLUCOSE, CAPILLARY
Glucose-Capillary: 112 mg/dL — ABNORMAL HIGH (ref 70–99)
Glucose-Capillary: 132 mg/dL — ABNORMAL HIGH (ref 70–99)
Glucose-Capillary: 137 mg/dL — ABNORMAL HIGH (ref 70–99)
Glucose-Capillary: 144 mg/dL — ABNORMAL HIGH (ref 70–99)
Glucose-Capillary: 152 mg/dL — ABNORMAL HIGH (ref 70–99)
Glucose-Capillary: 154 mg/dL — ABNORMAL HIGH (ref 70–99)
Glucose-Capillary: 157 mg/dL — ABNORMAL HIGH (ref 70–99)
Glucose-Capillary: 162 mg/dL — ABNORMAL HIGH (ref 70–99)
Glucose-Capillary: 164 mg/dL — ABNORMAL HIGH (ref 70–99)
Glucose-Capillary: 174 mg/dL — ABNORMAL HIGH (ref 70–99)
Glucose-Capillary: 180 mg/dL — ABNORMAL HIGH (ref 70–99)
Glucose-Capillary: 183 mg/dL — ABNORMAL HIGH (ref 70–99)
Glucose-Capillary: 257 mg/dL — ABNORMAL HIGH (ref 70–99)
Glucose-Capillary: 284 mg/dL — ABNORMAL HIGH (ref 70–99)

## 2023-09-08 MED ORDER — MILRINONE LACTATE IN DEXTROSE 20-5 MG/100ML-% IV SOLN
0.1250 ug/kg/min | INTRAVENOUS | Status: DC
Start: 2023-09-08 — End: 2023-09-14
  Administered 2023-09-08 – 2023-09-12 (×7): 0.25 ug/kg/min via INTRAVENOUS
  Administered 2023-09-12 – 2023-09-13 (×2): 0.125 ug/kg/min via INTRAVENOUS
  Filled 2023-09-08 (×9): qty 100

## 2023-09-08 MED ORDER — FUROSEMIDE 10 MG/ML IJ SOLN
160.0000 mg | Freq: Once | INTRAVENOUS | Status: AC
  Administered 2023-09-08: 160 mg via INTRAVENOUS
  Filled 2023-09-08: qty 10

## 2023-09-08 MED ORDER — AMIODARONE HCL 200 MG PO TABS
400.0000 mg | ORAL_TABLET | Freq: Two times a day (BID) | ORAL | Status: DC
Start: 2023-09-08 — End: 2023-09-10
  Administered 2023-09-08 – 2023-09-10 (×5): 400 mg via ORAL
  Filled 2023-09-08 (×5): qty 2

## 2023-09-08 MED ORDER — INSULIN ASPART 100 UNIT/ML IJ SOLN
0.0000 [IU] | Freq: Three times a day (TID) | INTRAMUSCULAR | Status: DC
  Administered 2023-09-08: 4 [IU] via SUBCUTANEOUS
  Administered 2023-09-08 – 2023-09-09 (×2): 11 [IU] via SUBCUTANEOUS
  Administered 2023-09-09 (×2): 7 [IU] via SUBCUTANEOUS
  Administered 2023-09-10: 4 [IU] via SUBCUTANEOUS
  Administered 2023-09-10: 3 [IU] via SUBCUTANEOUS
  Administered 2023-09-11 (×2): 4 [IU] via SUBCUTANEOUS
  Administered 2023-09-12 (×2): 3 [IU] via SUBCUTANEOUS
  Administered 2023-09-13: 7 [IU] via SUBCUTANEOUS
  Administered 2023-09-13: 3 [IU] via SUBCUTANEOUS
  Administered 2023-09-14: 7 [IU] via SUBCUTANEOUS
  Administered 2023-09-14: 4 [IU] via SUBCUTANEOUS
  Administered 2023-09-15: 7 [IU] via SUBCUTANEOUS
  Administered 2023-09-15: 4 [IU] via SUBCUTANEOUS
  Administered 2023-09-15: 11 [IU] via SUBCUTANEOUS
  Administered 2023-09-16 (×2): 7 [IU] via SUBCUTANEOUS
  Administered 2023-09-17: 3 [IU] via SUBCUTANEOUS
  Administered 2023-09-17: 11 [IU] via SUBCUTANEOUS
  Administered 2023-09-18: 4 [IU] via SUBCUTANEOUS
  Administered 2023-09-18 – 2023-09-20 (×5): 7 [IU] via SUBCUTANEOUS
  Administered 2023-09-21: 3 [IU] via SUBCUTANEOUS

## 2023-09-08 MED ORDER — INSULIN GLARGINE-YFGN 100 UNIT/ML ~~LOC~~ SOLN
40.0000 [IU] | Freq: Two times a day (BID) | SUBCUTANEOUS | Status: DC
  Administered 2023-09-08 (×2): 40 [IU] via SUBCUTANEOUS
  Filled 2023-09-08 (×4): qty 0.4

## 2023-09-08 MED ORDER — METOLAZONE 2.5 MG PO TABS
2.5000 mg | ORAL_TABLET | Freq: Once | ORAL | Status: AC
  Administered 2023-09-08: 2.5 mg via ORAL
  Filled 2023-09-08: qty 1

## 2023-09-08 MED ORDER — INSULIN ASPART 100 UNIT/ML IJ SOLN
0.0000 [IU] | Freq: Every day | INTRAMUSCULAR | Status: DC
  Administered 2023-09-08: 3 [IU] via SUBCUTANEOUS
  Administered 2023-09-17: 2 [IU] via SUBCUTANEOUS

## 2023-09-08 MED ORDER — METFORMIN HCL 500 MG PO TABS: 1000.0000 mg | ORAL_TABLET | Freq: Two times a day (BID) | ORAL | Status: DC

## 2023-09-08 MED ORDER — FUROSEMIDE 10 MG/ML IJ SOLN
10.0000 mg/h | INTRAVENOUS | Status: DC
  Administered 2023-09-08 (×2): 20 mg/h via INTRAVENOUS
  Administered 2023-09-09 – 2023-09-15 (×7): 10 mg/h via INTRAVENOUS
  Filled 2023-09-08 (×11): qty 20

## 2023-09-08 MED ORDER — FUROSEMIDE 10 MG/ML IJ SOLN
40.0000 mg | Freq: Once | INTRAMUSCULAR | Status: AC
  Administered 2023-09-08: 40 mg via INTRAVENOUS
  Filled 2023-09-08: qty 4

## 2023-09-08 MED ORDER — INSULIN ASPART 100 UNIT/ML IJ SOLN
4.0000 [IU] | Freq: Three times a day (TID) | INTRAMUSCULAR | Status: DC
  Administered 2023-09-08 (×2): 4 [IU] via SUBCUTANEOUS

## 2023-09-08 MED ORDER — FUROSEMIDE 10 MG/ML IJ SOLN: 40.0000 mg | Freq: Once | INTRAMUSCULAR | Status: DC

## 2023-09-08 NOTE — Progress Notes (Signed)
error 

## 2023-09-08 NOTE — Plan of Care (Signed)
  Problem: Cardiovascular: Goal: Ability to achieve and maintain adequate cardiovascular perfusion will improve Outcome: Progressing   Problem: Education: Goal: Knowledge of General Education information will improve Description: Including pain rating scale, medication(s)/side effects and non-pharmacologic comfort measures Outcome: Progressing   Problem: Health Behavior/Discharge Planning: Goal: Ability to manage health-related needs will improve Outcome: Progressing   Problem: Clinical Measurements: Goal: Will remain free from infection Outcome: Progressing Goal: Diagnostic test results will improve Outcome: Progressing   Problem: Nutrition: Goal: Adequate nutrition will be maintained Outcome: Progressing   Problem: Coping: Goal: Level of anxiety will decrease Outcome: Progressing

## 2023-09-08 NOTE — Progress Notes (Addendum)
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  11/13: AFL w/ RVR. Started on IV amio + heparin gtt. Milrinone discontinued    11/14: TEE: EF 25%, RV severely reduced, severe LAE, 3-4+ MR 11/19: RHC- RA 2, PA 45/17 (29), PCWP 11 vwaves to 18, CO/CI (thermo) 4.62/2.4, PVR 2w, PAPi 14 11/20: s/p CABG x4  (LIMA to LAD, SVG to diagonal, SVG to OM, SVG to acute marginal) + MVR by Dr. Dorris Fetch  Post-Op Day #2  Off NE. On Milrinone 0.125. Co-ox 55 Grossly volume overloaded. 1.6L UOP with Lasix 40 IV BID. Hgb 7.5. CT OP 740, thin and serosang.   Up in chair, eating breakfast. Feeling alright this morning. Reports weak cough. Mild SOB and incisional pain.   Objective:    Weight Range: 104.2 kg Body mass index is 39.43 kg/m.   Vital Signs:   Temp:  [98.3 F (36.8 C)-99 F (37.2 C)] 98.3 F (36.8 C) (11/22 0523) Pulse Rate:  [77-80] 80 (11/22 0815) Resp:  [16-33] 25 (11/22 0816) BP: (96-128)/(46-59) 113/58 (11/22 0816) SpO2:  [85 %-94 %] 93 % (11/22 0815) Arterial Line BP: (104-149)/(40-52) 117/45 (11/22 0816) Weight:  [104.2 kg] 104.2 kg (11/22 0500) Last BM Date : 09/05/23  Weight change: Filed Weights   09/06/23 0635 09/07/23 0615 09/08/23 0500  Weight: 92.4 kg 102.8 kg 104.2 kg    Intake/Output:   Intake/Output Summary (Last 24 hours) at 09/08/2023 0827 Last data filed at 09/08/2023 0800 Gross per 24 hour  Intake 1854.56 ml  Output 2280 ml  Net -425.44 ml   Physical Exam    CVP: setting up now, pending General:  Sitting up in bed.  HEENT: neck supple.  RIJ bruising and scab Cardiac: JVP elevated, difficult to visualize given bruising. S1 and S2 present. No murmurs or rub. Resp: Lung sounds clear, diminished bases Abdomen: Soft, non-tender, non-distended. + BS. Extremities: Warm and dry. No rash,  cyanosis.  3+ edema to thighs, calves, and ankles Neuro: Alert and oriented x3. Affect pleasant. Moves all extremities without difficulty. Lines/Devices:  LIJ CVL , foley, CTs, Radial aline   Telemetry   A-paced 80s. Epicardial Wires (personally reviewed)  Labs    CBC Recent Labs    09/07/23 1626 09/08/23 0435  WBC 17.6* 17.0*  HGB 8.1* 7.5*  HCT 24.8* 23.3*  MCV 80.8 81.5  PLT 153 152   Basic Metabolic Panel Recent Labs    53/66/44 0508 09/07/23 1626 09/08/23 0435  NA 140 136 134*  K 3.8 4.7 4.2  CL 108 103 101  CO2 24 24 23   GLUCOSE 150* 91 144*  BUN 14 16 20   CREATININE 1.06* 1.32* 1.16*  CALCIUM 7.2* 7.4* 7.3*  MG 2.9* 2.6*  --    Liver Function Tests Recent Labs    09/06/23 0422  AST 24  ALT 35  ALKPHOS 41  BILITOT 0.4  PROT 7.1  ALBUMIN 3.3*    No results for input(s): "LIPASE", "AMYLASE" in the last 72 hours. Cardiac Enzymes No results for input(s): "CKTOTAL", "CKMB", "CKMBINDEX", "TROPONINI" in the last 72 hours.  BNP: BNP (last 3 results) No results for input(s): "BNP" in the last 8760 hours.  ProBNP (last 3 results) No results for input(s): "PROBNP" in the last 8760 hours.   D-Dimer No results for input(s): "DDIMER" in the last 72  hours. Hemoglobin A1C No results for input(s): "HGBA1C" in the last 72 hours.  Fasting Lipid Panel No results for input(s): "CHOL", "HDL", "LDLCALC", "TRIG", "CHOLHDL", "LDLDIRECT" in the last 72 hours. Thyroid Function Tests No results for input(s): "TSH", "T4TOTAL", "T3FREE", "THYROIDAB" in the last 72 hours.  Invalid input(s): "FREET3"  Imaging   No results found.  Medications:    Scheduled Medications:  acetaminophen  1,000 mg Oral Q6H   Or   acetaminophen (TYLENOL) oral liquid 160 mg/5 mL  1,000 mg Per Tube Q6H   amiodarone  400 mg Oral BID   aspirin EC  325 mg Oral Daily   Or   aspirin  324 mg Per Tube Daily   bisacodyl  10 mg Oral Daily   Or   bisacodyl  10 mg Rectal Daily    Chlorhexidine Gluconate Cloth  6 each Topical Daily   docusate sodium  200 mg Oral Daily   enoxaparin (LOVENOX) injection  40 mg Subcutaneous QHS   ezetimibe  10 mg Oral Daily   furosemide  40 mg Intravenous Once   gabapentin  100 mg Oral QHS   insulin aspart  0-20 Units Subcutaneous TID WC   insulin aspart  0-5 Units Subcutaneous QHS   insulin aspart  4 Units Subcutaneous TID WC   insulin glargine-yfgn  40 Units Subcutaneous BID   loratadine  10 mg Oral Daily   metoprolol tartrate  12.5 mg Oral BID   Or   metoprolol tartrate  12.5 mg Per Tube BID   pantoprazole  40 mg Oral Daily   rosuvastatin  40 mg Oral Daily   sodium chloride flush  3 mL Intravenous Q12H    Infusions:  albumin human Stopped (09/06/23 2310)   amiodarone 30 mg/hr (09/08/23 0800)   dexmedetomidine (PRECEDEX) IV infusion Stopped (09/06/23 2247)   epinephrine Stopped (09/07/23 0443)   milrinone 0.125 mcg/kg/min (09/08/23 0800)   nitroGLYCERIN     norepinephrine (LEVOPHED) Adult infusion Stopped (09/08/23 0731)   phenylephrine (NEO-SYNEPHRINE) Adult infusion     vasopressin Stopped (09/07/23 0159)    PRN Medications: albumin human, metoprolol tartrate, midazolam, morphine injection, ondansetron (ZOFRAN) IV, oxyCODONE, sodium chloride flush, traMADol  Patient Profile   77 y/o female w/ h/o CAD s/p remote LAD and RCA PCI in 2011, Type 2DM and HLD, recently diagnosed w/ new systolic heart failure and severe MR by echo. 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 AHF team consulted for HF optimization prior to CABG + MVR.  Assessment/Plan   1. Acute Systolic Heart Failure w/ Low Output  - new. Prior Echo 2014 w/ normal EF 50-55%, normal RV - Echo 11/24 EF 25-30%, severe MR, RV low normal  - Ischemic CM. LHC w/ severe MVCAD.  - RHC c/w decompensated heart failure, severe LV dysfunction, and severe mitral regurgitation (RA 21, PAP  83/37/56, PCWP 36, CO 4.11, CI 2.04) - 11/19 RHC: RA 2, PA 45/17 (29), PCWP 11 vwaves to 18, CO/CI (thermo) 4.62/2.4, PVR 2w, PAPi 14 - TEE: EF 25%, RV severely reduced, severe LAE, 3-4+ MR - S/p CABG X 4 (LIMA to LAD, SVG to diagonal, SVG to OM, SVG to acute marginal) + MVR on 11/20 - Off NE. Continue Milrinone to 0.125 until further diuresed. Coox 55. - Grossly volume overloaded. Lasix 40 IV BID. 1.6L UOP. Increase to Lasix 80 BID  2. Severe MR  - Now s/p MVR 11/20 at time of CABG on 11/20 -  appears ischemic w/ posterior leaflet tethering on echo  - cath with giant V waves of greater than 60 mmHg + elevated BiV filling pressures - TEE EF 25% 3-4+ MR    3. Severe MVCAD  - Now s/p CABG X 4 (LIMA to LAD, SVG to diagonal, SVG to OM, SVG to acute marginal) on 11/20 - Aspirin + statin  4. SVT/ AT  - converted to NSR on amio gtt  - Amio gtt at 30/hr while on inotrope support - off heparin d/t post-op - keep K > 4.0, Mg > 2.0    5. Type 2DM  - Hgb A1c 7.8  - Insulin gtt per TCTS  6. RIJ Hematoma: Resolved.  7. AKI:  - in the setting of volume overload and diuresis - 1.06>1.32>1.16 - Diurese as above  8. Acute blood loss anemia: post procedure - hgb 7.5. Repeat this afternoon - assumed some component of hemodilution  - 760L CT output, thin and serosanguinous drainage  Length of Stay: 11  [place] Lee, NP  09/08/2023, 8:27 AM  Advanced Heart Failure Team Pager [phone] (M-F; 7a - 5p)  Please contact [facility] for night-coverage after hours (5p -7a ) and weekends on [email or web address]  Agree with above.  On milrinone 0.125 and NE 2.  Co-ox 55% CVP 15. Feels weak. Bloated. Very fatigued. Poor urine output. Weight up 25 pounds.   General:  Sitting in chair. Weak fatigued No resp difficulty HEENT: normal Neck: supple. JVP to ear Carotids 2+ bilat; no bruits. No lymphadenopathy or thryomegaly appreciated. Cor: Regular rate & rhythm. No rubs, gallops or murmurs. Lungs: decreased   Abdomen: soft, nontender, + distended. No hepatosplenomegaly. Extremities: no cyanosis, clubbing, rash, 3+ edema Neuro: alert & orientedx3, cranial nerves grossly intact. moves all 4 extremities w/o difficulty. Affect pleasant   She is markedly volume overloaded. Co-ox marginal. Not diuresing. I am concerned about her RV.   Increase milrinone. Switch to lasix gtt. Follow co-ox. May need low-dose Epi.   CRITICAL CARE Performed by: Arvilla Meres  Total critical care time: 40 minutes  Critical care time was exclusive of separately billable procedures and treating other patients.  Critical care was necessary to treat or prevent imminent or life-threatening deterioration.  Critical care was time spent personally by me (independent of midlevel providers or residents) on the following activities: development of treatment plan with patient and/or surrogate as well as nursing, discussions with consultants, evaluation of patient's response to treatment, examination of patient, obtaining history from patient or surrogate, ordering and performing treatments and interventions, ordering and review of laboratory studies, ordering and review of radiographic studies, pulse oximetry and re-evaluation of patient's condition.  Arvilla Meres, MD  3:09 PM

## 2023-09-08 NOTE — Progress Notes (Signed)
      [address]       [place] [address]             [phone]       POD # 2  Resting currently, did ambulate a short distance earlier  BP (!) 121/50   Pulse 80   Temp 98 F (36.7 C) (Oral)   Resp (!) 24   Ht 5\' 4"  (1.626 m)   Wt 104.2 kg   SpO2 97%   BMI 39.43 kg/m  Co-ox 55   Intake/Output Summary (Last 24 hours) at 09/08/2023 1738 Last data filed at 09/08/2023 1500 Gross per 24 hour  Intake 868.73 ml  Output 1640 ml  Net -771.27 ml   Started on Lasix drip  Viviann Spare C. Dorris Fetch, MD [facility] [phone]

## 2023-09-08 NOTE — Progress Notes (Signed)
2 Days Post-Op Procedure(s) (LRB): CORONARY ARTERY BYPASS GRAFTING (CABG) TIMES FOUR USING LEFT INTERNAL MAMMARY ARTERY AND RIGHT GREATER SAPHENOUS VEIN, HARVESTED ENDOSCOPICALLY (N/A) MITRAL VALVE REPAIR USING CARPENTIER-MCCARTHY-ADAMS ANNUOPLASTY RING (N/A) TRANSESOPHAGEAL ECHOCARDIOGRAM (N/A) Subjective: Some incisional pain, appetite poor  Objective: Vital signs in last 24 hours: Temp:  [98.3 F (36.8 C)-99 F (37.2 C)] 98.3 F (36.8 C) (11/22 0523) Pulse Rate:  [79-80] 80 (11/21 1700) Cardiac Rhythm: A-V Sequential paced (11/21 2100) Resp:  [16-24] 24 (11/21 1700) BP: (108-128)/(52-74) 108/58 (11/21 1700) SpO2:  [92 %-94 %] 92 % (11/21 1700) Arterial Line BP: (125-149)/(41-44) 125/41 (11/21 1700) Weight:  [104.2 kg] 104.2 kg (11/22 0500)  Hemodynamic parameters for last 24 hours: PAP: (59-61)/(11-13) 60/13 CO:  [5.1 L/min] 5.1 L/min CI:  [2.57 L/min/m2] 2.57 L/min/m2  Intake/Output from previous day: 11/21 0701 - 11/22 0700 In: 1791.3 [I.V.:1316.5; IV Piggyback:474.9] Out: 2380 [Urine:1640; Chest Tube:740] Intake/Output this shift: No intake/output data recorded.  General appearance: alert, cooperative, and no distress Neurologic: intact Heart: regular rate and rhythm and friction rub heard - Lungs: diminished breath sounds bibasilar Abdomen: normal findings: soft, non-tender  Lab Results: Recent Labs    09/07/23 1626 09/08/23 0435  WBC 17.6* 17.0*  HGB 8.1* 7.5*  HCT 24.8* 23.3*  PLT 153 152   BMET:  Recent Labs    09/07/23 1626 09/08/23 0435  NA 136 134*  K 4.7 4.2  CL 103 101  CO2 24 23  GLUCOSE 91 144*  BUN 16 20  CREATININE 1.32* 1.16*  CALCIUM 7.4* 7.3*    PT/INR:  Recent Labs    09/06/23 1637  LABPROT 17.9*  INR 1.5*   ABG    Component Value Date/Time   PHART 7.317 (L) 09/07/2023 0108   HCO3 22.3 09/07/2023 0108   TCO2 24 09/07/2023 0108   ACIDBASEDEF 4.0 (H) 09/07/2023 0108   O2SAT 99.1 09/08/2023 0435   CBG (last 3)   Recent Labs    09/08/23 0339 09/08/23 0438 09/08/23 0621  GLUCAP 152* 144* 112*    Assessment/Plan: S/P Procedure(s) (LRB): CORONARY ARTERY BYPASS GRAFTING (CABG) TIMES FOUR USING LEFT INTERNAL MAMMARY ARTERY AND RIGHT GREATER SAPHENOUS VEIN, HARVESTED ENDOSCOPICALLY (N/A) MITRAL VALVE REPAIR USING CARPENTIER-MCCARTHY-ADAMS ANNUOPLASTY RING (N/A) TRANSESOPHAGEAL ECHOCARDIOGRAM (N/A) POD # 2 NEURO- intact CV- in Sr in low 60s- AP at 80  Co-ox sent from A line- will repeat from venous line  Milrinone and norepi- will d/w AHF team  On amiodarone-  RESP_ continue IS, Still with right pneumothorax  Unable to assess air leak due to poor cough- leave CT in place RENAL- creatinine down slightly to 1.16  Continue to diurese ENDO- CBG well controlled on more reasonable insulin doses  Transition to Semglee + SSI GI- Po as tolerated Anemia sec to ABL- monitor SCD + enoxaparin for DVT prophylaxis Ambulate   LOS: 11 days    [name] 09/08/2023

## 2023-09-08 NOTE — Anesthesia Postprocedure Evaluation (Signed)
Anesthesia Post Note  Patient: ZYRIELLE HAZARD  Procedure(s) Performed: CORONARY ARTERY BYPASS GRAFTING (CABG) TIMES FOUR USING LEFT INTERNAL MAMMARY ARTERY AND RIGHT GREATER SAPHENOUS VEIN, HARVESTED ENDOSCOPICALLY (Chest) MITRAL VALVE REPAIR USING CARPENTIER-MCCARTHY-ADAMS ANNUOPLASTY RING (Chest) TRANSESOPHAGEAL ECHOCARDIOGRAM     Patient location during evaluation: SICU Anesthesia Type: General Level of consciousness: sedated Pain management: pain level controlled Vital Signs Assessment: post-procedure vital signs reviewed and stable Respiratory status: patient remains intubated per anesthesia plan Cardiovascular status: stable Postop Assessment: no apparent nausea or vomiting Anesthetic complications: no   No notable events documented.  Last Vitals:  Vitals:   09/08/23 0024 09/08/23 0523  BP:    Pulse:    Resp:    Temp: 36.9 C 36.8 C  SpO2:      Last Pain:  Vitals:   09/08/23 0523  TempSrc: Oral  PainSc:                  Nelle Don Ahlivia Salahuddin

## 2023-09-08 NOTE — Progress Notes (Signed)
Echo post MV repair

## 2023-09-09 ENCOUNTER — Inpatient Hospital Stay (HOSPITAL_COMMUNITY): Payer: Medicare HMO

## 2023-09-09 DIAGNOSIS — N179 Acute kidney failure, unspecified: Secondary | ICD-10-CM | POA: Diagnosis not present

## 2023-09-09 DIAGNOSIS — I5021 Acute systolic (congestive) heart failure: Secondary | ICD-10-CM | POA: Diagnosis not present

## 2023-09-09 LAB — BPAM RBC
Blood Product Expiration Date: 202411262359
Blood Product Expiration Date: 202411262359
Blood Product Expiration Date: 202412142359
Blood Product Expiration Date: 202412142359
Blood Product Expiration Date: 202412142359
Blood Product Expiration Date: 202412142359
ISSUE DATE / TIME: 202411201243
ISSUE DATE / TIME: 202411201243
ISSUE DATE / TIME: 202411201305
ISSUE DATE / TIME: 202411201305
Unit Type and Rh: 600
Unit Type and Rh: 600
Unit Type and Rh: 6200
Unit Type and Rh: 6200
Unit Type and Rh: 6200
Unit Type and Rh: 6200

## 2023-09-09 LAB — TYPE AND SCREEN
ABO/RH(D): A POS
Antibody Screen: NEGATIVE
Unit division: 0
Unit division: 0
Unit division: 0
Unit division: 0
Unit division: 0
Unit division: 0

## 2023-09-09 LAB — GLUCOSE, CAPILLARY
Glucose-Capillary: 223 mg/dL — ABNORMAL HIGH (ref 70–99)
Glucose-Capillary: 220 mg/dL — ABNORMAL HIGH (ref 70–99)
Glucose-Capillary: 256 mg/dL — ABNORMAL HIGH (ref 70–99)
Glucose-Capillary: 161 mg/dL — ABNORMAL HIGH (ref 70–99)

## 2023-09-09 LAB — CBC
HCT: 22.5 % — ABNORMAL LOW (ref 36.0–46.0)
Hemoglobin: 7.3 g/dL — ABNORMAL LOW (ref 12.0–15.0)
MCH: 26.6 pg (ref 26.0–34.0)
MCHC: 32.4 g/dL (ref 30.0–36.0)
MCV: 82.1 fL (ref 80.0–100.0)
Platelets: 192 10*3/uL (ref 150–400)
RBC: 2.74 MIL/uL — ABNORMAL LOW (ref 3.87–5.11)
RDW: 16 % — ABNORMAL HIGH (ref 11.5–15.5)
WBC: 16.7 10*3/uL — ABNORMAL HIGH (ref 4.0–10.5)
nRBC: 0.2 % (ref 0.0–0.2)

## 2023-09-09 LAB — BASIC METABOLIC PANEL
Anion gap: 8 (ref 5–15)
BUN: 38 mg/dL — ABNORMAL HIGH (ref 8–23)
CO2: 25 mmol/L (ref 22–32)
Calcium: 7.2 mg/dL — ABNORMAL LOW (ref 8.9–10.3)
Chloride: 97 mmol/L — ABNORMAL LOW (ref 98–111)
Creatinine, Ser: 1.82 mg/dL — ABNORMAL HIGH (ref 0.44–1.00)
GFR, Estimated: 29 mL/min — ABNORMAL LOW (ref >60)
Glucose, Bld: 230 mg/dL — ABNORMAL HIGH (ref 70–99)
Potassium: 3.8 mmol/L (ref 3.5–5.1)
Sodium: 130 mmol/L — ABNORMAL LOW (ref 135–145)

## 2023-09-09 LAB — COOXEMETRY PANEL
Carboxyhemoglobin: 1.6 % — ABNORMAL HIGH (ref 0.5–1.5)
Methemoglobin: <0.7 % (ref 0.0–1.5)
O2 Saturation: 59.8 %
Total hemoglobin: 7.5 g/dL — ABNORMAL LOW (ref 12.0–16.0)

## 2023-09-09 LAB — POTASSIUM: Potassium: 3.4 mmol/L — ABNORMAL LOW (ref 3.5–5.1)

## 2023-09-09 MED ORDER — POTASSIUM CHLORIDE CRYS ER 20 MEQ PO TBCR
20.0000 meq | EXTENDED_RELEASE_TABLET | ORAL | Status: AC
Start: 2023-09-09 — End: 2023-09-09
  Administered 2023-09-09 (×3): 20 meq via ORAL
  Filled 2023-09-09 (×2): qty 1

## 2023-09-09 MED ORDER — CARVEDILOL 3.125 MG PO TABS
3.1250 mg | ORAL_TABLET | Freq: Two times a day (BID) | ORAL | Status: DC
  Filled 2023-09-09: qty 1

## 2023-09-09 MED ORDER — ORAL CARE MOUTH RINSE: 15.0000 mL | OROMUCOSAL | Status: DC | PRN

## 2023-09-09 MED ORDER — INSULIN ASPART 100 UNIT/ML IJ SOLN
10.0000 [IU] | Freq: Three times a day (TID) | INTRAMUSCULAR | Status: DC
Start: 2023-09-09 — End: 2023-09-18
  Administered 2023-09-09 – 2023-09-17 (×23): 10 [IU] via SUBCUTANEOUS

## 2023-09-09 MED ORDER — AMIODARONE IV BOLUS ONLY 150 MG/100ML
150.0000 mg | Freq: Once | INTRAVENOUS | Status: AC
  Administered 2023-09-09: 150 mg via INTRAVENOUS
  Filled 2023-09-09: qty 100

## 2023-09-09 MED ORDER — INSULIN GLARGINE-YFGN 100 UNIT/ML ~~LOC~~ SOLN
60.0000 [IU] | Freq: Two times a day (BID) | SUBCUTANEOUS | Status: DC
  Administered 2023-09-09 – 2023-09-17 (×18): 60 [IU] via SUBCUTANEOUS
  Filled 2023-09-09 (×20): qty 0.6

## 2023-09-09 NOTE — Progress Notes (Signed)
3 Days Post-Op Procedure(s) (LRB): CORONARY ARTERY BYPASS GRAFTING (CABG) TIMES FOUR USING LEFT INTERNAL MAMMARY ARTERY AND RIGHT GREATER SAPHENOUS VEIN, HARVESTED ENDOSCOPICALLY (N/A) MITRAL VALVE REPAIR USING CARPENTIER-MCCARTHY-ADAMS ANNUOPLASTY RING (N/A) TRANSESOPHAGEAL ECHOCARDIOGRAM (N/A) Subjective: Feels better, pain controlled  Objective: Vital signs in last 24 hours: Temp:  [97.7 F (36.5 C)-98.3 F (36.8 C)] 98.2 F (36.8 C) (11/23 0325) Pulse Rate:  [66-94] 82 (11/23 0745) Cardiac Rhythm: A-V Sequential paced (11/22 2100) Resp:  [17-37] 22 (11/23 0745) BP: (80-145)/(39-105) 109/92 (11/23 0745) SpO2:  [68 %-99 %] 99 % (11/23 0745) Arterial Line BP: (106-127)/(43-48) 121/43 (11/22 0900) Weight:  [103 kg] 103 kg (11/23 0500)  Hemodynamic parameters for last 24 hours: CVP:  [7 mmHg-78 mmHg] 13 mmHg  Intake/Output from previous day: 11/22 0701 - 11/23 0700 In: 543.4 [I.V.:479.8; IV Piggyback:63.6] Out: 2218 [Urine:1640; Chest Tube:578] Intake/Output this shift: No intake/output data recorded.  General appearance: alert, cooperative, and no distress Neurologic: intact Heart: regular rate and rhythm and + rub Lungs: diminished breath sounds bibasilar Abdomen: normal findings: soft, non-tender + air leak  Lab Results: Recent Labs    09/08/23 1649 09/09/23 0408  WBC 19.2* 16.7*  HGB 7.7* 7.3*  HCT 23.7* 22.5*  PLT 174 192   BMET:  Recent Labs    09/08/23 1649 09/09/23 0408  NA 129* 130*  K 4.8 3.8  CL 96* 97*  CO2 22 25  GLUCOSE 365* 230*  BUN 31* 38*  CREATININE 1.67* 1.82*  CALCIUM 7.4* 7.2*    PT/INR:  Recent Labs    09/06/23 1637  LABPROT 17.9*  INR 1.5*   ABG    Component Value Date/Time   PHART 7.317 (L) 09/07/2023 0108   HCO3 22.3 09/07/2023 0108   TCO2 24 09/07/2023 0108   ACIDBASEDEF 4.0 (H) 09/07/2023 0108   O2SAT 59.8 09/09/2023 0408   CBG (last 3)  Recent Labs    09/08/23 1127 09/08/23 1643 09/08/23 2102  GLUCAP  164* 284* 257*    Assessment/Plan: S/P Procedure(s) (LRB): CORONARY ARTERY BYPASS GRAFTING (CABG) TIMES FOUR USING LEFT INTERNAL MAMMARY ARTERY AND RIGHT GREATER SAPHENOUS VEIN, HARVESTED ENDOSCOPICALLY (N/A) MITRAL VALVE REPAIR USING CARPENTIER-MCCARTHY-ADAMS ANNUOPLASTY RING (N/A) TRANSESOPHAGEAL ECHOCARDIOGRAM (N/A) NEURO- intact CV- in SR on PO amiodarone, will restart coreg  Co-ox 60 on milrinone 0.25  On low dose norepi for BP RESP- CXR shows small bilatearl apical pneumothorax with mild SQ emphysema  Leave CT in place RENAL- creatinine elevated at 1.82  Decrease Lasix drip to 10 mg/hr  Supplement K ENDO- CBG poorly controlled again  With increased creatinine will hold milrinone  Increase long acting insulin to 60 U BID (home dose)  Meal coverage + SSI GI- tolerating diet Anemia secondary to ABL- stable. Monitor SCD + enoxaparin for DVT prophylaxis Deconditioning- mobilize   LOS: 12 days    Diana [name] 09/09/2023

## 2023-09-09 NOTE — Evaluation (Signed)
Physical Therapy Evaluation Patient Details Name: Diana [name] MRN: [id number] DOB: 1946-04-29 Today's Date: 09/09/2023  History of Present Illness  77 yo female admitted 11/11 for HF optimization pre CABG + MVR, R/LHC same date. 11/20 CABG x 4, MVR. PMhx: CHF, CAD, T2DM, HTN, obesity, HLD, COPD, neuropathy  Clinical Impression  Pt very pleasant and eager to walk. Pt is normally independent, working, and lives with spouse. Pt with decreased strength, transfers, gait and function who will benefit from acute therapy to maximize mobility, safety and independence to decrease burden of care. Pt educated for sternal precautions, sequence of transfers and progression. Pt does have 8 steps to enter home and will need to tolerate increased ambulation. Pt would benefit from HHPT.     HR 85-91 BP during gait 94/60 (70) After gait 119/104 (111) 87-98% on 4L      If plan is discharge home, recommend the following: A little help with bathing/dressing/bathroom;A lot of help with bathing/dressing/bathroom;Assistance with cooking/housework;Assist for transportation;Help with stairs or ramp for entrance   Can travel by private vehicle        Equipment Recommendations None recommended by PT  Recommendations for Other Services  OT consult    Functional Status Assessment Patient has had a recent decline in their functional status and demonstrates the ability to make significant improvements in function in a reasonable and predictable amount of time.     Precautions / Restrictions Precautions Precautions: Sternal Precaution Comments: pacer, chest tube      Mobility  Bed Mobility               General bed mobility comments: in chair on arrival and end of session    Transfers Overall transfer level: Needs assistance   Transfers: Sit to/from Stand Sit to Stand: Min assist           General transfer comment: pt with difficulty on initial attempt to rise from chair, 3 repeated  trials min assist with cues for hand placement, sequence and precautions    Ambulation/Gait Ambulation/Gait assistance: Contact guard assist Gait Distance (Feet): 60 Feet Assistive device: Rolling walker (2 wheels) Gait Pattern/deviations: Step-through pattern, Decreased stride length, Trunk flexed   Gait velocity interpretation: <1.8 ft/sec, indicate of risk for recurrent falls   General Gait Details: cues for posture, proximity to RW and close chair follow. Pt walked 35', 30', 60'  Stairs            Wheelchair Mobility     Tilt Bed    Modified Rankin (Stroke Patients Only)       Balance Overall balance assessment: Needs assistance   Sitting balance-Leahy Scale: Fair     Standing balance support: Bilateral upper extremity supported, During functional activity, Reliant on assistive device for balance Standing balance-Leahy Scale: Poor Standing balance comment: Rw for standing and gait                             Pertinent Vitals/Pain      Home Living Family/patient expects to be discharged to:: Private residence Living Arrangements: Spouse/significant other;Children Available Help at Discharge: Family;Available 24 hours/day Type of Home: House Home Access: Stairs to enter   Entrance Stairs-Number of Steps: 9   Home Layout: Two level;Able to live on main level with bedroom/bathroom;Laundry or work area in [employer]: Rollator (4 wheels);Grab bars - tub/shower;Shower seat;BSC/3in1 Additional Comments: works as an Environmental health practitioner at an [employer]    Prior  Function Prior Level of Function : Independent/Modified Independent;Working/employed;Driving                     Extremity/Trunk Assessment   Upper Extremity Assessment Upper Extremity Assessment: Generalized weakness    Lower Extremity Assessment Lower Extremity Assessment: Generalized weakness    Cervical / Trunk Assessment Cervical / Trunk Assessment:  Normal  Communication   Communication Communication: No apparent difficulties  Cognition Arousal: Alert Behavior During Therapy: WFL for tasks assessed/performed Overall Cognitive Status: Within Functional Limits for tasks assessed                                          General Comments      Exercises     Assessment/Plan    PT Assessment Patient needs continued PT services  PT Problem List Decreased strength;Decreased mobility;Decreased activity tolerance;Decreased balance;Decreased knowledge of use of DME;Decreased knowledge of precautions       PT Treatment Interventions DME instruction;Gait training;Stair training;Functional mobility training;Therapeutic activities;Patient/family education;Balance training;Therapeutic exercise    PT Goals (Current goals can be found in the Care Plan section)  Acute Rehab PT Goals Patient Stated Goal: return home by Thanksgiving PT Goal Formulation: With patient Time For Goal Achievement: 09/23/23 Potential to Achieve Goals: Good    Frequency Min 1X/week     Co-evaluation               AM-PAC PT "6 Clicks" Mobility  Outcome Measure Help needed turning from your back to your side while in a flat bed without using bedrails?: A Lot Help needed moving from lying on your back to sitting on the side of a flat bed without using bedrails?: A Lot Help needed moving to and from a bed to a chair (including a wheelchair)?: A Little Help needed standing up from a chair using your arms (e.g., wheelchair or bedside chair)?: A Little Help needed to walk in hospital room?: A Little Help needed climbing 3-5 steps with a railing? : Total 6 Click Score: 14    End of Session Equipment Utilized During Treatment: Gait belt;Oxygen Activity Tolerance: Patient tolerated treatment well Patient left: in chair;with call bell/phone within reach Nurse Communication: Mobility status PT Visit Diagnosis: Other abnormalities of gait and  mobility (R26.89);Muscle weakness (generalized) (M62.81)    Time: 0981-1914 PT Time Calculation (min) (ACUTE ONLY): 40 min   Charges:   PT Evaluation $PT Eval Moderate Complexity: 1 Mod PT Treatments $Gait Training: 8-22 mins $Therapeutic Activity: 8-22 mins PT General Charges $$ ACUTE PT VISIT: 1 Visit         Merryl Hacker, PT Acute Rehabilitation Services Office: [phone]   Enedina Finner Anysa Tacey 09/09/2023, 10:54 AM

## 2023-09-09 NOTE — Progress Notes (Signed)
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  11/13: AFL w/ RVR. Started on IV amio + heparin gtt. Milrinone discontinued    11/14: TEE: EF 25%, RV severely reduced, severe LAE, 3-4+ MR 11/19: RHC- RA 2, PA 45/17 (29), PCWP 11 vwaves to 18, CO/CI (thermo) 4.62/2.4, PVR 2w, PAPi 14 11/20: s/p CABG x4  (LIMA to LAD, SVG to diagonal, SVG to OM, SVG to acute marginal) + MVR by Dr. Dorris Fetch  Post-Op Day #3  On milrinone 0.25 NE 4.  Co-ox 60% CVP 16 Lasix gtt at 20.  Diuresed 2.2L overnight. Feels better. Less puffy. Weight down 3 pounds.   Scr 1.67-> 1.82   In AF this am     Objective:    Weight Range: 103 kg Body mass index is 38.98 kg/m.   Vital Signs:   Temp:  [97.3 F (36.3 C)-98.3 F (36.8 C)] 97.3 F (36.3 C) (11/23 1133) Pulse Rate:  [65-92] 72 (11/23 1500) Resp:  [16-35] 20 (11/23 1500) BP: (80-128)/(39-105) 120/52 (11/23 1500) SpO2:  [72 %-100 %] 96 % (11/23 1500) Weight:  [103 kg] 103 kg (11/23 0500) Last BM Date : 09/05/23  Weight change: Filed Weights   09/07/23 0615 09/08/23 0500 09/09/23 0500  Weight: 102.8 kg 104.2 kg 103 kg    Intake/Output:   Intake/Output Summary (Last 24 hours) at 09/09/2023 1550 Last data filed at 09/09/2023 1500 Gross per 24 hour  Intake 631.31 ml  Output 2553 ml  Net -1921.69 ml   Physical Exam    General:  Sitting in chair . No resp difficulty HEENT: normal Neck: supple. JVP to jaw. Carotids 2+ bilat; no bruits. No lymphadenopathy or thryomegaly appreciated. Cor: Irregular rate & rhythm. No rubs, gallops or murmurs. Lungs: decreased at bases Abdomen: obese soft, nontender, nondistended. No hepatosplenomegaly. No bruits or masses. Good bowel sounds. Extremities: no cyanosis, clubbing, rash, 2+ edema Neuro: alert & orientedx3, cranial nerves grossly intact.  moves all 4 extremities w/o difficulty. Affect pleasant  Telemetry   AF 70s Personally reviewed   Labs    CBC Recent Labs    09/08/23 1649 09/09/23 0408  WBC 19.2* 16.7*  HGB 7.7* 7.3*  HCT 23.7* 22.5*  MCV 82.9 82.1  PLT 174 192   Basic Metabolic Panel Recent Labs    52/84/13 0508 09/07/23 1626 09/08/23 0435 09/08/23 1649 09/09/23 0408  NA 140 136   < > 129* 130*  K 3.8 4.7   < > 4.8 3.8  CL 108 103   < > 96* 97*  CO2 24 24   < > 22 25  GLUCOSE 150* 91   < > 365* 230*  BUN 14 16   < > 31* 38*  CREATININE 1.06* 1.32*   < > 1.67* 1.82*  CALCIUM 7.2* 7.4*   < > 7.4* 7.2*  MG 2.9* 2.6*  --   --   --    < > = values in this interval not displayed.   Liver Function Tests No results for input(s): "AST", "ALT", "ALKPHOS", "BILITOT", "PROT", "ALBUMIN" in the last 72 hours.   No results for input(s): "LIPASE", "AMYLASE" in the last 72 hours. Cardiac Enzymes No results for input(s): "CKTOTAL", "CKMB", "CKMBINDEX", "TROPONINI" in the last 72 hours.  BNP: BNP (last 3 results) No results for input(s): "BNP" in the last  8760 hours.  ProBNP (last 3 results) No results for input(s): "PROBNP" in the last 8760 hours.   D-Dimer No results for input(s): "DDIMER" in the last 72 hours. Hemoglobin A1C No results for input(s): "HGBA1C" in the last 72 hours.  Fasting Lipid Panel No results for input(s): "CHOL", "HDL", "LDLCALC", "TRIG", "CHOLHDL", "LDLDIRECT" in the last 72 hours. Thyroid Function Tests No results for input(s): "TSH", "T4TOTAL", "T3FREE", "THYROIDAB" in the last 72 hours.  Invalid input(s): "FREET3"  Imaging   DG Chest Port 1 View  Result Date: 09/09/2023 CLINICAL DATA:  [id number] Pneumothorax on right [id number] EXAM: PORTABLE CHEST 1 VIEW COMPARISON:  September 08, 2023 FINDINGS: The cardiomediastinal silhouette is unchanged in contour.Status post median sternotomy and CABG. LEFT-sided chest tube and midline drain. LEFT IJ approach PA catheter sheath tip  terminates over the LEFT brachiocephalic vein. Small RIGHT pneumothorax, similar in comparison to prior. Small LEFT pneumothorax. Patchy LEFT basilar opacities, likely atelectasis. Query small bilateral pleural effusions. LEFT-sided subcutaneous air. Temporary cardiac pacing wires. IMPRESSION: 1. Small RIGHT pneumothorax, similar in comparison to prior. 2. Small LEFT pneumothorax with increased LEFT-sided subcutaneous air. Electronically Signed   By: Meda Klinefelter M.D.   On: 09/09/2023 09:06    Medications:    Scheduled Medications:  acetaminophen  1,000 mg Oral Q6H   Or   acetaminophen (TYLENOL) oral liquid 160 mg/5 mL  1,000 mg Per Tube Q6H   amiodarone  400 mg Oral BID   aspirin EC  325 mg Oral Daily   Or   aspirin  324 mg Per Tube Daily   bisacodyl  10 mg Oral Daily   Or   bisacodyl  10 mg Rectal Daily   carvedilol  3.125 mg Oral BID WC   Chlorhexidine Gluconate Cloth  6 each Topical Daily   docusate sodium  200 mg Oral Daily   enoxaparin (LOVENOX) injection  40 mg Subcutaneous QHS   ezetimibe  10 mg Oral Daily   gabapentin  100 mg Oral QHS   insulin aspart  0-20 Units Subcutaneous TID WC   insulin aspart  0-5 Units Subcutaneous QHS   insulin aspart  10 Units Subcutaneous TID WC   insulin glargine-yfgn  60 Units Subcutaneous BID   loratadine  10 mg Oral Daily   pantoprazole  40 mg Oral Daily   rosuvastatin  40 mg Oral Daily   sodium chloride flush  3 mL Intravenous Q12H    Infusions:  albumin human Stopped (09/06/23 2310)   furosemide (LASIX) 200 mg in dextrose 5 % 100 mL (2 mg/mL) infusion 10 mg/hr (09/09/23 1502)   milrinone 0.25 mcg/kg/min (09/09/23 1500)   norepinephrine (LEVOPHED) Adult infusion 4 mcg/min (09/09/23 1500)    PRN Medications: albumin human, metoprolol tartrate, midazolam, morphine injection, ondansetron (ZOFRAN) IV, oxyCODONE, sodium chloride flush, traMADol  Patient Profile   77 y/o female w/ h/o CAD s/p remote LAD and RCA PCI in 2011, Type 2DM  and HLD, recently diagnosed w/ new systolic heart failure and severe MR by echo. 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 AHF team consulted for HF optimization prior to CABG + MVR.  Assessment/Plan   1. Acute Systolic Heart Failure w/ Low Output  - Echo 2014 w/ normal EF 50-55%, normal RV - Echo 11/24 EF 25-30%, severe MR, RV low normal  - Ischemic CM. LHC w/ severe MVCAD.  - RHC c/w decompensated heart failure, severe LV dysfunction, and severe mitral regurgitation (RA  21, PAP 83/37/56, PCWP 36, CO 4.11, CI 2.04) - 11/19 RHC: RA 2, PA 45/17 (29), PCWP 11 vwaves to 18, CO/CI (thermo) 4.62/2.4, PVR 2w, PAPi 14 - TEE: EF 25%, RV severely reduced, severe LAE, 3-4+ MR - S/p CABG X 4 (LIMA to LAD, SVG to diagonal, SVG to OM, SVG to acute marginal) + MVR on 11/20 - Co-ox 60% on NE 4 and milrinone 0.25 CVP 15  Weight still up 20 pounds - Continue lasix gtt. Will decrease from 20 -> 10 per TCTS  2. Severe MR  - Now s/p MVR 11/20 at time of CABG on 11/20 - appears ischemic w/ posterior leaflet tethering on echo  - cath with giant V waves of greater than 60 mmHg + elevated BiV filling pressures - TEE EF 25% 3-4+ MR    3. Severe MVCAD  - Now s/p CABG X 4 (LIMA to LAD, SVG to diagonal, SVG to OM, SVG to acute marginal) on 11/20 - Aspirin + statin  4. PAF/atrial tach  - back in AF today despite oral amio - will give 150 IV. Restart gtt as needed - Consider AC soon   5. Type 2DM  - Hgb A1c 7.8  - Insulin gtt per TCTS  6. AKI:  - in the setting of volume overload and diuresis - 1.06>1.32>1.16 -> 1.67 -> 1.8  - Cut back alsix as above  7. Acute blood loss anemia: post procedure - hgb 7.5 today  CRITICAL CARE Performed by: Arvilla Meres  Total critical care time: 40 minutes  Critical care time was exclusive of separately billable procedures and treating other patients.  Critical care was  necessary to treat or prevent imminent or life-threatening deterioration.  Critical care was time spent personally by me (independent of midlevel providers or residents) on the following activities: development of treatment plan with patient and/or surrogate as well as nursing, discussions with consultants, evaluation of patient's response to treatment, examination of patient, obtaining history from patient or surrogate, ordering and performing treatments and interventions, ordering and review of laboratory studies, ordering and review of radiographic studies, pulse oximetry and re-evaluation of patient's condition.  Length of Stay: 12  Arvilla Meres, MD  09/09/2023, 3:50 PM  Advanced Heart Failure Team Pager [phone] (M-F; 7a - 5p)  Please contact [facility] for night-coverage after hours (5p -7a ) and weekends on [email or web address]

## 2023-09-09 NOTE — Progress Notes (Signed)
      [address]       [place] [address]             [phone]      Resting comfortably, in good spirits Ambulated earlier  BP (!) 107/58   Pulse 81   Temp 98.2 F (36.8 C) (Oral)   Resp (!) 23   Ht 5\' 4"  (1.626 m)   Wt 103 kg   SpO2 98%   BMI 38.98 kg/m  4L [place] 92% sat Milrinone 0.25 Lasix 10   Intake/Output Summary (Last 24 hours) at 09/09/2023 1740 Last data filed at 09/09/2023 1600 Gross per 24 hour  Intake 547.59 ml  Output 3253 ml  Net -2705.41 ml   Looks great this evening  Viviann Spare C. Dorris Fetch, MD [facility] [phone]

## 2023-09-10 ENCOUNTER — Inpatient Hospital Stay (HOSPITAL_COMMUNITY): Payer: Medicare HMO

## 2023-09-10 DIAGNOSIS — N179 Acute kidney failure, unspecified: Secondary | ICD-10-CM | POA: Diagnosis not present

## 2023-09-10 DIAGNOSIS — I5021 Acute systolic (congestive) heart failure: Secondary | ICD-10-CM | POA: Diagnosis not present

## 2023-09-10 LAB — CBC
HCT: 22.1 % — ABNORMAL LOW (ref 36.0–46.0)
Hemoglobin: 7.2 g/dL — ABNORMAL LOW (ref 12.0–15.0)
MCH: 26.4 pg (ref 26.0–34.0)
MCHC: 32.6 g/dL (ref 30.0–36.0)
MCV: 81 fL (ref 80.0–100.0)
Platelets: 223 10*3/uL (ref 150–400)
RBC: 2.73 MIL/uL — ABNORMAL LOW (ref 3.87–5.11)
RDW: 15.9 % — ABNORMAL HIGH (ref 11.5–15.5)
WBC: 14.4 10*3/uL — ABNORMAL HIGH (ref 4.0–10.5)
nRBC: 1.4 % — ABNORMAL HIGH (ref 0.0–0.2)

## 2023-09-10 LAB — BASIC METABOLIC PANEL
Anion gap: 11 (ref 5–15)
BUN: 44 mg/dL — ABNORMAL HIGH (ref 8–23)
CO2: 28 mmol/L (ref 22–32)
Calcium: 7.7 mg/dL — ABNORMAL LOW (ref 8.9–10.3)
Chloride: 93 mmol/L — ABNORMAL LOW (ref 98–111)
Creatinine, Ser: 1.91 mg/dL — ABNORMAL HIGH (ref 0.44–1.00)
GFR, Estimated: 27 mL/min — ABNORMAL LOW (ref >60)
Glucose, Bld: 127 mg/dL — ABNORMAL HIGH (ref 70–99)
Potassium: 3.6 mmol/L (ref 3.5–5.1)
Sodium: 132 mmol/L — ABNORMAL LOW (ref 135–145)

## 2023-09-10 LAB — COOXEMETRY PANEL
Carboxyhemoglobin: 1.4 % (ref 0.5–1.5)
Methemoglobin: 1.3 % (ref 0.0–1.5)
O2 Saturation: 58.2 %
Total hemoglobin: 8.1 g/dL — ABNORMAL LOW (ref 12.0–16.0)

## 2023-09-10 LAB — GLUCOSE, CAPILLARY
Glucose-Capillary: 116 mg/dL — ABNORMAL HIGH (ref 70–99)
Glucose-Capillary: 146 mg/dL — ABNORMAL HIGH (ref 70–99)
Glucose-Capillary: 200 mg/dL — ABNORMAL HIGH (ref 70–99)
Glucose-Capillary: 126 mg/dL — ABNORMAL HIGH (ref 70–99)

## 2023-09-10 LAB — PREPARE RBC (CROSSMATCH)

## 2023-09-10 MED ORDER — ASPIRIN 81 MG PO TBEC
81.0000 mg | DELAYED_RELEASE_TABLET | Freq: Every day | ORAL | Status: DC
  Administered 2023-09-10 – 2023-09-21 (×12): 81 mg via ORAL
  Filled 2023-09-10 (×12): qty 1

## 2023-09-10 MED ORDER — POTASSIUM CHLORIDE 10 MEQ/50ML IV SOLN
10.0000 meq | INTRAVENOUS | Status: AC
  Administered 2023-09-10 (×3): 10 meq via INTRAVENOUS
  Filled 2023-09-10 (×3): qty 50

## 2023-09-10 MED ORDER — AMIODARONE LOAD VIA INFUSION
150.0000 mg | Freq: Once | INTRAVENOUS | Status: AC
  Administered 2023-09-10: 150 mg via INTRAVENOUS
  Filled 2023-09-10: qty 83.34

## 2023-09-10 MED ORDER — AMIODARONE HCL IN DEXTROSE 360-4.14 MG/200ML-% IV SOLN
30.0000 mg/h | INTRAVENOUS | Status: DC
  Administered 2023-09-10 – 2023-09-11 (×4): 30 mg/h via INTRAVENOUS
  Filled 2023-09-10 (×3): qty 200

## 2023-09-10 MED ORDER — SODIUM CHLORIDE 0.9% IV SOLUTION: Freq: Once | INTRAVENOUS | Status: DC

## 2023-09-10 MED ORDER — POTASSIUM CHLORIDE 10 MEQ/50ML IV SOLN
10.0000 meq | INTRAVENOUS | Status: AC
  Administered 2023-09-10 (×2): 10 meq via INTRAVENOUS
  Filled 2023-09-10: qty 50

## 2023-09-10 MED ORDER — AMIODARONE HCL IN DEXTROSE 360-4.14 MG/200ML-% IV SOLN
60.0000 mg/h | INTRAVENOUS | Status: AC
  Administered 2023-09-10 (×2): 60 mg/h via INTRAVENOUS
  Filled 2023-09-10 (×2): qty 200

## 2023-09-10 NOTE — Progress Notes (Signed)
4 Days Post-Op Procedure(s) (LRB): CORONARY ARTERY BYPASS GRAFTING (CABG) TIMES FOUR USING LEFT INTERNAL MAMMARY ARTERY AND RIGHT GREATER SAPHENOUS VEIN, HARVESTED ENDOSCOPICALLY (N/A) MITRAL VALVE REPAIR USING CARPENTIER-MCCARTHY-ADAMS ANNUOPLASTY RING (N/A) TRANSESOPHAGEAL ECHOCARDIOGRAM (N/A) Subjective: No complaints this morning  Objective: Vital signs in last 24 hours: Temp:  [97.3 F (36.3 C)-98.3 F (36.8 C)] 98.3 F (36.8 C) (11/24 0709) Pulse Rate:  [65-93] 93 (11/24 0600) Cardiac Rhythm: Normal sinus rhythm (11/24 0400) Resp:  [16-32] 18 (11/24 0600) BP: (86-127)/(45-85) 94/52 (11/24 0600) SpO2:  [72 %-100 %] 95 % (11/24 0600) Weight:  [101.3 kg] 101.3 kg (11/24 0600)  Hemodynamic parameters for last 24 hours: CVP:  [7 mmHg-14 mmHg] 9 mmHg  Intake/Output from previous day: 11/23 0701 - 11/24 0700 In: 819.9 [P.O.:75; I.V.:609.8; IV Piggyback:135.2] Out: 4995 [Urine:4350; Chest Tube:645] Intake/Output this shift: No intake/output data recorded.  General appearance: alert, cooperative, and no distress Neurologic: intact Heart: irregularly irregular rhythm and + rub Lungs: diminished breath sounds bibasilar Abdomen: normal findings: soft, non-tender  Lab Results: Recent Labs    09/09/23 0408 09/10/23 0319  WBC 16.7* 14.4*  HGB 7.3* 7.2*  HCT 22.5* 22.1*  PLT 192 223   BMET:  Recent Labs    09/09/23 0408 09/09/23 2259 09/10/23 0319  NA 130*  --  132*  K 3.8 3.4* 3.6  CL 97*  --  93*  CO2 25  --  28  GLUCOSE 230*  --  127*  BUN 38*  --  44*  CREATININE 1.82*  --  1.91*  CALCIUM 7.2*  --  7.7*    PT/INR: No results for input(s): "LABPROT", "INR" in the last 72 hours. ABG    Component Value Date/Time   PHART 7.317 (L) 09/07/2023 0108   HCO3 22.3 09/07/2023 0108   TCO2 24 09/07/2023 0108   ACIDBASEDEF 4.0 (H) 09/07/2023 0108   O2SAT 58.2 09/10/2023 0318   CBG (last 3)  Recent Labs    09/09/23 1624 09/09/23 2215 09/10/23 0624  GLUCAP  256* 161* 116*    Assessment/Plan: S/P Procedure(s) (LRB): CORONARY ARTERY BYPASS GRAFTING (CABG) TIMES FOUR USING LEFT INTERNAL MAMMARY ARTERY AND RIGHT GREATER SAPHENOUS VEIN, HARVESTED ENDOSCOPICALLY (N/A) MITRAL VALVE REPAIR USING CARPENTIER-MCCARTHY-ADAMS ANNUOPLASTY RING (N/A) TRANSESOPHAGEAL ECHOCARDIOGRAM (N/A) - NEURO- intact CV- back in a fib this AM, rate controlled  On amiodarone, QTc 546- will d/w Cardiology  Co-ox 58 on milrinone  ASA, statin, beta blocker (Coreg),  Will need anticoagulation once pacing wires are out RESP_ continue IS  No air leak, no pneumo on CXR- dc chest tubes RENAL- diuresed well overnight, 4L - over past 24 hours  Creatinine up slightly  Supplement K ENDO- CBG better controlled GI- tolerating diet Deconditioning- mobilize Anemia stable, monitor SCD + enoxaparin   LOS: 13 days    [name] 09/10/2023

## 2023-09-10 NOTE — Evaluation (Signed)
Occupational Therapy Evaluation Patient Details Name: BRENLYNN BIN MRN: [id number] DOB: 1946/06/28 Today's Date: 09/10/2023   History of Present Illness 77 yo female admitted 11/11 for HF optimization pre CABG + MVR, R/LHC same date. 11/20 CABG x 4, MVR. PMhx: CHF, CAD, T2DM, HTN, obesity, HLD, COPD, neuropathy   Clinical Impression   PTA, pt lived with her spouse and was mod I. Upon eval, pt presents with BUE tremor, decreased strength, balnce, activity tolerance, and knowledge of precautions. Pt requiring up to max A for LB ADL and Mod A for UB ADL. Will continue to follow to optimize safety and independence in ADL and IADL. Recommending HHOT to optimize safety and carryover of precautions in pt's natural context.       If plan is discharge home, recommend the following: A little help with walking and/or transfers;A lot of help with bathing/dressing/bathroom;Assistance with cooking/housework;Assist for transportation;Help with stairs or ramp for entrance    Functional Status Assessment  Patient has had a recent decline in their functional status and demonstrates the ability to make significant improvements in function in a reasonable and predictable amount of time.  Equipment Recommendations  None recommended by OT    Recommendations for Other Services       Precautions / Restrictions Precautions Precautions: Sternal Precaution Booklet Issued: Yes (comment) Precaution Comments: pacer, chest tube Restrictions Weight Bearing Restrictions: Yes (sternal precautions)      Mobility Bed Mobility               General bed mobility comments: in hall on arrival    Transfers Overall transfer level: Needs assistance Equipment used: None Transfers: Sit to/from Stand Sit to Stand: Min assist                  Balance Overall balance assessment: Needs assistance   Sitting balance-Leahy Scale: Fair     Standing balance support: Bilateral upper extremity supported,  During functional activity, Reliant on assistive device for balance Standing balance-Leahy Scale: Poor Standing balance comment: EVA                           ADL either performed or assessed with clinical judgement   ADL Overall ADL's : Needs assistance/impaired Eating/Feeding: Moderate assistance;Bed level Eating/Feeding Details (indicate cue type and reason): Able to spear food inconsistently secondary to tremor Grooming: Minimal assistance;Sitting;Standing   Upper Body Bathing: Minimal assistance;Sitting   Lower Body Bathing: Maximal assistance;Sit to/from stand   Upper Body Dressing : Moderate assistance;Sitting   Lower Body Dressing: Maximal assistance;Sit to/from stand   Toilet Transfer: Ambulation;Minimal assistance (EVA) Statistician Details (indicate cue type and reason): A for rise         Functional mobility during ADLs: Contact guard assist (EVA)       Vision Baseline Vision/History: 1 Wears glasses Ability to See in Adequate Light: 0 Adequate Patient Visual Report: No change from baseline Vision Assessment?: No apparent visual deficits     Perception Perception: Not tested       Praxis Praxis: Not tested       Pertinent Vitals/Pain Pain Assessment Pain Assessment: Faces Faces Pain Scale: No hurt Pain Intervention(s): Monitored during session     Extremity/Trunk Assessment Upper Extremity Assessment Upper Extremity Assessment: Generalized weakness (BUE tremor pt reports is baseline but has been significantly exacerbated with procedure)   Lower Extremity Assessment Lower Extremity Assessment: Generalized weakness   Cervical / Trunk Assessment Cervical / Trunk Assessment:  Normal   Communication Communication Communication: No apparent difficulties   Cognition Arousal: Alert Behavior During Therapy: WFL for tasks assessed/performed Overall Cognitive Status: Within Functional Limits for tasks assessed                                  General Comments: Good awareness of sternal precautions.     General Comments  VSS    Exercises     Shoulder Instructions      Home Living Family/patient expects to be discharged to:: Private residence Living Arrangements: Spouse/significant other;Children Available Help at Discharge: Family;Available 24 hours/day Type of Home: House Home Access: Stairs to enter Entrance Stairs-Number of Steps: 9   Home Layout: Two level;Able to live on main level with bedroom/bathroom;Laundry or work area in basement     [employer] Shower/Tub: Chief Strategy Officer: Standard     Home Equipment: Rollator (4 wheels);Grab bars - tub/shower;Shower seat;BSC/3in1   Additional Comments: works as an Environmental health practitioner [place]      Prior Functioning/Environment Prior Level of Function : Independent/Modified Independent;Working/employed;Driving                        OT Problem List: Decreased activity tolerance;Decreased strength;Impaired balance (sitting and/or standing);Impaired UE functional use;Cardiopulmonary status limiting activity;Decreased safety awareness;Decreased knowledge of use of DME or AE;Decreased coordination      OT Treatment/Interventions: Self-care/ADL training;Therapeutic exercise;DME and/or AE instruction;Balance training;Patient/family education;Therapeutic activities    OT Goals(Current goals can be found in the care plan section) Acute Rehab OT Goals Patient Stated Goal: go home OT Goal Formulation: With patient Time For Goal Achievement: 09/24/23 Potential to Achieve Goals: Good  OT Frequency: Min 1X/week    Co-evaluation              AM-PAC OT "6 Clicks" Daily Activity     Outcome Measure Help from another person eating meals?: A Lot Help from another person taking care of personal grooming?: A Little Help from another person toileting, which includes using toliet, bedpan, or urinal?: A Lot Help from  another person bathing (including washing, rinsing, drying)?: A Lot Help from another person to put on and taking off regular upper body clothing?: A Lot Help from another person to put on and taking off regular lower body clothing?: A Lot 6 Click Score: 13   End of Session Equipment Utilized During Treatment: Gait belt;Oxygen;Other (comment) (EVA) Nurse Communication: Mobility status  Activity Tolerance: Patient tolerated treatment well Patient left: in bed;with call bell/phone within reach;with bed alarm set;with nursing/sitter in room  OT Visit Diagnosis: Unsteadiness on feet (R26.81);Muscle weakness (generalized) (M62.81);Other (comment) (decresaed activity tolerance)                Time: 1610-9604 OT Time Calculation (min): 34 min Charges:  OT General Charges $OT Visit: 1 Visit OT Evaluation $OT Eval Moderate Complexity: 1 Mod OT Treatments $Self Care/Home Management : 8-22 mins  Tyler Deis, OTR/L [facility] Acute Rehabilitation Office: [phone]   Myrla Halsted 09/10/2023, 12:08 PM

## 2023-09-10 NOTE — Progress Notes (Signed)
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  11/13: AFL w/ RVR. Started on IV amio + heparin gtt. Milrinone discontinued    11/14: TEE: EF 25%, RV severely reduced, severe LAE, 3-4+ MR 11/19: RHC- RA 2, PA 45/17 (29), PCWP 11 vwaves to 18, CO/CI (thermo) 4.62/2.4, PVR 2w, PAPi 14 11/20: s/p CABG x4  (LIMA to LAD, SVG to diagonal, SVG to OM, SVG to acute marginal) + MVR by Dr. Dorris Fetch  Post-Op Day #4  On milrinone 0.25 NE 2.  Co-ox 58% CVP 12  Remains on lasix gtt at 10. Diuresing well. 5L out.Weight down 4 pounds (still up 20 pounds from pre-op)  Lasix gtt at 20.  Diuresed 2.2L overnight. Feels better but still very fatigued. No SOB.   Scr 1.67-> 1.82 -> 1.91  Remains in AFL with rates 90-110   Objective:    Weight Range: 101.3 kg Body mass index is 38.33 kg/m.   Vital Signs:   Temp:  [97.3 F (36.3 C)-98.3 F (36.8 C)] 98.3 F (36.8 C) (11/24 0709) Pulse Rate:  [65-119] 119 (11/24 0915) Resp:  [16-26] 26 (11/24 0915) BP: (86-122)/(45-85) 100/52 (11/24 0915) SpO2:  [72 %-100 %] 97 % (11/24 0915) Weight:  [101.3 kg] 101.3 kg (11/24 0600) Last BM Date : 09/05/23  Weight change: Filed Weights   09/08/23 0500 09/09/23 0500 09/10/23 0600  Weight: 104.2 kg 103 kg 101.3 kg    Intake/Output:   Intake/Output Summary (Last 24 hours) at 09/10/2023 1016 Last data filed at 09/10/2023 0900 Gross per 24 hour  Intake 796.13 ml  Output 5260 ml  Net -4463.87 ml   Physical Exam    General:  Sitting in chair . No resp difficulty HEENT: normal Neck: supple. RIJ introducer Carotids 2+ bilat; no bruits. No lymphadenopathy or thryomegaly appreciated. Cor: Irregular rate & rhythm. No rubs, gallops or murmurs. Lungs: clear but decreased Abdomen: obese soft, nontender, nondistended. Good bowel sounds. Extremities:  no cyanosis, clubbing, rash, 2+ edema Neuro: alert & orientedx3, cranial nerves grossly intact. moves all 4 extremities w/o difficulty. Affect pleasant   Telemetry   AF 70s Personally reviewed   Labs    CBC Recent Labs    09/09/23 0408 09/10/23 0319  WBC 16.7* 14.4*  HGB 7.3* 7.2*  HCT 22.5* 22.1*  MCV 82.1 81.0  PLT 192 223   Basic Metabolic Panel Recent Labs    82/95/62 1626 09/08/23 0435 09/09/23 0408 09/09/23 2259 09/10/23 0319  NA 136   < > 130*  --  132*  K 4.7   < > 3.8 3.4* 3.6  CL 103   < > 97*  --  93*  CO2 24   < > 25  --  28  GLUCOSE 91   < > 230*  --  127*  BUN 16   < > 38*  --  44*  CREATININE 1.32*   < > 1.82*  --  1.91*  CALCIUM 7.4*   < > 7.2*  --  7.7*  MG 2.6*  --   --   --   --    < > = values in this interval not displayed.   Liver Function Tests No results for input(s): "AST", "ALT", "ALKPHOS", "BILITOT", "PROT", "ALBUMIN" in the last 72 hours.   No results for input(s): "LIPASE", "AMYLASE" in the  last 72 hours. Cardiac Enzymes No results for input(s): "CKTOTAL", "CKMB", "CKMBINDEX", "TROPONINI" in the last 72 hours.  BNP: BNP (last 3 results) No results for input(s): "BNP" in the last 8760 hours.  ProBNP (last 3 results) No results for input(s): "PROBNP" in the last 8760 hours.   D-Dimer No results for input(s): "DDIMER" in the last 72 hours. Hemoglobin A1C No results for input(s): "HGBA1C" in the last 72 hours.  Fasting Lipid Panel No results for input(s): "CHOL", "HDL", "LDLCALC", "TRIG", "CHOLHDL", "LDLDIRECT" in the last 72 hours. Thyroid Function Tests No results for input(s): "TSH", "T4TOTAL", "T3FREE", "THYROIDAB" in the last 72 hours.  Invalid input(s): "FREET3"  Imaging   DG Chest Port 1 View  Result Date: 09/10/2023 CLINICAL DATA:  [id number] Pneumothorax [id number] EXAM: PORTABLE CHEST 1 VIEW COMPARISON:  September 09, 2023 FINDINGS: The cardiomediastinal silhouette is unchanged in contour.Status post median sternotomy  and cardiac valve replacement. Midline surgical drain and LEFT-sided chest tube. LEFT IJ sheath. No pleural effusion. Decreased conspicuity of bilateral pneumothoraces with a trace residual RIGHT-sided pneumothorax. Patchy LEFT basilar heterogeneous opacities, similar comparison to prior. Bilateral subcutaneous air IMPRESSION: 1. Decreased conspicuity of bilateral pneumothoraces with a trace residual RIGHT-sided pneumothorax. 2. Patchy LEFT basilar heterogeneous opacities, similar comparison to prior. Electronically Signed   By: Meda Klinefelter M.D.   On: 09/10/2023 09:15    Medications:    Scheduled Medications:  acetaminophen  1,000 mg Oral Q6H   Or   acetaminophen (TYLENOL) oral liquid 160 mg/5 mL  1,000 mg Per Tube Q6H   amiodarone  400 mg Oral BID   aspirin EC  81 mg Oral Daily   bisacodyl  10 mg Oral Daily   Or   bisacodyl  10 mg Rectal Daily   carvedilol  3.125 mg Oral BID WC   Chlorhexidine Gluconate Cloth  6 each Topical Daily   docusate sodium  200 mg Oral Daily   enoxaparin (LOVENOX) injection  40 mg Subcutaneous QHS   ezetimibe  10 mg Oral Daily   gabapentin  100 mg Oral QHS   insulin aspart  0-20 Units Subcutaneous TID WC   insulin aspart  0-5 Units Subcutaneous QHS   insulin aspart  10 Units Subcutaneous TID WC   insulin glargine-yfgn  60 Units Subcutaneous BID   loratadine  10 mg Oral Daily   pantoprazole  40 mg Oral Daily   rosuvastatin  40 mg Oral Daily   sodium chloride flush  3 mL Intravenous Q12H    Infusions:  albumin human Stopped (09/06/23 2310)   amiodarone 60 mg/hr (09/10/23 0910)   Followed by   amiodarone     furosemide (LASIX) 200 mg in dextrose 5 % 100 mL (2 mg/mL) infusion 10 mg/hr (09/10/23 0900)   milrinone 0.25 mcg/kg/min (09/10/23 0900)   norepinephrine (LEVOPHED) Adult infusion 2 mcg/min (09/10/23 0900)   potassium chloride 50 mL/hr at 09/10/23 0900    PRN Medications: albumin human, metoprolol tartrate, midazolam, morphine injection,  ondansetron (ZOFRAN) IV, mouth rinse, oxyCODONE, sodium chloride flush, traMADol  Patient Profile   77 y/o female w/ h/o CAD s/p remote LAD and RCA PCI in 2011, Type 2DM and HLD, recently diagnosed w/ new systolic heart failure and severe MR by echo. 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 AHF team consulted for HF optimization prior to CABG + MVR.  Assessment/Plan   1. Acute Systolic Heart Failure w/ Low Output  - Echo 2014  w/ normal EF 50-55%, normal RV - Echo 11/24 EF 25-30%, severe MR, RV low normal  - Ischemic CM. LHC w/ severe MVCAD.  - RHC c/w decompensated heart failure, severe LV dysfunction, and severe mitral regurgitation (RA 21, PAP 83/37/56, PCWP 36, CO 4.11, CI 2.04) - 11/19 RHC: RA 2, PA 45/17 (29), PCWP 11 vwaves to 18, CO/CI (thermo) 4.62/2.4, PVR 2w, PAPi 14 - TEE: EF 25%, RV severely reduced, severe LAE, 3-4+ MR - S/p CABG X 4 (LIMA to LAD, SVG to diagonal, SVG to OM, SVG to acute marginal) + MVR on 11/20 - Co-ox 58% on NE 2 and milrinone 0.25 CVP 12  Weight still up 20 pounds - Continue lasix gtt at 10   2. Severe MR  - appears ischemic w/ posterior leaflet tethering on echo. TEE 3-4+ MR - Now s/p MVR 11/20 at time of CABG on 11/20   3. Severe MVCAD  - Now s/p CABG X 4 (LIMA to LAD, SVG to diagonal, SVG to OM, SVG to acute marginal) on 11/20 - Aspirin + statin - No s/s angina - Ambulate/ IS  4. PAF/atrial tach  - Remains in AFL. I tried to pace her out but was unsuccessful - restart IV amio  - Consider AC soon   5. Type 2DM  - Hgb A1c 7.8  - Insulin gtt per TCTS  6. AKI:  - in the setting of volume overload and diuresis - 1.06>1.32>1.16 -> 1.67 -> 1.8 -> 1.9 - Watch with diuresis  7. Acute blood loss anemia: post procedure - hgb 7.2 today -> will give 1u RBC  CRITICAL CARE Performed by: Arvilla Meres  Total critical care time: 40 minutes  Critical care  time was exclusive of separately billable procedures and treating other patients.  Critical care was necessary to treat or prevent imminent or life-threatening deterioration.  Critical care was time spent personally by me (independent of midlevel providers or residents) on the following activities: development of treatment plan with patient and/or surrogate as well as nursing, discussions with consultants, evaluation of patient's response to treatment, examination of patient, obtaining history from patient or surrogate, ordering and performing treatments and interventions, ordering and review of laboratory studies, ordering and review of radiographic studies, pulse oximetry and re-evaluation of patient's condition.  Length of Stay: 13  Arvilla Meres, MD  09/10/2023, 10:16 AM  Advanced Heart Failure Team Pager [phone] (M-F; 7a - 5p)  Please contact [facility] for night-coverage after hours (5p -7a ) and weekends on [email or web address]

## 2023-09-10 NOTE — Progress Notes (Signed)
      [address]       [place] [address]             [phone]      Comfortable  BP (!) 88/54   Pulse 93   Temp 98 F (36.7 C) (Oral)   Resp 17   Ht 5\' 4"  (1.626 m)   Wt 101.3 kg   SpO2 97%   BMI 38.33 kg/m  2L [place] 97% sat Milrinone 0.25, norepi 2   Intake/Output Summary (Last 24 hours) at 09/10/2023 1738 Last data filed at 09/10/2023 1700 Gross per 24 hour  Intake 1688.64 ml  Output 5125 ml  Net -3436.36 ml   CBG a little better, but still elevated In atrial flutter, back on IV amiodarone  Viviann Spare C. Dorris Fetch, MD [facility] [phone]

## 2023-09-11 ENCOUNTER — Inpatient Hospital Stay (HOSPITAL_COMMUNITY): Payer: Medicare HMO

## 2023-09-11 DIAGNOSIS — I5023 Acute on chronic systolic (congestive) heart failure: Secondary | ICD-10-CM | POA: Diagnosis not present

## 2023-09-11 LAB — BASIC METABOLIC PANEL
Anion gap: 12 (ref 5–15)
Anion gap: 11 (ref 5–15)
BUN: 42 mg/dL — ABNORMAL HIGH (ref 8–23)
BUN: 44 mg/dL — ABNORMAL HIGH (ref 8–23)
CO2: 31 mmol/L (ref 22–32)
CO2: 29 mmol/L (ref 22–32)
Calcium: 8 mg/dL — ABNORMAL LOW (ref 8.9–10.3)
Calcium: 7.7 mg/dL — ABNORMAL LOW (ref 8.9–10.3)
Chloride: 89 mmol/L — ABNORMAL LOW (ref 98–111)
Chloride: 89 mmol/L — ABNORMAL LOW (ref 98–111)
Creatinine, Ser: 1.75 mg/dL — ABNORMAL HIGH (ref 0.44–1.00)
Creatinine, Ser: 1.81 mg/dL — ABNORMAL HIGH (ref 0.44–1.00)
GFR, Estimated: 30 mL/min — ABNORMAL LOW (ref >60)
GFR, Estimated: 29 mL/min — ABNORMAL LOW (ref >60)
Glucose, Bld: 83 mg/dL (ref 70–99)
Glucose, Bld: 260 mg/dL — ABNORMAL HIGH (ref 70–99)
Potassium: 2.8 mmol/L — ABNORMAL LOW (ref 3.5–5.1)
Potassium: 4.1 mmol/L (ref 3.5–5.1)
Sodium: 132 mmol/L — ABNORMAL LOW (ref 135–145)
Sodium: 129 mmol/L — ABNORMAL LOW (ref 135–145)

## 2023-09-11 LAB — TYPE AND SCREEN
ABO/RH(D): A POS
Antibody Screen: NEGATIVE
Unit division: 0

## 2023-09-11 LAB — COOXEMETRY PANEL
Carboxyhemoglobin: 2.4 % — ABNORMAL HIGH (ref 0.5–1.5)
Methemoglobin: <0.7 % (ref 0.0–1.5)
O2 Saturation: 62.7 %
Total hemoglobin: 9.6 g/dL — ABNORMAL LOW (ref 12.0–16.0)

## 2023-09-11 LAB — BPAM RBC
Blood Product Expiration Date: 202411272359
ISSUE DATE / TIME: 202411241340
Unit Type and Rh: 600

## 2023-09-11 LAB — GLUCOSE, CAPILLARY
Glucose-Capillary: 80 mg/dL (ref 70–99)
Glucose-Capillary: 162 mg/dL — ABNORMAL HIGH (ref 70–99)
Glucose-Capillary: 193 mg/dL — ABNORMAL HIGH (ref 70–99)
Glucose-Capillary: 138 mg/dL — ABNORMAL HIGH (ref 70–99)

## 2023-09-11 LAB — CBC
HCT: 26.7 % — ABNORMAL LOW (ref 36.0–46.0)
Hemoglobin: 8.9 g/dL — ABNORMAL LOW (ref 12.0–15.0)
MCH: 27.2 pg (ref 26.0–34.0)
MCHC: 33.3 g/dL (ref 30.0–36.0)
MCV: 81.7 fL (ref 80.0–100.0)
Platelets: 279 10*3/uL (ref 150–400)
RBC: 3.27 MIL/uL — ABNORMAL LOW (ref 3.87–5.11)
RDW: 15.4 % (ref 11.5–15.5)
WBC: 11 10*3/uL — ABNORMAL HIGH (ref 4.0–10.5)
nRBC: 2.5 % — ABNORMAL HIGH (ref 0.0–0.2)

## 2023-09-11 MED ORDER — SORBITOL 70 % SOLN
30.0000 mL | Freq: Once | Status: AC
  Administered 2023-09-11: 30 mL via ORAL
  Filled 2023-09-11: qty 30

## 2023-09-11 MED ORDER — POTASSIUM CHLORIDE CRYS ER 20 MEQ PO TBCR
40.0000 meq | EXTENDED_RELEASE_TABLET | ORAL | Status: AC
  Administered 2023-09-11 (×2): 40 meq via ORAL
  Filled 2023-09-11 (×2): qty 2

## 2023-09-11 MED ORDER — CARVEDILOL 3.125 MG PO TABS
3.1250 mg | ORAL_TABLET | Freq: Two times a day (BID) | ORAL | Status: DC
  Filled 2023-09-11: qty 1

## 2023-09-11 MED ORDER — POTASSIUM CHLORIDE 10 MEQ/50ML IV SOLN
10.0000 meq | INTRAVENOUS | Status: AC
  Administered 2023-09-11 (×5): 10 meq via INTRAVENOUS
  Filled 2023-09-11 (×4): qty 50

## 2023-09-11 MED ORDER — POTASSIUM CHLORIDE 10 MEQ/50ML IV SOLN
10.0000 meq | INTRAVENOUS | Status: DC
  Filled 2023-09-11: qty 50

## 2023-09-11 MED ORDER — GLUCERNA SHAKE PO LIQD
237.0000 mL | Freq: Three times a day (TID) | ORAL | Status: DC
  Administered 2023-09-11 – 2023-09-21 (×26): 237 mL via ORAL

## 2023-09-11 NOTE — Plan of Care (Signed)
Patient progressing slowly. OOB to chair 3x today and walked x 3 today. Ambulating further with assistance each day. + BM today. Able to wean off from Levophed today.

## 2023-09-11 NOTE — Progress Notes (Addendum)
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  11/13: AFL w/ RVR. Started on IV amio + heparin gtt. Milrinone discontinued    11/14: TEE: EF 25%, RV severely reduced, severe LAE, 3-4+ MR 11/19: RHC- RA 2, PA 45/17 (29), PCWP 11 vwaves to 18, CO/CI (thermo) 4.62/2.4, PVR 2w, PAPi 14 11/20: s/p CABG x4  (LIMA to LAD, SVG to diagonal, SVG to OM, SVG to acute marginal) + MVR by Dr. Dorris Fetch  Post-Op Day #5   On milrinone 0.25 NE 2. CO-OX 63%.  On amio drip 30 mg remains in A Fib RVR.   Scr  1.9>1.75  Walked around the unit this morning. Denies SOB. Hungry.    Objective:    Weight Range: 98.1 kg Body mass index is 37.12 kg/m.   Vital Signs:   Temp:  [98 F (36.7 C)-98.6 F (37 C)] 98 F (36.7 C) (11/24 2359) Pulse Rate:  [68-124] 113 (11/25 0645) Resp:  [15-26] 19 (11/25 0645) BP: (83-128)/(43-98) 101/72 (11/25 0645) SpO2:  [90 %-99 %] 95 % (11/25 0645) Weight:  [98.1 kg] 98.1 kg (11/25 0500) Last BM Date : 09/05/23  Weight change: Filed Weights   09/09/23 0500 09/10/23 0600 09/11/23 0500  Weight: 103 kg 101.3 kg 98.1 kg    Intake/Output:   Intake/Output Summary (Last 24 hours) at 09/11/2023 0714 Last data filed at 09/11/2023 0600 Gross per 24 hour  Intake 1834.78 ml  Output 4945 ml  Net -3110.22 ml  CVP 8-10  Physical Exam  General:  No resp difficulty. In the chair.  HEENT: normal Neck: supple. . Carotids 2+ bilat; no bruits. No lymphadenopathy or thryomegaly appreciated. Cor: PMI nondisplaced. Tachy Irregular rate & rhythm. No rubs, gallops or murmurs. Lungs: clear Abdomen: soft, nontender, nondistended. No hepatosplenomegaly. No bruits or masses. Good bowel sounds. Extremities: no cyanosis, clubbing, rash, R and LLE traced edema/ted hose  Neuro: alert & orientedx3, cranial nerves grossly intact.  moves all 4 extremities w/o difficulty. Affect pleasant GU: foley   Telemetry    A fib 100s    Labs    CBC Recent Labs    09/10/23 0319 09/11/23 0400  WBC 14.4* 11.0*  HGB 7.2* 8.9*  HCT 22.1* 26.7*  MCV 81.0 81.7  PLT 223 279   Basic Metabolic Panel Recent Labs    95/28/41 0319 09/11/23 0400  NA 132* 132*  K 3.6 2.8*  CL 93* 89*  CO2 28 31  GLUCOSE 127* 83  BUN 44* 42*  CREATININE 1.91* 1.75*  CALCIUM 7.7* 8.0*   Liver Function Tests No results for input(s): "AST", "ALT", "ALKPHOS", "BILITOT", "PROT", "ALBUMIN" in the last 72 hours.   No results for input(s): "LIPASE", "AMYLASE" in the last 72 hours. Cardiac Enzymes No results for input(s): "CKTOTAL", "CKMB", "CKMBINDEX", "TROPONINI" in the last 72 hours.  BNP: BNP (last 3 results) No results for input(s): "BNP" in the last 8760 hours.  ProBNP (last 3 results) No results for input(s): "PROBNP" in the last 8760 hours.   D-Dimer No results for input(s): "DDIMER" in the last 72 hours. Hemoglobin A1C No results for input(s): "HGBA1C" in the last 72 hours.  Fasting Lipid Panel No results for input(s): "CHOL", "HDL", "LDLCALC", "TRIG", "CHOLHDL", "LDLDIRECT" in the last 72 hours. Thyroid Function Tests No results for input(s): "TSH", "T4TOTAL", "T3FREE", "THYROIDAB" in the last  72 hours.  Invalid input(s): "FREET3"  Imaging   No results found.  Medications:    Scheduled Medications:  sodium chloride   Intravenous Once   acetaminophen  1,000 mg Oral Q6H   Or   acetaminophen (TYLENOL) oral liquid 160 mg/5 mL  1,000 mg Per Tube Q6H   aspirin EC  81 mg Oral Daily   bisacodyl  10 mg Oral Daily   Or   bisacodyl  10 mg Rectal Daily   Chlorhexidine Gluconate Cloth  6 each Topical Daily   docusate sodium  200 mg Oral Daily   enoxaparin (LOVENOX) injection  40 mg Subcutaneous QHS   ezetimibe  10 mg Oral Daily   gabapentin  100 mg Oral QHS   insulin aspart  0-20 Units Subcutaneous TID WC   insulin  aspart  0-5 Units Subcutaneous QHS   insulin aspart  10 Units Subcutaneous TID WC   insulin glargine-yfgn  60 Units Subcutaneous BID   loratadine  10 mg Oral Daily   pantoprazole  40 mg Oral Daily   potassium chloride  40 mEq Oral Q2H   rosuvastatin  40 mg Oral Daily   sodium chloride flush  3 mL Intravenous Q12H    Infusions:  albumin human Stopped (09/06/23 2310)   amiodarone 30 mg/hr (09/11/23 0600)   furosemide (LASIX) 200 mg in dextrose 5 % 100 mL (2 mg/mL) infusion 10 mg/hr (09/11/23 0600)   milrinone 0.25 mcg/kg/min (09/11/23 0600)   norepinephrine (LEVOPHED) Adult infusion 2 mcg/min (09/11/23 0600)   potassium chloride      PRN Medications: albumin human, metoprolol tartrate, midazolam, morphine injection, ondansetron (ZOFRAN) IV, mouth rinse, oxyCODONE, sodium chloride flush, traMADol  Patient Profile   77 y/o female w/ h/o CAD s/p remote LAD and RCA PCI in 2011, Type 2DM and HLD, recently diagnosed w/ new systolic heart failure and severe MR by echo. 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 AHF team consulted for HF optimization prior to CABG + MVR.  Assessment/Plan   1. Acute Systolic Heart Failure w/ Low Output  - Echo 2014 w/ normal EF 50-55%, normal RV - Echo 11/24 EF 25-30%, severe MR, RV low normal  - Ischemic CM. LHC w/ severe MVCAD.  - RHC c/w decompensated heart failure, severe LV dysfunction, and severe mitral regurgitation (RA 21, PAP 83/37/56, PCWP 36, CO 4.11, CI 2.04) - 11/19 RHC: RA 2, PA 45/17 (29), PCWP 11 vwaves to 18, CO/CI (thermo) 4.62/2.4, PVR 2w, PAPi 14 - TEE: EF 25%, RV severely reduced, severe LAE, 3-4+ MR - S/p CABG X 4 (LIMA to LAD, SVG to diagonal, SVG to OM, SVG to acute marginal) + MVR on 11/20 - Co-ox 63%  on NE 2 and milrinone 0.25  - CVP 8-10. Stop lasix drip. Supp K . Repeat CVP later this morning.  May need to intermittent IV lasix.  -Check BMET  1300.  - Try to wean NE today . If unable to wean may need to cut back milrinone to 0.125 mcg.   2. Severe MR  - appears ischemic w/ posterior leaflet tethering on echo. TEE 3-4+ MR - Now s/p MVR 11/20 at time of CABG on 11/20   3. Severe MVCAD  - Now s/p CABG X 4 (LIMA to LAD, SVG to diagonal, SVG to OM, SVG to acute marginal) on 11/20 - Aspirin + statin - No s/s angina -Continue to mobilize.   4. PAF/atrial tach  -  Remains in AFL.  - Continue IV amio  - Consider AC soon   5. Type 2DM  - Hgb A1c 7.8  - Insulin gtt per TCTS  6. AKI:  - in the setting of volume overload and diuresis - 1.06>1.32>1.16 -> 1.67 -> 1.8 -> 1.9-->1.75 - Watch with diuresis  7. Acute blood loss anemia: post procedure - Hgb up to 8.9 after unit of blood.   Length of Stay: 14  Amy Clegg, NP  09/11/2023, 7:14 AM  Advanced Heart Failure Team Pager [phone] (M-F; 7a - 5p)  Please contact [facility] for night-coverage after hours (5p -7a ) and weekends on [email or web address]  Patient seen with PA/NP, agree with the above note.   Subjective: - Feels much better today. Ambulating the hallways.    Exam: General: NAD HEENT: Normal.  Neck: Thick neck. JVP difficult to assess, no thyromegaly or thyroid nodule.  Lungs: Clear to auscultation bilaterally with normal respiratory effort. CV:  Heart regular S1/S2, no S3/S4, no murmur.  1+-2+ peripheral edema.    Abdomen: Soft, nontender, no hepatosplenomegaly, no distention.  Skin: Intact without lesions or rashes.  Neurologic: awake/alert, no gross FND.  Psych: Normal affect. Extremities: No clubbing or cyanosis.    A/P - Roughly 5L urine output yesterday w/ improvement in sCr to 1.75.  - Currently on levophed & milrinone 0.52mcg/kg/min. CVP 8-10. Will continue IV diuresis. Plan to wean levophed with MAP goal >65; will want to avoid hypotension in the setting of RV dysfunction.  - Plan on bedside TTE later today.  - Continue lasix gtt 10mg /hr.     Cassondra Stachowski Advanced Heart Failure  CRITICAL CARE Performed by: Dorthula Nettles   Total critical care time: 38 minutes  Critical care time was exclusive of separately billable procedures and treating other patients.  Critical care was necessary to treat or prevent imminent or life-threatening deterioration.  Critical care was time spent personally by me on the following activities: development of treatment plan with patient and/or surrogate as well as nursing, discussions with consultants, evaluation of patient's response to treatment, examination of patient, obtaining history from patient or surrogate, ordering and performing treatments and interventions, ordering and review of laboratory studies, ordering and review of radiographic studies, pulse oximetry and re-evaluation of patient's condition.

## 2023-09-11 NOTE — Progress Notes (Signed)
Patient ID: Diana [name], female   DOB: [birth year], 77 y.o.   MRN: [id number]  TCTS Evening Rounds:  Hemodynamically stable on milrinone 0.25 and NE 1.  Back in sinus rhythm 82 on amio IV.  UO ok. Creat up minimally from this am.  BMET    Component Value Date/Time   NA 129 (L) 09/11/2023 1429   NA 143 08/24/2023 1203   K 4.1 09/11/2023 1429   CL 89 (L) 09/11/2023 1429   CO2 29 09/11/2023 1429   GLUCOSE 260 (H) 09/11/2023 1429   BUN 44 (H) 09/11/2023 1429   BUN 16 08/24/2023 1203   CREATININE 1.81 (H) 09/11/2023 1429   CREATININE 0.77 11/17/2017 0833   CALCIUM 7.7 (L) 09/11/2023 1429   EGFR 56 (L) 08/24/2023 1203   GFRNONAA 29 (L) 09/11/2023 1429   GFRNONAA 79 11/17/2017 3295

## 2023-09-11 NOTE — Progress Notes (Signed)
Physical Therapy Treatment Patient Details Name: Diana [name] MRN: [id number] DOB: 06-04-1946 Today's Date: 09/11/2023   History of Present Illness 77 yo female admitted 11/11 for HF optimization pre CABG + MVR, R/LHC same date. 11/20 CABG x 4, MVR. PMhx: CHF, CAD, T2DM, HTN, obesity, HLD, COPD, neuropathy    PT Comments  Pt excitable for mobility. Pt progressing ambulation well, tolerating much larger distances, STS transfers require less assistance, and pt has increased awareness of sternal precautions. Pt stated she feels stronger and having less pain when moving. Pt former chest tube site increased leakage during ambulation nursing informed.   HR- 94-114 BP - 109/81 (91) pre, 108/34 (56) post   If plan is discharge home, recommend the following: A little help with bathing/dressing/bathroom;A lot of help with bathing/dressing/bathroom;Assistance with cooking/housework;Assist for transportation;Help with stairs or ramp for entrance   Can travel by private vehicle        Equipment Recommendations  None recommended by PT    Recommendations for Other Services       Precautions / Restrictions Precautions Precautions: Sternal Precaution Booklet Issued: No Restrictions Weight Bearing Restrictions: Yes RUE Weight Bearing: Weight bear through elbow only LUE Weight Bearing: Weight bear through elbow only     Mobility  Bed Mobility               General bed mobility comments: In chair on arrival, pt reported some assistance from nursing tp get up in bed    Transfers Overall transfer level: Needs assistance Equipment used: Rolling walker (2 wheels) Transfers: Sit to/from Stand Sit to Stand: Contact guard assist           General transfer comment: Pt progressing with STS following precautions, CGA-supervision on repeated trials, pt crosses hands over chest initates 3 rocks and elevates, good control on slow descent. 8x STS throughout session.     Ambulation/Gait Ambulation/Gait assistance: Contact guard assist Gait Distance (Feet): 160 Feet Assistive device: Rolling walker (2 wheels) Gait Pattern/deviations: Step-through pattern, Decreased stride length, Trunk flexed   Gait velocity interpretation: <1.8 ft/sec, indicate of risk for recurrent falls   General Gait Details: cues for posture, increased upright posture with less cueing more self aware to positioning, mild difficulty avoiding obstacles with RW, and close chair follow. Pt walked 160', 60', 120'   Stairs             Wheelchair Mobility     Tilt Bed    Modified Rankin (Stroke Patients Only)       Balance Overall balance assessment: Needs assistance   Sitting balance-Leahy Scale: Fair     Standing balance support: Bilateral upper extremity supported, During functional activity, Reliant on assistive device for balance Standing balance-Leahy Scale: Poor Standing balance comment: Rw for standing and gait                            Cognition Arousal: Alert Behavior During Therapy: WFL for tasks assessed/performed Overall Cognitive Status: Within Functional Limits for tasks assessed                                 General Comments: Good awareness of sternal precautions.        Exercises Other Exercises Other Exercises: 5x STS from recliner (no RW in front for steady)    General Comments        Pertinent Vitals/Pain Pain Assessment  Pain Assessment: No/denies pain    Home Living                          Prior Function            PT Goals (current goals can now be found in the care plan section) Acute Rehab PT Goals Potential to Achieve Goals: Good Progress towards PT goals: Progressing toward goals    Frequency    Min 1X/week      PT Plan      Co-evaluation              AM-PAC PT "6 Clicks" Mobility   Outcome Measure  Help needed turning from your back to your side while in a  flat bed without using bedrails?: A Little Help needed moving from lying on your back to sitting on the side of a flat bed without using bedrails?: A Little Help needed moving to and from a bed to a chair (including a wheelchair)?: A Little Help needed standing up from a chair using your arms (e.g., wheelchair or bedside chair)?: A Little Help needed to walk in hospital room?: A Little Help needed climbing 3-5 steps with a railing? : Total 6 Click Score: 16    End of Session Equipment Utilized During Treatment: Gait belt;Oxygen Activity Tolerance: Patient tolerated treatment well Patient left: in chair;with call bell/phone within reach;with nursing/sitter in room Nurse Communication: Mobility status PT Visit Diagnosis: Other abnormalities of gait and mobility (R26.89);Muscle weakness (generalized) (M62.81)     Time: 4332-9518 PT Time Calculation (min) (ACUTE ONLY): 39 min  Charges:    $Gait Training: 23-37 mins $Therapeutic Activity: 8-22 mins PT General Charges $$ ACUTE PT VISIT: 1 Visit                     Andrey Farmer. SPT Secure chat preferred    Darlin Drop 09/11/2023, 9:01 AM

## 2023-09-11 NOTE — Progress Notes (Signed)
    Lasix drip help this am due to hypokalemia. Restarted 12:20.   Norepi weaned off. Repeat BMEt pending.  Continue milrinone     CVP 4-5.   Quintana Canelo NP-C  2:24 PM

## 2023-09-11 NOTE — Progress Notes (Signed)
5 Days Post-Op Procedure(s) (LRB): CORONARY ARTERY BYPASS GRAFTING (CABG) TIMES FOUR USING LEFT INTERNAL MAMMARY ARTERY AND RIGHT GREATER SAPHENOUS VEIN, HARVESTED ENDOSCOPICALLY (N/A) MITRAL VALVE REPAIR USING CARPENTIER-MCCARTHY-ADAMS ANNUOPLASTY RING (N/A) TRANSESOPHAGEAL ECHOCARDIOGRAM (N/A) Subjective: Feels better.  Did better with walk, ate breakfast- appetite improved  Objective: Vital signs in last 24 hours: Temp:  [98 F (36.7 C)-98.6 F (37 C)] 98 F (36.7 C) (11/24 2359) Pulse Rate:  [68-124] 113 (11/25 0645) Cardiac Rhythm: Atrial flutter;Atrial fibrillation (11/25 0400) Resp:  [15-26] 19 (11/25 0645) BP: (83-128)/(43-98) 101/72 (11/25 0645) SpO2:  [90 %-99 %] 95 % (11/25 0645) Weight:  [98.1 kg] 98.1 kg (11/25 0500)  Hemodynamic parameters for last 24 hours:    Intake/Output from previous day: 11/24 0701 - 11/25 0700 In: 1834.8 [P.O.:360; I.V.:992.4; Blood:367.5; IV Piggyback:114.9] Out: 4945 [Urine:4875; Chest Tube:70] Intake/Output this shift: No intake/output data recorded.  General appearance: alert, cooperative, and no distress Neurologic: intact Heart: irregularly irregular rhythm Lungs: diminished breath sounds bibasilar Wound: intact. Drainage from CT site- serous  Lab Results: Recent Labs    09/10/23 0319 09/11/23 0400  WBC 14.4* 11.0*  HGB 7.2* 8.9*  HCT 22.1* 26.7*  PLT 223 279   BMET:  Recent Labs    09/10/23 0319 09/11/23 0400  NA 132* 132*  K 3.6 2.8*  CL 93* 89*  CO2 28 31  GLUCOSE 127* 83  BUN 44* 42*  CREATININE 1.91* 1.75*  CALCIUM 7.7* 8.0*    PT/INR: No results for input(s): "LABPROT", "INR" in the last 72 hours. ABG    Component Value Date/Time   PHART 7.317 (L) 09/07/2023 0108   HCO3 22.3 09/07/2023 0108   TCO2 24 09/07/2023 0108   ACIDBASEDEF 4.0 (H) 09/07/2023 0108   O2SAT 62.7 09/11/2023 0400   CBG (last 3)  Recent Labs    09/10/23 1625 09/10/23 2117 09/11/23 0621  GLUCAP 200* 126* 80     Assessment/Plan: S/P Procedure(s) (LRB): CORONARY ARTERY BYPASS GRAFTING (CABG) TIMES FOUR USING LEFT INTERNAL MAMMARY ARTERY AND RIGHT GREATER SAPHENOUS VEIN, HARVESTED ENDOSCOPICALLY (N/A) MITRAL VALVE REPAIR USING CARPENTIER-MCCARTHY-ADAMS ANNUOPLASTY RING (N/A) TRANSESOPHAGEAL ECHOCARDIOGRAM (N/A) Overall making good progress NEURO- intact CV- in a fib/ flutter with rate in low 100s  On amiodarone, will add low dose Coreg  Co-ox 62 on milrinone and norepi RESP- lungs clear continue IS RENAL- creatinine down and diuresing well  Hypokalemia- K 2.8- replete IV and PO ENDO- CBG better bt still high at times GI- appetite improved Anemia improved post transfusion Deconditioning- making progress  LOS: 14 days    [name] 09/11/2023

## 2023-09-12 DIAGNOSIS — I5023 Acute on chronic systolic (congestive) heart failure: Secondary | ICD-10-CM | POA: Diagnosis not present

## 2023-09-12 LAB — CBC
HCT: 25.9 % — ABNORMAL LOW (ref 36.0–46.0)
Hemoglobin: 8.3 g/dL — ABNORMAL LOW (ref 12.0–15.0)
MCH: 26.6 pg (ref 26.0–34.0)
MCHC: 32 g/dL (ref 30.0–36.0)
MCV: 83 fL (ref 80.0–100.0)
Platelets: 318 10*3/uL (ref 150–400)
RBC: 3.12 MIL/uL — ABNORMAL LOW (ref 3.87–5.11)
RDW: 16 % — ABNORMAL HIGH (ref 11.5–15.5)
WBC: 12.3 10*3/uL — ABNORMAL HIGH (ref 4.0–10.5)
nRBC: 0.9 % — ABNORMAL HIGH (ref 0.0–0.2)

## 2023-09-12 LAB — MAGNESIUM: Magnesium: 2 mg/dL (ref 1.7–2.4)

## 2023-09-12 LAB — BASIC METABOLIC PANEL
Anion gap: 8 (ref 5–15)
Anion gap: 10 (ref 5–15)
BUN: 40 mg/dL — ABNORMAL HIGH (ref 8–23)
BUN: 37 mg/dL — ABNORMAL HIGH (ref 8–23)
CO2: 32 mmol/L (ref 22–32)
CO2: 29 mmol/L (ref 22–32)
Calcium: 8.1 mg/dL — ABNORMAL LOW (ref 8.9–10.3)
Calcium: 7.8 mg/dL — ABNORMAL LOW (ref 8.9–10.3)
Chloride: 93 mmol/L — ABNORMAL LOW (ref 98–111)
Chloride: 91 mmol/L — ABNORMAL LOW (ref 98–111)
Creatinine, Ser: 1.67 mg/dL — ABNORMAL HIGH (ref 0.44–1.00)
Creatinine, Ser: 1.66 mg/dL — ABNORMAL HIGH (ref 0.44–1.00)
GFR, Estimated: 32 mL/min — ABNORMAL LOW (ref >60)
GFR, Estimated: 32 mL/min — ABNORMAL LOW (ref >60)
Glucose, Bld: 125 mg/dL — ABNORMAL HIGH (ref 70–99)
Glucose, Bld: 161 mg/dL — ABNORMAL HIGH (ref 70–99)
Potassium: 3.7 mmol/L (ref 3.5–5.1)
Potassium: 3.5 mmol/L (ref 3.5–5.1)
Sodium: 133 mmol/L — ABNORMAL LOW (ref 135–145)
Sodium: 130 mmol/L — ABNORMAL LOW (ref 135–145)

## 2023-09-12 LAB — COOXEMETRY PANEL
Carboxyhemoglobin: 0.7 % (ref 0.5–1.5)
Methemoglobin: <0.7 % (ref 0.0–1.5)
O2 Saturation: 55.1 %
Total hemoglobin: 9 g/dL — ABNORMAL LOW (ref 12.0–16.0)

## 2023-09-12 LAB — GLUCOSE, CAPILLARY
Glucose-Capillary: 109 mg/dL — ABNORMAL HIGH (ref 70–99)
Glucose-Capillary: 123 mg/dL — ABNORMAL HIGH (ref 70–99)
Glucose-Capillary: 135 mg/dL — ABNORMAL HIGH (ref 70–99)
Glucose-Capillary: 162 mg/dL — ABNORMAL HIGH (ref 70–99)

## 2023-09-12 MED ORDER — POTASSIUM CHLORIDE CRYS ER 20 MEQ PO TBCR: 40.0000 meq | EXTENDED_RELEASE_TABLET | Freq: Once | ORAL | Status: DC

## 2023-09-12 MED ORDER — POTASSIUM CHLORIDE CRYS ER 20 MEQ PO TBCR
40.0000 meq | EXTENDED_RELEASE_TABLET | ORAL | Status: AC
  Administered 2023-09-12 (×2): 40 meq via ORAL
  Filled 2023-09-12 (×2): qty 2

## 2023-09-12 MED ORDER — POTASSIUM CHLORIDE CRYS ER 20 MEQ PO TBCR
20.0000 meq | EXTENDED_RELEASE_TABLET | ORAL | Status: AC
Start: 2023-09-12 — End: 2023-09-12
  Administered 2023-09-12 (×3): 20 meq via ORAL
  Filled 2023-09-12: qty 1

## 2023-09-12 MED ORDER — AMIODARONE HCL 200 MG PO TABS
400.0000 mg | ORAL_TABLET | Freq: Two times a day (BID) | ORAL | Status: DC
  Administered 2023-09-12 – 2023-09-19 (×15): 400 mg via ORAL
  Filled 2023-09-12 (×15): qty 2

## 2023-09-12 NOTE — Plan of Care (Signed)
Patient showing positive progression. Epicardial pacing wires discontinued. Pressure injury, in sacral area additional to deep tissue pressure injury discovered, WOC consulted. Ambulated with physical therapy including steps. Worked with occupational therapy on daily tasks to cope with tremor.  Marletta Lor RN 09/12/2023

## 2023-09-12 NOTE — Progress Notes (Signed)
EVENING ROUNDS NOTE :     [address]       [place] [address]             [phone]                 6 Days Post-Op Procedure(s) (LRB): CORONARY ARTERY BYPASS GRAFTING (CABG) TIMES FOUR USING LEFT INTERNAL MAMMARY ARTERY AND RIGHT GREATER SAPHENOUS VEIN, HARVESTED ENDOSCOPICALLY (N/A) MITRAL VALVE REPAIR USING CARPENTIER-MCCARTHY-ADAMS ANNUOPLASTY RING (N/A) TRANSESOPHAGEAL ECHOCARDIOGRAM (N/A)   Total Length of Stay:  LOS: 15 days  Events:   No events Good uop today     BP (!) 112/46   Pulse 78   Temp 98.1 F (36.7 C) (Oral)   Resp 19   Ht 5\' 4"  (1.626 m)   Wt 99.4 kg   SpO2 94%   BMI 37.61 kg/m   CVP:  [5 mmHg] 5 mmHg      albumin human Stopped (09/06/23 2310)   furosemide (LASIX) 200 mg in dextrose 5 % 100 mL (2 mg/mL) infusion 10 mg/hr (09/12/23 1644)   milrinone 0.125 mcg/kg/min (09/12/23 1500)   norepinephrine (LEVOPHED) Adult infusion Stopped (09/11/23 1421)    I/O last 3 completed shifts: In: 1735.8 [P.O.:357; I.V.:1134.7; IV Piggyback:244] Out: 5350 [Urine:5350]      Latest Ref Rng & Units 09/12/2023    4:45 AM 09/11/2023    4:00 AM 09/10/2023    3:19 AM  CBC  WBC 4.0 - 10.5 K/uL 12.3  11.0  14.4   Hemoglobin 12.0 - 15.0 g/dL 8.3  8.9  7.2   Hematocrit 36.0 - 46.0 % 25.9  26.7  22.1   Platelets 150 - 400 K/uL 318  279  223        Latest Ref Rng & Units 09/12/2023    1:13 PM 09/12/2023    4:45 AM 09/11/2023    2:29 PM  BMP  Glucose 70 - 99 mg/dL 295  188  416   BUN 8 - 23 mg/dL 37  40  44   Creatinine 0.44 - 1.00 mg/dL 6.06  3.01  6.01   Sodium 135 - 145 mmol/L 130  133  129   Potassium 3.5 - 5.1 mmol/L 3.5  3.7  4.1   Chloride 98 - 111 mmol/L 91  93  89   CO2 22 - 32 mmol/L 29  32  29   Calcium 8.9 - 10.3 mg/dL 7.8  8.1  7.7     ABG    Component Value Date/Time   PHART 7.317 (L) 09/07/2023 0108   PCO2ART 43.5 09/07/2023 0108   PO2ART 79 (L) 09/07/2023 0108   HCO3 22.3 09/07/2023 0108   TCO2 24  09/07/2023 0108   ACIDBASEDEF 4.0 (H) 09/07/2023 0108   O2SAT 55.1 09/12/2023 0445       Brynda Greathouse, MD 09/12/2023 5:14 PM

## 2023-09-12 NOTE — Progress Notes (Signed)
Occupational Therapy Treatment Patient Details Name: Diana [name] MRN: [id number] DOB: 03/25/1946 Today's Date: 09/12/2023   History of present illness 77 yo female admitted 11/11 for HF optimization pre CABG + MVR, R/LHC same date. 11/20 CABG x 4, MVR. PMhx: CHF, CAD, T2DM, HTN, obesity, HLD, COPD, neuropathy   OT comments  Focus session on UB Adl, toileting, and continued education regarding precautions. Daughter present for session and supportive, even assisting with bed mobility this session and OT educating to optimize safety/body mechanics. Pt educated regarding compensatory techniques for UB dressing and toileting. Pt needing cues for technique. UE tremor remains present; provided weighted utensil and education; much improved. Will continue to follow. Recommending HHOT at discharge.      If plan is discharge home, recommend the following:  A little help with walking and/or transfers;A lot of help with bathing/dressing/bathroom;Assistance with cooking/housework;Assist for transportation;Help with stairs or ramp for entrance   Equipment Recommendations  None recommended by OT    Recommendations for Other Services      Precautions / Restrictions Precautions Precautions: Sternal Precaution Booklet Issued: No Restrictions Weight Bearing Restrictions: Yes (sternal precaitions) RUE Weight Bearing: Non weight bearing LUE Weight Bearing: Non weight bearing       Mobility Bed Mobility Overal bed mobility: Needs Assistance Bed Mobility: Sit to Sidelying, Rolling Rolling: Mod assist       Sit to sidelying: Mod assist General bed mobility comments: assist with BLE into bed and then with rolling. Educated regaridng optimal technique with maintaining precautiuons    Transfers Overall transfer level: Needs assistance Equipment used: Rolling walker (2 wheels) Transfers: Sit to/from Stand Sit to Stand: Contact guard assist           General transfer comment: for safety      Balance Overall balance assessment: Needs assistance   Sitting balance-Leahy Scale: Fair     Standing balance support: Bilateral upper extremity supported, During functional activity, Reliant on assistive device for balance Standing balance-Leahy Scale: Poor Standing balance comment: Rw for standing and gait, static stance between STS forward sway on standing self corrected no LOB                           ADL either performed or assessed with clinical judgement   ADL Overall ADL's : Needs assistance/impaired Eating/Feeding: Set up;Sitting;Bed level Eating/Feeding Details (indicate cue type and reason): Pt self feeding with up to min A initially and encouraged pt to be intentional with every bite and treat as a goal directed action rather than automatic and improving but pt continues to need up to min A to avoid spillage. Brought weighted spoon and progressing to set-up A Grooming: Set up;Bed level;Oral care                   Toilet Transfer: Contact guard assist;Ambulation;Rolling walker (2 wheels);BSC/3in1   Toileting- Clothing Manipulation and Hygiene: Contact guard assist;Sit to/from stand Toileting - Clothing Manipulation Details (indicate cue type and reason): for safety with pericare; educated and pt performing within precautions     Functional mobility during ADLs: Rolling walker (2 wheels);Contact guard assist      Extremity/Trunk Assessment Upper Extremity Assessment Upper Extremity Assessment: Generalized weakness (BUE tremor)   Lower Extremity Assessment Lower Extremity Assessment: Defer to PT evaluation        Vision   Vision Assessment?: No apparent visual deficits   Perception Perception Perception: Not tested   Praxis Praxis Praxis: Not  tested    Cognition Arousal: Alert Behavior During Therapy: WFL for tasks assessed/performed Overall Cognitive Status: Within Functional Limits for tasks assessed                                  General Comments: functional for ADL but some difficulty with multitasking and executive function. Good carryover of previously learned information        Exercises      Shoulder Instructions       General Comments VSS    Pertinent Vitals/ Pain       Pain Assessment Pain Assessment: No/denies pain  Home Living                                          Prior Functioning/Environment              Frequency  Min 1X/week        Progress Toward Goals  OT Goals(current goals can now be found in the care plan section)  Progress towards OT goals: Progressing toward goals  Acute Rehab OT Goals Patient Stated Goal: go home OT Goal Formulation: With patient Time For Goal Achievement: 09/24/23 Potential to Achieve Goals: Good ADL Goals Pt Will Perform Eating: with modified independence;sitting Pt Will Perform Grooming: with contact guard assist;standing Pt Will Perform Upper Body Dressing: with modified independence;sitting Pt Will Perform Lower Body Dressing: with supervision;sit to/from stand Pt Will Transfer to Toilet: ambulating;with supervision Pt Will Perform Tub/Shower Transfer: with min assist;Tub transfer;shower seat;ambulating Pt/caregiver will Perform Home Exercise Program: Both right and left upper extremity;With written HEP provided  Plan      Co-evaluation                 AM-PAC OT "6 Clicks" Daily Activity     Outcome Measure   Help from another person eating meals?: A Little Help from another person taking care of personal grooming?: A Little Help from another person toileting, which includes using toliet, bedpan, or urinal?: A Little Help from another person bathing (including washing, rinsing, drying)?: A Lot Help from another person to put on and taking off regular upper body clothing?: A Lot Help from another person to put on and taking off regular lower body clothing?: A Lot 6 Click Score: 15    End  of Session Equipment Utilized During Treatment: Gait belt;Rolling walker (2 wheels)  OT Visit Diagnosis: Unsteadiness on feet (R26.81);Muscle weakness (generalized) (M62.81);Other (comment)   Activity Tolerance Patient tolerated treatment well   Patient Left in bed;with call bell/phone within reach;with bed alarm set;with nursing/sitter in room;with family/visitor present   Nurse Communication Mobility status        Time: 8295-6213 OT Time Calculation (min): 35 min  Charges: OT General Charges $OT Visit: 1 Visit OT Treatments $Self Care/Home Management : 23-37 mins  Tyler Deis, OTR/L [facility] Acute Rehabilitation Office: [phone]   Myrla Halsted 09/12/2023, 3:16 PM

## 2023-09-12 NOTE — Consult Note (Addendum)
WOC Nurse Consult Note: Reason for Consult: Requested to assess a DPI on buttlocks. Wound type: 2 wounds in the mid line stage 3, and 1 deep pressure tissue injury. Pressure Injury POA: No Measurement: 1st stage 3 - 0.5X0.5X.1 Second stage 3 - 05X0.5X.1 Total 1X1cm  DPI right buttlock - 2X1.5X.1 Wound bed: Stage 3 - both with 80% yellow, 20% red DPI - dark brown skin. Periwound intact. Dressing procedure/placement/frequency: Applied Xeroform on the wounds stage 3 and the deep tissue injury. Change Daily. Cover with Foam dressing, change every 3 days.  Our team will assess weekly.  Please reconsult if further assistance is needed. Thank-you,  Denyse Amass BSN, RN, [employer], WOC  (Pager: [phone])

## 2023-09-12 NOTE — Progress Notes (Signed)
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  11/13: AFL w/ RVR. Started on IV amio + heparin gtt. Milrinone discontinued    11/14: TEE: EF 25%, RV severely reduced, severe LAE, 3-4+ MR 11/19: RHC- RA 2, PA 45/17 (29), PCWP 11 vwaves to 18, CO/CI (thermo) 4.62/2.4, PVR 2w, PAPi 14 11/20: s/p CABG x4  (LIMA to LAD, SVG to diagonal, SVG to OM, SVG to acute marginal) + MVR by Dr. Dorris Fetch  Post-Op Day #6  On milrinone 0.25 . CO-OX 55%  On amio drip 30 mg. In SR today. ib RVR.   Scr  1.9>1.75>1.67    Feels better today. Walked around the unit. Denies SOB.    Objective:    Weight Range: 99.4 kg Body mass index is 37.61 kg/m.   Vital Signs:   Temp:  [97.8 F (36.6 C)-98.5 F (36.9 C)] 98 F (36.7 C) (11/26 0349) Pulse Rate:  [69-144] 72 (11/26 0500) Resp:  [15-34] 18 (11/26 0500) BP: (80-126)/(34-88) 109/47 (11/26 0500) SpO2:  [86 %-98 %] 94 % (11/26 0500) Weight:  [99.4 kg] 99.4 kg (11/26 0200) Last BM Date : 09/05/23  Weight change: Filed Weights   09/10/23 0600 09/11/23 0500 09/12/23 0200  Weight: 101.3 kg 98.1 kg 99.4 kg    Intake/Output:   Intake/Output Summary (Last 24 hours) at 09/12/2023 0732 Last data filed at 09/12/2023 0500 Gross per 24 hour  Intake 1233.67 ml  Output 2425 ml  Net -1191.33 ml  CVP 4-5 in the chair.  Physical Exam  General:  Well appearing. No resp difficulty. In the chair.  HEENT: normal Neck: supple. no JVD. Carotids 2+ bilat; no bruits. No lymphadenopathy or thryomegaly appreciated. Cor: PMI nondisplaced. Regular rate & rhythm. No rubs, gallops or murmurs. Lungs: Decreased /crackles LLL RLL on 2 liters [place].  Abdomen: soft, nontender, nondistended. No hepatosplenomegaly. No bruits or masses. Good bowel sounds. Extremities: no cyanosis, clubbing, rash, R and LLE ted hose 1  + edema Neuro: alert & orientedx3, cranial nerves grossly intact. moves all 4 extremities w/o difficulty. Affect pleasant   Telemetry   SR 70-80s    Labs    CBC Recent Labs    09/11/23 0400 09/12/23 0445  WBC 11.0* 12.3*  HGB 8.9* 8.3*  HCT 26.7* 25.9*  MCV 81.7 83.0  PLT 279 318   Basic Metabolic Panel Recent Labs    95/28/41 1429 09/12/23 0445  NA 129* 133*  K 4.1 3.7  CL 89* 93*  CO2 29 32  GLUCOSE 260* 125*  BUN 44* 40*  CREATININE 1.81* 1.67*  CALCIUM 7.7* 8.1*  MG  --  2.0   Liver Function Tests No results for input(s): "AST", "ALT", "ALKPHOS", "BILITOT", "PROT", "ALBUMIN" in the last 72 hours.   No results for input(s): "LIPASE", "AMYLASE" in the last 72 hours. Cardiac Enzymes No results for input(s): "CKTOTAL", "CKMB", "CKMBINDEX", "TROPONINI" in the last 72 hours.  BNP: BNP (last 3 results) No results for input(s): "BNP" in the last 8760 hours.  ProBNP (last 3 results) No results for input(s): "PROBNP" in the last 8760 hours.   D-Dimer No results for input(s): "DDIMER" in the last 72 hours. Hemoglobin A1C No results for input(s): "HGBA1C" in the last 72 hours.  Fasting Lipid Panel No results for input(s): "CHOL", "HDL", "LDLCALC", "TRIG", "CHOLHDL", "LDLDIRECT" in the last  72 hours. Thyroid Function Tests No results for input(s): "TSH", "T4TOTAL", "T3FREE", "THYROIDAB" in the last 72 hours.  Invalid input(s): "FREET3"  Imaging   No results found.  Medications:    Scheduled Medications:  sodium chloride   Intravenous Once   aspirin EC  81 mg Oral Daily   bisacodyl  10 mg Oral Daily   Or   bisacodyl  10 mg Rectal Daily   Chlorhexidine Gluconate Cloth  6 each Topical Daily   docusate sodium  200 mg Oral Daily   enoxaparin (LOVENOX) injection  40 mg Subcutaneous QHS   ezetimibe  10 mg Oral Daily   feeding supplement (GLUCERNA SHAKE)  237 mL Oral TID BM   gabapentin  100 mg Oral QHS   insulin aspart  0-20 Units Subcutaneous TID WC    insulin aspart  0-5 Units Subcutaneous QHS   insulin aspart  10 Units Subcutaneous TID WC   insulin glargine-yfgn  60 Units Subcutaneous BID   loratadine  10 mg Oral Daily   pantoprazole  40 mg Oral Daily   potassium chloride  20 mEq Oral Q4H   rosuvastatin  40 mg Oral Daily   sodium chloride flush  3 mL Intravenous Q12H    Infusions:  albumin human Stopped (09/06/23 2310)   amiodarone 30 mg/hr (09/12/23 0500)   furosemide (LASIX) 200 mg in dextrose 5 % 100 mL (2 mg/mL) infusion 10 mg/hr (09/12/23 0500)   milrinone 0.25 mcg/kg/min (09/12/23 0500)   norepinephrine (LEVOPHED) Adult infusion Stopped (09/11/23 1421)    PRN Medications: albumin human, midazolam, morphine injection, ondansetron (ZOFRAN) IV, mouth rinse, oxyCODONE, sodium chloride flush, traMADol  Patient Profile   77 y/o female w/ h/o CAD s/p remote LAD and RCA PCI in 2011, Type 2DM and HLD, recently diagnosed w/ new systolic heart failure and severe MR by echo. 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 AHF team consulted for HF optimization prior to CABG + MVR.  Assessment/Plan   1. Acute Systolic Heart Failure w/ Low Output  - Echo 2014 w/ normal EF 50-55%, normal RV - Echo 11/24 EF 25-30%, severe MR, RV low normal  - Ischemic CM. LHC w/ severe MVCAD.  - RHC c/w decompensated heart failure, severe LV dysfunction, and severe mitral regurgitation (RA 21, PAP 83/37/56, PCWP 36, CO 4.11, CI 2.04) - 11/19 RHC: RA 2, PA 45/17 (29), PCWP 11 vwaves to 18, CO/CI (thermo) 4.62/2.4, PVR 2w, PAPi 14 - TEE: EF 25%, RV severely reduced, severe LAE, 3-4+ MR - S/p CABG X 4 (LIMA to LAD, SVG to diagonal, SVG to OM, SVG to acute marginal) + MVR on 11/20 - Co-ox 55% on Milrinone 0.25 mcg. - CVP 4-5. Continue lasix drip today. Anticipate switching to torsemide.  - No room for GDMT   2. Severe MR  - appears ischemic w/ posterior leaflet tethering  on echo. TEE 3-4+ MR - Now s/p MVR 11/20 at time of CABG on 11/20   3. Severe MVCAD  - Now s/p CABG X 4 (LIMA to LAD, SVG to diagonal, SVG to OM, SVG to acute marginal) on 11/20 - Aspirin + statin - No s/s angina -Continue to mobilize.   4. PAF/atrial tach  - In SR today. .  - Continue IV amio while on milrinone.  - Consider AC soon   5. Type 2DM  - Hgb A1c 7.8  - Insulin gtt per TCTS  6. AKI:  - in  the setting of volume overload and diuresis - 1.06>1.32>1.16 -> 1.67 -> 1.8 -> 1.9-->1.75>1.67  - Watch with diuresis  7. Acute blood loss anemia: post procedure - Hgb 8.8>8.3 today. No obvious source.    Continue to mobilize. Needs to use IS every hour. Check BMET this afternoon.   Length of Stay: 15  Presley Gora, NP  09/12/2023, 7:32 AM  Advanced Heart Failure Team Pager [phone] (M-F; 7a - 5p)  Please contact [facility] for night-coverage after hours (5p -7a ) and weekends on [email or web address]

## 2023-09-12 NOTE — Progress Notes (Signed)
6 Days Post-Op Procedure(s) (LRB): CORONARY ARTERY BYPASS GRAFTING (CABG) TIMES FOUR USING LEFT INTERNAL MAMMARY ARTERY AND RIGHT GREATER SAPHENOUS VEIN, HARVESTED ENDOSCOPICALLY (N/A) MITRAL VALVE REPAIR USING CARPENTIER-MCCARTHY-ADAMS ANNUOPLASTY RING (N/A) TRANSESOPHAGEAL ECHOCARDIOGRAM (N/A) Subjective: No complaints this AM  Objective: Vital signs in last 24 hours: Temp:  [97.8 F (36.6 C)-98.5 F (36.9 C)] 98 F (36.7 C) (11/26 0349) Pulse Rate:  [69-144] 84 (11/26 0700) Cardiac Rhythm: Atrial fibrillation (11/25 2000) Resp:  [15-34] 19 (11/26 0700) BP: (80-126)/(34-88) 110/45 (11/26 0700) SpO2:  [86 %-98 %] 90 % (11/26 0700) Weight:  [99.4 kg] 99.4 kg (11/26 0200)  Hemodynamic parameters for last 24 hours: CVP:  [8 mmHg] 8 mmHg  Intake/Output from previous day: 11/25 0701 - 11/26 0700 In: 1292.3 [P.O.:357; I.V.:691.3; IV Piggyback:244] Out: 2425 [Urine:2425] Intake/Output this shift: No intake/output data recorded.  General appearance: alert, cooperative, and no distress Neurologic: intact Heart: regular rate and rhythm Lungs: diminished breath sounds bibasilar Extremities: edema + Wound: clean and dry  Lab Results: Recent Labs    09/11/23 0400 09/12/23 0445  WBC 11.0* 12.3*  HGB 8.9* 8.3*  HCT 26.7* 25.9*  PLT 279 318   BMET:  Recent Labs    09/11/23 1429 09/12/23 0445  NA 129* 133*  K 4.1 3.7  CL 89* 93*  CO2 29 32  GLUCOSE 260* 125*  BUN 44* 40*  CREATININE 1.81* 1.67*  CALCIUM 7.7* 8.1*    PT/INR: No results for input(s): "LABPROT", "INR" in the last 72 hours. ABG    Component Value Date/Time   PHART 7.317 (L) 09/07/2023 0108   HCO3 22.3 09/07/2023 0108   TCO2 24 09/07/2023 0108   ACIDBASEDEF 4.0 (H) 09/07/2023 0108   O2SAT 55.1 09/12/2023 0445   CBG (last 3)  Recent Labs    09/11/23 1638 09/11/23 2056 09/12/23 0608  GLUCAP 193* 138* 109*    Assessment/Plan: S/P Procedure(s) (LRB): CORONARY ARTERY BYPASS GRAFTING (CABG)  TIMES FOUR USING LEFT INTERNAL MAMMARY ARTERY AND RIGHT GREATER SAPHENOUS VEIN, HARVESTED ENDOSCOPICALLY (N/A) MITRAL VALVE REPAIR USING CARPENTIER-MCCARTHY-ADAMS ANNUOPLASTY RING (N/A) TRANSESOPHAGEAL ECHOCARDIOGRAM (N/A) POD # 6 NEURO- intact CV- On amiodarone drip- will convert to PO  Co-ox 55 on milrinone 0.25  Will dc pacing wires  Start Eliquis tomorrow RESP- continue IS RENAL_ creatinine down to 1.67  Continues to diurese ENDO- CBG better but still elevated Gi- tolerating diet,. + BM yesterday Deconditioning- continue to mobilize Anemia- stable   LOS: 15 days    [name] 09/12/2023

## 2023-09-12 NOTE — Progress Notes (Signed)
Physical Therapy Treatment Patient Details Name: Diana [name] MRN: [id number] DOB: 22-Feb-1946 Today's Date: 09/12/2023   History of Present Illness 77 yo female admitted 11/11 for HF optimization pre CABG + MVR, R/LHC same date. 11/20 CABG x 4, MVR. PMhx: CHF, CAD, T2DM, HTN, obesity, HLD, COPD, neuropathy    PT Comments  Pt pleasant and agreeable to mobility. Pt in bed on arrival, bed mobility modA, requires cueing to and modA to initiate roll, elevate trunk and swing legs OOB. Pt ambulation improving, still CGA for lines and safety, pt ambulating further with nurses and was still fatigued so went shorter distance during session, showing improved upright posture and RW control. Pt tolerated introduction to stairs, pts daughter showed photo of hoem stairs, they are shallower 4in stairs, pt completed 8in stairs minA will likely tolerate home stairs. Pt only reaching 250cc on IS, difficultly maintaining inhale, encouraged by therapist to continue daily.     If plan is discharge home, recommend the following: A little help with bathing/dressing/bathroom;A lot of help with bathing/dressing/bathroom;Assistance with cooking/housework;Assist for transportation;Help with stairs or ramp for entrance   Can travel by private vehicle        Equipment Recommendations  None recommended by PT    Recommendations for Other Services       Precautions / Restrictions Precautions Precautions: Sternal Precaution Booklet Issued: No Restrictions Weight Bearing Restrictions: Yes RUE Weight Bearing: Non weight bearing LUE Weight Bearing: Non weight bearing     Mobility  Bed Mobility Overal bed mobility: Needs Assistance Bed Mobility: Rolling, Sidelying to Sit Rolling: Mod assist Sidelying to sit: Mod assist       General bed mobility comments: difficuly with roling and raising trunk, mod cueing to use LLE to help roll R, modA to facilitate legs OOB and elevate trunk    Transfers Overall  transfer level: Needs assistance Equipment used: Rolling walker (2 wheels) Transfers: Sit to/from Stand Sit to Stand: Supervision           General transfer comment: pt consistantly stands without supervision for line management, 1xOOB, 11x from recliner, min cueing for hands in lab on sitting and controling descent    Ambulation/Gait Ambulation/Gait assistance: Contact guard assist Gait Distance (Feet): 120 Feet Assistive device: Rolling walker (2 wheels) Gait Pattern/deviations: Step-through pattern, Decreased stride length, Trunk flexed   Gait velocity interpretation: <1.8 ft/sec, indicate of risk for recurrent falls   General Gait Details: pt doing well with maintaining upright trunk during ambulation, more aware with RW, was shorter in distance during session due to long walk earlier wih nursing   Stairs Stairs: Yes Stairs assistance: Min assist Stair Management: One rail Right, One rail Left, Step to pattern Number of Stairs: 2 General stair comments: step too pattern, ascending stairs pt stable and able to lift LE to clear and steps up well, descending pt has difficulty seeing where she is steppng and has a very slow mildly unsteady lowering with heel touching back of step.   Wheelchair Mobility     Tilt Bed    Modified Rankin (Stroke Patients Only)       Balance Overall balance assessment: Needs assistance   Sitting balance-Leahy Scale: Fair     Standing balance support: Bilateral upper extremity supported, During functional activity, Reliant on assistive device for balance Standing balance-Leahy Scale: Poor Standing balance comment: Rw for standing and gait, static stance between STS forward sway on standing self corrected no LOB  Cognition Arousal: Alert Behavior During Therapy: WFL for tasks assessed/performed Overall Cognitive Status: Within Functional Limits for tasks assessed                                  General Comments: Good awareness of sternal precautions.        Exercises Other Exercises Other Exercises: 10x STS from recliner (no RW)    General Comments        Pertinent Vitals/Pain Pain Assessment Pain Assessment: No/denies pain    Home Living                          Prior Function            PT Goals (current goals can now be found in the care plan section) Progress towards PT goals: Progressing toward goals    Frequency    Min 1X/week      PT Plan      Co-evaluation              AM-PAC PT "6 Clicks" Mobility   Outcome Measure  Help needed turning from your back to your side while in a flat bed without using bedrails?: A Little Help needed moving from lying on your back to sitting on the side of a flat bed without using bedrails?: A Little Help needed moving to and from a bed to a chair (including a wheelchair)?: A Little Help needed standing up from a chair using your arms (e.g., wheelchair or bedside chair)?: A Little Help needed to walk in hospital room?: A Little Help needed climbing 3-5 steps with a railing? : A Lot 6 Click Score: 17    End of Session Equipment Utilized During Treatment: Gait belt;Oxygen Activity Tolerance: Patient tolerated treatment well Patient left: in chair;with call bell/phone within reach;with family/visitor present Nurse Communication: Mobility status PT Visit Diagnosis: Other abnormalities of gait and mobility (R26.89);Muscle weakness (generalized) (M62.81)     Time: 6295-2841 PT Time Calculation (min) (ACUTE ONLY): 39 min  Charges:    $Gait Training: 23-37 mins $Therapeutic Activity: 8-22 mins PT General Charges $$ ACUTE PT VISIT: 1 Visit                     Andrey Farmer. SPT Secure chat preferred    Darlin Drop 09/12/2023, 2:04 PM

## 2023-09-12 NOTE — Progress Notes (Signed)
PT Cancellation Note  Patient Details Name: Diana [name] MRN: [id number] DOB: May 03, 1946   Cancelled Treatment:    Reason Eval/Treat Not Completed: Other (comment) (pt just returned to bed and currently fatigued. Will plan to reattempt)   Jonie Burdell B Toriano Aikey 09/12/2023, 9:26 AM Merryl Hacker, PT Acute Rehabilitation Services Office: [phone]

## 2023-09-13 ENCOUNTER — Inpatient Hospital Stay (HOSPITAL_COMMUNITY): Payer: Medicare HMO

## 2023-09-13 DIAGNOSIS — I5023 Acute on chronic systolic (congestive) heart failure: Secondary | ICD-10-CM | POA: Diagnosis not present

## 2023-09-13 LAB — BASIC METABOLIC PANEL
Anion gap: 9 (ref 5–15)
BUN: 32 mg/dL — ABNORMAL HIGH (ref 8–23)
CO2: 32 mmol/L (ref 22–32)
Calcium: 7.9 mg/dL — ABNORMAL LOW (ref 8.9–10.3)
Chloride: 93 mmol/L — ABNORMAL LOW (ref 98–111)
Creatinine, Ser: 1.46 mg/dL — ABNORMAL HIGH (ref 0.44–1.00)
GFR, Estimated: 37 mL/min — ABNORMAL LOW (ref >60)
Glucose, Bld: 128 mg/dL — ABNORMAL HIGH (ref 70–99)
Potassium: 3.6 mmol/L (ref 3.5–5.1)
Sodium: 134 mmol/L — ABNORMAL LOW (ref 135–145)

## 2023-09-13 LAB — CBC
HCT: 25.9 % — ABNORMAL LOW (ref 36.0–46.0)
Hemoglobin: 8.2 g/dL — ABNORMAL LOW (ref 12.0–15.0)
MCH: 26.5 pg (ref 26.0–34.0)
MCHC: 31.7 g/dL (ref 30.0–36.0)
MCV: 83.8 fL (ref 80.0–100.0)
Platelets: 359 10*3/uL (ref 150–400)
RBC: 3.09 MIL/uL — ABNORMAL LOW (ref 3.87–5.11)
RDW: 16.8 % — ABNORMAL HIGH (ref 11.5–15.5)
WBC: 9.9 10*3/uL (ref 4.0–10.5)
nRBC: 0.5 % — ABNORMAL HIGH (ref 0.0–0.2)

## 2023-09-13 LAB — GLUCOSE, CAPILLARY
Glucose-Capillary: 98 mg/dL (ref 70–99)
Glucose-Capillary: 203 mg/dL — ABNORMAL HIGH (ref 70–99)
Glucose-Capillary: 122 mg/dL — ABNORMAL HIGH (ref 70–99)
Glucose-Capillary: 161 mg/dL — ABNORMAL HIGH (ref 70–99)

## 2023-09-13 LAB — COOXEMETRY PANEL
Carboxyhemoglobin: 2.4 % — ABNORMAL HIGH (ref 0.5–1.5)
Methemoglobin: <0.7 % (ref 0.0–1.5)
O2 Saturation: 60.8 %
Total hemoglobin: 8.6 g/dL — ABNORMAL LOW (ref 12.0–16.0)

## 2023-09-13 LAB — MAGNESIUM: Magnesium: 2 mg/dL (ref 1.7–2.4)

## 2023-09-13 MED ORDER — POLYETHYLENE GLYCOL 3350 17 G PO PACK
17.0000 g | PACK | Freq: Every day | ORAL | Status: DC
  Administered 2023-09-13 – 2023-09-20 (×4): 17 g via ORAL
  Filled 2023-09-13 (×6): qty 1

## 2023-09-13 MED ORDER — POTASSIUM CHLORIDE CRYS ER 20 MEQ PO TBCR
40.0000 meq | EXTENDED_RELEASE_TABLET | ORAL | Status: AC
  Administered 2023-09-13 (×3): 40 meq via ORAL
  Filled 2023-09-13 (×3): qty 2

## 2023-09-13 MED ORDER — POTASSIUM CHLORIDE CRYS ER 20 MEQ PO TBCR
20.0000 meq | EXTENDED_RELEASE_TABLET | ORAL | Status: DC
  Administered 2023-09-13: 20 meq via ORAL

## 2023-09-13 MED ORDER — ACETAMINOPHEN 325 MG PO TABS
650.0000 mg | ORAL_TABLET | Freq: Four times a day (QID) | ORAL | Status: DC | PRN
  Administered 2023-09-13 – 2023-09-21 (×11): 650 mg via ORAL
  Filled 2023-09-13 (×11): qty 2

## 2023-09-13 MED ORDER — ACETAZOLAMIDE 250 MG PO TABS
250.0000 mg | ORAL_TABLET | Freq: Two times a day (BID) | ORAL | Status: AC
  Administered 2023-09-13 (×2): 250 mg via ORAL
  Filled 2023-09-13 (×2): qty 1

## 2023-09-13 MED FILL — Lidocaine HCl Local Preservative Free (PF) Inj 2%: INTRAMUSCULAR | Qty: 14 | Status: AC

## 2023-09-13 MED FILL — Heparin Sodium (Porcine) Inj 1000 Unit/ML: INTRAMUSCULAR | Qty: 10 | Status: AC

## 2023-09-13 MED FILL — Electrolyte-R (PH 7.4) Solution: INTRAVENOUS | Qty: 8000 | Status: AC

## 2023-09-13 MED FILL — Albumin, Human Inj 5%: INTRAVENOUS | Qty: 250 | Status: AC

## 2023-09-13 MED FILL — Sodium Chloride IV Soln 0.9%: INTRAVENOUS | Qty: 2000 | Status: AC

## 2023-09-13 MED FILL — Heparin Sodium (Porcine) Inj 1000 Unit/ML: INTRAMUSCULAR | Qty: 30 | Status: AC

## 2023-09-13 MED FILL — Sodium Bicarbonate IV Soln 8.4%: INTRAVENOUS | Qty: 50 | Status: AC

## 2023-09-13 MED FILL — Potassium Chloride Inj 2 mEq/ML: INTRAVENOUS | Qty: 40 | Status: AC

## 2023-09-13 MED FILL — Heparin Sodium (Porcine) Inj 1000 Unit/ML: Qty: 1000 | Status: AC

## 2023-09-13 NOTE — Progress Notes (Signed)
Thoracentesis  Procedure Note  Diana [name]  [id number]  May 19, 1946  Date:09/13/23  Time:9:10 AM   Provider Performing:Everley Evora C Katrinka Blazing   Procedure: Thoracentesis with imaging guidance (40981)  Indication(s) Pleural Effusion  Consent Risks of the procedure as well as the alternatives and risks of each were explained to the patient and/or caregiver.  Consent for the procedure was obtained and is signed in the bedside chart  Anesthesia Topical only with 1% lidocaine    Time Out Verified patient identification, verified procedure, site/side was marked, verified correct patient position, special equipment/implants available, medications/allergies/relevant history reviewed, required imaging and test results available.   Sterile Technique Maximal sterile technique including full sterile barrier drape, hand hygiene, sterile gown, sterile gloves, mask, hair covering, sterile ultrasound probe cover (if used).  Procedure Description Ultrasound was used to identify appropriate pleural anatomy for placement and overlying skin marked.  Area of drainage cleaned and draped in sterile fashion. Lidocaine was used to anesthetize the skin and subcutaneous tissue.  600 cc's of serosanguinous appearing fluid was drained from the left pleural space. Catheter then removed and bandaid applied to site.   Complications/Tolerance None; patient tolerated the procedure well. Chest X-ray is ordered to confirm no post-procedural complication.   EBL Minimal   Specimen(s) None

## 2023-09-13 NOTE — Progress Notes (Signed)
7 Days Post-Op Procedure(s) (LRB): CORONARY ARTERY BYPASS GRAFTING (CABG) TIMES FOUR USING LEFT INTERNAL MAMMARY ARTERY AND RIGHT GREATER SAPHENOUS VEIN, HARVESTED ENDOSCOPICALLY (N/A) MITRAL VALVE REPAIR USING CARPENTIER-MCCARTHY-ADAMS ANNUOPLASTY RING (N/A) TRANSESOPHAGEAL ECHOCARDIOGRAM (N/A) Subjective: In good spirits this AM, walked around entire unit for 1st time  Objective: Vital signs in last 24 hours: Temp:  [98.1 F (36.7 C)-98.5 F (36.9 C)] 98.5 F (36.9 C) (11/27 0400) Pulse Rate:  [69-88] 83 (11/27 0700) Cardiac Rhythm: Normal sinus rhythm (11/26 2000) Resp:  [3-30] 23 (11/27 0700) BP: (93-131)/(39-98) 131/53 (11/27 0700) SpO2:  [90 %-97 %] 91 % (11/27 0700) Weight:  [98.3 kg] 98.3 kg (11/27 0600)  Hemodynamic parameters for last 24 hours: CVP:  [14 mmHg-15 mmHg] 15 mmHg  Intake/Output from previous day: 11/26 0701 - 11/27 0700 In: 1354.4 [P.O.:1077; I.V.:277.4] Out: 2612 [Urine:2612] Intake/Output this shift: No intake/output data recorded.  General appearance: alert, cooperative, and no distress Neurologic: intact Heart: regular rate and rhythm Lungs: diminished breath sounds bibasilar and left . right Abdomen: normal findings: soft, non-tender Wound: clean and dry  Lab Results: Recent Labs    09/12/23 0445 09/13/23 0420  WBC 12.3* 9.9  HGB 8.3* 8.2*  HCT 25.9* 25.9*  PLT 318 359   BMET:  Recent Labs    09/12/23 1313 09/13/23 0420  NA 130* 134*  K 3.5 3.6  CL 91* 93*  CO2 29 32  GLUCOSE 161* 128*  BUN 37* 32*  CREATININE 1.66* 1.46*  CALCIUM 7.8* 7.9*    PT/INR: No results for input(s): "LABPROT", "INR" in the last 72 hours. ABG    Component Value Date/Time   PHART 7.317 (L) 09/07/2023 0108   HCO3 22.3 09/07/2023 0108   TCO2 24 09/07/2023 0108   ACIDBASEDEF 4.0 (H) 09/07/2023 0108   O2SAT 60.8 09/13/2023 0420   CBG (last 3)  Recent Labs    09/12/23 1554 09/12/23 2157 09/13/23 0647  GLUCAP 135* 162* 98     Assessment/Plan: S/P Procedure(s) (LRB): CORONARY ARTERY BYPASS GRAFTING (CABG) TIMES FOUR USING LEFT INTERNAL MAMMARY ARTERY AND RIGHT GREATER SAPHENOUS VEIN, HARVESTED ENDOSCOPICALLY (N/A) MITRAL VALVE REPAIR USING CARPENTIER-MCCARTHY-ADAMS ANNUOPLASTY RING (N/A) TRANSESOPHAGEAL ECHOCARDIOGRAM (N/A) - Looks great NEURO- intact CV- in Sr on PO amiodarone  Co-ox 61 on milrinone 0.125  Milrinone per AHF team  Start Eliquis for A fib once issue of thoracentesis settled RESP- good saturations but CXR shows probable layering effusion on left  Will ask CCM to see for possible thoracentesis RENAL- 1250 negative yesterday  Creatinine trending down, now at baseline ENDO- CBG much better controlled Gi- tolerating diet Anemia stable Enoxaparin + SCD Continue PT    LOS: 16 days    Diana [name] 09/13/2023

## 2023-09-13 NOTE — Progress Notes (Addendum)
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  11/13: AFL w/ RVR. Started on IV amio + heparin gtt. Milrinone discontinued    11/14: TEE: EF 25%, RV severely reduced, severe LAE, 3-4+ MR 11/19: RHC- RA 2, PA 45/17 (29), PCWP 11 vwaves to 18, CO/CI (thermo) 4.62/2.4, PVR 2w, PAPi 14 11/20: s/p CABG x4  (LIMA to LAD, SVG to diagonal, SVG to OM, SVG to acute marginal) + MVR by Dr. Dorris Fetch  Post-Op Day #7  On milrinone 0.125 . CO-OX 61%   Feeling stronger. Denis SOB.      Objective:    Weight Range: 98.3 kg Body mass index is 37.2 kg/m.   Vital Signs:   Temp:  [98.1 F (36.7 C)-98.5 F (36.9 C)] 98.5 F (36.9 C) (11/27 0400) Pulse Rate:  [69-88] 83 (11/27 0700) Resp:  [3-30] 23 (11/27 0700) BP: (93-131)/(39-98) 131/53 (11/27 0700) SpO2:  [90 %-97 %] 91 % (11/27 0700) Weight:  [98.3 kg] 98.3 kg (11/27 0600) Last BM Date : 09/11/23  Weight change: Filed Weights   09/11/23 0500 09/12/23 0200 09/13/23 0600  Weight: 98.1 kg 99.4 kg 98.3 kg    Intake/Output:   Intake/Output Summary (Last 24 hours) at 09/13/2023 0732 Last data filed at 09/13/2023 0600 Gross per 24 hour  Intake 1354.44 ml  Output 2612 ml  Net -1257.56 ml  CVP   Physical Exam  General:  In the chair.  No resp difficulty HEENT: normal Neck: supple. JVP 9-10 . Carotids 2+ bilat; no bruits. No lymphadenopathy or thryomegaly appreciated. RIJ  Cor: PMI nondisplaced. Regular rate & rhythm. No rubs, gallops or murmurs. Lungs: Decreased on left. On 2 liters Abdomen: soft, nontender, nondistended. No hepatosplenomegaly. No bruits or masses. Good bowel sounds. Extremities: no cyanosis, clubbing, rash, R and LLE 1+ edema Neuro: alert & orientedx3, cranial nerves grossly intact. moves all 4 extremities w/o difficulty. Affect pleasant  Telemetry    SR 80s    Labs    CBC Recent Labs    09/12/23 0445 09/13/23 0420  WBC 12.3* 9.9  HGB 8.3* 8.2*  HCT 25.9* 25.9*  MCV 83.0 83.8  PLT 318 359   Basic Metabolic Panel Recent Labs    40/98/11 0445 09/12/23 1313 09/13/23 0420  NA 133* 130* 134*  K 3.7 3.5 3.6  CL 93* 91* 93*  CO2 32 29 32  GLUCOSE 125* 161* 128*  BUN 40* 37* 32*  CREATININE 1.67* 1.66* 1.46*  CALCIUM 8.1* 7.8* 7.9*  MG 2.0  --  2.0   Liver Function Tests No results for input(s): "AST", "ALT", "ALKPHOS", "BILITOT", "PROT", "ALBUMIN" in the last 72 hours.   No results for input(s): "LIPASE", "AMYLASE" in the last 72 hours. Cardiac Enzymes No results for input(s): "CKTOTAL", "CKMB", "CKMBINDEX", "TROPONINI" in the last 72 hours.  BNP: BNP (last 3 results) No results for input(s): "BNP" in the last 8760 hours.  ProBNP (last 3 results) No results for input(s): "PROBNP" in the last 8760 hours.   D-Dimer No results for input(s): "DDIMER" in the last 72 hours. Hemoglobin A1C No results for input(s): "HGBA1C" in the last 72 hours.  Fasting Lipid Panel No results for input(s): "CHOL", "HDL", "LDLCALC", "TRIG", "CHOLHDL", "LDLDIRECT" in the last 72 hours. Thyroid Function Tests No results for input(s): "TSH", "T4TOTAL", "T3FREE", "THYROIDAB" in the last 72 hours.  Invalid input(s): "FREET3"  Imaging   No results found.  Medications:    Scheduled Medications:  sodium chloride   Intravenous Once   amiodarone  400 mg Oral BID   aspirin EC  81 mg Oral Daily   bisacodyl  10 mg Oral Daily   Or   bisacodyl  10 mg Rectal Daily   Chlorhexidine Gluconate Cloth  6 each Topical Daily   docusate sodium  200 mg Oral Daily   enoxaparin (LOVENOX) injection  40 mg Subcutaneous QHS   ezetimibe  10 mg Oral Daily   feeding supplement (GLUCERNA SHAKE)  237 mL Oral TID BM   gabapentin  100 mg Oral QHS   insulin aspart  0-20 Units Subcutaneous TID WC   insulin aspart  0-5 Units Subcutaneous QHS   insulin  aspart  10 Units Subcutaneous TID WC   insulin glargine-yfgn  60 Units Subcutaneous BID   loratadine  10 mg Oral Daily   pantoprazole  40 mg Oral Daily   potassium chloride  20 mEq Oral Q4H   rosuvastatin  40 mg Oral Daily   sodium chloride flush  3 mL Intravenous Q12H    Infusions:  albumin human Stopped (09/06/23 2310)   furosemide (LASIX) 200 mg in dextrose 5 % 100 mL (2 mg/mL) infusion 10 mg/hr (09/13/23 0600)   milrinone 0.125 mcg/kg/min (09/13/23 0600)   norepinephrine (LEVOPHED) Adult infusion Stopped (09/11/23 1421)    PRN Medications: albumin human, morphine injection, ondansetron (ZOFRAN) IV, mouth rinse, oxyCODONE, sodium chloride flush, traMADol  Patient Profile   77 y/o female w/ h/o CAD s/p remote LAD and RCA PCI in 2011, Type 2DM and HLD, recently diagnosed w/ new systolic heart failure and severe MR by echo. 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 AHF team consulted for HF optimization prior to CABG + MVR.  Assessment/Plan   1. Acute Systolic Heart Failure w/ Low Output  - Echo 2014 w/ normal EF 50-55%, normal RV - Echo 11/24 EF 25-30%, severe MR, RV low normal  - Ischemic CM. LHC w/ severe MVCAD.  - RHC c/w decompensated heart failure, severe LV dysfunction, and severe mitral regurgitation (RA 21, PAP 83/37/56, PCWP 36, CO 4.11, CI 2.04) - 11/19 RHC: RA 2, PA 45/17 (29), PCWP 11 vwaves to 18, CO/CI (thermo) 4.62/2.4, PVR 2w, PAPi 14 - TEE: EF 25%, RV severely reduced, severe LAE, 3-4+ MR - S/p CABG X 4 (LIMA to LAD, SVG to diagonal, SVG to OM, SVG to acute marginal) + MVR on 11/20 - Co-ox stable. Continue milrinone today.  - CVP 9-10.  Continue lasix drip and give 250 mg BID today.  Anticipate switching to torsemide.  - No room for GDMT  -Renal function stable.   2. Severe MR  - appears ischemic w/ posterior leaflet tethering on echo. TEE 3-4+ MR - Now s/p MVR 11/20 at  time of CABG on 11/20   3. Severe MVCAD  - Now s/p CABG X 4 (LIMA to LAD, SVG to diagonal, SVG to OM, SVG to acute marginal) on 11/20 - Aspirin + statin - No s/s angina -Continue to mobilize.   4. PAF/atrial tach  - In SR today. - Now po amiodarone.   - Consider AC soon   5. Type 2DM  - Hgb A1c 7.8  - Insulin gtt per TCTS  6. AKI:  - in the setting of volume overload and diuresis - Peaked at 1.9-->1.46  -  Watch with diuresis  7. Acute blood loss anemia: post procedure - Hgb 8.8>8.3 today. No obvious source.   8. L Pleural effusion Ask CCM to [place] and possible thoracentesis.   Discussed with Dr Dorris Fetch at that bedside.   Length of Stay: 16  Ovidio Steele, NP  09/13/2023, 7:32 AM  Advanced Heart Failure Team Pager [phone] (M-F; 7a - 5p)  Please contact [facility] for night-coverage after hours (5p -7a ) and weekends on [email or web address]

## 2023-09-13 NOTE — Progress Notes (Signed)
Patient ID: Diana [name], female   DOB: [birth year], 77 y.o.   MRN: [id number] TCTS Evening Rounds:  Hemodynamically stable in sinus rhythm. Milrinone 0.125.  Diuresing well with lasix drip.  Had left thoracentesis today removing 600 cc. Improved aeration on CXR afterwards.  Ambulating well.

## 2023-09-14 ENCOUNTER — Inpatient Hospital Stay (HOSPITAL_COMMUNITY): Payer: Medicare HMO

## 2023-09-14 DIAGNOSIS — I5023 Acute on chronic systolic (congestive) heart failure: Secondary | ICD-10-CM | POA: Diagnosis not present

## 2023-09-14 LAB — BASIC METABOLIC PANEL
Anion gap: 7 (ref 5–15)
BUN: 27 mg/dL — ABNORMAL HIGH (ref 8–23)
CO2: 31 mmol/L (ref 22–32)
Calcium: 8 mg/dL — ABNORMAL LOW (ref 8.9–10.3)
Chloride: 94 mmol/L — ABNORMAL LOW (ref 98–111)
Creatinine, Ser: 1.38 mg/dL — ABNORMAL HIGH (ref 0.44–1.00)
GFR, Estimated: 40 mL/min — ABNORMAL LOW (ref >60)
Glucose, Bld: 91 mg/dL (ref 70–99)
Potassium: 3.5 mmol/L (ref 3.5–5.1)
Sodium: 132 mmol/L — ABNORMAL LOW (ref 135–145)

## 2023-09-14 LAB — CBC
HCT: 28.8 % — ABNORMAL LOW (ref 36.0–46.0)
Hemoglobin: 8.9 g/dL — ABNORMAL LOW (ref 12.0–15.0)
MCH: 26.3 pg (ref 26.0–34.0)
MCHC: 30.9 g/dL (ref 30.0–36.0)
MCV: 85.2 fL (ref 80.0–100.0)
Platelets: 412 10*3/uL — ABNORMAL HIGH (ref 150–400)
RBC: 3.38 MIL/uL — ABNORMAL LOW (ref 3.87–5.11)
RDW: 17.2 % — ABNORMAL HIGH (ref 11.5–15.5)
WBC: 10.4 10*3/uL (ref 4.0–10.5)
nRBC: 0.2 % (ref 0.0–0.2)

## 2023-09-14 LAB — COOXEMETRY PANEL
Carboxyhemoglobin: 2.2 % — ABNORMAL HIGH (ref 0.5–1.5)
Methemoglobin: 1.3 % (ref 0.0–1.5)
O2 Saturation: 65.7 %
Total hemoglobin: 9.7 g/dL — ABNORMAL LOW (ref 12.0–16.0)

## 2023-09-14 LAB — GLUCOSE, CAPILLARY
Glucose-Capillary: 83 mg/dL (ref 70–99)
Glucose-Capillary: 178 mg/dL — ABNORMAL HIGH (ref 70–99)
Glucose-Capillary: 245 mg/dL — ABNORMAL HIGH (ref 70–99)
Glucose-Capillary: 197 mg/dL — ABNORMAL HIGH (ref 70–99)

## 2023-09-14 MED ORDER — POTASSIUM CHLORIDE CRYS ER 20 MEQ PO TBCR
20.0000 meq | EXTENDED_RELEASE_TABLET | ORAL | Status: AC
Start: 2023-09-14 — End: 2023-09-14
  Administered 2023-09-14 (×3): 20 meq via ORAL
  Filled 2023-09-14 (×3): qty 1

## 2023-09-14 MED ORDER — ACETAZOLAMIDE 250 MG PO TABS
250.0000 mg | ORAL_TABLET | Freq: Once | ORAL | Status: AC
  Administered 2023-09-14: 250 mg via ORAL
  Filled 2023-09-14: qty 1

## 2023-09-14 NOTE — Progress Notes (Signed)
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  11/13: AFL w/ RVR. Started on IV amio + heparin gtt. Milrinone discontinued    11/14: TEE: EF 25%, RV severely reduced, severe LAE, 3-4+ MR 11/19: RHC- RA 2, PA 45/17 (29), PCWP 11 vwaves to 18, CO/CI (thermo) 4.62/2.4, PVR 2w, PAPi 14 11/20: s/p CABG x4  (LIMA to LAD, SVG to diagonal, SVG to OM, SVG to acute marginal) + MVR by Dr. Dorris Fetch  Post-Op Day #8  5.2L urine output yesterday; CVP 8-10 this morning with mixed venous of 65. Feels that her breathing has improved after thoracentesis.   Objective:    Weight Range: 95.6 kg Body mass index is 36.18 kg/m.   Vital Signs:   Temp:  [99 F (37.2 C)] 99 F (37.2 C) (11/28 0745) Pulse Rate:  [69-85] 80 (11/28 0800) Resp:  [15-25] 24 (11/28 0800) BP: (98-124)/(42-81) 110/46 (11/28 0800) SpO2:  [90 %-97 %] 95 % (11/28 0800) Weight:  [95.6 kg] 95.6 kg (11/28 0500) Last BM Date : 09/14/23  Weight change: Filed Weights   09/12/23 0200 09/13/23 0600 09/14/23 0500  Weight: 99.4 kg 98.3 kg 95.6 kg    Intake/Output:   Intake/Output Summary (Last 24 hours) at 09/14/2023 0951 Last data filed at 09/14/2023 0800 Gross per 24 hour  Intake 214.28 ml  Output 5225 ml  Net -5010.72 ml  CVP 8-10  Physical Exam  General:  In the chair.  No resp difficulty HEENT: normal Neck: supple. JVP 9. Carotids 2+ bilat; no bruits. No lymphadenopathy or thryomegaly appreciated. RIJ  Cor: PMI nondisplaced. Regular rate & rhythm. No rubs, gallops or murmurs. Lungs: Decreased on left. On 2 liters Abdomen: soft, nontender, nondistended. No hepatosplenomegaly. No bruits or masses. Good bowel sounds. Extremities: no cyanosis, clubbing, rash, 1+ lower extremity edema.  Neuro: alert & orientedx3, cranial nerves grossly intact. moves all 4  extremities w/o difficulty. Affect pleasant  Telemetry   SR 80s    Labs    CBC Recent Labs    09/13/23 0420 09/14/23 0436  WBC 9.9 10.4  HGB 8.2* 8.9*  HCT 25.9* 28.8*  MCV 83.8 85.2  PLT 359 412*   Basic Metabolic Panel Recent Labs    16/10/96 0445 09/12/23 1313 09/13/23 0420 09/14/23 0436  NA 133*   < > 134* 132*  K 3.7   < > 3.6 3.5  CL 93*   < > 93* 94*  CO2 32   < > 32 31  GLUCOSE 125*   < > 128* 91  BUN 40*   < > 32* 27*  CREATININE 1.67*   < > 1.46* 1.38*  CALCIUM 8.1*   < > 7.9* 8.0*  MG 2.0  --  2.0  --    < > = values in this interval not displayed.    Imaging   No results found.  Medications:    Scheduled Medications:  sodium chloride   Intravenous Once   amiodarone  400 mg Oral BID   aspirin EC  81 mg Oral Daily   bisacodyl  10 mg Oral Daily   Or   bisacodyl  10 mg Rectal Daily   Chlorhexidine Gluconate Cloth  6 each Topical Daily   docusate sodium  200 mg Oral Daily   enoxaparin (LOVENOX) injection  40 mg Subcutaneous QHS  ezetimibe  10 mg Oral Daily   feeding supplement (GLUCERNA SHAKE)  237 mL Oral TID BM   gabapentin  100 mg Oral QHS   insulin aspart  0-20 Units Subcutaneous TID WC   insulin aspart  0-5 Units Subcutaneous QHS   insulin aspart  10 Units Subcutaneous TID WC   insulin glargine-yfgn  60 Units Subcutaneous BID   loratadine  10 mg Oral Daily   pantoprazole  40 mg Oral Daily   polyethylene glycol  17 g Oral Daily   potassium chloride  20 mEq Oral Q4H   rosuvastatin  40 mg Oral Daily   sodium chloride flush  3 mL Intravenous Q12H    Infusions:  albumin human Stopped (09/06/23 2310)   furosemide (LASIX) 200 mg in dextrose 5 % 100 mL (2 mg/mL) infusion 10 mg/hr (09/14/23 0945)    PRN Medications: acetaminophen, albumin human, morphine injection, ondansetron (ZOFRAN) IV, mouth rinse, oxyCODONE, sodium chloride flush, traMADol  Patient Profile   77 y/o female w/ h/o CAD s/p remote LAD and RCA PCI in 2011, Type 2DM  and HLD, recently diagnosed w/ new systolic heart failure and severe MR by echo. 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 AHF team consulted for HF optimization prior to CABG + MVR.  Assessment/Plan   1. Acute Systolic Heart Failure w/ Low Output  - Echo 2014 w/ normal EF 50-55%, normal RV - Echo 11/24 EF 25-30%, severe MR, RV low normal  - Ischemic CM. LHC w/ severe MVCAD.  - RHC c/w decompensated heart failure, severe LV dysfunction, and severe mitral regurgitation (RA 21, PAP 83/37/56, PCWP 36, CO 4.11, CI 2.04) - 11/19 RHC: RA 2, PA 45/17 (29), PCWP 11 vwaves to 18, CO/CI (thermo) 4.62/2.4, PVR 2w, PAPi 14 - TEE: EF 25%, RV severely reduced, severe LAE, 3-4+ MR - S/p CABG X 4 (LIMA to LAD, SVG to diagonal, SVG to OM, SVG to acute marginal) + MVR on 11/20 - Mixed venous 65 today, sCr continuing to improve; down to 1.38 today.  - Diamox 250mg  x 1 this morning. Will wean off milrinone and lasix gtt today with repeat COOX this evening.  - Possible transfer to floor tomorrow AM.   2. Severe MR  - appears ischemic w/ posterior leaflet tethering on echo. TEE 3-4+ MR - Now s/p MVR 11/20 at time of CABG on 11/20   3. Severe MVCAD  - Now s/p CABG X 4 (LIMA to LAD, SVG to diagonal, SVG to OM, SVG to acute marginal) on 11/20 - Aspirin + statin - No s/s angina -Continue to mobilize.   4. PAF/atrial tach  - In SR today. - Now po amiodarone.   - Will discuss AC with CTS.    5. Type 2DM  - Hgb A1c 7.8  - Insulin gtt per TCTS  6. AKI:  - in the setting of volume overload and diuresis - continuing to improve.  7. Acute blood loss anemia - Hgb now stable.   8. L Pleural effusion - s/p thoracentesis with improvement in oxygenation and symptomatic dyspnea. CXR today with persistent left sided effusion. I suspect this will improve with slow diuresis over the next few days.   Length of Stay: [address], DO  09/14/2023, 9:51 AM  Advanced Heart Failure Team Pager [phone] (M-F; 7a - 5p)  Please contact [facility] for night-coverage after hours (5p -7a ) and weekends on [email or web address]  CRITICAL CARE Performed by: Dorthula Nettles   Total critical care time: 40 minutes  Critical care time was exclusive of separately billable procedures and treating other patients.  Critical care was necessary to treat or prevent imminent or life-threatening deterioration.  Critical care was time spent personally by me on the following activities: development of treatment plan with patient and/or surrogate as well as nursing, discussions with consultants, evaluation of patient's response to treatment, examination of patient, obtaining history from patient or surrogate, ordering and performing treatments and interventions, ordering and review of laboratory studies, ordering and review of radiographic studies, pulse oximetry and re-evaluation of patient's condition.

## 2023-09-14 NOTE — Progress Notes (Signed)
      [address]       [place] [address]             [phone]                 8 Days Post-Op Procedure(s) (LRB): CORONARY ARTERY BYPASS GRAFTING (CABG) TIMES FOUR USING LEFT INTERNAL MAMMARY ARTERY AND RIGHT GREATER SAPHENOUS VEIN, HARVESTED ENDOSCOPICALLY (N/A) MITRAL VALVE REPAIR USING CARPENTIER-MCCARTHY-ADAMS ANNUOPLASTY RING (N/A) TRANSESOPHAGEAL ECHOCARDIOGRAM (N/A)   Events: No events _______________________________________________________________ Vitals: BP 117/66   Pulse 85   Temp 99 F (37.2 C) (Oral)   Resp (!) 24   Ht 5\' 4"  (1.626 m)   Wt 95.6 kg   SpO2 92%   BMI 36.18 kg/m  Filed Weights   09/12/23 0200 09/13/23 0600 09/14/23 0500  Weight: 99.4 kg 98.3 kg 95.6 kg     - Neuro: alert NAD  - Cardiovascular: sinus  Drips: milr 0.125, lasix 10.   CVP:  [10 mmHg-16 mmHg] 11 mmHg  - Pulm: EWOB    ABG    Component Value Date/Time   PHART 7.317 (L) 09/07/2023 0108   PCO2ART 43.5 09/07/2023 0108   PO2ART 79 (L) 09/07/2023 0108   HCO3 22.3 09/07/2023 0108   TCO2 24 09/07/2023 0108   ACIDBASEDEF 4.0 (H) 09/07/2023 0108   O2SAT 65.7 09/14/2023 0436    - Abd: ND - Extremity: warm  .Intake/Output      11/27 0701 11/28 0700 11/28 0701 11/29 0700   P.O.     I.V. (mL/kg) 231.6 (2.4)    Total Intake(mL/kg) 231.6 (2.4)    Urine (mL/kg/hr) 5225 (2.3)    Stool 0    Total Output 5225    Net -4993.4         Stool Occurrence 1 x       _______________________________________________________________ Labs:    Latest Ref Rng & Units 09/14/2023    4:36 AM 09/13/2023    4:20 AM 09/12/2023    4:45 AM  CBC  WBC 4.0 - 10.5 K/uL 10.4  9.9  12.3   Hemoglobin 12.0 - 15.0 g/dL 8.9  8.2  8.3   Hematocrit 36.0 - 46.0 % 28.8  25.9  25.9   Platelets 150 - 400 K/uL 412  359  318       Latest Ref Rng & Units 09/14/2023    4:36 AM 09/13/2023    4:20 AM 09/12/2023    1:13 PM  CMP  Glucose 70 - 99 mg/dL 91  865  784   BUN 8 - 23  mg/dL 27  32  37   Creatinine 0.44 - 1.00 mg/dL 6.96  2.95  2.84   Sodium 135 - 145 mmol/L 132  134  130   Potassium 3.5 - 5.1 mmol/L 3.5  3.6  3.5   Chloride 98 - 111 mmol/L 94  93  91   CO2 22 - 32 mmol/L 31  32  29   Calcium 8.9 - 10.3 mg/dL 8.0  7.9  7.8     CXR: - stable  _______________________________________________________________  Assessment and Plan: POD 8 s/p CABG MVR  Neuro: pain controlled CV: on milr and lasix.  Will discuss with CHF Pulm: IS, ambulatiokn Renal: creat trending down.  Will remove foley GI: on diet Heme: stable ID: [email or web address] Endo: stable Dispo: continue ICU care   Corliss Skains 09/14/2023 8:38 AM

## 2023-09-15 DIAGNOSIS — I5023 Acute on chronic systolic (congestive) heart failure: Secondary | ICD-10-CM | POA: Diagnosis not present

## 2023-09-15 LAB — CBC
HCT: 27.6 % — ABNORMAL LOW (ref 36.0–46.0)
Hemoglobin: 8.7 g/dL — ABNORMAL LOW (ref 12.0–15.0)
MCH: 26.7 pg (ref 26.0–34.0)
MCHC: 31.5 g/dL (ref 30.0–36.0)
MCV: 84.7 fL (ref 80.0–100.0)
Platelets: 447 10*3/uL — ABNORMAL HIGH (ref 150–400)
RBC: 3.26 MIL/uL — ABNORMAL LOW (ref 3.87–5.11)
RDW: 17.2 % — ABNORMAL HIGH (ref 11.5–15.5)
WBC: 8.9 10*3/uL (ref 4.0–10.5)
nRBC: 0 % (ref 0.0–0.2)

## 2023-09-15 LAB — GLUCOSE, CAPILLARY
Glucose-Capillary: 171 mg/dL — ABNORMAL HIGH (ref 70–99)
Glucose-Capillary: 236 mg/dL — ABNORMAL HIGH (ref 70–99)
Glucose-Capillary: 282 mg/dL — ABNORMAL HIGH (ref 70–99)
Glucose-Capillary: 192 mg/dL — ABNORMAL HIGH (ref 70–99)
Glucose-Capillary: 159 mg/dL — ABNORMAL HIGH (ref 70–99)

## 2023-09-15 LAB — BASIC METABOLIC PANEL
Anion gap: 10 (ref 5–15)
BUN: 27 mg/dL — ABNORMAL HIGH (ref 8–23)
CO2: 30 mmol/L (ref 22–32)
Calcium: 7.9 mg/dL — ABNORMAL LOW (ref 8.9–10.3)
Chloride: 93 mmol/L — ABNORMAL LOW (ref 98–111)
Creatinine, Ser: 1.39 mg/dL — ABNORMAL HIGH (ref 0.44–1.00)
GFR, Estimated: 40 mL/min — ABNORMAL LOW (ref >60)
Glucose, Bld: 242 mg/dL — ABNORMAL HIGH (ref 70–99)
Potassium: 3.3 mmol/L — ABNORMAL LOW (ref 3.5–5.1)
Sodium: 133 mmol/L — ABNORMAL LOW (ref 135–145)

## 2023-09-15 LAB — COOXEMETRY PANEL
Carboxyhemoglobin: 1.2 % (ref 0.5–1.5)
Methemoglobin: <0.7 % (ref 0.0–1.5)
O2 Saturation: 61.2 %
Total hemoglobin: 9.1 g/dL — ABNORMAL LOW (ref 12.0–16.0)

## 2023-09-15 MED ORDER — POTASSIUM CHLORIDE CRYS ER 20 MEQ PO TBCR
20.0000 meq | EXTENDED_RELEASE_TABLET | ORAL | Status: DC
  Administered 2023-09-15 (×2): 20 meq via ORAL
  Filled 2023-09-15 (×2): qty 1

## 2023-09-15 MED ORDER — ACETAZOLAMIDE 250 MG PO TABS
250.0000 mg | ORAL_TABLET | Freq: Once | ORAL | Status: AC
  Administered 2023-09-15: 250 mg via ORAL
  Filled 2023-09-15: qty 1

## 2023-09-15 MED ORDER — POTASSIUM CHLORIDE CRYS ER 20 MEQ PO TBCR
40.0000 meq | EXTENDED_RELEASE_TABLET | ORAL | Status: AC
  Administered 2023-09-15 (×2): 40 meq via ORAL
  Filled 2023-09-15 (×2): qty 2

## 2023-09-15 NOTE — Progress Notes (Signed)
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  11/13: AFL w/ RVR. Started on IV amio + heparin gtt. Milrinone discontinued    11/14: TEE: EF 25%, RV severely reduced, severe LAE, 3-4+ MR 11/19: RHC- RA 2, PA 45/17 (29), PCWP 11 vwaves to 18, CO/CI (thermo) 4.62/2.4, PVR 2w, PAPi 14 11/20: s/p CABG x4  (LIMA to LAD, SVG to diagonal, SVG to OM, SVG to acute marginal) + MVR by Dr. Dorris Fetch  Post-Op Day #9  CO-OX 61% off inotrope support.  CVP 9 sitting up in chair. 3.6L UOP yesterday with lasix gtt at 10/hr + 250 mg po diamox.   Has already ambulated around the unit this am.     Objective:    Weight Range: 94.4 kg Body mass index is 35.72 kg/m.   Vital Signs:   Temp:  [98 F (36.7 C)-99 F (37.2 C)] 98.4 F (36.9 C) (11/29 0600) Pulse Rate:  [64-83] 76 (11/29 0700) Resp:  [17-33] 27 (11/29 0700) BP: (94-130)/(45-60) 107/60 (11/29 0700) SpO2:  [90 %-98 %] 97 % (11/29 0700) Weight:  [94.4 kg] 94.4 kg (11/29 0600) Last BM Date : 09/14/23  Weight change: Filed Weights   09/13/23 0600 09/14/23 0500 09/15/23 0600  Weight: 98.3 kg 95.6 kg 94.4 kg    Intake/Output:   Intake/Output Summary (Last 24 hours) at 09/15/2023 0707 Last data filed at 09/15/2023 0700 Gross per 24 hour  Intake 373.6 ml  Output 3625 ml  Net -3251.4 ml   CVP 9  Physical Exam  General:  Well appearing. No resp difficulty HEENT: normal Neck: supple. JVP 8-10. Carotids 2+ bilat; no bruits. L internal jugular introducer Cor: PMI nondisplaced. Regular rate & rhythm. No rubs, gallops or murmurs. Lungs: clear Abdomen: obese, soft, nontender, nondistended. Extremities: no cyanosis, clubbing, rash, + TED hose Neuro: alert & orientedx3. moves all 4 extremities w/o difficulty. Affect pleasant   Telemetry   SR 60s   Labs    CBC Recent  Labs    09/14/23 0436 09/15/23 0221  WBC 10.4 8.9  HGB 8.9* 8.7*  HCT 28.8* 27.6*  MCV 85.2 84.7  PLT 412* 447*   Basic Metabolic Panel Recent Labs    16/10/96 0420 09/14/23 0436 09/15/23 0221  NA 134* 132* 133*  K 3.6 3.5 3.3*  CL 93* 94* 93*  CO2 32 31 30  GLUCOSE 128* 91 242*  BUN 32* 27* 27*  CREATININE 1.46* 1.38* 1.39*  CALCIUM 7.9* 8.0* 7.9*  MG 2.0  --   --     Imaging   No results found.  Medications:    Scheduled Medications:  sodium chloride   Intravenous Once   amiodarone  400 mg Oral BID   aspirin EC  81 mg Oral Daily   bisacodyl  10 mg Oral Daily   Or   bisacodyl  10 mg Rectal Daily   Chlorhexidine Gluconate Cloth  6 each Topical Daily   docusate sodium  200 mg Oral Daily   enoxaparin (LOVENOX) injection  40 mg Subcutaneous QHS   ezetimibe  10 mg Oral Daily   feeding supplement (GLUCERNA SHAKE)  237 mL Oral TID BM   gabapentin  100 mg Oral QHS   insulin aspart  0-20 Units Subcutaneous TID WC   insulin aspart  0-5 Units Subcutaneous QHS   insulin aspart  10 Units Subcutaneous TID WC   insulin glargine-yfgn  60 Units Subcutaneous BID   loratadine  10 mg Oral Daily   pantoprazole  40 mg Oral Daily   polyethylene glycol  17 g Oral Daily   potassium chloride  20 mEq Oral Q4H   rosuvastatin  40 mg Oral Daily   sodium chloride flush  3 mL Intravenous Q12H    Infusions:  albumin human Stopped (09/06/23 2310)   furosemide (LASIX) 200 mg in dextrose 5 % 100 mL (2 mg/mL) infusion 10 mg/hr (09/15/23 0700)    PRN Medications: acetaminophen, albumin human, morphine injection, ondansetron (ZOFRAN) IV, mouth rinse, oxyCODONE, sodium chloride flush, traMADol  Patient Profile   77 y/o female w/ h/o CAD s/p remote LAD and RCA PCI in 2011, Type 2DM and HLD, recently diagnosed w/ new systolic heart failure and severe MR by echo. 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  AHF team consulted for HF optimization prior to CABG + MVR.  Assessment/Plan   1. Acute Systolic Heart Failure w/ Low Output  - Echo 2014 w/ normal EF 50-55%, normal RV - Echo 11/24 EF 25-30%, severe MR, RV low normal  - Ischemic CM. LHC w/ severe MVCAD.  - RHC c/w decompensated heart failure, severe LV dysfunction, and severe mitral regurgitation (RA 21, PAP 83/37/56, PCWP 36, CO 4.11, CI 2.04) - 11/19 RHC: RA 2, PA 45/17 (29), PCWP 11 vwaves to 18, CO/CI (thermo) 4.62/2.4, PVR 2w, PAPi 14 - TEE: EF 25%, RV severely reduced, severe LAE, 3-4+ MR - S/p CABG X 4 (LIMA to LAD, SVG to diagonal, SVG to OM, SVG to acute marginal) + MVR on 11/20 - CO-OX 61% today off inotrope support.  - CVP 9. Give 250 mg diamox again today. Plan to discontinue lasix gtt later today and convert to po diuretic tomorrow. Supp K.   2. Severe MR  - appears ischemic w/ posterior leaflet tethering on echo. TEE 3-4+ MR - Now s/p MVR 11/20 at time of CABG on 11/20   3. Severe MVCAD  - Now s/p CABG X 4 (LIMA to LAD, SVG to diagonal, SVG to OM, SVG to acute marginal) on 11/20 - Aspirin + statin - No s/s angina -Continue to mobilize. Use IS.  4. PAF/atrial tach  - In SR today. - Now po amiodarone.   - Will discuss AC with CTS.    5. Type 2DM  - Hgb A1c 7.8  - Insulin per TCTS  6. AKI:  - in the setting of volume overload and diuresis - continuing to improve. Scr 1.39 today  7. Acute blood loss anemia - Hgb now stable in 8s  8. L Pleural effusion - s/p thoracentesis with improvement in oxygenation and symptomatic dyspnea. CXR 11/28 with persistent left sided effusion. Suspect this will improve with slow diuresis.     Length of Stay: 18  Maaz Spiering N, PA-C  09/15/2023, 7:07 AM  Advanced Heart Failure Team Pager [phone] (M-F; 7a - 5p)  Please contact [facility] for night-coverage after hours (5p -7a ) and weekends on [email or web address]  CRITICAL CARE

## 2023-09-15 NOTE — Progress Notes (Signed)
Occupational Therapy Treatment Patient Details Name: KOLEEN GLESS MRN: [id number] DOB: 1946-06-19 Today's Date: 09/15/2023   History of present illness 77 yo female admitted 11/11 for HF optimization pre CABG + MVR, R/LHC same date. 11/20 CABG x 4, MVR. 11/27 thoracentesis. PMhx: CHF, CAD, T2DM, HTN, obesity, HLD, COPD, neuropathy   OT comments  Making steady progress toward goals. Daughter able to direct mobility OOB to bathroom and to chair, demonstrating understanding of sternal precautions. Encouraged movement patterns needed for ADL tasks. Educated pt/daughter on 360 breath pattern to improve respiratory status. VSS on 3L. Continue to recommend HHOT. Acute OT to follow.       If plan is discharge home, recommend the following:  A little help with walking and/or transfers;A lot of help with bathing/dressing/bathroom;Assistance with cooking/housework;Assist for transportation;Help with stairs or ramp for entrance   Equipment Recommendations  None recommended by OT    Recommendations for Other Services      Precautions / Restrictions Precautions Precautions: Sternal;Fall;Other (comment) Restrictions Other Position/Activity Restrictions: sternal       Mobility Bed Mobility Overal bed mobility: Needs Assistance Bed Mobility: Supine to Sit   Sidelying to sit: Min assist, HOB elevated            Transfers Overall transfer level: Needs assistance Equipment used: Rolling walker (2 wheels) Transfers: Sit to/from Stand Sit to Stand: Contact guard assist                 Balance     Sitting balance-Leahy Scale: Good       Standing balance-Leahy Scale: Fair                             ADL either performed or assessed with clinical judgement   ADL       Grooming: Set up       Lower Body Bathing: Moderate assistance;Sit to/from stand       Lower Body Dressing: Moderate assistance;Sit to/from stand   Toilet Transfer: Contact guard  assist;Ambulation   Toileting- Clothing Manipulation and Hygiene: Supervision/safety;Contact guard assist Toileting - Clothing Manipulation Details (indicate cue type and reason): staying "in the tube"; encouraged to place paper between legs rather than reaching behind her bottom     Functional mobility during ADLs: Contact guard assist;Rolling walker (2 wheels);Cueing for safety      Extremity/Trunk Assessment Upper Extremity Assessment Upper Extremity Assessment: Generalized weakness   Lower Extremity Assessment Lower Extremity Assessment: Defer to PT evaluation        Vision       Perception     Praxis      Cognition Arousal: Alert Behavior During Therapy: WFL for tasks assessed/performed Overall Cognitive Status: Within Functional Limits for tasks assessed                                 General Comments: slow processing; repetition of information needed        Exercises Exercises: Other exercises Other Exercises Other Exercises: 12 STS with good hand placement Other Exercises: standing marching in RW x 20 steps; seated marching Other Exercises: 360 breath pattern with tactile cues for ant/post  and lateral chest movements with increased inhalation noted    Shoulder Instructions       General Comments VSS on 3L; plans to play Uno in the chair with her Daughter; talked [place]  Pertinent Vitals/ Pain       Pain Assessment Pain Assessment: Faces Faces Pain Scale: Hurts little more Pain Location: chest Pain Descriptors / Indicators: Discomfort Pain Intervention(s): Limited activity within patient's tolerance  Home Living                                          Prior Functioning/Environment              Frequency  Min 1X/week        Progress Toward Goals  OT Goals(current goals can now be found in the care plan section)  Progress towards OT goals: Progressing toward goals  Acute Rehab OT  Goals Patient Stated Goal: to get better OT Goal Formulation: With patient Time For Goal Achievement: 09/24/23 Potential to Achieve Goals: Good ADL Goals Pt Will Perform Eating: with modified independence;sitting Pt Will Perform Grooming: with contact guard assist;standing Pt Will Perform Upper Body Dressing: with modified independence;sitting Pt Will Perform Lower Body Dressing: with supervision;sit to/from stand Pt Will Transfer to Toilet: ambulating;with supervision Pt Will Perform Tub/Shower Transfer: with min assist;Tub transfer;shower seat;ambulating Pt/caregiver will Perform Home Exercise Program: Both right and left upper extremity;With written HEP provided  Plan      Co-evaluation                 AM-PAC OT "6 Clicks" Daily Activity     Outcome Measure   Help from another person eating meals?: A Little Help from another person taking care of personal grooming?: A Little Help from another person toileting, which includes using toliet, bedpan, or urinal?: A Little Help from another person bathing (including washing, rinsing, drying)?: A Lot Help from another person to put on and taking off regular upper body clothing?: A Lot Help from another person to put on and taking off regular lower body clothing?: A Lot 6 Click Score: 15    End of Session Equipment Utilized During Treatment: Gait belt;Rolling walker (2 wheels);Oxygen (3L)  OT Visit Diagnosis: Unsteadiness on feet (R26.81);Muscle weakness (generalized) (M62.81);Other (comment)   Activity Tolerance Patient tolerated treatment well   Patient Left in chair;with call bell/phone within reach;with family/visitor present;with nursing/sitter in room   Nurse Communication Mobility status        Time: 0272-5366 OT Time Calculation (min): 28 min  Charges: OT General Charges $OT Visit: 1 Visit OT Treatments $Self Care/Home Management : 23-37 mins  Luisa Dago, OT/L   Acute OT Clinical Specialist Acute  Rehabilitation Services Pager [phone] Office [phone]   [facility] 09/15/2023, 3:17 PM

## 2023-09-15 NOTE — Progress Notes (Signed)
      [address]       [place] [address]             [phone]                 9 Days Post-Op Procedure(s) (LRB): CORONARY ARTERY BYPASS GRAFTING (CABG) TIMES FOUR USING LEFT INTERNAL MAMMARY ARTERY AND RIGHT GREATER SAPHENOUS VEIN, HARVESTED ENDOSCOPICALLY (N/A) MITRAL VALVE REPAIR USING CARPENTIER-MCCARTHY-ADAMS ANNUOPLASTY RING (N/A) TRANSESOPHAGEAL ECHOCARDIOGRAM (N/A)   Events: No events _______________________________________________________________ Vitals: BP 107/60   Pulse 76   Temp 98.4 F (36.9 C) (Oral)   Resp (!) 27   Ht 5\' 4"  (1.626 m)   Wt 94.4 kg   SpO2 97%   BMI 35.72 kg/m  Filed Weights   09/13/23 0600 09/14/23 0500 09/15/23 0600  Weight: 98.3 kg 95.6 kg 94.4 kg     - Neuro: alert NAD  - Cardiovascular: sinus  Drips:  lasix 10.   CVP:  [6 mmHg-8 mmHg] 6 mmHg  - Pulm: EWOB    ABG    Component Value Date/Time   PHART 7.317 (L) 09/07/2023 0108   PCO2ART 43.5 09/07/2023 0108   PO2ART 79 (L) 09/07/2023 0108   HCO3 22.3 09/07/2023 0108   TCO2 24 09/07/2023 0108   ACIDBASEDEF 4.0 (H) 09/07/2023 0108   O2SAT 61.2 09/15/2023 0221    - Abd: ND - Extremity: warm  .Intake/Output      11/28 0701 11/29 0700 11/29 0701 11/30 0700   P.O. 240    I.V. (mL/kg) 133.6 (1.4)    Total Intake(mL/kg) 373.6 (4)    Urine (mL/kg/hr) 3625 (1.6)    Stool     Total Output 3625    Net -3251.4         Urine Occurrence 1 x       _______________________________________________________________ Labs:    Latest Ref Rng & Units 09/15/2023    2:21 AM 09/14/2023    4:36 AM 09/13/2023    4:20 AM  CBC  WBC 4.0 - 10.5 K/uL 8.9  10.4  9.9   Hemoglobin 12.0 - 15.0 g/dL 8.7  8.9  8.2   Hematocrit 36.0 - 46.0 % 27.6  28.8  25.9   Platelets 150 - 400 K/uL 447  412  359       Latest Ref Rng & Units 09/15/2023    2:21 AM 09/14/2023    4:36 AM 09/13/2023    4:20 AM  CMP  Glucose 70 - 99 mg/dL 914  91  782   BUN 8 - 23 mg/dL 27  27   32   Creatinine 0.44 - 1.00 mg/dL 9.56  2.13  0.86   Sodium 135 - 145 mmol/L 133  132  134   Potassium 3.5 - 5.1 mmol/L 3.3  3.5  3.6   Chloride 98 - 111 mmol/L 93  94  93   CO2 22 - 32 mmol/L 30  31  32   Calcium 8.9 - 10.3 mg/dL 7.9  8.0  7.9     CXR: - stable  _______________________________________________________________  Assessment and Plan: POD 9 s/p CABG MVR  Neuro: pain controlled CV: on lasix.  Will discuss with CHF Pulm: IS, ambulatiokn Renal: creat trending down.   GI: on diet Heme: stable ID: [email or web address] Endo: stable Dispo: continue ICU care   Corliss Skains 09/15/2023 8:45 AM

## 2023-09-15 NOTE — Plan of Care (Signed)
  Problem: Education: Goal: Understanding of CV disease, CV risk reduction, and recovery process will improve Outcome: Progressing Goal: Individualized Educational Video(s) Outcome: Progressing   Problem: Activity: Goal: Ability to return to baseline activity level will improve Outcome: Progressing   Problem: Education: Goal: Knowledge of General Education information will improve Description: Including pain rating scale, medication(s)/side effects and non-pharmacologic comfort measures Outcome: Progressing   Problem: Clinical Measurements: Goal: Ability to maintain clinical measurements within normal limits will improve Outcome: Progressing Goal: Will remain free from infection Outcome: Progressing Goal: Diagnostic test results will improve Outcome: Progressing Goal: Respiratory complications will improve Outcome: Progressing Goal: Cardiovascular complication will be avoided Outcome: Progressing   Problem: Activity: Goal: Risk for activity intolerance will decrease Outcome: Progressing   Problem: Nutrition: Goal: Adequate nutrition will be maintained Outcome: Progressing   Problem: Coping: Goal: Level of anxiety will decrease Outcome: Progressing

## 2023-09-15 NOTE — Progress Notes (Signed)
Physical Therapy Treatment Patient Details Name: Diana [name] MRN: [id number] DOB: Feb 24, 1946 Today's Date: 09/15/2023   History of Present Illness 77 yo female admitted 11/11 for HF optimization pre CABG + MVR, R/LHC same date. 11/20 CABG x 4, MVR. 11/27 thoracentesis. PMhx: CHF, CAD, T2DM, HTN, obesity, HLD, COPD, neuropathy    PT Comments  Pt pleasant and remains very willing to participate in any task provided. Pt able to walk to bathroom to void prior to gait with 3 gait trials limited by fatigue needing seated rest. Pt able to recall precautions and continues to struggle with scooting in chair and taking deep breaths. Pt unable to get beyond 250cc on IS despite education. Will continue to follow.    HR 76-85 BP 111/52   If plan is discharge home, recommend the following: A little help with bathing/dressing/bathroom;A lot of help with bathing/dressing/bathroom;Assistance with cooking/housework;Assist for transportation;Help with stairs or ramp for entrance   Can travel by private vehicle        Equipment Recommendations  None recommended by PT    Recommendations for Other Services       Precautions / Restrictions Precautions Precautions: Sternal;Fall;Other (comment) Precaution Comments: watch sats Restrictions Weight Bearing Restrictions: Yes RUE Weight Bearing: Non weight bearing LUE Weight Bearing: Non weight bearing     Mobility  Bed Mobility               General bed mobility comments: in chair on arrival and end of session    Transfers Overall transfer level: Modified independent                 General transfer comment: pt able to stand from recliner x 13 total trials and BSC without physical assist, cues for rollator brake management and sequence    Ambulation/Gait Ambulation/Gait assistance: Contact guard assist, +2 safety/equipment Gait Distance (Feet): 110 Feet Assistive device: Rollator (4 wheels) Gait Pattern/deviations: Step-through  pattern, Decreased stride length, Trunk flexed   Gait velocity interpretation: 1.31 - 2.62 ft/sec, indicative of limited community ambulator   General Gait Details: cues for proximity to rollator, brake use and chair follow for safety. Pt walked 110', 75', 110' with seated rest between trials cues for breathing technique on 3L with SPO288-95% without consistently accurate pleth   Stairs             Wheelchair Mobility     Tilt Bed    Modified Rankin (Stroke Patients Only)       Balance Overall balance assessment: Needs assistance   Sitting balance-Leahy Scale: Fair     Standing balance support: Bilateral upper extremity supported, During functional activity, Reliant on assistive device for balance   Standing balance comment: Rollator in standing                            Cognition Arousal: Alert Behavior During Therapy: WFL for tasks assessed/performed Overall Cognitive Status: Within Functional Limits for tasks assessed                                          Exercises      General Comments        Pertinent Vitals/Pain Pain Assessment Pain Assessment: 0-10 Pain Score: 2  Pain Location: chest Pain Descriptors / Indicators: Aching Pain Intervention(s): Limited activity within patient's tolerance, Repositioned, Monitored during session  Home Living                          Prior Function            PT Goals (current goals can now be found in the care plan section) Progress towards PT goals: Progressing toward goals    Frequency    Min 1X/week      PT Plan      Co-evaluation              AM-PAC PT "6 Clicks" Mobility   Outcome Measure  Help needed turning from your back to your side while in a flat bed without using bedrails?: A Little Help needed moving from lying on your back to sitting on the side of a flat bed without using bedrails?: A Little Help needed moving to and from a bed to a  chair (including a wheelchair)?: A Little Help needed standing up from a chair using your arms (e.g., wheelchair or bedside chair)?: A Little Help needed to walk in hospital room?: A Little Help needed climbing 3-5 steps with a railing? : A Lot 6 Click Score: 17    End of Session Equipment Utilized During Treatment: Gait belt;Oxygen Activity Tolerance: Patient tolerated treatment well Patient left: in chair;with call bell/phone within reach Nurse Communication: Mobility status PT Visit Diagnosis: Other abnormalities of gait and mobility (R26.89);Muscle weakness (generalized) (M62.81)     Time: 0981-1914 PT Time Calculation (min) (ACUTE ONLY): 35 min  Charges:    $Gait Training: 8-22 mins $Therapeutic Activity: 8-22 mins PT General Charges $$ ACUTE PT VISIT: 1 Visit                     Diana [name], PT Acute Rehabilitation Services Office: [phone]    Diana [name] Diana [name] 09/15/2023, 10:28 AM

## 2023-09-16 ENCOUNTER — Other Ambulatory Visit: Payer: Self-pay

## 2023-09-16 DIAGNOSIS — I5023 Acute on chronic systolic (congestive) heart failure: Secondary | ICD-10-CM | POA: Diagnosis not present

## 2023-09-16 LAB — BASIC METABOLIC PANEL
Anion gap: 9 (ref 5–15)
BUN: 29 mg/dL — ABNORMAL HIGH (ref 8–23)
CO2: 28 mmol/L (ref 22–32)
Calcium: 8.2 mg/dL — ABNORMAL LOW (ref 8.9–10.3)
Chloride: 98 mmol/L (ref 98–111)
Creatinine, Ser: 1.4 mg/dL — ABNORMAL HIGH (ref 0.44–1.00)
GFR, Estimated: 39 mL/min — ABNORMAL LOW (ref >60)
Glucose, Bld: 96 mg/dL (ref 70–99)
Potassium: 3.5 mmol/L (ref 3.5–5.1)
Sodium: 135 mmol/L (ref 135–145)

## 2023-09-16 LAB — CBC
HCT: 28.1 % — ABNORMAL LOW (ref 36.0–46.0)
Hemoglobin: 8.9 g/dL — ABNORMAL LOW (ref 12.0–15.0)
MCH: 26.7 pg (ref 26.0–34.0)
MCHC: 31.7 g/dL (ref 30.0–36.0)
MCV: 84.4 fL (ref 80.0–100.0)
Platelets: 496 10*3/uL — ABNORMAL HIGH (ref 150–400)
RBC: 3.33 MIL/uL — ABNORMAL LOW (ref 3.87–5.11)
RDW: 17.2 % — ABNORMAL HIGH (ref 11.5–15.5)
WBC: 9.6 10*3/uL (ref 4.0–10.5)
nRBC: 0 % (ref 0.0–0.2)

## 2023-09-16 LAB — GLUCOSE, CAPILLARY
Glucose-Capillary: 83 mg/dL (ref 70–99)
Glucose-Capillary: 194 mg/dL — ABNORMAL HIGH (ref 70–99)
Glucose-Capillary: 233 mg/dL — ABNORMAL HIGH (ref 70–99)
Glucose-Capillary: 238 mg/dL — ABNORMAL HIGH (ref 70–99)
Glucose-Capillary: 174 mg/dL — ABNORMAL HIGH (ref 70–99)

## 2023-09-16 LAB — MAGNESIUM: Magnesium: 2.2 mg/dL (ref 1.7–2.4)

## 2023-09-16 MED ORDER — GERHARDT'S BUTT CREAM
TOPICAL_CREAM | Freq: Four times a day (QID) | CUTANEOUS | Status: DC
  Administered 2023-09-16 – 2023-09-17 (×2): 1 via TOPICAL
  Filled 2023-09-16 (×2): qty 1

## 2023-09-16 MED ORDER — FUROSEMIDE 10 MG/ML IJ SOLN
80.0000 mg | Freq: Once | INTRAMUSCULAR | Status: AC
  Administered 2023-09-16: 80 mg via INTRAVENOUS
  Filled 2023-09-16: qty 8

## 2023-09-16 MED ORDER — POTASSIUM CHLORIDE CRYS ER 20 MEQ PO TBCR
20.0000 meq | EXTENDED_RELEASE_TABLET | ORAL | Status: DC
  Administered 2023-09-16 (×2): 20 meq via ORAL
  Filled 2023-09-16 (×3): qty 1

## 2023-09-16 MED ORDER — FUROSEMIDE 10 MG/ML IJ SOLN
40.0000 mg | Freq: Once | INTRAMUSCULAR | Status: AC
  Administered 2023-09-16: 40 mg via INTRAVENOUS
  Filled 2023-09-16: qty 4

## 2023-09-16 MED ORDER — SODIUM CHLORIDE 0.9% FLUSH
10.0000 mL | Freq: Two times a day (BID) | INTRAVENOUS | Status: DC
  Administered 2023-09-16 – 2023-09-17 (×2): 10 mL
  Administered 2023-09-17: 20 mL
  Administered 2023-09-18 – 2023-09-20 (×6): 10 mL

## 2023-09-16 MED ORDER — SODIUM CHLORIDE 0.9% FLUSH: 10.0000 mL | INTRAVENOUS | Status: DC | PRN

## 2023-09-16 MED ORDER — POTASSIUM CHLORIDE CRYS ER 20 MEQ PO TBCR
20.0000 meq | EXTENDED_RELEASE_TABLET | ORAL | Status: AC
  Administered 2023-09-16: 20 meq via ORAL
  Filled 2023-09-16: qty 1

## 2023-09-16 MED ORDER — POTASSIUM CHLORIDE CRYS ER 20 MEQ PO TBCR: 40.0000 meq | EXTENDED_RELEASE_TABLET | ORAL | Status: DC

## 2023-09-16 NOTE — Progress Notes (Signed)
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  11/13: AFL w/ RVR. Started on IV amio + heparin gtt. Milrinone discontinued    11/14: TEE: EF 25%, RV severely reduced, severe LAE, 3-4+ MR 11/19: RHC- RA 2, PA 45/17 (29), PCWP 11 vwaves to 18, CO/CI (thermo) 4.62/2.4, PVR 2w, PAPi 14 11/20: s/p CABG x4  (LIMA to LAD, SVG to diagonal, SVG to OM, SVG to acute marginal) + MVR by Dr. Dorris Fetch  POD #10 - discontinued milrinone yesterday.  - 2.9L urine output.   Objective:    Weight Range: 92.5 kg Body mass index is 35 kg/m.   Vital Signs:   Temp:  [97.9 F (36.6 C)-98.3 F (36.8 C)] 97.9 F (36.6 C) (11/30 1100) Pulse Rate:  [66-83] 77 (11/30 0949) Resp:  [13-28] 22 (11/30 0949) BP: (98-137)/(46-69) 107/50 (11/30 0800) SpO2:  [91 %-99 %] 96 % (11/30 0949) Weight:  [92.5 kg] 92.5 kg (11/30 0400) Last BM Date : 09/16/23  Weight change: Filed Weights   09/14/23 0500 09/15/23 0600 09/16/23 0400  Weight: 95.6 kg 94.4 kg 92.5 kg    Intake/Output:   Intake/Output Summary (Last 24 hours) at 09/16/2023 1209 Last data filed at 09/16/2023 0751 Gross per 24 hour  Intake 151.5 ml  Output 2050 ml  Net -1898.5 ml    Physical Exam  General:  Well appearing. No resp difficulty HEENT: normal Neck: supple. JVP 10. Carotids 2+ bilat; no bruits. L internal jugular introducer Cor: PMI nondisplaced. Regular rate & rhythm. No rubs, gallops or murmurs. Lungs: decreased at bases Abdomen: obese, soft, nontender, nondistended. Extremities: no cyanosis, clubbing, rash, + TED hose; slightly cool to touch Neuro: alert & orientedx3. moves all 4 extremities w/o difficulty. Affect pleasant   Telemetry   SR 80s   Labs    CBC Recent Labs    09/15/23 0221 09/16/23 0216  WBC 8.9 9.6  HGB 8.7* 8.9*  HCT 27.6* 28.1*  MCV 84.7  84.4  PLT 447* 496*   Basic Metabolic Panel Recent Labs    78/29/56 0221 09/16/23 0216  NA 133* 135  K 3.3* 3.5  CL 93* 98  CO2 30 28  GLUCOSE 242* 96  BUN 27* 29*  CREATININE 1.39* 1.40*  CALCIUM 7.9* 8.2*  MG  --  2.2    Imaging   No results found.  Medications:    Scheduled Medications:  sodium chloride   Intravenous Once   amiodarone  400 mg Oral BID   aspirin EC  81 mg Oral Daily   bisacodyl  10 mg Oral Daily   Or   bisacodyl  10 mg Rectal Daily   Chlorhexidine Gluconate Cloth  6 each Topical Daily   docusate sodium  200 mg Oral Daily   enoxaparin (LOVENOX) injection  40 mg Subcutaneous QHS   ezetimibe  10 mg Oral Daily   feeding supplement (GLUCERNA SHAKE)  237 mL Oral TID BM   gabapentin  100 mg Oral QHS   insulin aspart  0-20 Units Subcutaneous TID WC   insulin aspart  0-5 Units Subcutaneous QHS   insulin aspart  10 Units Subcutaneous TID WC   insulin glargine-yfgn  60 Units Subcutaneous BID   loratadine  10 mg Oral Daily   pantoprazole  40 mg Oral Daily   polyethylene glycol  17 g Oral  Daily   potassium chloride  20 mEq Oral Q4H   rosuvastatin  40 mg Oral Daily   sodium chloride flush  3 mL Intravenous Q12H    Infusions:  albumin human Stopped (09/06/23 2310)    PRN Medications: acetaminophen, albumin human, morphine injection, ondansetron (ZOFRAN) IV, mouth rinse, oxyCODONE, sodium chloride flush, traMADol  Patient Profile   77 y/o female w/ h/o CAD s/p remote LAD and RCA PCI in 2011, Type 2DM and HLD, recently diagnosed w/ new systolic heart failure and severe MR by echo. 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 AHF team consulted for HF optimization prior to CABG + MVR.  Assessment/Plan   1. Acute Systolic Heart Failure w/ Low Output  - Echo 2014 w/ normal EF 50-55%, normal RV - Echo 11/24 EF 25-30%, severe MR, RV low normal  - Ischemic CM. LHC w/  severe MVCAD.  - RHC c/w decompensated heart failure, severe LV dysfunction, and severe mitral regurgitation (RA 21, PAP 83/37/56, PCWP 36, CO 4.11, CI 2.04) - 11/19 RHC: RA 2, PA 45/17 (29), PCWP 11 vwaves to 18, CO/CI (thermo) 4.62/2.4, PVR 2w, PAPi 14 - TEE: EF 25%, RV severely reduced, severe LAE, 3-4+ MR - S/p CABG X 4 (LIMA to LAD, SVG to diagonal, SVG to OM, SVG to acute marginal) + MVR on 11/20 - Discontinued milrinone yesterday; sCr stable. 2.9L urine output yesterday; discontinued lasix gtt yesterday.  - On exam, she is hypervolemic with 1-2+ pitting edema; mid shins and down she is somewhat cool to touch. Will place PICC line to monitor COOX & CVP.  - IV lasix 80mg  this AM.   2. Severe MR  - appears ischemic w/ posterior leaflet tethering on echo. TEE 3-4+ MR - Now s/p MVR 11/20 at time of CABG on 11/20   3. Severe MVCAD  - Now s/p CABG X 4 (LIMA to LAD, SVG to diagonal, SVG to OM, SVG to acute marginal) on 11/20 - Aspirin + statin - No s/s angina -Continue to mobilize. Use IS.  4. PAF/atrial tach  - In SR today. - Now po amiodarone.   - Will discuss AC with CTS.    5. Type 2DM  - Hgb A1c 7.8  - Insulin per TCTS  6. AKI:  - in the setting of volume overload and diuresis - continuing to improve. sCr stable.   7. Acute blood loss anemia - Hgb stable.   8. L Pleural effusion - s/p thoracentesis with improvement in oxygenation and symptomatic dyspnea. CXR 11/28 with persistent left sided effusion. Suspect this will improve with slow diuresis.     Length of Stay: 50  Kendrah Lovern, DO  09/16/2023, 12:09 PM  Advanced Heart Failure Team Pager [phone] (M-F; 7a - 5p)  Please contact [facility] for night-coverage after hours (5p -7a ) and weekends on [email or web address]  CRITICAL CARE Performed by: Dorthula Nettles   Total critical care time: 38 minutes  Critical care time was exclusive of separately billable procedures and treating other patients.  Critical  care was necessary to treat or prevent imminent or life-threatening deterioration.  Critical care was time spent personally by me on the following activities: development of treatment plan with patient and/or surrogate as well as nursing, discussions with consultants, evaluation of patient's response to treatment, examination of patient, obtaining history from patient or surrogate, ordering and performing treatments and interventions, ordering and review of laboratory studies, ordering and review of  radiographic studies, pulse oximetry and re-evaluation of patient's condition.

## 2023-09-16 NOTE — Plan of Care (Signed)
  Problem: Education: Goal: Knowledge of General Education information will improve Description: Including pain rating scale, medication(s)/side effects and non-pharmacologic comfort measures Outcome: Progressing   Problem: Clinical Measurements: Goal: Respiratory complications will improve Outcome: Progressing Goal: Cardiovascular complication will be avoided Outcome: Progressing   Problem: Activity: Goal: Risk for activity intolerance will decrease Outcome: Progressing   Problem: Nutrition: Goal: Adequate nutrition will be maintained Outcome: Progressing

## 2023-09-16 NOTE — Progress Notes (Signed)
Peripherally Inserted Central Catheter Placement  The IV Nurse has discussed with the patient and/or persons authorized to consent for the patient, the purpose of this procedure and the potential benefits and risks involved with this procedure.  The benefits include less needle sticks, lab draws from the catheter, and the patient may be discharged home with the catheter. Risks include, but not limited to, infection, bleeding, blood clot (thrombus formation), and puncture of an artery; nerve damage and irregular heartbeat and possibility to perform a PICC exchange if needed/ordered by physician.  Alternatives to this procedure were also discussed.  Bard Power PICC patient education guide, fact sheet on infection prevention and patient information card has been provided to patient /or left at bedside.    PICC Placement Documentation  PICC Double Lumen 09/16/23 Right Basilic 38 cm 0 cm (Active)  Indication for Insertion or Continuance of Line Administration of hyperosmolar/irritating solutions (i.e. TPN, Vancomycin, etc.);Chronic illness with exacerbations (CF, Sickle Cell, etc.) 09/16/23 1710  Exposed Catheter (cm) 0 cm 09/16/23 1710  Site Assessment Clean, Dry, Intact 09/16/23 1710  Lumen #1 Status Flushed;Saline locked;Blood return noted 09/16/23 1710  Lumen #2 Status Flushed;Saline locked;Blood return noted 09/16/23 1710  Dressing Type Transparent;Securing device 09/16/23 1710  Dressing Status Antimicrobial disc in place;Clean, Dry, Intact 09/16/23 1710  Line Care Connections checked and tightened 09/16/23 1710  Line Adjustment (NICU/IV Team Only) No 09/16/23 1710  Dressing Intervention New dressing;Adhesive placed at insertion site (IV team only);Adhesive placed around edges of dressing (IV team/ICU RN only) 09/16/23 1710  Dressing Change Due 09/23/23 09/16/23 1710       Elliot Dally 09/16/2023, 5:11 PM

## 2023-09-17 DIAGNOSIS — I5023 Acute on chronic systolic (congestive) heart failure: Secondary | ICD-10-CM | POA: Diagnosis not present

## 2023-09-17 LAB — BASIC METABOLIC PANEL
Anion gap: 8 (ref 5–15)
BUN: 27 mg/dL — ABNORMAL HIGH (ref 8–23)
CO2: 27 mmol/L (ref 22–32)
Calcium: 8.2 mg/dL — ABNORMAL LOW (ref 8.9–10.3)
Chloride: 101 mmol/L (ref 98–111)
Creatinine, Ser: 1.31 mg/dL — ABNORMAL HIGH (ref 0.44–1.00)
GFR, Estimated: 42 mL/min — ABNORMAL LOW (ref >60)
Glucose, Bld: 72 mg/dL (ref 70–99)
Potassium: 3.6 mmol/L (ref 3.5–5.1)
Sodium: 136 mmol/L (ref 135–145)

## 2023-09-17 LAB — COOXEMETRY PANEL
Carboxyhemoglobin: 2 % — ABNORMAL HIGH (ref 0.5–1.5)
Methemoglobin: 0.8 % (ref 0.0–1.5)
O2 Saturation: 54.6 %
Total hemoglobin: 9.5 g/dL — ABNORMAL LOW (ref 12.0–16.0)

## 2023-09-17 LAB — GLUCOSE, CAPILLARY
Glucose-Capillary: 80 mg/dL (ref 70–99)
Glucose-Capillary: 148 mg/dL — ABNORMAL HIGH (ref 70–99)
Glucose-Capillary: 280 mg/dL — ABNORMAL HIGH (ref 70–99)
Glucose-Capillary: 221 mg/dL — ABNORMAL HIGH (ref 70–99)

## 2023-09-17 LAB — MAGNESIUM: Magnesium: 2.3 mg/dL (ref 1.7–2.4)

## 2023-09-17 MED ORDER — FUROSEMIDE 10 MG/ML IJ SOLN
60.0000 mg | Freq: Once | INTRAMUSCULAR | Status: AC
  Administered 2023-09-17: 60 mg via INTRAVENOUS
  Filled 2023-09-17: qty 6

## 2023-09-17 MED ORDER — POTASSIUM CHLORIDE CRYS ER 20 MEQ PO TBCR
30.0000 meq | EXTENDED_RELEASE_TABLET | ORAL | Status: AC
  Administered 2023-09-17: 30 meq via ORAL
  Administered 2023-09-17: 20 meq via ORAL
  Filled 2023-09-17 (×2): qty 1

## 2023-09-17 MED ORDER — ~~LOC~~ CARDIAC SURGERY, PATIENT & FAMILY EDUCATION: Freq: Once | Status: AC

## 2023-09-17 MED ORDER — POTASSIUM CHLORIDE CRYS ER 20 MEQ PO TBCR: 40.0000 meq | EXTENDED_RELEASE_TABLET | ORAL | Status: DC

## 2023-09-17 MED ORDER — SPIRONOLACTONE 25 MG PO TABS
25.0000 mg | ORAL_TABLET | Freq: Every day | ORAL | Status: DC
  Administered 2023-09-17 – 2023-09-21 (×5): 25 mg via ORAL
  Filled 2023-09-17 (×5): qty 1

## 2023-09-17 MED ORDER — POTASSIUM CHLORIDE CRYS ER 10 MEQ PO TBCR
10.0000 meq | EXTENDED_RELEASE_TABLET | Freq: Once | ORAL | Status: AC
  Administered 2023-09-17: 10 meq via ORAL
  Filled 2023-09-17: qty 1

## 2023-09-17 MED ORDER — POTASSIUM CHLORIDE CRYS ER 20 MEQ PO TBCR
20.0000 meq | EXTENDED_RELEASE_TABLET | ORAL | Status: DC
  Administered 2023-09-17: 20 meq via ORAL
  Filled 2023-09-17: qty 1

## 2023-09-17 NOTE — Progress Notes (Signed)
      [address]       [place] [id number]             [phone]                 11 Days Post-Op Procedure(s) (LRB): CORONARY ARTERY BYPASS GRAFTING (CABG) TIMES FOUR USING LEFT INTERNAL MAMMARY ARTERY AND RIGHT GREATER SAPHENOUS VEIN, HARVESTED ENDOSCOPICALLY (N/A) MITRAL VALVE REPAIR USING CARPENTIER-MCCARTHY-ADAMS ANNUOPLASTY RING (N/A) TRANSESOPHAGEAL ECHOCARDIOGRAM (N/A)   Events: No events _______________________________________________________________ Vitals: BP (!) 116/59   Pulse 79   Temp 98.2 F (36.8 C) (Oral)   Resp (!) 21   Ht 5\' 4"  (1.626 m)   Wt 93.6 kg   SpO2 92%   BMI 35.42 kg/m  Filed Weights   09/15/23 0600 09/16/23 0400 09/17/23 0600  Weight: 94.4 kg 92.5 kg 93.6 kg     - Neuro: alert NAD  - Cardiovascular: sinus  Drips:  CVP:  [1 mmHg-8 mmHg] 5 mmHg  - Pulm: EWOB    ABG    Component Value Date/Time   PHART 7.317 (L) 09/07/2023 0108   PCO2ART 43.5 09/07/2023 0108   PO2ART 79 (L) 09/07/2023 0108   HCO3 22.3 09/07/2023 0108   TCO2 24 09/07/2023 0108   ACIDBASEDEF 4.0 (H) 09/07/2023 0108   O2SAT 54.6 09/17/2023 0456    - Abd: ND - Extremity: warm  .Intake/Output      11/30 0701 12/01 0700 12/01 0701 12/02 0700   P.O. 1200    I.V. (mL/kg)     Total Intake(mL/kg) 1200 (12.8)    Urine (mL/kg/hr) 1950 (0.9)    Stool 0    Total Output 1950    Net -750         Urine Occurrence 3 x    Stool Occurrence 2 x       _______________________________________________________________ Labs:    Latest Ref Rng & Units 09/16/2023    2:16 AM 09/15/2023    2:21 AM 09/14/2023    4:36 AM  CBC  WBC 4.0 - 10.5 K/uL 9.6  8.9  10.4   Hemoglobin 12.0 - 15.0 g/dL 8.9  8.7  8.9   Hematocrit 36.0 - 46.0 % 28.1  27.6  28.8   Platelets 150 - 400 K/uL 496  447  412       Latest Ref Rng & Units 09/17/2023    5:00 AM 09/16/2023    2:16 AM 09/15/2023    2:21 AM  CMP  Glucose 70 - 99 mg/dL 72  96  914   BUN 8 - 23 mg/dL  27  29  27    Creatinine 0.44 - 1.00 mg/dL 7.82  9.56  2.13   Sodium 135 - 145 mmol/L 136  135  133   Potassium 3.5 - 5.1 mmol/L 3.6  3.5  3.3   Chloride 98 - 111 mmol/L 101  98  93   CO2 22 - 32 mmol/L 27  28  30    Calcium 8.9 - 10.3 mg/dL 8.2  8.2  7.9     CXR: - stable  _______________________________________________________________  Assessment and Plan: POD 10 s/p CABG MVR  Neuro: pain controlled CV: on lasix.  Will discuss with CHF Pulm: IS, ambulation Renal: creat trending down.   GI: on diet Heme: stable ID: afebrile Endo: stable Dispo: possible floor today   Corliss Skains 09/17/2023 10:32 AM

## 2023-09-17 NOTE — Progress Notes (Signed)
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  11/13: AFL w/ RVR. Started on IV amio + heparin gtt. Milrinone discontinued    11/14: TEE: EF 25%, RV severely reduced, severe LAE, 3-4+ MR 11/19: RHC- RA 2, PA 45/17 (29), PCWP 11 vwaves to 18, CO/CI (thermo) 4.62/2.4, PVR 2w, PAPi 14 11/20: s/p CABG x4  (LIMA to LAD, SVG to diagonal, SVG to OM, SVG to acute marginal) + MVR by Dr. Dorris Fetch  POD #11 - CVP 5 sitting up this AM - Mixed venous 55% - Feels better today  Objective:    Weight Range: 93.6 kg Body mass index is 35.42 kg/m.   Vital Signs:   Temp:  [97.9 F (36.6 C)-98.7 F (37.1 C)] 98.2 F (36.8 C) (12/01 0600) Pulse Rate:  [64-82] 82 (12/01 0700) Resp:  [18-28] 24 (12/01 0700) BP: (97-124)/(44-66) 123/66 (12/01 0700) SpO2:  [92 %-97 %] 92 % (12/01 0700) Weight:  [93.6 kg] 93.6 kg (12/01 0600) Last BM Date : 09/16/23  Weight change: Filed Weights   09/15/23 0600 09/16/23 0400 09/17/23 0600  Weight: 94.4 kg 92.5 kg 93.6 kg    Intake/Output:   Intake/Output Summary (Last 24 hours) at 09/17/2023 0927 Last data filed at 09/17/2023 0600 Gross per 24 hour  Intake 960 ml  Output 1800 ml  Net -840 ml    Physical Exam  General:  Well appearing. No resp difficulty HEENT: normal Neck: supple. JVP 7-8. Carotids 2+ bilat; no bruits. L internal jugular introducer Cor: PMI nondisplaced. Regular rate & rhythm. No rubs, gallops or murmurs. Lungs: decreased at bases Abdomen: obese, soft, nontender, nondistended. Extremities: no cyanosis, clubbing, rash, + TED hose; +1 edema Neuro: alert & orientedx3. moves all 4 extremities w/o difficulty. Affect pleasant   Telemetry   SR 80s   Labs    CBC Recent Labs    09/15/23 0221 09/16/23 0216  WBC 8.9 9.6  HGB 8.7* 8.9*  HCT 27.6* 28.1*  MCV 84.7 84.4  PLT  447* 496*   Basic Metabolic Panel Recent Labs    02/72/53 0216 09/17/23 0500  NA 135 136  K 3.5 3.6  CL 98 101  CO2 28 27  GLUCOSE 96 72  BUN 29* 27*  CREATININE 1.40* 1.31*  CALCIUM 8.2* 8.2*  MG 2.2 2.3    Imaging   [place] EKG SITE RITE  Result Date: 09/16/2023 If Site Rite image not attached, placement could not be confirmed due to current cardiac rhythm.   Medications:    Scheduled Medications:  sodium chloride   Intravenous Once   amiodarone  400 mg Oral BID   aspirin EC  81 mg Oral Daily   bisacodyl  10 mg Oral Daily   Or   bisacodyl  10 mg Rectal Daily   Chlorhexidine Gluconate Cloth  6 each Topical Daily   docusate sodium  200 mg Oral Daily   enoxaparin (LOVENOX) injection  40 mg Subcutaneous QHS   ezetimibe  10 mg Oral Daily   feeding supplement (GLUCERNA SHAKE)  237 mL Oral TID BM   furosemide  60 mg Intravenous Once   gabapentin  100 mg Oral QHS   Gerhardt's butt cream   Topical QID   insulin aspart  0-20 Units Subcutaneous TID WC   insulin aspart  0-5 Units Subcutaneous QHS   insulin  aspart  10 Units Subcutaneous TID WC   insulin glargine-yfgn  60 Units Subcutaneous BID   loratadine  10 mg Oral Daily   pantoprazole  40 mg Oral Daily   polyethylene glycol  17 g Oral Daily   potassium chloride  20 mEq Oral Q4H   rosuvastatin  40 mg Oral Daily   sodium chloride flush  10-40 mL Intracatheter Q12H   sodium chloride flush  3 mL Intravenous Q12H    Infusions:  albumin human Stopped (09/06/23 2310)    PRN Medications: acetaminophen, albumin human, morphine injection, ondansetron (ZOFRAN) IV, mouth rinse, oxyCODONE, sodium chloride flush, sodium chloride flush, traMADol  Patient Profile   77 y/o female w/ h/o CAD s/p remote LAD and RCA PCI in 2011, Type 2DM and HLD, recently diagnosed w/ new systolic heart failure and severe MR by echo. 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 AHF team consulted for HF optimization prior to CABG + MVR.  Assessment/Plan   1. Acute Systolic Heart Failure w/ Low Output  - Echo 2014 w/ normal EF 50-55%, normal RV - Echo 11/24 EF 25-30%, severe MR, RV low normal  - Ischemic CM. LHC w/ severe MVCAD.  - RHC c/w decompensated heart failure, severe LV dysfunction, and severe mitral regurgitation (RA 21, PAP 83/37/56, PCWP 36, CO 4.11, CI 2.04) - 11/19 RHC: RA 2, PA 45/17 (29), PCWP 11 vwaves to 18, CO/CI (thermo) 4.62/2.4, PVR 2w, PAPi 14 - TEE: EF 25%, RV severely reduced, severe LAE, 3-4+ MR - S/p CABG X 4 (LIMA to LAD, SVG to diagonal, SVG to OM, SVG to acute marginal) + MVR on 11/20 - Mixed venous 55% today; CVP 5 with mild edema diffusely. IV lasix 60mg  x 1 today.  - start spironolactone 25mg  today  - start losartan tomorrow.  - Transfer to floor today.   2. Severe MR  - appears ischemic w/ posterior leaflet tethering on echo. TEE 3-4+ MR - Now s/p MVR 11/20 at time of CABG on 11/20   3. Severe MVCAD  - Now s/p CABG X 4 (LIMA to LAD, SVG to diagonal, SVG to OM, SVG to acute marginal) on 11/20 - Aspirin + statin - No s/s angina -Continue to mobilize. Use IS.  4. PAF/atrial tach  - In SR today. - Now po amiodarone.   - Will discuss AC with CTS.    5. Type 2DM  - Hgb A1c 7.8  - Insulin per TCTS  6. AKI:  -sCr continuing to improve; stable.   7. Acute blood loss anemia - Hgb stable.   8. L Pleural effusion - improved lung sounds   Length of Stay: 20  Tyneshia Stivers, DO  09/17/2023, 9:27 AM  Advanced Heart Failure Team Pager [phone] (M-F; 7a - 5p)  Please contact [facility] for night-coverage after hours (5p -7a ) and weekends on [email or web address]

## 2023-09-17 NOTE — Progress Notes (Signed)
      [address]       [place] [address]             [phone]                 11 Days Post-Op Procedure(s) (LRB): CORONARY ARTERY BYPASS GRAFTING (CABG) TIMES FOUR USING LEFT INTERNAL MAMMARY ARTERY AND RIGHT GREATER SAPHENOUS VEIN, HARVESTED ENDOSCOPICALLY (N/A) MITRAL VALVE REPAIR USING CARPENTIER-MCCARTHY-ADAMS ANNUOPLASTY RING (N/A) TRANSESOPHAGEAL ECHOCARDIOGRAM (N/A)   Events: No events _______________________________________________________________ Vitals: BP (!) 116/59   Pulse 79   Temp 98.2 F (36.8 C) (Oral)   Resp (!) 21   Ht 5\' 4"  (1.626 m)   Wt 93.6 kg   SpO2 92%   BMI 35.42 kg/m  Filed Weights   09/15/23 0600 09/16/23 0400 09/17/23 0600  Weight: 94.4 kg 92.5 kg 93.6 kg     - Neuro: alert NAD  - Cardiovascular: sinus  Drips:  CVP:  [1 mmHg-8 mmHg] 5 mmHg  - Pulm: EWOB    ABG    Component Value Date/Time   PHART 7.317 (L) 09/07/2023 0108   PCO2ART 43.5 09/07/2023 0108   PO2ART 79 (L) 09/07/2023 0108   HCO3 22.3 09/07/2023 0108   TCO2 24 09/07/2023 0108   ACIDBASEDEF 4.0 (H) 09/07/2023 0108   O2SAT 54.6 09/17/2023 0456    - Abd: ND - Extremity: warm  .Intake/Output      11/30 0701 12/01 0700 12/01 0701 12/02 0700   P.O. 1200    I.V. (mL/kg)     Total Intake(mL/kg) 1200 (12.8)    Urine (mL/kg/hr) 1950 (0.9)    Stool 0    Total Output 1950    Net -750         Urine Occurrence 3 x    Stool Occurrence 2 x       _______________________________________________________________ Labs:    Latest Ref Rng & Units 09/16/2023    2:16 AM 09/15/2023    2:21 AM 09/14/2023    4:36 AM  CBC  WBC 4.0 - 10.5 K/uL 9.6  8.9  10.4   Hemoglobin 12.0 - 15.0 g/dL 8.9  8.7  8.9   Hematocrit 36.0 - 46.0 % 28.1  27.6  28.8   Platelets 150 - 400 K/uL 496  447  412       Latest Ref Rng & Units 09/17/2023    5:00 AM 09/16/2023    2:16 AM 09/15/2023    2:21 AM  CMP  Glucose 70 - 99 mg/dL 72  96  347   BUN 8 - 23 mg/dL  27  29  27    Creatinine 0.44 - 1.00 mg/dL 4.25  9.56  3.87   Sodium 135 - 145 mmol/L 136  135  133   Potassium 3.5 - 5.1 mmol/L 3.6  3.5  3.3   Chloride 98 - 111 mmol/L 101  98  93   CO2 22 - 32 mmol/L 27  28  30    Calcium 8.9 - 10.3 mg/dL 8.2  8.2  7.9     CXR: - stable  _______________________________________________________________  Assessment and Plan: POD 11 s/p CABG MVR  Neuro: pain controlled CV: on lasix.  Will discuss with CHF Pulm: IS, ambulation Renal: creat trending down.   GI: on diet Heme: stable ID: afebrile Endo: stable Dispo:  floor today   Corliss Skains 09/17/2023 10:33 AM

## 2023-09-18 ENCOUNTER — Telehealth (HOSPITAL_COMMUNITY): Payer: Self-pay | Admitting: Pharmacy Technician

## 2023-09-18 ENCOUNTER — Ambulatory Visit: Payer: Medicare HMO | Admitting: Nurse Practitioner

## 2023-09-18 ENCOUNTER — Inpatient Hospital Stay (HOSPITAL_COMMUNITY): Payer: Medicare HMO

## 2023-09-18 ENCOUNTER — Other Ambulatory Visit (HOSPITAL_COMMUNITY): Payer: Self-pay

## 2023-09-18 DIAGNOSIS — I428 Other cardiomyopathies: Secondary | ICD-10-CM | POA: Diagnosis not present

## 2023-09-18 DIAGNOSIS — I5023 Acute on chronic systolic (congestive) heart failure: Secondary | ICD-10-CM | POA: Diagnosis not present

## 2023-09-18 LAB — COOXEMETRY PANEL
Carboxyhemoglobin: 1.3 % (ref 0.5–1.5)
Methemoglobin: <0.7 % (ref 0.0–1.5)
O2 Saturation: 59.6 %
Total hemoglobin: 9.6 g/dL — ABNORMAL LOW (ref 12.0–16.0)

## 2023-09-18 LAB — CBC
HCT: 28.7 % — ABNORMAL LOW (ref 36.0–46.0)
Hemoglobin: 8.8 g/dL — ABNORMAL LOW (ref 12.0–15.0)
MCH: 26.1 pg (ref 26.0–34.0)
MCHC: 30.7 g/dL (ref 30.0–36.0)
MCV: 85.2 fL (ref 80.0–100.0)
Platelets: 552 10*3/uL — ABNORMAL HIGH (ref 150–400)
RBC: 3.37 MIL/uL — ABNORMAL LOW (ref 3.87–5.11)
RDW: 17.6 % — ABNORMAL HIGH (ref 11.5–15.5)
WBC: 10.2 10*3/uL (ref 4.0–10.5)
nRBC: 0 % (ref 0.0–0.2)

## 2023-09-18 LAB — GLUCOSE, CAPILLARY
Glucose-Capillary: 65 mg/dL — ABNORMAL LOW (ref 70–99)
Glucose-Capillary: 275 mg/dL — ABNORMAL HIGH (ref 70–99)
Glucose-Capillary: 204 mg/dL — ABNORMAL HIGH (ref 70–99)
Glucose-Capillary: 185 mg/dL — ABNORMAL HIGH (ref 70–99)
Glucose-Capillary: 116 mg/dL — ABNORMAL HIGH (ref 70–99)

## 2023-09-18 LAB — BASIC METABOLIC PANEL
Anion gap: 9 (ref 5–15)
BUN: 25 mg/dL — ABNORMAL HIGH (ref 8–23)
CO2: 28 mmol/L (ref 22–32)
Calcium: 8.3 mg/dL — ABNORMAL LOW (ref 8.9–10.3)
Chloride: 103 mmol/L (ref 98–111)
Creatinine, Ser: 1.17 mg/dL — ABNORMAL HIGH (ref 0.44–1.00)
GFR, Estimated: 49 mL/min — ABNORMAL LOW (ref >60)
Glucose, Bld: 88 mg/dL (ref 70–99)
Potassium: 4.1 mmol/L (ref 3.5–5.1)
Sodium: 140 mmol/L (ref 135–145)

## 2023-09-18 LAB — ECHOCARDIOGRAM LIMITED
Calc EF: 34.5 %
Height: 64 in
MV M vel: 2.66 m/s
MV Peak grad: 28.3 mm[Hg]
S' Lateral: 5.1 cm
Single Plane A2C EF: 40.5 %
Single Plane A4C EF: 28.3 %
Weight: 3287.5 [oz_av]

## 2023-09-18 LAB — MAGNESIUM: Magnesium: 2.4 mg/dL (ref 1.7–2.4)

## 2023-09-18 MED ORDER — POTASSIUM CHLORIDE CRYS ER 20 MEQ PO TBCR
20.0000 meq | EXTENDED_RELEASE_TABLET | Freq: Every day | ORAL | Status: DC
  Administered 2023-09-18 – 2023-09-20 (×3): 20 meq via ORAL
  Filled 2023-09-18 (×3): qty 1

## 2023-09-18 MED ORDER — DAPAGLIFLOZIN PROPANEDIOL 10 MG PO TABS
10.0000 mg | ORAL_TABLET | Freq: Every day | ORAL | Status: DC
  Administered 2023-09-18 – 2023-09-21 (×4): 10 mg via ORAL
  Filled 2023-09-18 (×4): qty 1

## 2023-09-18 MED ORDER — PERFLUTREN LIPID MICROSPHERE
1.0000 mL | INTRAVENOUS | Status: AC | PRN
  Administered 2023-09-18: 10 mL via INTRAVENOUS

## 2023-09-18 MED ORDER — APIXABAN 5 MG PO TABS
5.0000 mg | ORAL_TABLET | Freq: Two times a day (BID) | ORAL | Status: DC
  Administered 2023-09-18 – 2023-09-21 (×7): 5 mg via ORAL
  Filled 2023-09-18 (×7): qty 1

## 2023-09-18 MED ORDER — LOSARTAN POTASSIUM 25 MG PO TABS: 25.0000 mg | ORAL_TABLET | Freq: Every day | ORAL | Status: DC

## 2023-09-18 MED ORDER — FUROSEMIDE 40 MG PO TABS
40.0000 mg | ORAL_TABLET | Freq: Every day | ORAL | Status: DC
  Administered 2023-09-18 – 2023-09-21 (×4): 40 mg via ORAL
  Filled 2023-09-18 (×4): qty 1

## 2023-09-18 MED ORDER — INSULIN GLARGINE-YFGN 100 UNIT/ML ~~LOC~~ SOLN
50.0000 [IU] | Freq: Two times a day (BID) | SUBCUTANEOUS | Status: DC
  Administered 2023-09-18 – 2023-09-19 (×3): 50 [IU] via SUBCUTANEOUS
  Filled 2023-09-18 (×6): qty 0.5

## 2023-09-18 MED ORDER — FUROSEMIDE 40 MG PO TABS
40.0000 mg | ORAL_TABLET | Freq: Every day | ORAL | Status: DC
  Administered 2023-09-18: 40 mg via ORAL
  Filled 2023-09-18: qty 1

## 2023-09-18 MED ORDER — INSULIN ASPART 100 UNIT/ML IJ SOLN
15.0000 [IU] | Freq: Three times a day (TID) | INTRAMUSCULAR | Status: DC
  Administered 2023-09-18 – 2023-09-19 (×4): 15 [IU] via SUBCUTANEOUS

## 2023-09-18 MED ORDER — LOSARTAN POTASSIUM 25 MG PO TABS
12.5000 mg | ORAL_TABLET | Freq: Every day | ORAL | Status: DC
  Administered 2023-09-18 – 2023-09-19 (×2): 12.5 mg via ORAL
  Filled 2023-09-18 (×2): qty 1

## 2023-09-18 NOTE — Plan of Care (Signed)

## 2023-09-18 NOTE — TOC Progression Note (Addendum)
Transition of Care [facility]) - Progression Note    Patient Details  Name: Diana [name] MRN: [id number] Date of Birth: [birth year]  [facility] CM/SW Contact  Nicanor Bake Phone Number: [phone] 09/18/2023, 2:39 PM  Clinical Narrative:  12:39 PM- HF CSW attempted to meet with pt at bedside but nurse was in the room. CSW will follow up with pt at a more appropriate time.   HF CSW met with pt at bedside. Pt stated that her daughter and husband will provide transportation home at dc.  TOC will continue following.      Expected Discharge Plan: Home w Home Health Services Barriers to Discharge: Continued Medical Work up  Expected Discharge Plan and Services   Discharge Planning Services: CM Consult Post Acute Care Choice: Home Health Living arrangements for the past 2 months: Single Family Home                   DME Agency: [employer] Date DME Agency Contacted: 09/01/23 Time DME Agency Contacted: 1430 Representative spoke with at DME Agency: [facility] HH Arranged: RN, PT HH Agency: [facility] Date [facility] Agency Contacted: 09/01/23 Time HH Agency Contacted: 1432 Representative spoke with at [facility] Agency: Hassel Neth   Social Determinants of Health (SDOH) Interventions SDOH Screenings   Food Insecurity: No Food Insecurity (08/28/2023)  Housing: Low Risk  (08/28/2023)  Transportation Needs: No Transportation Needs (08/28/2023)  Utilities: Not At Risk (08/28/2023)  Tobacco Use: Low Risk  (09/06/2023)    Readmission Risk Interventions     No data to display

## 2023-09-18 NOTE — Progress Notes (Signed)
Physical Therapy Treatment Patient Details Name: Diana [name] MRN: [id number] DOB: 04-23-1946 Today's Date: 09/18/2023   History of Present Illness 77 yo female admitted 11/11 for HF optimization pre CABG + MVR, R/LHC same date. 11/20 CABG x 4, MVR. 11/27 thoracentesis. PMhx: CHF, CAD, T2DM, HTN, obesity, HLD, COPD, neuropathy    PT Comments  Pt making steady progress with mobility. Has been up amb to bathroom multiple times this afternoon due to diuretic. Pt moving well. Fatigues with activity but instructed pt to pace herself at home by getting up frequently and perform short bouts of activity.     If plan is discharge home, recommend the following: A little help with bathing/dressing/bathroom;A lot of help with bathing/dressing/bathroom;Assistance with cooking/housework;Assist for transportation;Help with stairs or ramp for entrance   Can travel by private vehicle        Equipment Recommendations       Recommendations for Other Services       Precautions / Restrictions Precautions Precautions: Sternal;Fall Restrictions Other Position/Activity Restrictions: sternal     Mobility  Bed Mobility Overal bed mobility: Needs Assistance Bed Mobility: Sit to Sidelying         Sit to sidelying: Min assist General bed mobility comments: Assist to bring legs back up into bed and to guide trunk    Transfers Overall transfer level: Needs assistance Equipment used: Rollator (4 wheels) Transfers: Sit to/from Stand Sit to Stand: Contact guard assist           General transfer comment: Pt with good compliance with sternal precautions. Used rocking momentum to come up from low commode.    Ambulation/Gait Ambulation/Gait assistance: Supervision Gait Distance (Feet): 50 Feet Assistive device: Rollator (4 wheels) Gait Pattern/deviations: Step-through pattern, Decreased stride length Gait velocity: decr Gait velocity interpretation: 1.31 - 2.62 ft/sec, indicative of limited  community ambulator   General Gait Details: Assist for safety. Pt with good use of rollator. Amb only short distance since pt has been up/down to bathroom multiple times this PM after diuretic has been given   Optometrist     Tilt Bed    Modified Rankin (Stroke Patients Only)       Balance Overall balance assessment: Needs assistance Sitting-balance support: No upper extremity supported, Feet supported Sitting balance-Leahy Scale: Good     Standing balance support: No upper extremity supported Standing balance-Leahy Scale: Fair                              Cognition Arousal: Alert Behavior During Therapy: WFL for tasks assessed/performed Overall Cognitive Status: Within Functional Limits for tasks assessed                                          Exercises      General Comments General comments (skin integrity, edema, etc.): VSS on RA      Pertinent Vitals/Pain Pain Assessment Pain Assessment: No/denies pain    Home Living                          Prior Function            PT Goals (current goals can now be found in the care plan section) Progress towards PT goals:  Progressing toward goals    Frequency    Min 1X/week      PT Plan      Co-evaluation              AM-PAC PT "6 Clicks" Mobility   Outcome Measure  Help needed turning from your back to your side while in a flat bed without using bedrails?: A Little Help needed moving from lying on your back to sitting on the side of a flat bed without using bedrails?: A Little Help needed moving to and from a bed to a chair (including a wheelchair)?: A Little Help needed standing up from a chair using your arms (e.g., wheelchair or bedside chair)?: A Little Help needed to walk in hospital room?: A Little Help needed climbing 3-5 steps with a railing? : A Lot 6 Click Score: 17    End of Session   Activity Tolerance:  Patient tolerated treatment well Patient left: in bed;with call bell/phone within reach;with bed alarm set   PT Visit Diagnosis: Other abnormalities of gait and mobility (R26.89);Muscle weakness (generalized) (M62.81)     Time: 1610-9604 PT Time Calculation (min) (ACUTE ONLY): 15 min  Charges:    $Gait Training: 8-22 mins PT General Charges $$ ACUTE PT VISIT: 1 Visit                     [facility] PT Acute Rehabilitation Services Office [phone]    Diana [name] [facility] 09/18/2023, 6:06 PM

## 2023-09-18 NOTE — Progress Notes (Signed)
Echocardiogram 2D Echocardiogram has been performed.  Lucendia Herrlich 09/18/2023, 12:52 PM

## 2023-09-18 NOTE — Progress Notes (Signed)
CARDIAC REHAB PHASE I   PRE:  Rate/Rhythm: 80 SR    BP: sitting 121/55    SpO2: 96 RA  MODE:  Ambulation: 88 ft x2   POST:  Rate/Rhythm: 91 SR    BP: sitting 151/65     SpO2: 96 RA  Pt in recliner. Stood with rocking, ambulated with her rollator and contact guard with gait belt. DOE with distance, reminders to pace herself. Fatigue after 88 ft and requested to sit to rest. Return to room and recliner. Practiced IS, still only 250 ml however pt only did 400 ml preop. Encouraged x2 more walks today, can go with her daughter if needed.  1610-9604   Ethelda Chick BS, ACSM-CEP 09/18/2023 10:26 AM

## 2023-09-18 NOTE — Telephone Encounter (Signed)
Patient Product/process development scientist completed.    The patient is insured through [employer]. Patient has Medicare and is not eligible for a copay card, but may be able to apply for patient assistance, if available.    Ran test claim for Farxiga 10 mg and the current 30 day co-pay is $136.90 due to being in Coverage Gap (donut hole)  Ran test claim for Jardiance 10 mg and the current 30 day co-pay is $143.69 due to being in Coverage Gap (donut hole).   This test claim was processed through [facility]- copay amounts may vary at other pharmacies due to pharmacy/plan contracts, or as the patient moves through the different stages of their insurance plan.     Diana [name], CPHT Pharmacy Technician III Certified Patient Advocate [facility] Patient Advocate Team Direct Number: [phone]  Fax: [phone]

## 2023-09-18 NOTE — Progress Notes (Addendum)
[address]       [place] [address]             [phone]      12 Days Post-Op Procedure(s) (LRB): CORONARY ARTERY BYPASS GRAFTING (CABG) TIMES FOUR USING LEFT INTERNAL MAMMARY ARTERY AND RIGHT GREATER SAPHENOUS VEIN, HARVESTED ENDOSCOPICALLY (N/A) MITRAL VALVE REPAIR USING CARPENTIER-MCCARTHY-ADAMS ANNUOPLASTY RING (N/A) TRANSESOPHAGEAL ECHOCARDIOGRAM (N/A) Subjective: Patient sitting up in the chair, states she feels much better today. Denies pain and shortness of breath this AM.   Objective: Vital signs in last 24 hours: Temp:  [97.6 F (36.4 C)-98.5 F (36.9 C)] 97.6 F (36.4 C) (12/02 0744) Pulse Rate:  [71-88] 88 (12/02 0744) Cardiac Rhythm: Normal sinus rhythm;Heart block (12/01 1900) Resp:  [20-25] 20 (12/02 0744) BP: (115-139)/(51-61) 139/54 (12/02 0744) SpO2:  [92 %-100 %] 100 % (12/02 0744) Weight:  [93.2 kg-95.3 kg] 93.2 kg (12/02 0624)  Hemodynamic parameters for last 24 hours: CVP:  [5 mmHg-7 mmHg] 7 mmHg  Intake/Output from previous day: 12/01 0701 - 12/02 0700 In: 240 [P.O.:240] Out: 500 [Urine:500] Intake/Output this shift: No intake/output data recorded.  General appearance: alert, cooperative, and no distress Neurologic: intact Heart: regular rate and rhythm, S1, S2 normal, no murmur, click, rub or gallop Lungs: Diminished breath sounds bibasilar Abdomen: soft, non-tender; bowel sounds normal; no masses,  no organomegaly Extremities: edema trace-1+ Wound: Clean and dry without sign of infection  Lab Results: Recent Labs    09/16/23 0216 09/18/23 0407  WBC 9.6 10.2  HGB 8.9* 8.8*  HCT 28.1* 28.7*  PLT 496* 552*   BMET:  Recent Labs    09/17/23 0500 09/18/23 0407  NA 136 140  K 3.6 4.1  CL 101 103  CO2 27 28  GLUCOSE 72 88  BUN 27* 25*  CREATININE 1.31* 1.17*  CALCIUM 8.2* 8.3*    PT/INR: No results for input(s): "LABPROT", "INR" in the last 72 hours. ABG    Component Value Date/Time   PHART  7.317 (L) 09/07/2023 0108   HCO3 22.3 09/07/2023 0108   TCO2 24 09/07/2023 0108   ACIDBASEDEF 4.0 (H) 09/07/2023 0108   O2SAT 59.6 09/18/2023 0407   CBG (last 3)  Recent Labs    09/17/23 1729 09/17/23 2110 09/18/23 0607  GLUCAP 280* 221* 65*    Assessment/Plan: S/P Procedure(s) (LRB): CORONARY ARTERY BYPASS GRAFTING (CABG) TIMES FOUR USING LEFT INTERNAL MAMMARY ARTERY AND RIGHT GREATER SAPHENOUS VEIN, HARVESTED ENDOSCOPICALLY (N/A) MITRAL VALVE REPAIR USING CARPENTIER-MCCARTHY-ADAMS ANNUOPLASTY RING (N/A) TRANSESOPHAGEAL ECHOCARDIOGRAM (N/A)  CV: Acute systolic HF, AHF team following. Coox 59.6% this AM. BP controlled, no BB. On Amiodarone 400mg  BID for history of PAF. Per Dr. Sunday Corn note on 11/27 start Eliquis for afib once thoracentesis completed. Will plan to start Eliquis today. NSR, HR 80s.1st degree HB. On Spironolactone for GDMT. Per AHF note yesterday, start Losartan today.   Pulm: Left thoracentesis done on 11/27 with improved aeration. Saturating well on RA. Encourage IS and ambulation  GI: Passing gas, +BM yesterday.  Endo: T2DM, preop A1C 7.8. On Novolin 70/30 60U BID, Metformin 1000mg  BID and Semaglutide at home. CBGs mostly elevated but 65 this AM. Adjusting, now on Semglee 50U BID, Novolog 15U TID with meals, and SSI PRN.   Renal: Cr 1.17, trending down. Only 500cc of UO recorded. Given IV Lasix yesterday. Looks like only about +2lbs from preop weight. -5lbs from IV Lasix yesterday. PO Lasix today.   Expected postop ABLA: H/H 8.8/28.7,  stable. Not clinically significant at this time.   DVT Prophylaxis: Lovenox  Deconditioning: Continue work with PT/OT. Home health at discharge.   Dispo: Titrating GDMT, dispo planning   LOS: 21 days    Jenny Reichmann, PA-C 09/18/2023

## 2023-09-18 NOTE — Care Management Important Message (Signed)
Important Message  Patient Details  Name: NYLE BLAKENSHIP MRN: [id number] Date of Birth: 22-Oct-1945   Important Message Given:  Yes - Medicare IM     Sherilyn Banker 09/18/2023, 4:21 PM

## 2023-09-18 NOTE — Progress Notes (Addendum)
Advanced Heart Failure Rounding Note  PCP-Cardiologist: Nicki Guadalajara, MD   Subjective:   11/11: R/LHC severe MVCAD, RA 21, PAP 83/37/56, PCWP 36, CO 4.11, CI 2.04>>started on milrinone + IV Lasix.  11/13: AFL w/ RVR. Started on IV amio + heparin gtt. Milrinone discontinued    11/14: TEE: EF 25%, RV severely reduced, severe LAE, 3-4+ MR 11/19: RHC- RA 2, PA 45/17 (29), PCWP 11 vwaves to 18, CO/CI (thermo) 4.62/2.4, PVR 2w, PAPi 14 11/20: s/p CABG x4  (LIMA to LAD, SVG to diagonal, SVG to OM, SVG to acute marginal) + MVR by Dr. Dorris Fetch  POD #12 - CVP unhooked  - Mixed venous 60% - Family at bedside. Denies CP/SOB. Has been ambulating. Weight down 5lbs.   Objective:    Weight Range: 93.2 kg Body mass index is 35.27 kg/m.   Vital Signs:   Temp:  [97.6 F (36.4 C)-98.5 F (36.9 C)] 97.6 F (36.4 C) (12/02 0744) Pulse Rate:  [71-88] 88 (12/02 0744) Resp:  [20-25] 20 (12/02 0744) BP: (115-139)/(51-61) 139/54 (12/02 0744) SpO2:  [93 %-100 %] 100 % (12/02 0744) Weight:  [93.2 kg-95.3 kg] 93.2 kg (12/02 0624) Last BM Date : 09/17/23  Weight change: Filed Weights   09/17/23 0600 09/18/23 0608 09/18/23 0624  Weight: 93.6 kg 95.3 kg 93.2 kg    Intake/Output:   Intake/Output Summary (Last 24 hours) at 09/18/2023 0944 Last data filed at 09/17/2023 2119 Gross per 24 hour  Intake 240 ml  Output 500 ml  Net -260 ml    Physical Exam  General:  elderly appearing, sitting in chair.  No respiratory difficulty HEENT: normal Neck: supple. JVD flat. Carotids 2+ bilat; no bruits. No lymphadenopathy or thyromegaly appreciated. Cor: PMI nondisplaced. Regular rate & rhythm. No rubs, gallops or murmurs. Lungs: diminished Abdomen: soft, nontender, nondistended. No hepatosplenomegaly. No bruits or masses. Good bowel sounds. Extremities: no cyanosis, clubbing, rash, edema  Neuro: alert & oriented x 3, cranial nerves grossly intact. moves all 4 extremities w/o difficulty. Affect  pleasant.   Telemetry   NSR 70s (Personally reviewed)    Labs    CBC Recent Labs    09/16/23 0216 09/18/23 0407  WBC 9.6 10.2  HGB 8.9* 8.8*  HCT 28.1* 28.7*  MCV 84.4 85.2  PLT 496* 552*   Basic Metabolic Panel Recent Labs    16/10/96 0500 09/18/23 0407  NA 136 140  K 3.6 4.1  CL 101 103  CO2 27 28  GLUCOSE 72 88  BUN 27* 25*  CREATININE 1.31* 1.17*  CALCIUM 8.2* 8.3*  MG 2.3 2.4    Imaging   No results found.  Medications:    Scheduled Medications:  sodium chloride   Intravenous Once   amiodarone  400 mg Oral BID   aspirin EC  81 mg Oral Daily   bisacodyl  10 mg Oral Daily   Or   bisacodyl  10 mg Rectal Daily   Chlorhexidine Gluconate Cloth  6 each Topical Daily   docusate sodium  200 mg Oral Daily   enoxaparin (LOVENOX) injection  40 mg Subcutaneous QHS   ezetimibe  10 mg Oral Daily   feeding supplement (GLUCERNA SHAKE)  237 mL Oral TID BM   furosemide  40 mg Oral Daily   gabapentin  100 mg Oral QHS   Gerhardt's butt cream   Topical QID   insulin aspart  0-20 Units Subcutaneous TID WC   insulin aspart  0-5 Units Subcutaneous QHS  insulin aspart  15 Units Subcutaneous TID WC   insulin glargine-yfgn  50 Units Subcutaneous BID   loratadine  10 mg Oral Daily   losartan  25 mg Oral Daily   pantoprazole  40 mg Oral Daily   polyethylene glycol  17 g Oral Daily   rosuvastatin  40 mg Oral Daily   sodium chloride flush  10-40 mL Intracatheter Q12H   sodium chloride flush  3 mL Intravenous Q12H   spironolactone  25 mg Oral Daily    Infusions:  albumin human Stopped (09/06/23 2310)    PRN Medications: acetaminophen, albumin human, ondansetron (ZOFRAN) IV, mouth rinse, oxyCODONE, sodium chloride flush, sodium chloride flush, traMADol  Patient Profile   77 y/o female w/ h/o CAD s/p remote LAD and RCA PCI in 2011, Type 2DM and HLD, recently diagnosed w/ new systolic heart failure and severe MR by echo. 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 AHF team consulted for HF optimization prior to CABG + MVR.  Assessment/Plan  1. Acute Systolic Heart Failure w/ Low Output  - Echo 2014 w/ normal EF 50-55%, normal RV - Echo 11/24 EF 25-30%, severe MR, RV low normal  - Ischemic CM. LHC w/ severe MVCAD.  - RHC c/w decompensated heart failure, severe LV dysfunction, and severe mitral regurgitation (RA 21, PAP 83/37/56, PCWP 36, CO 4.11, CI 2.04) - 11/19 RHC: RA 2, PA 45/17 (29), PCWP 11 vwaves to 18, CO/CI (thermo) 4.62/2.4, PVR 2w, PAPi 14 - TEE: EF 25%, RV severely reduced, severe LAE, 3-4+ MR - S/p CABG X 4 (LIMA to LAD, SVG to diagonal, SVG to OM, SVG to acute marginal) + MVR on 11/20 - Mixed venous 60% today; Diuresed great with IV lasix yesterday, down 5lbs. Will not add additional diuretic for now. Starting Farxiga 10 mg daily today.  - Continue spironolactone 25mg  today  - Start losartan 12.5 mg daily.   2. Severe MR  - appears ischemic w/ posterior leaflet tethering on echo. TEE 3-4+ MR - Now s/p MVR 11/20 at time of CABG on 11/20   3. Severe MVCAD  - Now s/p CABG X 4 (LIMA to LAD, SVG to diagonal, SVG to OM, SVG to acute marginal) on 11/20 - Aspirin + statin - No s/s angina - Continue to mobilize. Use IS.  4. PAF/atrial tach  - In SR today. - Now po amiodarone.   - Discussed with CTS, start Eliquis 5 mg BID today   5. Type 2DM  - Hgb A1c 7.8  - Insulin per TCTS  6. AKI:  -sCr continuing to improve; stable.   7. Acute blood loss anemia - Hgb stable.   8. L Pleural effusion - improved lung sounds but still diminished   Length of Stay: 21  Diana Bleacher, NP  09/18/2023, 9:44 AM  Advanced Heart Failure Team Pager [phone] (M-F; 7a - 5p)  Please contact [facility] for night-coverage after hours (5p -7a ) and weekends on [email or web address]  Patient seen with NP, agree with the above note.   No complaints today.  Walked already.    Echo today showed EF 30-35%, s/p MV repair with trivial MR and mean gradient 5 mmHg.   General: NAD Neck: JVP 8 cm, no thyromegaly or thyroid nodule.  Lungs: Clear to auscultation bilaterally with normal respiratory effort. CV: Nondisplaced PMI.  Heart regular S1/S2, no S3/S4, no murmur.  1+ edema 1/2 to knees bilaterally.  Abdomen:  Soft, nontender, no hepatosplenomegaly, no distention.  Skin: Intact without lesions or rashes.  Neurologic: Alert and oriented x 3.  Psych: Normal affect. Extremities: No clubbing or cyanosis.  HEENT: Normal.   I think she is going to need some Lasix, will use 40 mg po daily.  Add Farxiga 10 daily and losartan 12.5 daily.  Continue spironolactone 25 daily.   She is in NSR on amiodarone.  Starting Eliquis today for PAF.   Diana [name] 09/18/2023

## 2023-09-19 ENCOUNTER — Other Ambulatory Visit (HOSPITAL_COMMUNITY): Payer: Self-pay

## 2023-09-19 ENCOUNTER — Telehealth (HOSPITAL_COMMUNITY): Payer: Self-pay | Admitting: Pharmacy Technician

## 2023-09-19 DIAGNOSIS — I5023 Acute on chronic systolic (congestive) heart failure: Secondary | ICD-10-CM | POA: Diagnosis not present

## 2023-09-19 LAB — COOXEMETRY PANEL
Carboxyhemoglobin: 1.6 % — ABNORMAL HIGH (ref 0.5–1.5)
Methemoglobin: <0.7 % (ref 0.0–1.5)
O2 Saturation: 78.6 %
Total hemoglobin: 9.9 g/dL — ABNORMAL LOW (ref 12.0–16.0)

## 2023-09-19 LAB — BASIC METABOLIC PANEL
Anion gap: 9 (ref 5–15)
BUN: 24 mg/dL — ABNORMAL HIGH (ref 8–23)
CO2: 27 mmol/L (ref 22–32)
Calcium: 8.5 mg/dL — ABNORMAL LOW (ref 8.9–10.3)
Chloride: 100 mmol/L (ref 98–111)
Creatinine, Ser: 1.21 mg/dL — ABNORMAL HIGH (ref 0.44–1.00)
GFR, Estimated: 47 mL/min — ABNORMAL LOW (ref >60)
Glucose, Bld: 70 mg/dL (ref 70–99)
Potassium: 4.2 mmol/L (ref 3.5–5.1)
Sodium: 136 mmol/L (ref 135–145)

## 2023-09-19 LAB — GLUCOSE, CAPILLARY
Glucose-Capillary: 83 mg/dL (ref 70–99)
Glucose-Capillary: 226 mg/dL — ABNORMAL HIGH (ref 70–99)
Glucose-Capillary: 173 mg/dL — ABNORMAL HIGH (ref 70–99)
Glucose-Capillary: 80 mg/dL (ref 70–99)

## 2023-09-19 LAB — MAGNESIUM: Magnesium: 2.2 mg/dL (ref 1.7–2.4)

## 2023-09-19 MED ORDER — LOSARTAN POTASSIUM 25 MG PO TABS
12.5000 mg | ORAL_TABLET | Freq: Once | ORAL | Status: AC
  Administered 2023-09-19: 12.5 mg via ORAL
  Filled 2023-09-19: qty 1

## 2023-09-19 MED ORDER — LOSARTAN POTASSIUM 25 MG PO TABS
25.0000 mg | ORAL_TABLET | Freq: Every day | ORAL | Status: DC
  Administered 2023-09-20 – 2023-09-21 (×2): 25 mg via ORAL
  Filled 2023-09-19 (×2): qty 1

## 2023-09-19 MED ORDER — AMIODARONE HCL 200 MG PO TABS
200.0000 mg | ORAL_TABLET | Freq: Two times a day (BID) | ORAL | Status: DC
  Administered 2023-09-19 – 2023-09-20 (×2): 200 mg via ORAL
  Filled 2023-09-19 (×2): qty 1

## 2023-09-19 NOTE — Inpatient Diabetes Management (Signed)
Inpatient Diabetes Program Recommendations  AACE/ADA: New Consensus Statement on Inpatient Glycemic Control (2015)  Target Ranges:  Prepandial:   less than 140 mg/dL      Peak postprandial:   less than 180 mg/dL (1-2 hours)      Critically ill patients:  140 - 180 mg/dL   Lab Results  Component Value Date   GLUCAP 83 09/19/2023   HGBA1C 7.8 (H) 08/29/2023    Review of Glycemic Control  Diabetes history: Type 2 DM Outpatient Diabetes medications: Novolin 70/30 60 units BID, Metformin 1000 mg BID, ozempic 0.5 mg qwk Current orders for Inpatient glycemic control: Farxiga 10 mg every day, Semglee 50 units BID, novolog 15 units TID, novolog 0-20 units TID & HS  Inpatient Diabetes Program Recommendations:     Consider decreasing correction to novolog 0-9 units TID & HS  Thanks, Lujean Rave, MSN, RNC-OB Diabetes Coordinator [phone] (8a-5p)

## 2023-09-19 NOTE — Plan of Care (Signed)
  Problem: Education: Goal: Knowledge of General Education information will improve Description: Including pain rating scale, medication(s)/side effects and non-pharmacologic comfort measures Outcome: Progressing   Problem: Health Behavior/Discharge Planning: Goal: Ability to manage health-related needs will improve Outcome: Progressing   Problem: Clinical Measurements: Goal: Ability to maintain clinical measurements within normal limits will improve Outcome: Progressing Goal: Will remain free from infection Outcome: Progressing Goal: Diagnostic test results will improve Outcome: Progressing Goal: Respiratory complications will improve Outcome: Progressing Goal: Cardiovascular complication will be avoided Outcome: Progressing   Problem: Activity: Goal: Risk for activity intolerance will decrease Outcome: Progressing   Problem: Nutrition: Goal: Adequate nutrition will be maintained Outcome: Progressing   Problem: Coping: Goal: Level of anxiety will decrease Outcome: Progressing   Problem: Elimination: Goal: Will not experience complications related to bowel motility Outcome: Progressing Goal: Will not experience complications related to urinary retention Outcome: Progressing   Problem: Pain Management: Goal: General experience of comfort will improve Outcome: Progressing   Problem: Safety: Goal: Ability to remain free from injury will improve Outcome: Progressing   Problem: Skin Integrity: Goal: Risk for impaired skin integrity will decrease Outcome: Progressing   Problem: Education: Goal: Ability to describe self-care measures that may prevent or decrease complications (Diabetes Survival Skills Education) will improve Outcome: Progressing Goal: Individualized Educational Video(s) Outcome: Progressing   Problem: Coping: Goal: Ability to adjust to condition or change in health will improve Outcome: Progressing   Problem: Fluid Volume: Goal: Ability to  maintain a balanced intake and output will improve Outcome: Progressing   Problem: Health Behavior/Discharge Planning: Goal: Ability to identify and utilize available resources and services will improve Outcome: Progressing Goal: Ability to manage health-related needs will improve Outcome: Progressing   Problem: Metabolic: Goal: Ability to maintain appropriate glucose levels will improve Outcome: Progressing   Problem: Nutritional: Goal: Maintenance of adequate nutrition will improve Outcome: Progressing Goal: Progress toward achieving an optimal weight will improve Outcome: Progressing   Problem: Skin Integrity: Goal: Risk for impaired skin integrity will decrease Outcome: Progressing   Problem: Tissue Perfusion: Goal: Adequacy of tissue perfusion will improve Outcome: Progressing   Problem: Education: Goal: Will demonstrate proper wound care and an understanding of methods to prevent future damage Outcome: Progressing Goal: Knowledge of disease or condition will improve Outcome: Progressing Goal: Knowledge of the prescribed therapeutic regimen will improve Outcome: Progressing   Problem: Activity: Goal: Risk for activity intolerance will decrease Outcome: Progressing   Problem: Cardiac: Goal: Will achieve and/or maintain hemodynamic stability Outcome: Progressing   Problem: Clinical Measurements: Goal: Postoperative complications will be avoided or minimized Outcome: Progressing   Problem: Respiratory: Goal: Respiratory status will improve Outcome: Progressing   Problem: Skin Integrity: Goal: Wound healing without signs and symptoms of infection Outcome: Progressing Goal: Risk for impaired skin integrity will decrease Outcome: Progressing

## 2023-09-19 NOTE — Plan of Care (Signed)

## 2023-09-19 NOTE — Progress Notes (Signed)
Occupational Therapy Treatment Patient Details Name: Diana [name] MRN: [id number] DOB: [birth year] Today's Date: 09/19/2023   History of present illness 77 yo female admitted 11/11 for HF optimization pre CABG + MVR, R/LHC same date. 11/20 CABG x 4, MVR. 11/27 thoracentesis. PMhx: CHF, CAD, T2DM, HTN, obesity, HLD, COPD, neuropathy   OT comments  Pt progressing toward established OT goals. Continued to optimize education regarding sternal precautions. Pt with small amplitude motion and BUE tremor. Reviewed normalized movement patterns for ADL within precautions. Pt needing cues throughout. Reviewed toileting and LB ADL this session as well and pt with good recall of technique but continues to need assist for functional implementation of precautions and secondary to weakness. Pt reporting self feeding with AE has been going well. Will continue to follow and recommending discharge home with HHOT.       If plan is discharge home, recommend the following:  A little help with walking and/or transfers;A lot of help with bathing/dressing/bathroom;Assistance with cooking/housework;Assist for transportation;Help with stairs or ramp for entrance   Equipment Recommendations  None recommended by OT    Recommendations for Other Services      Precautions / Restrictions Precautions Precautions: Sternal;Fall Precaution Booklet Issued: No Restrictions Weight Bearing Restrictions: Yes Other Position/Activity Restrictions: sternal precautions       Mobility Bed Mobility               General bed mobility comments: In recliner on arrival and departure    Transfers Overall transfer level: Needs assistance Equipment used: Rollator (4 wheels) Transfers: Sit to/from Stand Sit to Stand: Contact guard assist, Min assist           General transfer comment: Pt with good compliance with sternal precautions. Needing min A up from low commode predominantly for steadying/balance.     Balance  Overall balance assessment: Needs assistance Sitting-balance support: No upper extremity supported, Feet supported Sitting balance-Leahy Scale: Good     Standing balance support: No upper extremity supported Standing balance-Leahy Scale: Fair Standing balance comment: Rollator in standing                           ADL either performed or assessed with clinical judgement   ADL Overall ADL's : Needs assistance/impaired     Grooming: Set up Grooming Details (indicate cue type and reason): washing hands         Upper Body Dressing : Moderate assistance;Sitting Upper Body Dressing Details (indicate cue type and reason): cues for optimal technique and appropriate amplitude of movement Lower Body Dressing: Moderate assistance;Sit to/from stand Lower Body Dressing Details (indicate cue type and reason): Mod A for socks to achieve full figure 4 and get sock started Toilet Transfer: Contact guard assist;Ambulation   Toileting- Clothing Manipulation and Hygiene: Supervision/safety;Contact guard assist Toileting - Clothing Manipulation Details (indicate cue type and reason): worked on appropriate weight shift to be able to reach between legs to bottom while seated     Functional mobility during ADLs: Contact guard assist;Rolling walker (2 wheels);Cueing for safety      Extremity/Trunk Assessment Upper Extremity Assessment Upper Extremity Assessment: Generalized weakness   Lower Extremity Assessment Lower Extremity Assessment: Defer to PT evaluation        Vision       Perception     Praxis Praxis Praxis-Other Comments: intention tremor with motor planning    Cognition Arousal: Alert Behavior During Therapy: WFL for tasks assessed/performed Overall Cognitive Status: Within  Functional Limits for tasks assessed                                 General Comments: slow processing; repetition of information needed        Exercises Exercises: Other  exercises Other Exercises Other Exercises: 15 BUE reciprocal forward punches with cues for appropriate amplitude of motion as if pushing arm forward into a shirt sleeve    Shoulder Instructions       General Comments VSS on RA. HR 80s at rest and 90s during mobility    Pertinent Vitals/ Pain       Pain Assessment Pain Assessment: Faces Faces Pain Scale: Hurts little more Pain Location: chest Pain Descriptors / Indicators: Discomfort Pain Intervention(s): Limited activity within patient's tolerance, Monitored during session  Home Living                                          Prior Functioning/Environment              Frequency  Min 1X/week        Progress Toward Goals  OT Goals(current goals can now be found in the care plan section)  Progress towards OT goals: Progressing toward goals  Acute Rehab OT Goals Patient Stated Goal: get better OT Goal Formulation: With patient Time For Goal Achievement: 09/24/23 Potential to Achieve Goals: Good ADL Goals Pt Will Perform Eating: with modified independence;sitting Pt Will Perform Grooming: with contact guard assist;standing Pt Will Perform Upper Body Dressing: with modified independence;sitting Pt Will Perform Lower Body Dressing: with supervision;sit to/from stand Pt Will Transfer to Toilet: ambulating;with supervision Pt Will Perform Tub/Shower Transfer: with min assist;Tub transfer;shower seat;ambulating Pt/caregiver will Perform Home Exercise Program: Both right and left upper extremity;With written HEP provided  Plan      Co-evaluation                 AM-PAC OT "6 Clicks" Daily Activity     Outcome Measure   Help from another person eating meals?: A Little Help from another person taking care of personal grooming?: A Little Help from another person toileting, which includes using toliet, bedpan, or urinal?: A Little Help from another person bathing (including washing, rinsing,  drying)?: A Lot Help from another person to put on and taking off regular upper body clothing?: A Lot Help from another person to put on and taking off regular lower body clothing?: A Lot 6 Click Score: 15    End of Session Equipment Utilized During Treatment: Gait belt;Rolling walker (2 wheels)  OT Visit Diagnosis: Unsteadiness on feet (R26.81);Muscle weakness (generalized) (M62.81);Other (comment)   Activity Tolerance Patient tolerated treatment well   Patient Left in chair;with call bell/phone within reach   Nurse Communication Mobility status        Time: 1610-9604 OT Time Calculation (min): 30 min  Charges: OT General Charges $OT Visit: 1 Visit OT Treatments $Self Care/Home Management : 8-22 mins $Therapeutic Exercise: 8-22 mins  Tyler Deis, OTR/L [facility] Acute Rehabilitation Office: [phone]   Myrla Halsted 09/19/2023, 9:21 AM

## 2023-09-19 NOTE — Progress Notes (Addendum)
Advanced Heart Failure Rounding Note  PCP-Cardiologist: Nicki Guadalajara, MD   Subjective:   11/11: R/LHC severe MVCAD, RA 21, PAP 83/37/56, PCWP 36, CO 4.11, CI 2.04>>started on milrinone + IV Lasix.  11/13: AFL w/ RVR. Started on IV amio + heparin gtt. Milrinone discontinued    11/14: TEE: EF 25%, RV severely reduced, severe LAE, 3-4+ MR 11/19: RHC- RA 2, PA 45/17 (29), PCWP 11 vwaves to 18, CO/CI (thermo) 4.62/2.4, PVR 2w, PAPi 14 11/20: s/p CABG x4  (LIMA to LAD, SVG to diagonal, SVG to OM, SVG to acute marginal) + MVR by Dr. Dorris Fetch  POD #13  Fatigued and chest is sore after walking several times yesterday. No dyspnea.   Objective:    Weight Range: 93 kg Body mass index is 35.21 kg/m.   Vital Signs:   Temp:  [97.7 F (36.5 C)-98.9 F (37.2 C)] 98.7 F (37.1 C) (12/03 0757) Pulse Rate:  [72-86] 86 (12/03 0800) Resp:  [20-31] 31 (12/03 0800) BP: (102-150)/(52-81) 121/54 (12/03 0800) SpO2:  [84 %-100 %] 92 % (12/03 0800) Weight:  [93 kg] 93 kg (12/03 0536) Last BM Date : 09/18/23  Weight change: Filed Weights   09/18/23 0608 09/18/23 0624 09/19/23 0536  Weight: 95.3 kg 93.2 kg 93 kg    Intake/Output:   Intake/Output Summary (Last 24 hours) at 09/19/2023 0935 Last data filed at 09/19/2023 0800 Gross per 24 hour  Intake 1400 ml  Output 1050 ml  Net 350 ml    Physical Exam  General:  Well appearing elderly female. Sitting up in chair. HEENT: normal Neck: supple. no JVD.  Cor: PMI nondisplaced. Regular rate & rhythm. No rubs, gallops or murmurs. Sternum stable. Lungs: diminished left base Abdomen: soft, nontender, nondistended.  Extremities: no cyanosis, clubbing, rash, edema Neuro: alert & orientedx3, cranial nerves grossly intact. moves all 4 extremities w/o difficulty. Affect pleasant    Telemetry   SR 80s  Labs    CBC Recent Labs    09/18/23 0407  WBC 10.2  HGB 8.8*  HCT 28.7*  MCV 85.2  PLT 552*   Basic Metabolic Panel Recent Labs     09/18/23 0407 09/19/23 0436  NA 140 136  K 4.1 4.2  CL 103 100  CO2 28 27  GLUCOSE 88 70  BUN 25* 24*  CREATININE 1.17* 1.21*  CALCIUM 8.3* 8.5*  MG 2.4 2.2    Imaging   ECHOCARDIOGRAM LIMITED  Result Date: 09/18/2023    ECHOCARDIOGRAM LIMITED REPORT   Patient Name:   Diana [name] Date of Exam: 09/18/2023 Medical Rec #:  [id number]       Height:       64.0 in Accession #:    [id number]      Weight:       205.5 lb Date of Birth:  09/05/1946       BSA:          1.979 m Patient Age:    77 years        BP:           102/52 mmHg Patient Gender: F               HR:           81 bpm. Exam Location:  Inpatient Procedure: Limited Echo, Limited Color Doppler and Intracardiac Opacification            Agent Indications:    Cardiomyopathy- Unspecified I42.9  History:  Patient has prior history of Echocardiogram examinations, most                 recent 08/24/2023. CHF, CAD, Prior CABG, COPD; Risk                 Factors:Hypertension, Diabetes and Dyslipidemia. Annuplasty                 ring, size 30mm.                  Mitral Valve: 30 mm Kem Boroughs prosthetic annuloplasty ring                 valve is present in the mitral position. Procedure Date:                 09/06/2023.  Sonographer:    Lucendia Herrlich RCS Referring Phys: [phone] ADITYA SABHARWAL IMPRESSIONS  1. Limited echo for cardiomyopathy  2. Left ventricular ejection fraction, by estimation, is 30 to 35%. Left ventricular ejection fraction by 2D MOD biplane is 34.5 %. The left ventricle has moderately decreased function. The left ventricle demonstrates global hypokinesis. The left ventricular internal cavity size was mildly to moderately dilated.  3. The mitral valve has been repaired/replaced. Trivial mitral valve regurgitation. Mild mitral stenosis. The mean mitral valve gradient is 5.0 mmHg with average heart rate of 79 bpm. There is a 30 mm Kem Boroughs prosthetic annuloplasty ring present in the mitral position. Procedure  Date: 09/06/2023. Echo findings are consistent with normal structure and function of the mitral valve prosthesis.  4. Right ventricular systolic function is severely reduced. The right ventricular size is mildly enlarged. There is normal pulmonary artery systolic pressure. The estimated right ventricular systolic pressure is 25.5 mmHg.  5. The inferior vena cava is dilated in size with >50% respiratory variability, suggesting right atrial pressure of 8 mmHg. Comparison(s): Changes from prior study are noted. 08/24/2023: LVEF 25-30%. FINDINGS  Left Ventricle: Left ventricular ejection fraction, by estimation, is 30 to 35%. Left ventricular ejection fraction by 2D MOD biplane is 34.5 %. The left ventricle has moderately decreased function. The left ventricle demonstrates global hypokinesis. Definity contrast agent was given IV to delineate the left ventricular endocardial borders. The left ventricular internal cavity size was mildly to moderately dilated. There is no left ventricular hypertrophy. Abnormal (paradoxical) septal motion consistent with post-operative status. Right Ventricle: The right ventricular size is mildly enlarged. No increase in right ventricular wall thickness. Right ventricular systolic function is severely reduced. There is normal pulmonary artery systolic pressure. The tricuspid regurgitant velocity is 2.09 m/s, and with an assumed right atrial pressure of 8 mmHg, the estimated right ventricular systolic pressure is 25.5 mmHg. Mitral Valve: The mitral valve has been repaired/replaced. Trivial mitral valve regurgitation. There is a 30 mm Kem Boroughs prosthetic annuloplasty ring present in the mitral position. Procedure Date: 09/06/2023. Echo findings are consistent with normal structure and function of the mitral valve prosthesis. Mild mitral valve stenosis. MV peak gradient, 10.1 mmHg. The mean mitral valve gradient is 5.0 mmHg with average heart rate of 79 bpm. Venous: The inferior vena cava  is dilated in size with greater than 50% respiratory variability, suggesting right atrial pressure of 8 mmHg. LEFT VENTRICLE PLAX 2D                        Biplane EF (MOD) LVIDd:         5.70 cm  LV Biplane EF:   Left LVIDs:         5.10 cm                          ventricular LV PW:         1.10 cm                          ejection LV IVS:        0.90 cm                          fraction by LVOT diam:     1.70 cm                          2D MOD LVOT Area:     2.27 cm                         biplane is                                                 34.5 %.  LV Volumes (MOD) LV vol d, MOD    200.0 ml A2C: LV vol d, MOD    166.0 ml A4C: LV vol s, MOD    119.0 ml A2C: LV vol s, MOD    119.0 ml A4C: LV SV MOD A2C:   81.0 ml LV SV MOD A4C:   166.0 ml LV SV MOD BP:    63.1 ml IVC IVC diam: 1.80 cm LEFT ATRIUM         Index LA diam:    4.70 cm 2.38 cm/m   AORTA Ao Root diam: 2.40 cm Ao Asc diam:  3.30 cm MITRAL VALVE              TRICUSPID VALVE MV Peak grad: 10.1 mmHg   TR Peak grad:   17.5 mmHg MV Mean grad: 5.0 mmHg    TR Vmax:        209.00 cm/s MV Vmax:      1.59 m/s MV Vmean:     99.6 cm/s   SHUNTS MR Peak grad: 28.3 mmHg   Systemic Diam: 1.70 cm MR Vmax:      266.00 cm/s Zoila Shutter MD Electronically signed by Zoila Shutter MD Signature Date/Time: 09/18/2023/3:08:23 PM    Final     Medications:    Scheduled Medications:  sodium chloride   Intravenous Once   amiodarone  400 mg Oral BID   apixaban  5 mg Oral BID   aspirin EC  81 mg Oral Daily   bisacodyl  10 mg Oral Daily   Or   bisacodyl  10 mg Rectal Daily   Chlorhexidine Gluconate Cloth  6 each Topical Daily   dapagliflozin propanediol  10 mg Oral Daily   docusate sodium  200 mg Oral Daily   ezetimibe  10 mg Oral Daily   feeding supplement (GLUCERNA SHAKE)  237 mL Oral TID BM   furosemide  40 mg Oral Daily   gabapentin  100 mg Oral QHS   Gerhardt's butt cream   Topical QID   insulin aspart  0-20 Units Subcutaneous TID WC   insulin  aspart  0-5 Units Subcutaneous QHS   insulin aspart  15 Units Subcutaneous TID WC   insulin glargine-yfgn  50 Units Subcutaneous BID   loratadine  10 mg Oral Daily   losartan  12.5 mg Oral Daily   pantoprazole  40 mg Oral Daily   polyethylene glycol  17 g Oral Daily   potassium chloride  20 mEq Oral Daily   rosuvastatin  40 mg Oral Daily   sodium chloride flush  10-40 mL Intracatheter Q12H   sodium chloride flush  3 mL Intravenous Q12H   spironolactone  25 mg Oral Daily    Infusions:  albumin human Stopped (09/06/23 2310)    PRN Medications: acetaminophen, albumin human, ondansetron (ZOFRAN) IV, mouth rinse, oxyCODONE, sodium chloride flush, sodium chloride flush, traMADol  Patient Profile   77 y/o female w/ h/o CAD s/p remote LAD and RCA PCI in 2011, Type 2DM and HLD, recently diagnosed w/ new systolic heart failure and severe MR by echo. 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 AHF team consulted for HF optimization prior to CABG + MVR.  Assessment/Plan  1. Acute Systolic Heart Failure w/ Low Output  - Echo 2014 w/ normal EF 50-55%, normal RV - Echo 11/24 EF 25-30%, severe MR, RV low normal  - Ischemic CM. LHC w/ severe MVCAD.  - RHC c/w decompensated heart failure, severe LV dysfunction, and severe mitral regurgitation (RA 21, PAP 83/37/56, PCWP 36, CO 4.11, CI 2.04) - 11/19 RHC: RA 2, PA 45/17 (29), PCWP 11 vwaves to 18, CO/CI (thermo) 4.62/2.4, PVR 2w, PAPi 14 - TEE: EF 25%, RV severely reduced, severe LAE, 3-4+ MR - S/p CABG X 4 (LIMA to LAD, SVG to diagonal, SVG to OM, SVG to acute marginal) + MVR on 11/20 Echo 12/02: EF 30-35%, s/p MV repair with trivial MR and mean gradient 5 mmHg.  -CO-OX 79%. Off inotrope support.  - Continue lasix 40 mg daily - Continue farxiga 10 mg daily, arranging grant through HF clinic - Continue spironolactone 25mg  today  - Increase losartan to 25 mg  daily - Will arrange follow-up in HF clinic.  2. Severe MR  - appears ischemic w/ posterior leaflet tethering on echo. TEE 3-4+ MR - Now s/p MVR 11/20 at time of CABG on 11/20   3. Severe MVCAD  - Now s/p CABG X 4 (LIMA to LAD, SVG to diagonal, SVG to OM, SVG to acute marginal) on 11/20 - Aspirin + statin - No s/s angina  4. PAF/atrial tach  - In SR today. - Decrease po amiodarone to 200 mg BID - Continue Eliquis 5 BID   5. Type 2DM  - Hgb A1c 7.8  - Insulin per TCTS  6. AKI:  -sCr improved to 1.2  7. Acute blood loss anemia - Hgb stable at 8.8 yesterday  8. L Pleural effusion - improved lung sounds    Length of Stay: 22  Diana [name], Diana [name], Diana [name]  09/19/2023, 9:35 AM  Advanced Heart Failure Team Pager [phone] (M-F; 7a - 5p)  Please contact [facility] for night-coverage after hours (5p -7a ) and weekends on [email or web address]  Agree with the above PA note.   Co-ox 79%, BP stable. Feels weak.   General: NAD Neck: No JVD, no thyromegaly or thyroid nodule.  Lungs: Clear to auscultation bilaterally with normal respiratory effort. CV: Nondisplaced PMI.  Heart regular S1/S2, no S3/S4, no murmur.  No peripheral edema.   Abdomen:  Soft, nontender, no hepatosplenomegaly, no distention.  Skin: Intact without lesions or rashes.  Neurologic: Alert and oriented x 3.  Psych: Normal affect. Extremities: No clubbing or cyanosis.  HEENT: Normal.   As above, increase losartan to 25 mg daily. Continue po diuretic, volume looks k.   Can decrease amiodarone as above.   Work with PT/OT.   Diana [name] 09/19/2023

## 2023-09-19 NOTE — Progress Notes (Addendum)
      [address]       [place] [address]             [phone]        13 Days Post-Op Procedure(s) (LRB): CORONARY ARTERY BYPASS GRAFTING (CABG) TIMES FOUR USING LEFT INTERNAL MAMMARY ARTERY AND RIGHT GREATER SAPHENOUS VEIN, HARVESTED ENDOSCOPICALLY (N/A) MITRAL VALVE REPAIR USING CARPENTIER-MCCARTHY-ADAMS ANNUOPLASTY RING (N/A) TRANSESOPHAGEAL ECHOCARDIOGRAM (N/A)  Subjective: Patient had bowel movement yesterday. She thinks her breathing is doing better.  Objective: Vital signs in last 24 hours: Temp:  [97.6 F (36.4 C)-98.9 F (37.2 C)] 98.9 F (37.2 C) (12/03 0536) Pulse Rate:  [72-88] 85 (12/03 0536) Cardiac Rhythm: Normal sinus rhythm (12/03 0430) Resp:  [20-25] 20 (12/03 0536) BP: (102-150)/(52-81) 150/81 (12/03 0536) SpO2:  [84 %-100 %] 97 % (12/03 0536) Weight:  [93 kg] 93 kg (12/03 0536)  Pre op weight 92.4 kg Current Weight  09/19/23 93 kg      Intake/Output from previous day: 12/02 0701 - 12/03 0700 In: 1800 [P.O.:1800] Out: 1050 [Urine:1050]   Physical Exam:  Cardiovascular: RRR Pulmonary: Slightly diminished bibasilar breath sounds Abdomen: Soft, obese, non tender, bowel sounds present. Extremities: Trace bilateral lower extremity edema. Wounds: Clean and dry.  No erythema or signs of infection.  Lab Results: CBC: Recent Labs    09/18/23 0407  WBC 10.2  HGB 8.8*  HCT 28.7*  PLT 552*   BMET:  Recent Labs    09/18/23 0407 09/19/23 0436  NA 140 136  K 4.1 4.2  CL 103 100  CO2 28 27  GLUCOSE 88 70  BUN 25* 24*  CREATININE 1.17* 1.21*  CALCIUM 8.3* 8.5*    PT/INR:  Lab Results  Component Value Date   INR 1.5 (H) 09/06/2023   INR 1.0 09/06/2023   INR 0.89 05/17/2010   ABG:  INR: Will add last result for INR, ABG once components are confirmed Will add last 4 CBG results once components are confirmed  Assessment/Plan:  1. CV - Previous a fib. Maintaining SR, first degree heart block. Hypertensive  this am. Co ox 78.6. On Amiodarone 400 mg bid, Losartan 12.5 mg daily, and Apixaban 2.  Pulmonary - On room air.  Encourage incentive spirometer 3. Acute systolic heart failure with low output-on Farxiga and Spironolactone. AHF following. 4.  Expected post op acute blood loss anemia - Last H and H 8.8 and 28.7 5. DM-CBGs 185/116/83. On Insulin. Pre op HGA1C 7.8. Will restart Metformin at discharge if creatinine ok 6. AKI-Creatinine slightly increased from 1.17 to 1.21 7. Deconditioned-continue PT/OT, HH ordered  Donielle M ZimmermanPA-C 7:03 AM Patient seen and examined, agree with findings and plan outlined above  Viviann Spare C. Dorris Fetch, MD [facility] [phone]

## 2023-09-19 NOTE — Telephone Encounter (Signed)
Advanced Heart Failure Patient Advocate Encounter  The patient was approved for a [employer] grant that will help cover the cost of Farxiga, Coreg. Total amount awarded, $10,000. Eligibility, 08/20/23 - 08/18/24.  ID [id number]  BIN [id number]  PCN [id number]  Group [id number]  Billing information added to WAM.   Archer Asa, CPhT

## 2023-09-19 NOTE — Progress Notes (Addendum)
CARDIAC REHAB PHASE I   PRE:  Rate/Rhythm: 84 NSR  BP:  Sitting: 126/54      SpO2: 97 RA  MODE:  Ambulation: 136 (68 ft before taking sitting rest break) ft    POST:  Rate/Rhythm: 97 NSR  BP:  Sitting: 145/63      SpO2: 97 RA  Pt ambulated with RW requiring contact guard assistance using gait belt d/t shakiness. Pt able to stand after two attempts with min assistance while following sternal precautions. Pt reports SOB after walking, SpO2 normal. Pt returned to chair, will educate later if time allows.   Service time is from [phone] to 1025.  F/U at 1430 Pt received OHS book and education on restrictions, heart healthy diet, ex guidelines, Move in the Tube sheet, incentive spirometer use when d/c and CRPII. Pt denies questions and was encouraged to look in the book for additional information. Referral placed to [facility].    Faustino Congress  MS, ACSM-CEP 10:25 AM 09/19/2023

## 2023-09-19 NOTE — Consult Note (Signed)
WOC Nurse Consult Note: Reason for Consult: Requested to assess a DPI on buttlocks. Wound type: 2 wounds in the mid line stage 3, and 1 deep pressure tissue injury. Pressure Injury POA: No Measurement: 1 - stage 3 - 0.5X0.5X.1 Stage 3 right buttlock - 0.5X0.5X.1  Wound bed: Stage 3 - 100% yellow Periwound intact. Dressing procedure/placement/frequency: Applied Xeroform on the wounds. Change Daily. Cover with Foam dressing, change every 3 days.   Our team will assess weekly.   Please reconsult if further assistance is needed. Thank-you,  Denyse Amass BSN, RN, [employer], WOC  (Pager: [phone])

## 2023-09-20 DIAGNOSIS — I5023 Acute on chronic systolic (congestive) heart failure: Secondary | ICD-10-CM | POA: Diagnosis not present

## 2023-09-20 LAB — BASIC METABOLIC PANEL
Anion gap: 7 (ref 5–15)
BUN: 23 mg/dL (ref 8–23)
CO2: 28 mmol/L (ref 22–32)
Calcium: 8.2 mg/dL — ABNORMAL LOW (ref 8.9–10.3)
Chloride: 99 mmol/L (ref 98–111)
Creatinine, Ser: 1.18 mg/dL — ABNORMAL HIGH (ref 0.44–1.00)
GFR, Estimated: 48 mL/min — ABNORMAL LOW (ref >60)
Glucose, Bld: 69 mg/dL — ABNORMAL LOW (ref 70–99)
Potassium: 4.4 mmol/L (ref 3.5–5.1)
Sodium: 134 mmol/L — ABNORMAL LOW (ref 135–145)

## 2023-09-20 LAB — GLUCOSE, CAPILLARY
Glucose-Capillary: 57 mg/dL — ABNORMAL LOW (ref 70–99)
Glucose-Capillary: 87 mg/dL (ref 70–99)
Glucose-Capillary: 205 mg/dL — ABNORMAL HIGH (ref 70–99)
Glucose-Capillary: 240 mg/dL — ABNORMAL HIGH (ref 70–99)
Glucose-Capillary: 120 mg/dL — ABNORMAL HIGH (ref 70–99)

## 2023-09-20 LAB — COOXEMETRY PANEL
Carboxyhemoglobin: 2 % — ABNORMAL HIGH (ref 0.5–1.5)
Methemoglobin: <0.7 % (ref 0.0–1.5)
O2 Saturation: 54.5 %
Total hemoglobin: 9.5 g/dL — ABNORMAL LOW (ref 12.0–16.0)

## 2023-09-20 LAB — MAGNESIUM: Magnesium: 2.4 mg/dL (ref 1.7–2.4)

## 2023-09-20 MED ORDER — INSULIN ASPART 100 UNIT/ML IJ SOLN
10.0000 [IU] | Freq: Three times a day (TID) | INTRAMUSCULAR | Status: DC
  Administered 2023-09-20 – 2023-09-21 (×2): 10 [IU] via SUBCUTANEOUS

## 2023-09-20 MED ORDER — INSULIN GLARGINE-YFGN 100 UNIT/ML ~~LOC~~ SOLN
45.0000 [IU] | Freq: Two times a day (BID) | SUBCUTANEOUS | Status: DC
Start: 2023-09-20 — End: 2023-09-21
  Administered 2023-09-20 (×2): 45 [IU] via SUBCUTANEOUS
  Filled 2023-09-20 (×4): qty 0.45

## 2023-09-20 MED ORDER — AMIODARONE HCL 200 MG PO TABS
200.0000 mg | ORAL_TABLET | Freq: Every day | ORAL | Status: DC
  Administered 2023-09-21: 200 mg via ORAL
  Filled 2023-09-20: qty 1

## 2023-09-20 NOTE — Progress Notes (Addendum)
Mobility Specialist Progress Note:   09/20/23 1049  Mobility  Activity Ambulated with assistance in hallway  Level of Assistance Contact guard assist, steadying assist  Assistive Device Four wheel walker  Distance Ambulated (ft) 230 ft (15ft+167ft)  RUE Weight Bearing NWB  LUE Weight Bearing NWB  Activity Response Tolerated well  Mobility Referral Yes  $Mobility charge 1 Mobility  Mobility Specialist Start Time (ACUTE ONLY) 0915  Mobility Specialist Stop Time (ACUTE ONLY) 0927  Mobility Specialist Time Calculation (min) (ACUTE ONLY) 12 min   During Mobility: 96 HR , 88%-92% SpO2 RA Post Mobility: 88 HR  Pt received in chair, agreeable to mobility. Pt needing only CG to stand within precautions. MinG during ambulation. Seated rest break required d/t fatigue. Pt displayed tremors during ambulation only when fatigued. After recovery pt able to ambulate additional 167 ft. w/o fault. VSS throughout. Pt returned to chair with call bell in reach and all needs met.   Leory Plowman  Mobility Specialist Please contact via [employer] office at [phone]

## 2023-09-20 NOTE — Progress Notes (Addendum)
      [address]       [place] [address]             [phone]        14 Days Post-Op Procedure(s) (LRB): CORONARY ARTERY BYPASS GRAFTING (CABG) TIMES FOUR USING LEFT INTERNAL MAMMARY ARTERY AND RIGHT GREATER SAPHENOUS VEIN, HARVESTED ENDOSCOPICALLY (N/A) MITRAL VALVE REPAIR USING CARPENTIER-MCCARTHY-ADAMS ANNUOPLASTY RING (N/A) TRANSESOPHAGEAL ECHOCARDIOGRAM (N/A)  Subjective: Patient sitting in chair. I spoke with daughter on the phone. Patient would like to walk more.  Objective: Vital signs in last 24 hours: Temp:  [98 F (36.7 C)-99 F (37.2 C)] 99 F (37.2 C) (12/04 0415) Pulse Rate:  [69-86] 81 (12/04 0415) Cardiac Rhythm: Normal sinus rhythm (12/03 2100) Resp:  [20-31] 20 (12/04 0415) BP: (100-127)/(50-65) 127/56 (12/04 0415) SpO2:  [90 %-96 %] 90 % (12/04 0415) Weight:  [93.8 kg] 93.8 kg (12/04 0640)  Pre op weight 92.4 kg Current Weight  09/20/23 93.8 kg      Intake/Output from previous day: 12/03 0701 - 12/04 0700 In: 200 [P.O.:200] Out: -    Physical Exam:  Cardiovascular: RRR Pulmonary: Slightly diminished bibasilar breath sounds Abdomen: Soft, obese, non tender, bowel sounds present. Extremities: Trace bilateral lower extremity edema. Wounds: Clean and dry.  No erythema or signs of infection.  Lab Results: CBC: Recent Labs    09/18/23 0407  WBC 10.2  HGB 8.8*  HCT 28.7*  PLT 552*   BMET:  Recent Labs    09/19/23 0436 09/20/23 0402  NA 136 134*  K 4.2 4.4  CL 100 99  CO2 27 28  GLUCOSE 70 69*  BUN 24* 23  CREATININE 1.21* 1.18*  CALCIUM 8.5* 8.2*    PT/INR:  Lab Results  Component Value Date   INR 1.5 (H) 09/06/2023   INR 1.0 09/06/2023   INR 0.89 05/17/2010   ABG:  INR: Will add last result for INR, ABG once components are confirmed Will add last 4 CBG results once components are confirmed  Assessment/Plan:  1. CV - Previous a fib. Maintaining SR, first degree heart block. Hypertensive  this am. Co ox 54.4 (has been off Milrinone). On Amiodarone 200 mg bid, Losartan 25 mg daily, and Apixaban 2.  Pulmonary - On room air.  Encourage incentive spirometer 3. Acute systolic heart failure with low output-on Lasix 40 mg daily, Farxiga 10 mg daily, and Spironolactone 25 mg daily. AHF following. 4.  Expected post op acute blood loss anemia - Last H and H 8.8 and 28.7 5. DM-CBGs 80/57/87. On Insulin but will decrease meal coverage and scheduled Insulin to avoid further hypoglycemia. Pre op HGA1C 7.8. Will restart Metformin at discharge if creatinine ok 6. AKI-Creatinine slightly increased from 1.21 to 1.18 (creatinine 0.86 on admission). 7. Deconditioned-continue PT/OT, HH ordered 8. Disposition-AHF titrated a couple of medications yesterday. Will discharge when ok with AHF (medications optimized) and ambulation improved  Donielle Margaretann Loveless PA-C 6:59 AM Patient seen and examined, agree with findings and plan outlined above Stable from a surgical perspective, home when OK with AHF team  Viviann Spare C. Dorris Fetch, MD [facility] [phone]

## 2023-09-20 NOTE — Telephone Encounter (Signed)
error 

## 2023-09-20 NOTE — Progress Notes (Addendum)
Advanced Heart Failure Rounding Note  PCP-Cardiologist: Nicki Guadalajara, MD   Subjective:   11/11: R/LHC severe MVCAD, RA 21, PAP 83/37/56, PCWP 36, CO 4.11, CI 2.04>>started on milrinone + IV Lasix.  11/13: AFL w/ RVR. Started on IV amio + heparin gtt. Milrinone discontinued    11/14: TEE: EF 25%, RV severely reduced, severe LAE, 3-4+ MR 11/19: RHC- RA 2, PA 45/17 (29), PCWP 11 vwaves to 18, CO/CI (thermo) 4.62/2.4, PVR 2w, PAPi 14 11/20: s/p CABG x4  (LIMA to LAD, SVG to diagonal, SVG to OM, SVG to acute marginal) + MVR by Dr. Dorris Fetch  POD #14  CO-OX 54.5 with estimated Fick CI of 2.5. Off milrinone.  Has been up walking with mobility. Notes some fatigue but no dyspnea.   Objective:    Weight Range: 93.8 kg Body mass index is 35.5 kg/m.   Vital Signs:   Temp:  [97.9 F (36.6 C)-99 F (37.2 C)] 97.9 F (36.6 C) (12/04 0750) Pulse Rate:  [69-92] 92 (12/04 0854) Resp:  [20-22] 20 (12/04 0854) BP: (99-127)/(50-70) 107/70 (12/04 0854) SpO2:  [90 %-96 %] 93 % (12/04 0854) Weight:  [93.8 kg] 93.8 kg (12/04 0640) Last BM Date : 09/18/23  Weight change: Filed Weights   09/18/23 0624 09/19/23 0536 09/20/23 0640  Weight: 93.2 kg 93 kg 93.8 kg    Intake/Output:  No intake or output data in the 24 hours ending 09/20/23 0904   Physical Exam  General:  Well appearing. Sitting up in chair. HEENT: normal Neck: supple. no JVD. Carotids 2+ bilat; no bruits.  Cor: PMI nondisplaced. Regular rate & rhythm. No rubs, gallops or murmurs. Lungs: clear Abdomen: obese, soft, nontender, nondistended.  Extremities: no cyanosis, clubbing, rash, edema Neuro: alert & orientedx3. Affect pleasant   Telemetry   SR 80s-90s  Labs    CBC Recent Labs    09/18/23 0407  WBC 10.2  HGB 8.8*  HCT 28.7*  MCV 85.2  PLT 552*   Basic Metabolic Panel Recent Labs    69/62/95 0436 09/20/23 0402  NA 136 134*  K 4.2 4.4  CL 100 99  CO2 27 28  GLUCOSE 70 69*  BUN 24* 23   CREATININE 1.21* 1.18*  CALCIUM 8.5* 8.2*  MG 2.2 2.4    Imaging   No results found.  Medications:    Scheduled Medications:  sodium chloride   Intravenous Once   amiodarone  200 mg Oral BID   apixaban  5 mg Oral BID   aspirin EC  81 mg Oral Daily   bisacodyl  10 mg Oral Daily   Or   bisacodyl  10 mg Rectal Daily   Chlorhexidine Gluconate Cloth  6 each Topical Daily   dapagliflozin propanediol  10 mg Oral Daily   docusate sodium  200 mg Oral Daily   ezetimibe  10 mg Oral Daily   feeding supplement (GLUCERNA SHAKE)  237 mL Oral TID BM   furosemide  40 mg Oral Daily   gabapentin  100 mg Oral QHS   Gerhardt's butt cream   Topical QID   insulin aspart  0-20 Units Subcutaneous TID WC   insulin aspart  0-5 Units Subcutaneous QHS   insulin aspart  10 Units Subcutaneous TID WC   insulin glargine-yfgn  45 Units Subcutaneous BID   loratadine  10 mg Oral Daily   losartan  25 mg Oral Daily   pantoprazole  40 mg Oral Daily   polyethylene glycol  17 g  Oral Daily   potassium chloride  20 mEq Oral Daily   rosuvastatin  40 mg Oral Daily   sodium chloride flush  10-40 mL Intracatheter Q12H   sodium chloride flush  3 mL Intravenous Q12H   spironolactone  25 mg Oral Daily    Infusions:  albumin human Stopped (09/06/23 2310)    PRN Medications: acetaminophen, albumin human, ondansetron (ZOFRAN) IV, mouth rinse, oxyCODONE, sodium chloride flush, sodium chloride flush, traMADol  Patient Profile   77 y/o female w/ h/o CAD s/p remote LAD and RCA PCI in 2011, Type 2DM and HLD, recently diagnosed w/ new systolic heart failure and severe MR by echo. 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 AHF team consulted for HF optimization prior to CABG + MVR.  Assessment/Plan  1. Acute Systolic Heart Failure w/ Low Output  - Echo 2014 w/ normal EF 50-55%, normal RV - Echo 11/24 EF 25-30%, severe MR, RV low  normal  - Ischemic CM. LHC w/ severe MVCAD.  - RHC c/w decompensated heart failure, severe LV dysfunction, and severe mitral regurgitation (RA 21, PAP 83/37/56, PCWP 36, CO 4.11, CI 2.04) - 11/19 RHC: RA 2, PA 45/17 (29), PCWP 11 vwaves to 18, CO/CI (thermo) 4.62/2.4, PVR 2w, PAPi 14 - TEE: EF 25%, RV severely reduced, severe LAE, 3-4+ MR - S/p CABG X 4 (LIMA to LAD, SVG to diagonal, SVG to OM, SVG to acute marginal) + MVR on 11/20 Echo 12/02: EF 30-35%, s/p MV repair with trivial MR and mean gradient 5 mmHg.  - CO-OX 54.5% with estimated Fick CI of 2.5 off milrinone. - Continue lasix 40 mg daily - Continue farxiga 10 mg daily, arranging grant through HF clinic - Continue spironolactone 25mg  today  - Continue Losartan 25 mg daily  2. Severe MR  - appears ischemic w/ posterior leaflet tethering on echo. TEE 3-4+ MR - Now s/p MVR 11/20 at time of CABG on 11/20   3. Severe MVCAD  - Now s/p CABG X 4 (LIMA to LAD, SVG to diagonal, SVG to OM, SVG to acute marginal) on 11/20 - Aspirin + statin - No s/s angina  4. PAF/atrial tach  - In SR today. - Decrease po amiodarone to 200 mg daily - Continue Eliquis 5 BID   5. Type 2DM  - Hgb A1c 7.8  - Insulin per TCTS  6. AKI:  -sCr improved to 1.2  7. Acute blood loss anemia - Hgb stable at 8.8 a couple of days ago  8. L Pleural effusion - improved lung sounds   9. Tremor -She reports she had tremor chronically, but worse post-op. Suspect related to surgery but has also received a large amount of amiodarone this admission.  -Decrease amiodarone to 200 mg daily as above  Has follow-up scheduled in HF clinic.  Okay for discharge from HF standpoint. Sounds like discharge is also pending improved mobility.  Okay to remove PICC line.   Length of Stay: [address]  [facility], LINDSAY N, PA-C  09/20/2023, 9:04 AM  Advanced Heart Failure Team Pager [phone] (M-F; 7a - 5p)  Please contact [facility] for night-coverage after hours (5p -7a ) and  weekends on [email or web address]  Patient seen with PA, agree with the above note.   Has been walking without significant dyspnea.  Creatinine stable.  Co-ox 55%.  She has a baseline essential tremor that has significantly worsened in the hospital.  She is on amiodarone, remains in  NSR.   General: NAD Neck: No JVD, no thyromegaly or thyroid nodule.  Lungs: Clear to auscultation bilaterally with normal respiratory effort. CV: Nondisplaced PMI.  Heart regular S1/S2, no S3/S4, no murmur.  Trace ankle edema.  Abdomen: Soft, nontender, no hepatosplenomegaly, no distention.  Skin: Intact without lesions or rashes.  Neurologic: Alert and oriented x 3.  Psych: Normal affect. Extremities: No clubbing or cyanosis.  HEENT: Normal.   Volume status looks ok at this point, continue Lasix 40 mg daily.   BP soft at times, will leave on losartan 25 mg daily and not transition yet to [facility] (can do as outpatient).    She remains in NSR on amiodarone.  Tremor has significantly worsened in the hospital.  This may be due to stress of surgery, but has also had a lot of amiodarone.  I would decrease her amiodarone to 200 mg daily for home and would try to stop it in the the near future.   I think she can go home today, will need close followup in CHF clinic.  Meds for home: amiodarone 200 daily, Eliquis 5 bid, ASA 81, losartan 25 daily, spironolactone 25 daily, dapagliflozin 10 daily, Zetia 10 daily, Crestor 40 daily, Lasix 40 daily, KCl 20 daily.   Marca Ancona 09/20/2023 10:13 AM

## 2023-09-20 NOTE — Progress Notes (Signed)
Pt recently ambulated. Discussed HF management and gave book. Also gave d/c videos to view. Pt and daughter receptive. Encouraged ambulation this afternoon if no d/c. [phone] Ethelda Chick BS, ACSM-CEP 09/20/2023 11:37 AM

## 2023-09-20 NOTE — Plan of Care (Signed)
  Problem: Education: Goal: Knowledge of General Education information will improve Description: Including pain rating scale, medication(s)/side effects and non-pharmacologic comfort measures Outcome: Progressing   Problem: Health Behavior/Discharge Planning: Goal: Ability to manage health-related needs will improve Outcome: Progressing   Problem: Clinical Measurements: Goal: Ability to maintain clinical measurements within normal limits will improve Outcome: Progressing Goal: Will remain free from infection Outcome: Progressing Goal: Diagnostic test results will improve Outcome: Progressing Goal: Respiratory complications will improve Outcome: Progressing Goal: Cardiovascular complication will be avoided Outcome: Progressing   Problem: Activity: Goal: Risk for activity intolerance will decrease Outcome: Progressing   Problem: Nutrition: Goal: Adequate nutrition will be maintained Outcome: Progressing   Problem: Coping: Goal: Level of anxiety will decrease Outcome: Progressing   Problem: Elimination: Goal: Will not experience complications related to bowel motility Outcome: Progressing Goal: Will not experience complications related to urinary retention Outcome: Progressing   Problem: Pain Management: Goal: General experience of comfort will improve Outcome: Progressing   Problem: Safety: Goal: Ability to remain free from injury will improve Outcome: Progressing   Problem: Skin Integrity: Goal: Risk for impaired skin integrity will decrease Outcome: Progressing   Problem: Education: Goal: Ability to describe self-care measures that may prevent or decrease complications (Diabetes Survival Skills Education) will improve Outcome: Progressing Goal: Individualized Educational Video(s) Outcome: Progressing   Problem: Coping: Goal: Ability to adjust to condition or change in health will improve Outcome: Progressing   Problem: Fluid Volume: Goal: Ability to  maintain a balanced intake and output will improve Outcome: Progressing   Problem: Health Behavior/Discharge Planning: Goal: Ability to identify and utilize available resources and services will improve Outcome: Progressing Goal: Ability to manage health-related needs will improve Outcome: Progressing   Problem: Metabolic: Goal: Ability to maintain appropriate glucose levels will improve Outcome: Progressing   Problem: Nutritional: Goal: Maintenance of adequate nutrition will improve Outcome: Progressing Goal: Progress toward achieving an optimal weight will improve Outcome: Progressing   Problem: Skin Integrity: Goal: Risk for impaired skin integrity will decrease Outcome: Progressing   Problem: Tissue Perfusion: Goal: Adequacy of tissue perfusion will improve Outcome: Progressing   Problem: Education: Goal: Will demonstrate proper wound care and an understanding of methods to prevent future damage Outcome: Progressing Goal: Knowledge of disease or condition will improve Outcome: Progressing Goal: Knowledge of the prescribed therapeutic regimen will improve Outcome: Progressing   Problem: Activity: Goal: Risk for activity intolerance will decrease Outcome: Progressing   Problem: Cardiac: Goal: Will achieve and/or maintain hemodynamic stability Outcome: Progressing   Problem: Clinical Measurements: Goal: Postoperative complications will be avoided or minimized Outcome: Progressing   Problem: Respiratory: Goal: Respiratory status will improve Outcome: Progressing   Problem: Skin Integrity: Goal: Wound healing without signs and symptoms of infection Outcome: Progressing Goal: Risk for impaired skin integrity will decrease Outcome: Progressing

## 2023-09-20 NOTE — Plan of Care (Signed)
  Problem: Education: Goal: Knowledge of General Education information will improve Description: Including pain rating scale, medication(s)/side effects and non-pharmacologic comfort measures Outcome: Progressing   Problem: Clinical Measurements: Goal: Ability to maintain clinical measurements within normal limits will improve Outcome: Progressing Goal: Respiratory complications will improve Outcome: Progressing Goal: Cardiovascular complication will be avoided Outcome: Progressing   Problem: Nutrition: Goal: Adequate nutrition will be maintained Outcome: Progressing   Problem: Coping: Goal: Level of anxiety will decrease Outcome: Progressing   Problem: Elimination: Goal: Will not experience complications related to urinary retention Outcome: Progressing   Problem: Pain Management: Goal: General experience of comfort will improve Outcome: Progressing

## 2023-09-21 ENCOUNTER — Other Ambulatory Visit (HOSPITAL_COMMUNITY): Payer: Self-pay

## 2023-09-21 DIAGNOSIS — I5023 Acute on chronic systolic (congestive) heart failure: Secondary | ICD-10-CM | POA: Diagnosis not present

## 2023-09-21 LAB — COOXEMETRY PANEL
Carboxyhemoglobin: 2.6 % — ABNORMAL HIGH (ref 0.5–1.5)
Methemoglobin: 0.7 % (ref 0.0–1.5)
O2 Saturation: 56.7 %
Total hemoglobin: 9.6 g/dL — ABNORMAL LOW (ref 12.0–16.0)

## 2023-09-21 LAB — BASIC METABOLIC PANEL
Anion gap: 10 (ref 5–15)
BUN: 35 mg/dL — ABNORMAL HIGH (ref 8–23)
CO2: 26 mmol/L (ref 22–32)
Calcium: 8.4 mg/dL — ABNORMAL LOW (ref 8.9–10.3)
Chloride: 97 mmol/L — ABNORMAL LOW (ref 98–111)
Creatinine, Ser: 1.27 mg/dL — ABNORMAL HIGH (ref 0.44–1.00)
GFR, Estimated: 44 mL/min — ABNORMAL LOW (ref >60)
Glucose, Bld: 153 mg/dL — ABNORMAL HIGH (ref 70–99)
Potassium: 4.7 mmol/L (ref 3.5–5.1)
Sodium: 133 mmol/L — ABNORMAL LOW (ref 135–145)

## 2023-09-21 LAB — GLUCOSE, CAPILLARY
Glucose-Capillary: 136 mg/dL — ABNORMAL HIGH (ref 70–99)
Glucose-Capillary: 121 mg/dL — ABNORMAL HIGH (ref 70–99)

## 2023-09-21 LAB — MAGNESIUM: Magnesium: 2.6 mg/dL — ABNORMAL HIGH (ref 1.7–2.4)

## 2023-09-21 MED ORDER — LOSARTAN POTASSIUM 25 MG PO TABS
25.0000 mg | ORAL_TABLET | Freq: Every day | ORAL | 1 refills | Status: DC
  Filled 2023-09-21: qty 30, 30d supply, fill #0

## 2023-09-21 MED ORDER — ASPIRIN 81 MG PO TBEC: 81.0000 mg | DELAYED_RELEASE_TABLET | Freq: Every day | ORAL | Status: AC

## 2023-09-21 MED ORDER — AMIODARONE HCL 200 MG PO TABS
200.0000 mg | ORAL_TABLET | Freq: Every day | ORAL | 1 refills | Status: DC
  Filled 2023-09-21: qty 30, 30d supply, fill #0

## 2023-09-21 MED ORDER — METFORMIN HCL 1000 MG PO TABS: 1000.0000 mg | ORAL_TABLET | Freq: Two times a day (BID) | ORAL | Status: DC

## 2023-09-21 MED ORDER — INSULIN GLARGINE-YFGN 100 UNIT/ML ~~LOC~~ SOLN
48.0000 [IU] | Freq: Two times a day (BID) | SUBCUTANEOUS | Status: DC
  Administered 2023-09-21: 48 [IU] via SUBCUTANEOUS
  Filled 2023-09-21 (×2): qty 0.48

## 2023-09-21 MED ORDER — ACETAMINOPHEN 325 MG PO TABS: 650.0000 mg | ORAL_TABLET | Freq: Four times a day (QID) | ORAL | Status: AC | PRN

## 2023-09-21 MED ORDER — ROSUVASTATIN CALCIUM 40 MG PO TABS
40.0000 mg | ORAL_TABLET | Freq: Every day | ORAL | 1 refills | Status: DC
  Filled 2023-09-21: qty 30, 30d supply, fill #0

## 2023-09-21 MED ORDER — DAPAGLIFLOZIN PROPANEDIOL 10 MG PO TABS
10.0000 mg | ORAL_TABLET | Freq: Every day | ORAL | 1 refills | Status: DC
  Filled 2023-09-21: qty 30, 30d supply, fill #0

## 2023-09-21 MED ORDER — APIXABAN 5 MG PO TABS
5.0000 mg | ORAL_TABLET | Freq: Two times a day (BID) | ORAL | 1 refills | Status: DC
  Filled 2023-09-21: qty 60, 30d supply, fill #0

## 2023-09-21 MED ORDER — SPIRONOLACTONE 25 MG PO TABS
25.0000 mg | ORAL_TABLET | Freq: Every day | ORAL | 1 refills | Status: DC
  Filled 2023-09-21: qty 30, 30d supply, fill #0

## 2023-09-21 MED ORDER — TRAMADOL HCL 50 MG PO TABS
50.0000 mg | ORAL_TABLET | Freq: Four times a day (QID) | ORAL | 0 refills | Status: DC | PRN
  Filled 2023-09-21: qty 28, 7d supply, fill #0

## 2023-09-21 MED ORDER — FUROSEMIDE 40 MG PO TABS
40.0000 mg | ORAL_TABLET | Freq: Every day | ORAL | 0 refills | Status: DC
  Filled 2023-09-21: qty 30, 30d supply, fill #0

## 2023-09-21 NOTE — Progress Notes (Signed)
Patient and daughter have been provided discharge instructions. TOC medications picked up. Daughter verbalizes understanding of all instructions.

## 2023-09-21 NOTE — Progress Notes (Addendum)
      [address]       [place] [address]             [phone]        15 Days Post-Op Procedure(s) (LRB): CORONARY ARTERY BYPASS GRAFTING (CABG) TIMES FOUR USING LEFT INTERNAL MAMMARY ARTERY AND RIGHT GREATER SAPHENOUS VEIN, HARVESTED ENDOSCOPICALLY (N/A) MITRAL VALVE REPAIR USING CARPENTIER-MCCARTHY-ADAMS ANNUOPLASTY RING (N/A) TRANSESOPHAGEAL ECHOCARDIOGRAM (N/A)  Subjective: Patient sitting in chair eating breakfast. She has no specific complaint this am and really wants to go home.  Objective: Vital signs in last 24 hours: Temp:  [97.7 F (36.5 C)-99.5 F (37.5 C)] 98.4 F (36.9 C) (12/05 0324) Pulse Rate:  [78-92] 87 (12/05 0324) Cardiac Rhythm: Normal sinus rhythm (12/04 2000) Resp:  [18-20] 20 (12/05 0324) BP: (99-119)/(47-70) 119/55 (12/05 0324) SpO2:  [93 %-97 %] 97 % (12/05 0324) Weight:  [95.8 kg] 95.8 kg (12/05 0324)  Pre op weight 92.4 kg Current Weight  09/21/23 95.8 kg      Intake/Output from previous day: 12/04 0701 - 12/05 0700 In: 10 [I.V.:10] Out: 1100 [Urine:1100]   Physical Exam:  Cardiovascular: RRR, no murmur Pulmonary: Slightly diminished bibasilar Abdomen: Soft, non tender, bowel sounds present. Extremities: Trace bilateral lower extremity edema. Wounds: Clean and dry.  No erythema or signs of infection.  Lab Results: CBC:No results for input(s): "WBC", "HGB", "HCT", "PLT" in the last 72 hours. BMET:  Recent Labs    09/20/23 0402 09/21/23 0352  NA 134* 133*  K 4.4 4.7  CL 99 97*  CO2 28 26  GLUCOSE 69* 153*  BUN 23 35*  CREATININE 1.18* 1.27*  CALCIUM 8.2* 8.4*    PT/INR:  Lab Results  Component Value Date   INR 1.5 (H) 09/06/2023   INR 1.0 09/06/2023   INR 0.89 05/17/2010   ABG:  INR: Will add last result for INR, ABG once components are confirmed Will add last 4 CBG results once components are confirmed  Assessment/Plan: 1. CV - Previous a fib. Maintaining SR, first degree heart  block. Co ox 56.7 (has been off Milrinone awhile). On Amiodarone 200 mg daily, Losartan 25 mg daily, and Apixaban 2.  Pulmonary - On room air.  Encourage incentive spirometer 3. Acute systolic heart failure with low output-on Lasix 40 mg daily, Farxiga 10 mg daily, and Spironolactone 25 mg daily. AHF following. 4.  Expected post op acute blood loss anemia - Last H and H 8.8 and 28.7 5. DM-CBGs 240/120/136. On Insulin but will increase. Pre op HGA1C 7.8. Will restart Metformin at discharge if creatinine ok 6. AKI-Creatinine slightly increased to 1.27(creatinine 0.86 on admission). 7. Deconditioned-continue PT/OT, HH ordered 8. Potassium up to 4.7. Will stop supplementation as on Spironolactone and potassium has been steadily increasing with supplement 9. Remove PICC 10. Disposition-per AHF, medications optimized and ok for discharge. Mobility improving;likely, discharge later today     Donielle M ZimmermanPA-C 6:59 AM Patient seen and examined, agree with above Dc home today  Salvatore Decent. Dorris Fetch, MD [facility] [phone]

## 2023-09-21 NOTE — Progress Notes (Signed)
RA DL PICC removed per protocol per MD order. Manual pressure applied for 3 mins. Vaseline gauze, gauze, and Tegaderm applied over insertion site. Some bruising noted from insertion of line. No bleeding or swelling noted. Instructed patient to remain in bed for thirty mins. Educated patient about S/S of infection and when to call MD; no heavy lifting or pressure on right side for 24 hours; keep dressing dry and intact for 24 hours. Pt and  pt's daughter verbalized comprehension.

## 2023-09-21 NOTE — Plan of Care (Signed)
  Problem: Education: Goal: Knowledge of General Education information will improve Description: Including pain rating scale, medication(s)/side effects and non-pharmacologic comfort measures Outcome: Progressing   Problem: Health Behavior/Discharge Planning: Goal: Ability to manage health-related needs will improve Outcome: Progressing   Problem: Clinical Measurements: Goal: Ability to maintain clinical measurements within normal limits will improve Outcome: Progressing Goal: Will remain free from infection Outcome: Progressing Goal: Diagnostic test results will improve Outcome: Progressing Goal: Respiratory complications will improve Outcome: Progressing Goal: Cardiovascular complication will be avoided Outcome: Progressing   Problem: Activity: Goal: Risk for activity intolerance will decrease Outcome: Progressing   Problem: Nutrition: Goal: Adequate nutrition will be maintained Outcome: Progressing   Problem: Coping: Goal: Level of anxiety will decrease Outcome: Progressing   Problem: Elimination: Goal: Will not experience complications related to bowel motility Outcome: Progressing Goal: Will not experience complications related to urinary retention Outcome: Progressing   Problem: Pain Management: Goal: General experience of comfort will improve Outcome: Progressing   Problem: Safety: Goal: Ability to remain free from injury will improve Outcome: Progressing   Problem: Skin Integrity: Goal: Risk for impaired skin integrity will decrease Outcome: Progressing   Problem: Education: Goal: Ability to describe self-care measures that may prevent or decrease complications (Diabetes Survival Skills Education) will improve Outcome: Progressing   Problem: Coping: Goal: Ability to adjust to condition or change in health will improve Outcome: Progressing

## 2023-09-21 NOTE — Progress Notes (Addendum)
Physical Therapy Treatment Patient Details Name: Diana [name] MRN: [id number] DOB: [birth year] Today's Date: 09/21/2023   History of Present Illness 77 yo female admitted 11/11 for HF optimization pre CABG + MVR, R/LHC same date. 11/20 CABG x 4, MVR. 11/27 thoracentesis. PMhx: CHF, CAD, T2DM, HTN, obesity, HLD, COPD, neuropathy    PT Comments  Pt very pleasant, eager to mobilize and D/C today. Pt able to state sternal precautions and educated for all. Pt with increased gait distance and improved stair trial. Pt continues to struggle with bed mobility but plans to use lift chair at home. Continues to attempt IS use only achieving 250cc.  HR 87-103 Pre gait 122/59 (78) Post gait 153/69 (91)   If plan is discharge home, recommend the following: A little help with bathing/dressing/bathroom;Assistance with cooking/housework;Assist for transportation;Help with stairs or ramp for entrance;A little help with walking and/or transfers   Can travel by private vehicle        Equipment Recommendations  None recommended by PT    Recommendations for Other Services       Precautions / Restrictions Precautions Precautions: Sternal;Fall     Mobility  Bed Mobility Overal bed mobility: Needs Assistance Bed Mobility: Sit to Supine       Sit to supine: Mod assist   General bed mobility comments: mod assist to lift legs to bed, plans to sleep in lift chair    Transfers Overall transfer level: Modified independent                 General transfer comment: pt able to stand from recliner, rollator x 2 and toilet without assist. Cues at times to lock brakes prior to sitting, good hand placement to maintain precautions.    Ambulation/Gait Ambulation/Gait assistance: Supervision Gait Distance (Feet): 120 Feet Assistive device: Rollator (4 wheels) Gait Pattern/deviations: Step-through pattern, Decreased stride length       General Gait Details: Pt walked 90', 30', 120' each with  seated rest between trials, only a few cues for posture and safety with rollator, pt with HR 87-103 with gait, tolerating well on RA   Stairs Stairs: Yes Stairs assistance: Min assist Stair Management: One rail Left, Step to pattern, Forwards Number of Stairs: 3 General stair comments: 3 steps with use of rail on left and HHA on Right, step to pattern with good balance and HHA for safety, seated rest required after stair trial   Wheelchair Mobility     Tilt Bed    Modified Rankin (Stroke Patients Only)       Balance Overall balance assessment: Needs assistance Sitting-balance support: No upper extremity supported, Feet supported Sitting balance-Leahy Scale: Good     Standing balance support: Bilateral upper extremity supported Standing balance-Leahy Scale: Poor Standing balance comment: Rollator in standing                            Cognition Arousal: Alert Behavior During Therapy: WFL for tasks assessed/performed Overall Cognitive Status: Within Functional Limits for tasks assessed                                          Exercises      General Comments        Pertinent Vitals/Pain Pain Assessment Pain Assessment: No/denies pain    Home Living  Prior Function            PT Goals (current goals can now be found in the care plan section) Progress towards PT goals: Progressing toward goals    Frequency    Min 1X/week      PT Plan      Co-evaluation              AM-PAC PT "6 Clicks" Mobility   Outcome Measure  Help needed turning from your back to your side while in a flat bed without using bedrails?: A Little Help needed moving from lying on your back to sitting on the side of a flat bed without using bedrails?: A Little Help needed moving to and from a bed to a chair (including a wheelchair)?: A Little Help needed standing up from a chair using your arms (e.g., wheelchair  or bedside chair)?: A Little Help needed to walk in hospital room?: A Little Help needed climbing 3-5 steps with a railing? : A Little 6 Click Score: 18    End of Session Equipment Utilized During Treatment: Gait belt Activity Tolerance: Patient tolerated treatment well Patient left: in bed;with call bell/phone within reach;with family/visitor present Nurse Communication: Mobility status PT Visit Diagnosis: Other abnormalities of gait and mobility (R26.89);Muscle weakness (generalized) (M62.81)     Time: 0832-0900 PT Time Calculation (min) (ACUTE ONLY): 28 min  Charges:    $Gait Training: 8-22 mins $Therapeutic Activity: 8-22 mins PT General Charges $$ ACUTE PT VISIT: 1 Visit                     Merryl Hacker, PT Acute Rehabilitation Services Office: [phone]    Cristine Polio 09/21/2023, 9:33 AM

## 2023-09-21 NOTE — Progress Notes (Addendum)
Mobility Specialist Progress Note:   09/21/23 1200  Mobility  Activity Ambulated with assistance in hallway  Level of Assistance Standby assist, set-up cues, supervision of patient - no hands on  Assistive Device Four wheel walker  Distance Ambulated (ft) 230 ft  RUE Weight Bearing NWB  LUE Weight Bearing NWB  Activity Response Tolerated well  Mobility Referral Yes  Mobility visit 1 Mobility  Mobility Specialist Start Time (ACUTE ONLY) 1140  Mobility Specialist Stop Time (ACUTE ONLY) 1155  Mobility Specialist Time Calculation (min) (ACUTE ONLY) 15 min   Pre Mobility: 83 HR , 89% SpO2 RA During Mobility: 89 HR , 87% SpO2 RA Post Mobility: 73 HR   Pt received in bed, agreeable to mobility. Seated rest break required d/t fatigue and SOB. Pt desat to 87% on RA. Pursed lip breathing encouraged. After recovery pt able to ambulate to room w/o fault. Pt left in chair with call bell in reach and all needs met. Family present. RN notified.  Leory Plowman  Mobility Specialist Please contact via [employer] office at [phone]

## 2023-09-21 NOTE — TOC Progression Note (Addendum)
Transition of Care [facility]) - Progression Note    Patient Details  Name: Diana [name] MRN: [id number] Date of Birth: [birth year]  Transition of Care [facility]) CM/SW Contact  Elliot Cousin, RN Phone Number: [phone] 09/21/2023, 9:26 AM  Clinical Narrative:    Family will provide transportation home.  Contacted [facility] rep, Tresa Endo to make aware of dc home today with HH.  Contacted [employer] for oxygen for home.  Faxed medical request for patient to receive her hospital records.     Expected Discharge Plan: Home w Home Health Services Barriers to Discharge: No Barriers Identified  Expected Discharge Plan and Services   Discharge Planning Services: CM Consult Post Acute Care Choice: Home Health Living arrangements for the past 2 months: Single Family Home Expected Discharge Date: 09/21/23                 DME Agency: [employer] Date DME Agency Contacted: 09/01/23 Time DME Agency Contacted: 1430 Representative spoke with at DME Agency: Marthann Schiller HH Arranged: RN, PT HH Agency: [facility] Date [facility] Agency Contacted: 09/21/23 Time HH Agency Contacted: [phone] Representative spoke with at [facility] Agency: Hassel Neth   Social Determinants of Health (SDOH) Interventions SDOH Screenings   Food Insecurity: No Food Insecurity (08/28/2023)  Housing: Low Risk  (08/28/2023)  Transportation Needs: No Transportation Needs (08/28/2023)  Utilities: Not At Risk (08/28/2023)  Tobacco Use: Low Risk  (09/06/2023)    Readmission Risk Interventions     No data to display

## 2023-09-21 NOTE — Progress Notes (Addendum)
Advanced Heart Failure Rounding Note  PCP-Cardiologist: Nicki Guadalajara, MD   Subjective:   11/11: R/LHC severe MVCAD, RA 21, PAP 83/37/56, PCWP 36, CO 4.11, CI 2.04>>started on milrinone + IV Lasix.  11/13: AFL w/ RVR. Started on IV amio + heparin gtt. Milrinone discontinued    11/14: TEE: EF 25%, RV severely reduced, severe LAE, 3-4+ MR 11/19: RHC- RA 2, PA 45/17 (29), PCWP 11 vwaves to 18, CO/CI (thermo) 4.62/2.4, PVR 2w, PAPi 14 11/20: s/p CABG x4  (LIMA to LAD, SVG to diagonal, SVG to OM, SVG to acute marginal) + MVR by Dr. Dorris Fetch  POD #15  Seen while walking halls with PT.  Excited to go home.    Objective:    Weight Range: 95.8 kg Body mass index is 36.25 kg/m.   Vital Signs:   Temp:  [97.6 F (36.4 C)-99.5 F (37.5 C)] 97.6 F (36.4 C) (12/05 0726) Pulse Rate:  [78-89] 85 (12/05 0726) Resp:  [18-20] 18 (12/05 0726) BP: (96-119)/(47-61) 96/57 (12/05 0726) SpO2:  [94 %-97 %] 96 % (12/05 0726) Weight:  [95.8 kg] 95.8 kg (12/05 0324) Last BM Date : 09/20/23  Weight change: Filed Weights   09/19/23 0536 09/20/23 0640 09/21/23 0324  Weight: 93 kg 93.8 kg 95.8 kg    Intake/Output:   Intake/Output Summary (Last 24 hours) at 09/21/2023 0854 Last data filed at 09/21/2023 0325 Gross per 24 hour  Intake 10 ml  Output 1100 ml  Net -1090 ml     Physical Exam  General: Walking in the hall HEENT: normal Neck: supple. no JVD.  Cor: PMI nondisplaced. Regular rate & rhythm. No rubs, gallops or murmurs. Lungs: clear Abdomen: soft, nontender, nondistended.  Extremities: no cyanosis, clubbing, rash, edema Neuro: alert & orientedx3. Affect pleasant    Telemetry   SR 80s  Labs    CBC No results for input(s): "WBC", "NEUTROABS", "HGB", "HCT", "MCV", "PLT" in the last 72 hours.  Basic Metabolic Panel Recent Labs    16/10/96 0402 09/21/23 0352  NA 134* 133*  K 4.4 4.7  CL 99 97*  CO2 28 26  GLUCOSE 69* 153*  BUN 23 35*  CREATININE 1.18* 1.27*   CALCIUM 8.2* 8.4*  MG 2.4 2.6*    Imaging   No results found.  Medications:    Scheduled Medications:  sodium chloride   Intravenous Once   amiodarone  200 mg Oral Daily   apixaban  5 mg Oral BID   aspirin EC  81 mg Oral Daily   bisacodyl  10 mg Oral Daily   Or   bisacodyl  10 mg Rectal Daily   Chlorhexidine Gluconate Cloth  6 each Topical Daily   dapagliflozin propanediol  10 mg Oral Daily   docusate sodium  200 mg Oral Daily   ezetimibe  10 mg Oral Daily   feeding supplement (GLUCERNA SHAKE)  237 mL Oral TID BM   furosemide  40 mg Oral Daily   gabapentin  100 mg Oral QHS   Gerhardt's butt cream   Topical QID   insulin aspart  0-20 Units Subcutaneous TID WC   insulin aspart  0-5 Units Subcutaneous QHS   insulin aspart  10 Units Subcutaneous TID WC   insulin glargine-yfgn  48 Units Subcutaneous BID   loratadine  10 mg Oral Daily   losartan  25 mg Oral Daily   pantoprazole  40 mg Oral Daily   polyethylene glycol  17 g Oral Daily   rosuvastatin  40 mg Oral Daily   sodium chloride flush  10-40 mL Intracatheter Q12H   sodium chloride flush  3 mL Intravenous Q12H   spironolactone  25 mg Oral Daily    Infusions:  albumin human Stopped (09/06/23 2310)    PRN Medications: acetaminophen, albumin human, ondansetron (ZOFRAN) IV, mouth rinse, oxyCODONE, sodium chloride flush, sodium chloride flush, traMADol  Patient Profile   77 y/o female w/ h/o CAD s/p remote LAD and RCA PCI in 2011, Type 2DM and HLD, recently diagnosed w/ new systolic heart failure and severe MR by echo. 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 AHF team consulted for HF optimization prior to CABG + MVR.  Assessment/Plan  1. Acute Systolic Heart Failure w/ Low Output  - Echo 2014 w/ normal EF 50-55%, normal RV - Echo 11/24 EF 25-30%, severe MR, RV low normal  - Ischemic CM. LHC w/ severe MVCAD.  - RHC c/w  decompensated heart failure, severe LV dysfunction, and severe mitral regurgitation (RA 21, PAP 83/37/56, PCWP 36, CO 4.11, CI 2.04) - 11/19 RHC: RA 2, PA 45/17 (29), PCWP 11 vwaves to 18, CO/CI (thermo) 4.62/2.4, PVR 2w, PAPi 14 - TEE: EF 25%, RV severely reduced, severe LAE, 3-4+ MR - S/p CABG X 4 (LIMA to LAD, SVG to diagonal, SVG to OM, SVG to acute marginal) + MVR on 11/20 Echo 12/02: EF 30-35%, s/p MV repair with trivial MR and mean gradient 5 mmHg.  - CO-OX stable off milrinone - Continue lasix 40 mg daily - Continue farxiga 10 mg daily, arranging grant through HF clinic - Continue spironolactone 25mg  today  - Continue Losartan 25 mg daily  2. Severe MR  - appears ischemic w/ posterior leaflet tethering on echo. TEE 3-4+ MR - Now s/p MVR 11/20 at time of CABG on 11/20   3. Severe MVCAD  - Now s/p CABG X 4 (LIMA to LAD, SVG to diagonal, SVG to OM, SVG to acute marginal) on 11/20 - Aspirin + statin - No s/s angina  4. PAF/atrial tach  - SR on tele - Decreased po amiodarone to 200 mg daily - Continue Eliquis 5 BID   5. Type 2DM  - Hgb A1c 7.8  - Insulin per TCTS  6. AKI:  -Resolved.  -Scr stable 1.27  7. Acute blood loss anemia - Hgb stable at 8.8 a couple of days ago  8. L Pleural effusion - improved lung sounds   9. Tremor -She reports she had tremor chronically, but worse post-op. Suspect related to surgery but has also received a large amount of amiodarone this admission.  -Decrease amiodarone to 200 mg daily as above. Hopefully can stop in near future.  Has follow-up scheduled in HF clinic.  Okay for discharge from HF standpoint. Has order for PICC removal.  Meds for home:  amiodarone 200 daily Eliquis 5 bid ASA 81 losartan 25 daily spironolactone 25 daily dapagliflozin 10 daily Zetia 10 daily Crestor 40 daily Lasix 40 daily   Length of Stay: 24  FINCH, LINDSAY N, PA-C  09/21/2023, 8:54 AM  Advanced Heart Failure Team Pager [phone] (M-F; 7a -  5p)  Please contact [facility] for night-coverage after hours (5p -7a ) and weekends on [email or web address]  Patient seen with PA, agree with the above note.   Still with tremor.  Ready to go home, denies dyspnea.   Co-ox 58%.   General: NAD Neck: No JVD, no thyromegaly or thyroid  nodule.  Lungs: Clear to auscultation bilaterally with normal respiratory effort. CV: Nondisplaced PMI.  Heart regular S1/S2, no S3/S4, no murmur.  Trace ankle edema.   Abdomen: Soft, nontender, no hepatosplenomegaly, no distention.  Skin: Intact without lesions or rashes.  Neurologic: Alert and oriented x 3.  Psych: Normal affect. Extremities: No clubbing or cyanosis.  HEENT: Normal.   Volume status looks good, co-ox adequate at 58%.  I think she is ready for home today.  We will send her home on the above regimen.  She will have followup in CHF clinic.    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  I suspect this is related to the stress of surgery but she is also on amiodarone for AF suppression that can worsen a tremor.  We have decreased amiodarone to 200 mg daily and will plan to stop it after 1 month as long as no further AF.   Marca Ancona 09/21/2023 12:11 PM

## 2023-09-21 NOTE — Progress Notes (Signed)
D/c chest tube suture per order.   Lawson Radar, RN

## 2023-09-21 NOTE — Progress Notes (Signed)
Patient in chair & needing PT prior to D/c, per PT will have patient in bed once session is complete. Will plan to return to bedside for PICC removal after 0900.

## 2023-09-21 NOTE — TOC CM/SW Note (Signed)
SATURATION QUALIFICATIONS: (This note is used to comply with regulatory documentation for home oxygen)  Patient Saturations on Room Air at Rest = 87%  Patient Saturations on 2 Liters of oxygen while Ambulating = 96%  Please briefly explain why patient needs home oxygen: Heart Failure

## 2023-09-25 ENCOUNTER — Telehealth (HOSPITAL_COMMUNITY): Payer: Self-pay

## 2023-09-25 NOTE — Telephone Encounter (Signed)
Called and spoke with pt daughter Herbert Seta who stated pt is interested in CR. Explained scheduling process, she verbalized understanding. Will contact patient for scheduling once f/u has been completed.

## 2023-09-27 LAB — LAB REPORT - SCANNED: EGFR: 43

## 2023-10-02 ENCOUNTER — Other Ambulatory Visit: Payer: Self-pay | Admitting: Thoracic Surgery (Cardiothoracic Vascular Surgery)

## 2023-10-02 ENCOUNTER — Other Ambulatory Visit (HOSPITAL_COMMUNITY): Payer: Self-pay

## 2023-10-02 ENCOUNTER — Encounter (HOSPITAL_COMMUNITY): Payer: Self-pay

## 2023-10-02 ENCOUNTER — Ambulatory Visit (HOSPITAL_COMMUNITY)
Admit: 2023-10-02 | Discharge: 2023-10-02 | Disposition: A | Discharge status: Home / Self Care | Payer: Medicare HMO | Attending: Family Medicine

## 2023-10-02 VITALS — BP 114/60 | HR 85 | Ht 64.0 in | Wt 214.4 lb

## 2023-10-02 DIAGNOSIS — I340 Nonrheumatic mitral (valve) insufficiency: Secondary | ICD-10-CM | POA: Diagnosis not present

## 2023-10-02 DIAGNOSIS — I5023 Acute on chronic systolic (congestive) heart failure: Secondary | ICD-10-CM

## 2023-10-02 DIAGNOSIS — Z794 Long term (current) use of insulin: Secondary | ICD-10-CM | POA: Diagnosis not present

## 2023-10-02 DIAGNOSIS — I110 Hypertensive heart disease with heart failure: Secondary | ICD-10-CM | POA: Diagnosis not present

## 2023-10-02 DIAGNOSIS — Z9889 Other specified postprocedural states: Secondary | ICD-10-CM

## 2023-10-02 DIAGNOSIS — I480 Paroxysmal atrial fibrillation: Secondary | ICD-10-CM | POA: Insufficient documentation

## 2023-10-02 DIAGNOSIS — Z79899 Other long term (current) drug therapy: Secondary | ICD-10-CM | POA: Insufficient documentation

## 2023-10-02 DIAGNOSIS — Z7982 Long term (current) use of aspirin: Secondary | ICD-10-CM | POA: Insufficient documentation

## 2023-10-02 DIAGNOSIS — E119 Type 2 diabetes mellitus without complications: Secondary | ICD-10-CM | POA: Insufficient documentation

## 2023-10-02 DIAGNOSIS — I5043 Acute on chronic combined systolic (congestive) and diastolic (congestive) heart failure: Secondary | ICD-10-CM | POA: Diagnosis not present

## 2023-10-02 DIAGNOSIS — Z7984 Long term (current) use of oral hypoglycemic drugs: Secondary | ICD-10-CM | POA: Insufficient documentation

## 2023-10-02 DIAGNOSIS — I5022 Chronic systolic (congestive) heart failure: Secondary | ICD-10-CM | POA: Diagnosis present

## 2023-10-02 DIAGNOSIS — I255 Ischemic cardiomyopathy: Secondary | ICD-10-CM | POA: Insufficient documentation

## 2023-10-02 DIAGNOSIS — E785 Hyperlipidemia, unspecified: Secondary | ICD-10-CM | POA: Insufficient documentation

## 2023-10-02 DIAGNOSIS — Z7985 Long-term (current) use of injectable non-insulin antidiabetic drugs: Secondary | ICD-10-CM | POA: Insufficient documentation

## 2023-10-02 DIAGNOSIS — R0602 Shortness of breath: Secondary | ICD-10-CM | POA: Diagnosis not present

## 2023-10-02 DIAGNOSIS — Z951 Presence of aortocoronary bypass graft: Secondary | ICD-10-CM | POA: Insufficient documentation

## 2023-10-02 DIAGNOSIS — Z7901 Long term (current) use of anticoagulants: Secondary | ICD-10-CM | POA: Diagnosis not present

## 2023-10-02 LAB — BASIC METABOLIC PANEL
Anion gap: 11 (ref 5–15)
BUN: 42 mg/dL — ABNORMAL HIGH (ref 8–23)
CO2: 27 mmol/L (ref 22–32)
Calcium: 8.3 mg/dL — ABNORMAL LOW (ref 8.9–10.3)
Chloride: 96 mmol/L — ABNORMAL LOW (ref 98–111)
Creatinine, Ser: 1.34 mg/dL — ABNORMAL HIGH (ref 0.44–1.00)
GFR, Estimated: 41 mL/min — ABNORMAL LOW (ref >60)
Glucose, Bld: 130 mg/dL — ABNORMAL HIGH (ref 70–99)
Potassium: 4.5 mmol/L (ref 3.5–5.1)
Sodium: 134 mmol/L — ABNORMAL LOW (ref 135–145)

## 2023-10-02 LAB — BRAIN NATRIURETIC PEPTIDE: B Natriuretic Peptide: 734.3 pg/mL — ABNORMAL HIGH (ref 0.0–100.0)

## 2023-10-02 MED ORDER — FUROSEMIDE 40 MG PO TABS: 60.0000 mg | ORAL_TABLET | Freq: Every day | ORAL | 3 refills | Status: DC

## 2023-10-02 MED ORDER — ENTRESTO 24-26 MG PO TABS: 1.0000 | ORAL_TABLET | Freq: Two times a day (BID) | ORAL | 6 refills | Status: DC

## 2023-10-02 NOTE — Progress Notes (Signed)
Advanced Heart Failure Clinic Note   Referring Physician: PCP: [name], NP PCP-Cardiologist: Nicki Guadalajara, MD  [facility]: Dr. Gala Romney   HPI:  77 y/o female w/ h/o CAD s/p remote LAD and RCA PCI in 2011, Type 2DM and HLD, recently diagnosed w/ new systolic heart failure and severe MR by echo 11/24. 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 Severe MR also confirmed by TEE. AHF team  was consulted for HF optimization prior to CABG + MVR. She was optimized w/ milrinone and diuresed w/ IV Lasix. Underwent CABG x4 (LIMA to LAD, SVG to diagonal, SVG to OM, SVG to acute marginal) + MVR by Dr. Dorris Fetch. Weaned off pressors and transitioned to oral GDMT. Also developed PAF post op and placed on amio. Discharged home on 12/5.   She presents today for post hospital f/u. Here w/ daughter. Volume overloaded w/ 1-2+ b/l LEE. Has TEDs at home but not wearing currently. She denies resting dyspnea but SOB moving around her house. Reports full med compliance. States that UOP is not robust w/ 40 mg of Lasix. BP 114/60 today. No orthostatic symptoms. EKG shows NSR today but she has had symptoms of what sounds like breakthrough Afib, w/ tachy palpitations and HR as high as the 130s since d/c. Reports full compliance w/ amiodarone and Eliquis.   She also reports light drainage from sternal wound. Drainage has been clear. Denies purulent drainage. Her daughter has been keeping area dry w/ bandages. She has had no associated chest wall pain or erythema. No associated fever and chills. They have made TCTS aware and has f/u in their office tomorrow.    Review of Systems: [y] = yes, [ ]  = no   General: Weight gain [ ] ; Weight loss [ ] ; Anorexia [ ] ; Fatigue [ ] ; Fever [ ] ; Chills [ ] ; Weakness [ ]   Cardiac: Chest pain/pressure [ ] ; Resting SOB [ ] ; Exertional SOB [ ] ; Orthopnea [ ] ; Pedal Edema [ ] ; Palpitations [ ] ;  Syncope [ ] ; Presyncope [ ] ; Paroxysmal nocturnal dyspnea[ ]   Pulmonary: Cough [ ] ; Wheezing[ ] ; Hemoptysis[ ] ; Sputum [ ] ; Snoring [ ]   GI: Vomiting[ ] ; Dysphagia[ ] ; Melena[ ] ; Hematochezia [ ] ; Heartburn[ ] ; Abdominal pain [ ] ; Constipation [ ] ; Diarrhea [ ] ; BRBPR [ ]   GU: Hematuria[ ] ; Dysuria [ ] ; Nocturia[ ]   Vascular: Pain in legs with walking [ ] ; Pain in feet with lying flat [ ] ; Non-healing sores [ ] ; Stroke [ ] ; TIA [ ] ; Slurred speech [ ] ;  Neuro: Headaches[ ] ; Vertigo[ ] ; Seizures[ ] ; Paresthesias[ ] ;Blurred vision [ ] ; Diplopia [ ] ; Vision changes [ ]   Ortho/Skin: Arthritis [ ] ; Joint pain [ ] ; Muscle pain [ ] ; Joint swelling [ ] ; Back Pain [ ] ; Rash [ ]   Psych: Depression[ ] ; Anxiety[ ]   Heme: Bleeding problems [ ] ; Clotting disorders [ ] ; Anemia [ ]   Endocrine: Diabetes [ ] ; Thyroid dysfunction[ ]    Past Medical History:  Diagnosis Date   CAD (coronary artery disease) 7/12-8/12   staged LAD/RCA DES   Diabetes mellitus (HCC)    Diastolic dysfunction 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    Dyslipidemia    HTN (hypertension)     Current Outpatient Medications  Medication Sig Dispense Refill   acetaminophen (TYLENOL) 325 MG tablet Take 2 tablets (650 mg total) by mouth every 6 (six) hours as needed for mild pain (pain score 1-3).  amiodarone (PACERONE) 200 MG tablet Take 1 tablet (200 mg total) by mouth daily. 30 tablet 1   apixaban (ELIQUIS) 5 MG TABS tablet Take 1 tablet (5 mg total) by mouth 2 (two) times daily. 60 tablet 1   aspirin EC 81 MG tablet Take 1 tablet (81 mg total) by mouth daily. Swallow whole.     BD INSULIN SYRINGE U/F 1/2UNIT 31G X 5/16" 0.3 ML MISC      Beta Carotene (VITAMIN A) 25000 UNIT capsule Take 25,000 Units by mouth daily.     Biotin 5000 MCG CAPS Take 1 capsule by mouth daily.     cyanocobalamin (VITAMIN B12) 1000 MCG tablet Take 2,000 mcg by mouth daily.     dapagliflozin propanediol (FARXIGA) 10 MG TABS tablet Take 1 tablet (10 mg total) by mouth daily. 30  tablet 1   ezetimibe (ZETIA) 10 MG tablet TAKE 1 TABLET BY MOUTH EVERY DAY 90 tablet 2   ferrous sulfate 324 MG TBEC Take 324 mg by mouth daily.     gabapentin (NEURONTIN) 100 MG capsule Take 100 mg by mouth See admin instructions. Take 100 mg at bedtime, may take an additional 100 mg twice daily as needed for pain     insulin NPH-regular Human (NOVOLIN 70/30) (70-30) 100 UNIT/ML injection Inject 60 Units into the skin 2 (two) times daily.     loratadine (CLARITIN) 10 MG tablet Take 10 mg by mouth daily.     Oyster Shell (OYSTER CALCIUM) 500 MG TABS tablet Take 500 mg of elemental calcium by mouth 2 (two) times a week.     pantoprazole (PROTONIX) 40 MG tablet Take 1 tablet by mouth daily. Reported on 03/24/2016     sacubitril-valsartan (ENTRESTO) 24-26 MG Take 1 tablet by mouth 2 (two) times daily. 60 tablet 6   spironolactone (ALDACTONE) 25 MG tablet Take 1 tablet (25 mg total) by mouth daily. 30 tablet 1   traMADol (ULTRAM) 50 MG tablet Take 1 tablet (50 mg total) by mouth every 6 (six) hours as needed for severe pain (pain score 7-10). 28 tablet 0   fish oil-omega-3 fatty acids 1000 MG capsule Take 1 g by mouth daily. (Patient not taking: Reported on 10/02/2023)     furosemide (LASIX) 40 MG tablet Take 1.5 tablets (60 mg total) by mouth daily. 45 tablet 3   metFORMIN (GLUCOPHAGE) 1000 MG tablet Take 1 tablet (1,000 mg total) by mouth 2 (two) times daily with a meal. (Patient not taking: Reported on 10/02/2023)     rosuvastatin (CRESTOR) 40 MG tablet Take 1 tablet (40 mg total) by mouth daily. (Patient not taking: Reported on 10/02/2023) 30 tablet 1   Semaglutide,0.25 or 0.5MG /DOS, (OZEMPIC, 0.25 OR 0.5 MG/DOSE,) 2 MG/3ML SOPN Inject 0.5 mg into the skin once a week. (Patient not taking: Reported on 10/02/2023) 3 mL 2   No current facility-administered medications for this encounter.    Allergies  Allergen Reactions   Lisinopril Cough   Sulfa Antibiotics Hives      Social History    Socioeconomic History   Marital status: Married    Spouse name: Not on file   Number of children: Not on file   Years of education: Not on file   Highest education level: Not on file  Occupational History   Not on file  Tobacco Use   Smoking status: Never    Passive exposure: Never   Smokeless tobacco: Never  Vaping Use   Vaping status: Never Used  Substance and  Sexual Activity   Alcohol use: Yes    Comment: Occasionally-very rare   Drug use: Never   Sexual activity: Not Currently    Partners: Male  Other Topics Concern   Not on file  Social History Narrative   Not on file   Social Drivers of Health   Financial Resource Strain: Not on file  Food Insecurity: No Food Insecurity (08/28/2023)   Hunger Vital Sign    Worried About Running Out of Food in the Last Year: Never true    Ran Out of Food in the Last Year: Never true  Transportation Needs: No Transportation Needs (08/28/2023)   PRAPARE - Administrator, Civil Service (Medical): No    Lack of Transportation (Non-Medical): No  Physical Activity: Not on file  Stress: Not on file  Social Connections: Not on file  Intimate Partner Violence: Not At Risk (08/28/2023)   Humiliation, Afraid, Rape, and Kick questionnaire    Fear of Current or Ex-Partner: No    Emotionally Abused: No    Physically Abused: No    Sexually Abused: No      Family History  Problem Relation Age of Onset   CAD Neg Hx     Vitals:   10/02/23 1156  BP: 114/60  Pulse: 85  SpO2: 100%  Weight: 97.3 kg (214 lb 6.4 oz)  Height: 5\' 4"  (1.626 m)     PHYSICAL EXAM: General:  fatigued appearing, moderately obese No respiratory difficulty HEENT: normal Neck: supple. JVD 8-9 cm Carotids 2+ bilat; no bruits. No lymphadenopathy or thyromegaly appreciated. Cor: PMI nondisplaced. Regular rate & rhythm. No rubs, gallops or murmurs. Sternal wound w/ serous drainage from mid incision. No surrounding erythremia  Lungs: decreased BS at  the bases  Abdomen: soft, nontender, nondistended. No hepatosplenomegaly. No bruits or masses. Good bowel sounds. Extremities: no cyanosis, clubbing, rash, 1-2+ b/l LE edema Neuro: alert & oriented x 3, cranial nerves grossly intact. moves all 4 extremities w/o difficulty. Affect pleasant.  ECG: NSR 98 bpm    ASSESSMENT & PLAN:   1. Chronic Systolic Heart Failure  - Echo 2014 w/ normal EF 50-55%, normal RV - Echo 11/24 EF 25-30%, severe MR, RV low normal  - Ischemic CM. LHC w/ severe MVCAD.  - RHC 11/24 c/w decompensated heart failure, severe LV dysfunction, and severe mitral regurgitation (RA 21, PAP 83/37/56, PCWP 36, CO 4.11, CI 2.04) - TEE: EF 25%, RV severely reduced, severe LAE, 3-4+ MR - S/p CABG X 4 (LIMA to LAD, SVG to diagonal, SVG to OM, SVG to acute marginal) + MVR on 11/20 - Post Op Echo 12/02: EF 30-35%, s/p MV repair with trivial MR and mean gradient 5 mmHg.  - NYHA III-IIIb. Volume up on exam w/ 1-2+ b/l LEE. Reports poor urinary response to 40 mg PO Lasix - increase Lasix to 60 mg daily  - Stop Losartan. Start Entresto 24-26 mg bid  - Continue farxiga 10 mg daily - Continue spironolactone 25mg   - Advised to use TED hoses and elevate legs at home. Discussed fluid/sodium restriction    2. H/o Severe MR  - appeared ischemic w/ posterior leaflet tethering on echo 11/24. TEE 3-4+ MR - Now s/p MVR 11/20 at time of CABG on 11/20 - post repair echo w/ trivial MR and mod MS mean gradient 5 mmHg.    3. Severe MVCAD  - S/p CABG X 4 (LIMA to LAD, SVG to diagonal, SVG to OM, SVG to acute marginal) on  11/20 - stable w/o CP  - ASA 81 mg daily  - Crestor 40 mg daily     4. Post Op PAF/Atrial Tach  - NSR on EKG today  - continue amiodarone 200 mg daily  - continue Eliquis. Denies bleeding. Check CBC today   5. Type 2DM  - Hgb A1c 7.8  - Per PCP     6. Sternal Wound - She also reports light drainage from sternal wound. Drainage has been clear. Denies purulent drainage.  Her daughter has been keeping area dry w/ bandages. She has had no associated chest wall pain or erythema. No associated fever and chills. They have made TCTS aware and has f/u in their office tomorrow.  - on my exam, does not appear infected. + serous drainage. No erythema. Will check CBC today to check WBC  - keep surgical f/u tomorrow     F/u in 2 wks w/ APP to reassess volume status   Diana Lis, PA-C 10/02/23

## 2023-10-02 NOTE — Progress Notes (Signed)
cxr 

## 2023-10-02 NOTE — Patient Instructions (Addendum)
Stop Losartan. Start Entresto 24/26 mg twice daily. Increase Lasix to 60 mg (1 and 1/2 pill) daily. Wear your TED hose.  Labs today - we will call you if abnormal. Repeat labs in one week - see below.  Return to Heart Failure APP Clinic in 2 weeks - see below. Please call us at [phone] if any questions or concerns prior to your next visit.

## 2023-10-03 ENCOUNTER — Other Ambulatory Visit: Payer: Self-pay | Admitting: Thoracic Surgery (Cardiothoracic Vascular Surgery)

## 2023-10-03 ENCOUNTER — Encounter: Payer: Self-pay | Admitting: Thoracic Surgery (Cardiothoracic Vascular Surgery)

## 2023-10-03 ENCOUNTER — Ambulatory Visit (INDEPENDENT_AMBULATORY_CARE_PROVIDER_SITE_OTHER): Payer: Self-pay | Admitting: Thoracic Surgery (Cardiothoracic Vascular Surgery)

## 2023-10-03 ENCOUNTER — Ambulatory Visit
Admission: RE | Admit: 2023-10-03 | Discharge: 2023-10-03 | Disposition: A | Discharge status: Home / Self Care | Payer: Medicare HMO | Source: Ambulatory Visit | Attending: Thoracic Surgery (Cardiothoracic Vascular Surgery) | Admitting: Thoracic Surgery (Cardiothoracic Vascular Surgery)

## 2023-10-03 VITALS — BP 127/76 | HR 98 | Resp 20 | Ht 64.0 in | Wt 213.0 lb

## 2023-10-03 DIAGNOSIS — Z9889 Other specified postprocedural states: Secondary | ICD-10-CM

## 2023-10-03 DIAGNOSIS — Z951 Presence of aortocoronary bypass graft: Secondary | ICD-10-CM

## 2023-10-03 MED ORDER — PREDNISONE 10 MG (21) PO TBPK: ORAL_TABLET | ORAL | 0 refills | Status: AC

## 2023-10-03 NOTE — Progress Notes (Signed)
[address]       [name] [address]             [phone]      HPI: Diana [name] returns for a scheduled follow-up visit after recent CABG mitral valve repair.  Diana [name] is a 77 year old woman with a past history of coronary disease, hypertension, hyperlipidemia, type 2 diabetes, obesity, heart failure, pulmonary hypertension, and mitral regurgitation.  She presented with chest pain and shortness of breath.  She was found to have an ejection fraction of 25% with severe mitral regurgitation.  Catheterization she had severe three-vessel coronary disease.  She had severe pulmonary hypertension as well.  She underwent coronary bypass grafting x 4 and mitral valve repair on 09/06/2023.  She had a relatively slow progression postoperatively.  She did have atrial fibrillation requiring amiodarone.  She did go home on apixaban.  Transient increase in creatinine.  She required a thoracentesis for left pleural effusion.  She was discharged home on postoperative day #16.  Over the last few days she has had worsening swelling in her feet.  General decrease in energy.  She has had an intermittent cough.  Shortness of breath is worsened.  She is using oxygen.  Has noted some clear drainage from her sternal wound.  No fevers, chills, or night sweats.  She saw Boyce Medici from the heart failure team yesterday.  She had some medication adjustments including increasing her Lasix and adding Entresto.  Past Medical History:  Diagnosis Date   CAD (coronary artery disease) 7/12-8/12   staged LAD/RCA DES   Diabetes mellitus (HCC)    Diastolic dysfunction 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    Dyslipidemia    HTN (hypertension)     Current Outpatient Medications  Medication Sig Dispense Refill   acetaminophen (TYLENOL) 325 MG tablet Take 2 tablets (650 mg total) by mouth every 6 (six) hours as needed for mild pain (pain score 1-3).     amiodarone (PACERONE) 200 MG tablet Take 1 tablet (200 mg  total) by mouth daily. 30 tablet 1   apixaban (ELIQUIS) 5 MG TABS tablet Take 1 tablet (5 mg total) by mouth 2 (two) times daily. 60 tablet 1   aspirin EC 81 MG tablet Take 1 tablet (81 mg total) by mouth daily. Swallow whole.     BD INSULIN SYRINGE U/F 1/2UNIT 31G X 5/16" 0.3 ML MISC      Beta Carotene (VITAMIN A) 25000 UNIT capsule Take 25,000 Units by mouth daily.     Biotin 5000 MCG CAPS Take 1 capsule by mouth daily.     cyanocobalamin (VITAMIN B12) 1000 MCG tablet Take 2,000 mcg by mouth daily.     dapagliflozin propanediol (FARXIGA) 10 MG TABS tablet Take 1 tablet (10 mg total) by mouth daily. 30 tablet 1   docusate sodium (COLACE) 100 MG capsule Take 100 mg by mouth 2 (two) times daily.     ezetimibe (ZETIA) 10 MG tablet TAKE 1 TABLET BY MOUTH EVERY DAY 90 tablet 2   ferrous sulfate 324 MG TBEC Take 324 mg by mouth daily.     fish oil-omega-3 fatty acids 1000 MG capsule Take 1 g by mouth daily.     furosemide (LASIX) 40 MG tablet Take 1.5 tablets (60 mg total) by mouth daily. 45 tablet 3   gabapentin (NEURONTIN) 100 MG capsule Take 100 mg by mouth See admin instructions. Take 100 mg at bedtime, may take an additional 100 mg  twice daily as needed for pain     insulin NPH-regular Human (NOVOLIN 70/30) (70-30) 100 UNIT/ML injection Inject 60 Units into the skin 2 (two) times daily.     loratadine (CLARITIN) 10 MG tablet Take 10 mg by mouth daily.     Oyster Shell (OYSTER CALCIUM) 500 MG TABS tablet Take 500 mg of elemental calcium by mouth 2 (two) times a week.     pantoprazole (PROTONIX) 40 MG tablet Take 1 tablet by mouth daily. Reported on 03/24/2016     predniSONE (STERAPRED UNI-PAK 21 TAB) 10 MG (21) TBPK tablet Take 6 tablets (60 mg total) by mouth daily for 1 day, THEN 5 tablets (50 mg total) daily for 1 day, THEN 4 tablets (40 mg total) daily for 1 day, THEN 3 tablets (30 mg total) daily for 1 day, THEN 2 tablets (20 mg total) daily for 1 day, THEN 1 tablet (10 mg total) daily for 1 day.  21 tablet 0   rosuvastatin (CRESTOR) 40 MG tablet Take 1 tablet (40 mg total) by mouth daily. 30 tablet 1   sacubitril-valsartan (ENTRESTO) 24-26 MG Take 1 tablet by mouth 2 (two) times daily. 60 tablet 6   Semaglutide,0.25 or 0.5MG /DOS, (OZEMPIC, 0.25 OR 0.5 MG/DOSE,) 2 MG/3ML SOPN Inject 0.5 mg into the skin once a week. 3 mL 2   sennosides-docusate sodium (SENOKOT-S) 8.6-50 MG tablet Take 1 tablet by mouth daily.     spironolactone (ALDACTONE) 25 MG tablet Take 1 tablet (25 mg total) by mouth daily. 30 tablet 1   traMADol (ULTRAM) 50 MG tablet Take 1 tablet (50 mg total) by mouth every 6 (six) hours as needed for severe pain (pain score 7-10). 28 tablet 0   metFORMIN (GLUCOPHAGE) 1000 MG tablet Take 1 tablet (1,000 mg total) by mouth 2 (two) times daily with a meal. (Patient not taking: Reported on 10/03/2023)     No current facility-administered medications for this visit.    Physical Exam BP 127/76 (BP Location: Right Arm, Patient Position: Sitting, Cuff Size: Normal)   Pulse 98   Resp 20   Ht 5\' 4"  (1.626 m)   Wt 213 lb (96.6 kg)   SpO2 90% Comment: RA  BMI 36.56 kg/m  Obese 77 year old woman in no acute distress Increased work of breathing with activity Wearing nasal cannula oxygen Sternal incision with small pinpoint area of opening with fat necrosis type drainage just superior to that there is an area where there is a small fluid collection that is endothelialized.  That was opened with minimal amount of bloody drainage.  Probed with Q-tip and no communication deeper. Cardiac regular rate and rhythm Lungs absent breath sounds on left side Extremities 3+ edema bilaterally  Diagnostic Tests: I personally reviewed her chest x-ray.  Shows a large left pleural effusion.  Impression: Diana [name] is a 77 year old woman with a past history of coronary disease, hypertension, hyperlipidemia, type 2 diabetes, obesity, heart failure, pulmonary hypertension, and mitral regurgitation.   She presented with chest pain and shortness of breath.  She was found to have an ejection fraction of 25% with severe mitral regurgitation.  Catheterization she had severe three-vessel coronary disease.  She had severe pulmonary hypertension as well.  She underwent coronary bypass grafting x 4 and mitral valve repair on 09/06/2023.  Coronary artery disease with ischemic cardiomyopathy and severe mitral regurgitation-status post CABG and mitral repair.  Sternal wound-she has 1 open area in the second area that I opened in the office.  Both  had fatty necrosis appearing drainage.  No purulence.  Her daughter is sophisticated and has been managing the wound with dressing changes.  She will continue to do so.  I see no indication for antibiotics at this point but they know to call if there is any concerns for infection.  Postoperative atrial fibrillation-on amiodarone and apixaban.  Postoperative left pleural effusion-she had a thoracentesis x 1 in the hospital.  She has a large left pleural effusion occupying about two thirds of her volume of her left chest.  She needs another thoracentesis.  We will arrange for that.  Will give her a prednisone taper afterwards.  I do not think they will be able to drain all the fluid 1 thoracentesis so she will likely need a second 1.  No heavy lifting or driving currently.   Plan: Medication changes per heart failure team Ultrasound-guided thoracentesis to drain as much fluid as possible.  Likely will require more than 1 thoracentesis Prednisone taper postthoracentesis Local wound care.  Patient and her daughter know to call immediately if there is any concern redness, further skin breakdown, or purulent drainage Return in 1 week with chest x-ray  [name], MD [facility] [phone]

## 2023-10-04 ENCOUNTER — Other Ambulatory Visit (HOSPITAL_BASED_OUTPATIENT_CLINIC_OR_DEPARTMENT_OTHER): Payer: Self-pay

## 2023-10-04 ENCOUNTER — Other Ambulatory Visit: Payer: Self-pay | Admitting: Thoracic Surgery (Cardiothoracic Vascular Surgery)

## 2023-10-04 DIAGNOSIS — Z951 Presence of aortocoronary bypass graft: Secondary | ICD-10-CM

## 2023-10-04 LAB — ECHO TEE
MV M vel: 4.76 m/s
MV Peak grad: 90.6 mm[Hg]
Radius: 0.8 cm

## 2023-10-04 MED ORDER — OZEMPIC (0.25 OR 0.5 MG/DOSE) 2 MG/3ML ~~LOC~~ SOPN
PEN_INJECTOR | SUBCUTANEOUS | 5 refills | Status: DC
  Filled 2023-10-04: qty 3, 56d supply, fill #0
  Filled 2024-01-10: qty 3, 28d supply, fill #0
  Filled 2024-02-15: qty 3, 28d supply, fill #1

## 2023-10-09 ENCOUNTER — Ambulatory Visit (HOSPITAL_COMMUNITY)
Admission: RE | Admit: 2023-10-09 | Discharge: 2023-10-09 | Disposition: A | Discharge status: Home / Self Care | Payer: Medicare HMO | Source: Ambulatory Visit | Attending: Thoracic Surgery (Cardiothoracic Vascular Surgery) | Admitting: Thoracic Surgery (Cardiothoracic Vascular Surgery)

## 2023-10-09 ENCOUNTER — Ambulatory Visit (HOSPITAL_COMMUNITY)
Admission: RE | Admit: 2023-10-09 | Discharge: 2023-10-09 | Disposition: A | Discharge status: Home / Self Care | Payer: Medicare HMO | Source: Ambulatory Visit | Attending: Student

## 2023-10-09 VITALS — BP 121/89

## 2023-10-09 DIAGNOSIS — J90 Pleural effusion, not elsewhere classified: Secondary | ICD-10-CM | POA: Insufficient documentation

## 2023-10-09 DIAGNOSIS — Z9889 Other specified postprocedural states: Secondary | ICD-10-CM

## 2023-10-09 HISTORY — PX: IR THORACENTESIS ASP PLEURAL SPACE W/IMG GUIDE: IMG5380

## 2023-10-09 MED ORDER — LIDOCAINE HCL 1 % IJ SOLN
20.0000 mL | Freq: Once | INTRAMUSCULAR | Status: AC
  Administered 2023-10-09: 10 mL

## 2023-10-09 MED ORDER — LIDOCAINE HCL 1 % IJ SOLN
INTRAMUSCULAR | Status: AC
  Filled 2023-10-09: qty 20

## 2023-10-09 NOTE — Procedures (Signed)
PROCEDURE SUMMARY:  Successful US guided left thoracentesis. Yielded 1.4 L of amber colored fluid. Pt tolerated procedure well. No immediate complications.  Specimen not sent for labs. CXR ordered; no post-procedure pneumothorax identified.   EBL < 2 mL  Mickie Kay, NP 10/09/2023 3:04 PM

## 2023-10-12 ENCOUNTER — Ambulatory Visit
Admission: RE | Admit: 2023-10-12 | Discharge: 2023-10-12 | Disposition: A | Discharge status: Home / Self Care | Payer: Medicare HMO | Source: Ambulatory Visit | Attending: Thoracic Surgery (Cardiothoracic Vascular Surgery) | Admitting: Thoracic Surgery (Cardiothoracic Vascular Surgery)

## 2023-10-12 ENCOUNTER — Ambulatory Visit: Payer: Self-pay | Admitting: Thoracic Surgery (Cardiothoracic Vascular Surgery)

## 2023-10-12 ENCOUNTER — Encounter: Payer: Self-pay | Admitting: Thoracic Surgery (Cardiothoracic Vascular Surgery)

## 2023-10-12 VITALS — BP 97/62 | HR 85 | Resp 20 | Ht 64.0 in | Wt 213.0 lb

## 2023-10-12 DIAGNOSIS — J90 Pleural effusion, not elsewhere classified: Secondary | ICD-10-CM

## 2023-10-12 DIAGNOSIS — Z951 Presence of aortocoronary bypass graft: Secondary | ICD-10-CM

## 2023-10-12 DIAGNOSIS — Z9889 Other specified postprocedural states: Secondary | ICD-10-CM

## 2023-10-12 MED ORDER — TRAMADOL HCL 50 MG PO TABS: 50.0000 mg | ORAL_TABLET | Freq: Four times a day (QID) | ORAL | 0 refills | Status: DC | PRN

## 2023-10-12 NOTE — Progress Notes (Signed)
[address]       [name] [address]             [phone]     HPI: [name] returns for a scheduled follow-up visit  Diana [name] is a 77 year old woman with a history of coronary disease, hypertension, hyperlipidemia, type 2 diabetes, obesity, heart failure, pulmonary hypertension, and mitral regurgitation.  Presented with chest pain and shortness of breath and was found to have an ejection fraction of 25% with severe mitral regurgitation.  Catheterization revealed severe three-vessel disease.  Also noted to have severe pulmonary hypertension.    She underwent coronary bypass grafting x 4 with mitral valve repair on 09/06/2023.  Postoperatively she had a low progression.  She had atrial fibrillation requiring amiodarone.  She went home on apixaban.  Had a transient increase in creatinine but that came back to baseline.  Had thoracentesis for pleural effusion.  Ultimately went home on day 16.  I saw her back on 10/03/2023.  She just seen Boyce Medici from cardiology and been started on [place].  Her Lasix was increased from 40 to 60 mg daily.  She had a rather large left pleural effusion so we sent her for a thoracentesis.  That was done on 10/09/2023.  They drained 1.4 L.  She feels better.  She is feels like she is starting to get a little more energy.  Her exercise tolerance is still limited but is improving.  Has some incisional pain, but generally only takes a pain pill at night before she goes to bed.  She occasionally uses 1 during the day.  Still has swelling in her legs.  Past Medical History:  Diagnosis Date   CAD (coronary artery disease) 7/12-8/12   staged LAD/RCA DES   Diabetes mellitus (HCC)    Diastolic dysfunction 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    Dyslipidemia    HTN (hypertension)     Current Outpatient Medications  Medication Sig Dispense Refill   acetaminophen (TYLENOL) 325 MG tablet Take 2 tablets (650 mg total) by mouth every 6 (six) hours as  needed for mild pain (pain score 1-3).     amiodarone (PACERONE) 200 MG tablet Take 1 tablet (200 mg total) by mouth daily. 30 tablet 1   apixaban (ELIQUIS) 5 MG TABS tablet Take 1 tablet (5 mg total) by mouth 2 (two) times daily. 60 tablet 1   aspirin EC 81 MG tablet Take 1 tablet (81 mg total) by mouth daily. Swallow whole.     BD INSULIN SYRINGE U/F 1/2UNIT 31G X 5/16" 0.3 ML MISC      Beta Carotene (VITAMIN A) 25000 UNIT capsule Take 25,000 Units by mouth daily.     Biotin 5000 MCG CAPS Take 1 capsule by mouth daily.     cyanocobalamin (VITAMIN B12) 1000 MCG tablet Take 2,000 mcg by mouth daily.     dapagliflozin propanediol (FARXIGA) 10 MG TABS tablet Take 1 tablet (10 mg total) by mouth daily. 30 tablet 1   docusate sodium (COLACE) 100 MG capsule Take 100 mg by mouth 2 (two) times daily.     ezetimibe (ZETIA) 10 MG tablet TAKE 1 TABLET BY MOUTH EVERY DAY 90 tablet 2   ferrous sulfate 324 MG TBEC Take 324 mg by mouth daily.     fish oil-omega-3 fatty acids 1000 MG capsule Take 1 g by mouth daily.     furosemide (LASIX) 40 MG tablet Take 1.5 tablets (60 mg total)  by mouth daily. 45 tablet 3   gabapentin (NEURONTIN) 100 MG capsule Take 100 mg by mouth See admin instructions. Take 100 mg at bedtime, may take an additional 100 mg twice daily as needed for pain     insulin NPH-regular Human (NOVOLIN 70/30) (70-30) 100 UNIT/ML injection Inject 60 Units into the skin 2 (two) times daily.     loratadine (CLARITIN) 10 MG tablet Take 10 mg by mouth daily.     Oyster Shell (OYSTER CALCIUM) 500 MG TABS tablet Take 500 mg of elemental calcium by mouth 2 (two) times a week.     OZEMPIC, 0.25 OR 0.5 MG/DOSE, 2 MG/3ML SOPN Inject 0.25 mg into the skin once a week for 28 days, THEN 0.5 mg once a week for 28 days. 3 mL 5   pantoprazole (PROTONIX) 40 MG tablet Take 1 tablet by mouth daily. Reported on 03/24/2016     sacubitril-valsartan (ENTRESTO) 24-26 MG Take 1 tablet by mouth 2 (two) times daily. 60 tablet 6    sennosides-docusate sodium (SENOKOT-S) 8.6-50 MG tablet Take 1 tablet by mouth daily.     spironolactone (ALDACTONE) 25 MG tablet Take 1 tablet (25 mg total) by mouth daily. 30 tablet 1   metFORMIN (GLUCOPHAGE) 1000 MG tablet Take 1 tablet (1,000 mg total) by mouth 2 (two) times daily with a meal. (Patient not taking: Reported on 10/03/2023)     rosuvastatin (CRESTOR) 40 MG tablet Take 1 tablet (40 mg total) by mouth daily. 30 tablet 1   Semaglutide,0.25 or 0.5MG /DOS, (OZEMPIC, 0.25 OR 0.5 MG/DOSE,) 2 MG/3ML SOPN Inject 0.5 mg into the skin once a week. 3 mL 2   traMADol (ULTRAM) 50 MG tablet Take 1 tablet (50 mg total) by mouth every 6 (six) hours as needed for severe pain (pain score 7-10). 28 tablet 0   No current facility-administered medications for this visit.    Physical Exam BP 97/62   Pulse 85   Resp 20   Ht 5\' 4"  (1.626 m)   Wt 213 lb (96.6 kg)   SpO2 90% Comment: RA  BMI 36.20 kg/m  77 year old woman in no acute distress Alert and oriented x 3 with no focal deficits Lungs slightly diminished on left Cardiac regular rate and rhythm Sternal wound with minimal serous drainage in the midportion.  No significant erythema.  Sternum stable 3+ edema both lower extremities  Diagnostic Tests: CHEST - 2 VIEW   COMPARISON:  10/09/2023   FINDINGS: Stable cardiomegaly status post sternotomy, CABG, and cardiac valve replacement. Small left pleural effusion, slightly decreased. Left basilar opacity with slightly improving aeration. Right lung is clear. No pneumothorax.   IMPRESSION: 1. Small left pleural effusion, slightly decreased. 2. Left basilar opacity with slightly improving aeration.     Electronically Signed   By: Duanne Guess D.O.   On: 10/12/2023 12:04  I personally reviewed the chest x-ray images.  Significant improvement in left pleural effusion but still some loculated fluid.  Impression: Diana [name] is a 78 year old woman with a history of coronary  disease, hypertension, hyperlipidemia, type 2 diabetes, obesity, heart failure, pulmonary hypertension, and mitral regurgitation.  Presented with chest pain and shortness of breath and was found to have an ejection fraction of 25% with severe mitral regurgitation.  Catheterization revealed severe three-vessel disease.  Also noted to have severe pulmonary hypertension.    Three-vessel coronary disease with ischemic cardiomyopathy and severe mitral regurgitation-now about a month out from coronary bypass grafting and mitral valve repair.  She  is making progress, albeit slowly.  She is still volume overloaded.  She is currently on 60 mg of Lasix daily.  I like to increase that but her blood pressure is pretty low already.  Will hold off on adjusting her diuretics.  In the meantime she can try elevating her legs more and wearing compression stockings which may help to some degree.  Left pleural effusion-she started a prednisone taper after the fluid was drained.  She does feel better symptomatically.  Will plan to see her back in 2 weeks and repeat a chest x-ray.  She is almost out of pain medication.  Will represcribe tramadol 50 mg tablets, 1 p.o. every 6 hours as needed, 28 tablets, no refills.  Hopefully will be able to taper off of those over the next month or so.  Plan: Complete prednisone taper Local wound care to sternal incision Elevate legs and wear compression hose Return in 2 weeks to check on progress.  Will do a PA and lateral chest x-ray  [name], MD [facility] [phone]

## 2023-10-16 ENCOUNTER — Other Ambulatory Visit (HOSPITAL_BASED_OUTPATIENT_CLINIC_OR_DEPARTMENT_OTHER): Payer: Self-pay

## 2023-10-16 ENCOUNTER — Ambulatory Visit (HOSPITAL_COMMUNITY)
Admission: RE | Admit: 2023-10-16 | Discharge: 2023-10-16 | Disposition: A | Discharge status: Home / Self Care | Payer: Medicare HMO | Source: Ambulatory Visit | Attending: Cardiology | Admitting: Cardiology

## 2023-10-16 DIAGNOSIS — I5189 Other ill-defined heart diseases: Secondary | ICD-10-CM | POA: Diagnosis not present

## 2023-10-16 DIAGNOSIS — Z01812 Encounter for preprocedural laboratory examination: Secondary | ICD-10-CM | POA: Insufficient documentation

## 2023-10-16 LAB — BASIC METABOLIC PANEL
Anion gap: 12 (ref 5–15)
BUN: 18 mg/dL (ref 8–23)
CO2: 27 mmol/L (ref 22–32)
Calcium: 8.9 mg/dL (ref 8.9–10.3)
Chloride: 96 mmol/L — ABNORMAL LOW (ref 98–111)
Creatinine, Ser: 1.19 mg/dL — ABNORMAL HIGH (ref 0.44–1.00)
GFR, Estimated: 48 mL/min — ABNORMAL LOW (ref >60)
Glucose, Bld: 93 mg/dL (ref 70–99)
Potassium: 4.5 mmol/L (ref 3.5–5.1)
Sodium: 135 mmol/L (ref 135–145)

## 2023-10-17 ENCOUNTER — Other Ambulatory Visit (HOSPITAL_COMMUNITY): Payer: Self-pay | Admitting: Cardiology

## 2023-10-17 ENCOUNTER — Other Ambulatory Visit (HOSPITAL_COMMUNITY): Payer: Self-pay | Admitting: Pharmacist

## 2023-10-17 ENCOUNTER — Other Ambulatory Visit (HOSPITAL_COMMUNITY): Payer: Self-pay

## 2023-10-17 MED ORDER — AMIODARONE HCL 200 MG PO TABS: 200.0000 mg | ORAL_TABLET | Freq: Every day | ORAL | 5 refills | Status: DC

## 2023-10-17 MED ORDER — SPIRONOLACTONE 25 MG PO TABS: 25.0000 mg | ORAL_TABLET | Freq: Every day | ORAL | 5 refills | Status: DC

## 2023-10-17 MED ORDER — DAPAGLIFLOZIN PROPANEDIOL 10 MG PO TABS: 10.0000 mg | ORAL_TABLET | Freq: Every day | ORAL | 5 refills | Status: DC

## 2023-10-17 MED ORDER — APIXABAN 5 MG PO TABS: 5.0000 mg | ORAL_TABLET | Freq: Two times a day (BID) | ORAL | 5 refills | Status: DC

## 2023-10-20 ENCOUNTER — Other Ambulatory Visit: Payer: Self-pay | Admitting: *Deleted

## 2023-10-20 ENCOUNTER — Ambulatory Visit (HOSPITAL_COMMUNITY): Payer: Medicare HMO | Attending: Cardiovascular Disease

## 2023-10-20 ENCOUNTER — Telehealth: Payer: Self-pay | Admitting: *Deleted

## 2023-10-20 DIAGNOSIS — Z9889 Other specified postprocedural states: Secondary | ICD-10-CM | POA: Diagnosis not present

## 2023-10-20 DIAGNOSIS — I255 Ischemic cardiomyopathy: Secondary | ICD-10-CM | POA: Insufficient documentation

## 2023-10-20 LAB — ECHOCARDIOGRAM COMPLETE
AR max vel: 1.56 cm2
AV Area VTI: 1.56 cm2
AV Area mean vel: 1.58 cm2
AV Mean grad: 11 mm[Hg]
AV Peak grad: 22.3 mm[Hg]
Ao pk vel: 2.36 m/s
Area-P 1/2: 2.96 cm2
P 1/2 time: 254 ms
S' Lateral: 4.3 cm

## 2023-10-20 MED ORDER — PREDNISONE 10 MG (21) PO TBPK: ORAL_TABLET | ORAL | 0 refills | Status: AC

## 2023-10-20 NOTE — Telephone Encounter (Signed)
 Patient's daughter Herbert Seta contacted the office requesting a refill of prednisone. Per Dr. Dorris Fetch, refill of prednisone taper sent to patient's pharmacy. Heather aware.

## 2023-10-23 NOTE — Progress Notes (Addendum)
 Advanced Heart Failure Clinic Note   Referring Physician: PCP: Barbra Odor, NP PCP-Cardiologist: Debby Sor, MD  [facility]: Dr. Cherrie  Chief Complaint: Heart Failure follow up  HPI:  78 y/o female w/ h/o CAD s/p remote LAD and RCA PCI in 2011, Type 2DM and HLD, recently diagnosed w/ new systolic heart failure and severe MR by echo 11/24. 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 Severe MR also confirmed by TEE. AHF team  was consulted for HF optimization prior to CABG + MVR. She was optimized w/ milrinone  and diuresed w/ IV Lasix . Underwent CABG x4 (LIMA to LAD, SVG to diagonal, SVG to OM, SVG to acute marginal) + MVR by Dr. Kerrin. Weaned off pressors and transitioned to oral GDMT. Also developed PAF post op and placed on amio. Discharged home on 12/5.   Presented 10/02/23 for post hospital f/u to establish care with [facility]. Volume overloaded w/ 1-2+ b/l Dottie Vaquerano with DOE. Compliant with all medications. Although UOP not robust on Lasix  so transitioned to Torsemide. Noted to have incisional drainage with plans to see CTS.  Of note, CXR with large L pleural effusion and underwent thoracentesis on 10/09/23 with removal of 1.4L pleural fluid and started on pred taper.  Today she returns for HF follow up with her daughter. Overall feeling much better. She is working with PT/OT at home and gaining more independence. Reports SOB with over exertion, none at rest. Intermittent what sounds like nerve pain near chest incision. Daughter has been changing dressings due to continues drainage, did have some bloody drainage this morning that self resolved. Will see TCTS tomorrow for follow up. No dizziness. Edema in lower extremity is still up but stating it is much improved. Appetite improving. Weight is down 10 lbs. Remains on pred taper for 2 more days. Taking all medications. Reports great response to torsemide.     Past Medical History:  Diagnosis Date   CAD (coronary artery disease) 7/12-8/12   staged LAD/RCA DES   Diabetes mellitus (HCC)    Diastolic dysfunction 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    Dyslipidemia    HTN (hypertension)     Current Outpatient Medications  Medication Sig Dispense Refill   acetaminophen  (TYLENOL ) 325 MG tablet Take 2 tablets (650 mg total) by mouth every 6 (six) hours as needed for mild pain (pain score 1-3).     amiodarone  (PACERONE ) 200 MG tablet Take 1 tablet (200 mg total) by mouth daily. 30 tablet 5   apixaban  (ELIQUIS ) 5 MG TABS tablet Take 1 tablet (5 mg total) by mouth 2 (two) times daily. 60 tablet 5   aspirin  EC 81 MG tablet Take 1 tablet (81 mg total) by mouth daily. Swallow whole.     BD INSULIN  SYRINGE U/F 1/2UNIT 31G X 5/16 0.3 ML MISC      Beta Carotene (VITAMIN A) 25000 UNIT capsule Take 25,000 Units by mouth daily.     Biotin 5000 MCG CAPS Take 1 capsule by mouth daily.     cyanocobalamin (VITAMIN B12) 1000 MCG tablet Take 2,000 mcg by mouth daily.     dapagliflozin  propanediol (FARXIGA ) 10 MG TABS tablet Take 1 tablet (10 mg total) by mouth daily. 30 tablet 5   docusate sodium  (COLACE) 100 MG capsule Take 100 mg by mouth 2 (two) times daily.     ezetimibe  (ZETIA ) 10 MG tablet TAKE 1 TABLET BY MOUTH EVERY DAY 90 tablet 2  ferrous sulfate 324 MG TBEC Take 324 mg by mouth daily.     fish oil-omega-3 fatty acids 1000 MG capsule Take 1 g by mouth daily.     furosemide  (LASIX ) 40 MG tablet Take 1.5 tablets (60 mg total) by mouth daily. 45 tablet 3   gabapentin  (NEURONTIN ) 100 MG capsule Take 100 mg by mouth 2 (two) times daily. Take 100 mg at  morning and bedtime     insulin  NPH-regular Human (NOVOLIN 70/30) (70-30) 100 UNIT/ML injection Inject 30-60 Units into the skin 2 (two) times daily.     loratadine  (CLARITIN ) 10 MG tablet Take 10 mg by mouth daily.     Oyster Shell (OYSTER CALCIUM ) 500 MG TABS tablet Take 500 mg of elemental calcium  by mouth 2 (two) times a  week.     pantoprazole  (PROTONIX ) 40 MG tablet Take 1 tablet by mouth daily. Reported on 03/24/2016     predniSONE  (STERAPRED UNI-PAK 21 TAB) 10 MG (21) TBPK tablet Take 6 tablets (60 mg total) by mouth daily for 1 day, THEN 5 tablets (50 mg total) daily for 1 day, THEN 4 tablets (40 mg total) daily for 1 day, THEN 3 tablets (30 mg total) daily for 1 day, THEN 2 tablets (20 mg total) daily for 1 day, THEN 1 tablet (10 mg total) daily for 1 day. 21 tablet 0   sacubitril -valsartan  (ENTRESTO ) 24-26 MG Take 1 tablet by mouth 2 (two) times daily. 60 tablet 6   sennosides-docusate sodium  (SENOKOT-S) 8.6-50 MG tablet Take 1 tablet by mouth daily.     spironolactone  (ALDACTONE ) 25 MG tablet Take 1 tablet (25 mg total) by mouth daily. 30 tablet 5   traMADol  (ULTRAM ) 50 MG tablet Take 1 tablet (50 mg total) by mouth every 6 (six) hours as needed for severe pain (pain score 7-10). 28 tablet 0   OZEMPIC , 0.25 OR 0.5 MG/DOSE, 2 MG/3ML SOPN Inject 0.25 mg into the skin once a week for 28 days, THEN 0.5 mg once a week for 28 days. (Patient not taking: Reported on 10/24/2023) 3 mL 5   Semaglutide ,0.25 or 0.5MG /DOS, (OZEMPIC , 0.25 OR 0.5 MG/DOSE,) 2 MG/3ML SOPN Inject 0.5 mg into the skin once a week. (Patient not taking: Reported on 10/24/2023) 3 mL 2   No current facility-administered medications for this encounter.    Allergies  Allergen Reactions   Lisinopril Cough   Sulfa Antibiotics Hives      Social History   Socioeconomic History   Marital status: Married    Spouse name: Not on file   Number of children: Not on file   Years of education: Not on file   Highest education level: Not on file  Occupational History   Not on file  Tobacco Use   Smoking status: Never    Passive exposure: Never   Smokeless tobacco: Never  Vaping Use   Vaping status: Never Used  Substance and Sexual Activity   Alcohol use: Yes    Comment: Occasionally-very rare   Drug use: Never   Sexual activity: Not Currently     Partners: Male  Other Topics Concern   Not on file  Social History Narrative   Not on file   Social Drivers of Health   Financial Resource Strain: Not on file  Food Insecurity: Low Risk  (10/04/2023)   Received from [facility]   Hunger Vital Sign    Worried About Running Out of Food in the Last Year: Never true    Ran Out  of Food in the Last Year: Never true  Transportation Needs: No Transportation Needs (10/04/2023)   Received from [facility]    In the past 12 months, has lack of reliable transportation kept you from medical appointments, meetings, work or from getting things needed for daily living? : No  Physical Activity: Not on file  Stress: Not on file  Social Connections: Not on file  Intimate Partner Violence: Not At Risk (08/28/2023)   Humiliation, Afraid, Rape, and Kick questionnaire    Fear of Current or Ex-Partner: No    Emotionally Abused: No    Physically Abused: No    Sexually Abused: No      Family History  Problem Relation Age of Onset   CAD Neg Hx     Vitals:   10/24/23 1139  BP: (!) 102/56  Pulse: 97  SpO2: 96%  Weight: 88.3 kg (194 lb 9.6 oz)    Filed Weights   10/24/23 1139  Weight: 88.3 kg (194 lb 9.6 oz)     PHYSICAL EXAM: General: Well appearing. No distress on RA. Ambulating with rollator HEENT: neck supple.   Cardiac: JVP to midneck. S1 and S2 present. No murmurs or rub. Resp: Lung sounds diminished throughout Abdomen: Soft, non-tender, non-distended. + BS. Extremities: Warm and dry. No rash, cyanosis.  3+ BLE  Neuro: Alert and oriented x3. Affect pleasant. Moves all extremities without difficulty.   ASSESSMENT & PLAN:  1. Chronic Systolic Heart Failure  - Echo 2014 w/ normal EF 50-55%, normal RV - Echo 11/24 EF 25-30%, severe MR, RV low normal  - Ischemic CM. LHC w/ severe MVCAD.  - RHC 11/24 c/w decompensated heart failure, severe LV dysfunction, and severe mitral regurgitation (RA 21, PAP 83/37/56,  PCWP 36, CO 4.11, CI 2.04) - TEE: EF 25%, RV severely reduced, severe LAE, 3-4+ MR - S/p CABG X 4 (LIMA to LAD, SVG to diagonal, SVG to OM, SVG to acute marginal) + MVR on 11/20 - Post Op Echo 12/02: EF 30-35%, s/p MV repair with trivial MR and mean gradient 5 mmHg.  - NYHA III-IIIb. Volume up on exam w/ 1-2+ b/l Maysen Bonsignore. Reports poor urinary response to 40 mg PO Lasix  - Continue Lasix  to 60 mg daily - Continue Entresto  24-26 mg bid. BMET/BNP today. - Continue farxiga  10 mg daily - Continue spironolactone  25 mg  - BP to low to start BB - Advised to use TED hoses and elevate legs at home. Discussed fluid/sodium restriction  - Waiting for TCTS to clear for cardiac rehab - Repeat echo in 3 month   2. H/o Severe MR  - appeared ischemic w/ posterior leaflet tethering on echo 11/24. TEE 3-4+ MR - Now s/p MVR 11/20 at time of CABG on 11/20 - post repair echo w/ trivial MR and mod MS mean gradient 5 mmHg.    3. Severe MVCAD  - S/p CABG X 4 (LIMA to LAD, SVG to diagonal, SVG to OM, SVG to acute marginal) on 11/20 - stable w/o CP  - ASA 81 mg daily  - Crestor  40 mg daily   4. Post Op PAF/Atrial Tach  - regular rhythm on exam - continue amiodarone  200 mg daily  - continue Eliquis . Denies bleeding. Check CBC today   5. Type 2DM  - Hgb A1c 7.8  - Per PCP   6. Sternal Wound - Light drainage from sternal wound. Drainage is serous/yellow. Thought to be fatty necrosis per TCTS. Her daughter has been changing  dry dressings. No chest wall pain. No fever or chills.  - Episode of bloody drainage this morning that self resolved. Check CBC.  - keep surgical f/u tomorrow    F/u in 3 wks w/ APP to reassess volume status and attempt to add beta blocker F/u in 3 months with Dr. Cherrie + Echo  Vincenzo Stave, NP 10/24/23

## 2023-10-24 ENCOUNTER — Ambulatory Visit (HOSPITAL_COMMUNITY)
Admission: RE | Admit: 2023-10-24 | Discharge: 2023-10-24 | Disposition: A | Discharge status: Home / Self Care | Payer: Medicare HMO | Source: Ambulatory Visit | Attending: Family Medicine | Admitting: Family Medicine

## 2023-10-24 ENCOUNTER — Other Ambulatory Visit: Payer: Self-pay | Admitting: Thoracic Surgery (Cardiothoracic Vascular Surgery)

## 2023-10-24 ENCOUNTER — Telehealth (HOSPITAL_COMMUNITY): Payer: Self-pay | Admitting: *Deleted

## 2023-10-24 ENCOUNTER — Encounter (HOSPITAL_COMMUNITY): Payer: Self-pay

## 2023-10-24 VITALS — BP 102/56 | HR 97 | Wt 194.6 lb

## 2023-10-24 DIAGNOSIS — Z951 Presence of aortocoronary bypass graft: Secondary | ICD-10-CM | POA: Diagnosis not present

## 2023-10-24 DIAGNOSIS — Z7985 Long-term (current) use of injectable non-insulin antidiabetic drugs: Secondary | ICD-10-CM | POA: Insufficient documentation

## 2023-10-24 DIAGNOSIS — Z7901 Long term (current) use of anticoagulants: Secondary | ICD-10-CM | POA: Diagnosis not present

## 2023-10-24 DIAGNOSIS — E785 Hyperlipidemia, unspecified: Secondary | ICD-10-CM | POA: Insufficient documentation

## 2023-10-24 DIAGNOSIS — Z9889 Other specified postprocedural states: Secondary | ICD-10-CM

## 2023-10-24 DIAGNOSIS — I5022 Chronic systolic (congestive) heart failure: Secondary | ICD-10-CM | POA: Insufficient documentation

## 2023-10-24 DIAGNOSIS — E119 Type 2 diabetes mellitus without complications: Secondary | ICD-10-CM | POA: Insufficient documentation

## 2023-10-24 DIAGNOSIS — Z952 Presence of prosthetic heart valve: Secondary | ICD-10-CM | POA: Insufficient documentation

## 2023-10-24 DIAGNOSIS — T8189XA Other complications of procedures, not elsewhere classified, initial encounter: Secondary | ICD-10-CM

## 2023-10-24 DIAGNOSIS — I110 Hypertensive heart disease with heart failure: Secondary | ICD-10-CM | POA: Insufficient documentation

## 2023-10-24 DIAGNOSIS — Z7982 Long term (current) use of aspirin: Secondary | ICD-10-CM | POA: Diagnosis not present

## 2023-10-24 DIAGNOSIS — I480 Paroxysmal atrial fibrillation: Secondary | ICD-10-CM | POA: Insufficient documentation

## 2023-10-24 DIAGNOSIS — I340 Nonrheumatic mitral (valve) insufficiency: Secondary | ICD-10-CM | POA: Insufficient documentation

## 2023-10-24 DIAGNOSIS — I2510 Atherosclerotic heart disease of native coronary artery without angina pectoris: Secondary | ICD-10-CM | POA: Diagnosis not present

## 2023-10-24 DIAGNOSIS — Z955 Presence of coronary angioplasty implant and graft: Secondary | ICD-10-CM | POA: Insufficient documentation

## 2023-10-24 DIAGNOSIS — Z794 Long term (current) use of insulin: Secondary | ICD-10-CM

## 2023-10-24 DIAGNOSIS — I255 Ischemic cardiomyopathy: Secondary | ICD-10-CM | POA: Insufficient documentation

## 2023-10-24 DIAGNOSIS — Z79899 Other long term (current) drug therapy: Secondary | ICD-10-CM | POA: Insufficient documentation

## 2023-10-24 DIAGNOSIS — R239 Unspecified skin changes: Secondary | ICD-10-CM

## 2023-10-24 LAB — CBC
HCT: 34.3 % — ABNORMAL LOW (ref 36.0–46.0)
Hemoglobin: 10.3 g/dL — ABNORMAL LOW (ref 12.0–15.0)
MCH: 24.8 pg — ABNORMAL LOW (ref 26.0–34.0)
MCHC: 30 g/dL (ref 30.0–36.0)
MCV: 82.7 fL (ref 80.0–100.0)
Platelets: 807 10*3/uL — ABNORMAL HIGH (ref 150–400)
RBC: 4.15 MIL/uL (ref 3.87–5.11)
RDW: 17.6 % — ABNORMAL HIGH (ref 11.5–15.5)
WBC: 14.1 10*3/uL — ABNORMAL HIGH (ref 4.0–10.5)
nRBC: 0 % (ref 0.0–0.2)

## 2023-10-24 LAB — BASIC METABOLIC PANEL
Anion gap: 12 (ref 5–15)
BUN: 30 mg/dL — ABNORMAL HIGH (ref 8–23)
CO2: 29 mmol/L (ref 22–32)
Calcium: 9 mg/dL (ref 8.9–10.3)
Chloride: 97 mmol/L — ABNORMAL LOW (ref 98–111)
Creatinine, Ser: 1.13 mg/dL — ABNORMAL HIGH (ref 0.44–1.00)
GFR, Estimated: 51 mL/min — ABNORMAL LOW (ref >60)
Glucose, Bld: 96 mg/dL (ref 70–99)
Potassium: 4.1 mmol/L (ref 3.5–5.1)
Sodium: 138 mmol/L (ref 135–145)

## 2023-10-24 LAB — BRAIN NATRIURETIC PEPTIDE: B Natriuretic Peptide: 715.4 pg/mL — ABNORMAL HIGH (ref 0.0–100.0)

## 2023-10-24 NOTE — Addendum Note (Signed)
 Encounter addended by: Tiauna Whisnant, [place], NP on: 10/24/2023 1:56 PM  Actions taken: Clinical Note Signed

## 2023-10-24 NOTE — Patient Instructions (Signed)
 No change in medications. Labs today - will call you if abnormal. Return to Heart Failure APP Clinic in 3 weeks - see below. Return to see Dr. Cherrie in Heart Failure Clinic with echo in 3 months. Please call us  at [phone] in February to schedule this appointment. Please call us  at [phone] if any questions or concerns prior to your next appointment.

## 2023-10-24 NOTE — Progress Notes (Signed)
 ReDS Vest / Clip - 10/24/23 1100       ReDS Vest / Clip   Station Marker A    Ruler Value 30    ReDS Value Range Low volume    ReDS Actual Value 33

## 2023-10-24 NOTE — Telephone Encounter (Signed)
 Called patient per [place] Lee, PA with following lab results and instructions:  "WBC and PLTs elevated. On prednisone. Continue to monitor for signs and symptoms of infection. Keep TCTS appointment tomorrow."  Pt verbalized understanding of same.

## 2023-10-25 ENCOUNTER — Ambulatory Visit: Payer: Self-pay | Admitting: Thoracic Surgery (Cardiothoracic Vascular Surgery)

## 2023-10-31 ENCOUNTER — Ambulatory Visit
Admission: RE | Admit: 2023-10-31 | Discharge: 2023-10-31 | Disposition: A | Discharge status: Home / Self Care | Payer: Medicare HMO | Source: Ambulatory Visit | Attending: Thoracic Surgery (Cardiothoracic Vascular Surgery) | Admitting: Thoracic Surgery (Cardiothoracic Vascular Surgery)

## 2023-10-31 ENCOUNTER — Ambulatory Visit (INDEPENDENT_AMBULATORY_CARE_PROVIDER_SITE_OTHER): Payer: Self-pay | Admitting: Thoracic Surgery (Cardiothoracic Vascular Surgery)

## 2023-10-31 ENCOUNTER — Other Ambulatory Visit: Payer: Self-pay

## 2023-10-31 VITALS — BP 113/71 | HR 117 | Resp 20 | Ht 64.0 in | Wt 189.0 lb

## 2023-10-31 DIAGNOSIS — J90 Pleural effusion, not elsewhere classified: Secondary | ICD-10-CM

## 2023-10-31 DIAGNOSIS — Z951 Presence of aortocoronary bypass graft: Secondary | ICD-10-CM

## 2023-10-31 DIAGNOSIS — Z9889 Other specified postprocedural states: Secondary | ICD-10-CM

## 2023-10-31 NOTE — Progress Notes (Signed)
 [address]       [name] [address]             [phone]      HPI: Diana [name] returns for a scheduled postoperative follow-up  Diana [name] is a 78 year old woman with a history of CAD, hypertension, hyperlipidemia, type 2 diabetes, obesity, heart failure, pulmonary hypertension, and mitral regurgitation.  She presented with chest pain and shortness of breath.  She was found to have an ejection fraction of 25% with severe mitral regurgitation and severe three-vessel coronary disease.  She underwent coronary bypass grafting x 4 and mitral valve repair on 09/06/2023.  Was slow to progress postoperatively.  She did have atrial fibrillation requiring amiodarone  and Eliquis .  Also had a left pleural effusion requiring thoracentesis.  Ultimately went home on postoperative day 16.  She had another thoracentesis as an outpatient on 10/09/2023.  That drained 1.4 L.  I saw her on 10/12/2023 and she was feeling better.  She still had swelling in her legs and was having some mild incisional pain.  Did have some drainage from her sternal wound but no evidence of infection.  Today she feels better.  She has less swelling in her legs with her current diuretic dose.  Exercise tolerance is improving.  Not having any significant pain.  Still has some clear yellowish drainage from her sternal incision.  Past Medical History:  Diagnosis Date   CAD (coronary artery disease) 7/12-8/12   staged LAD/RCA DES   Diabetes mellitus (HCC)    Diastolic dysfunction 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    Dyslipidemia    HTN (hypertension)     Current Outpatient Medications  Medication Sig Dispense Refill   acetaminophen  (TYLENOL ) 325 MG tablet Take 2 tablets (650 mg total) by mouth every 6 (six) hours as needed for mild pain (pain score 1-3).     amiodarone  (PACERONE ) 200 MG tablet Take 1 tablet (200 mg total) by mouth daily. 30 tablet 5   apixaban  (ELIQUIS ) 5 MG TABS tablet Take 1 tablet (5 mg total) by  mouth 2 (two) times daily. 60 tablet 5   aspirin  EC 81 MG tablet Take 1 tablet (81 mg total) by mouth daily. Swallow whole.     BD INSULIN  SYRINGE U/F 1/2UNIT 31G X 5/16 0.3 ML MISC      Beta Carotene (VITAMIN A) 25000 UNIT capsule Take 25,000 Units by mouth daily.     Biotin 5000 MCG CAPS Take 1 capsule by mouth daily.     cyanocobalamin (VITAMIN B12) 1000 MCG tablet Take 2,000 mcg by mouth daily.     dapagliflozin  propanediol (FARXIGA ) 10 MG TABS tablet Take 1 tablet (10 mg total) by mouth daily. 30 tablet 5   docusate sodium  (COLACE) 100 MG capsule Take 100 mg by mouth 2 (two) times daily.     ezetimibe  (ZETIA ) 10 MG tablet TAKE 1 TABLET BY MOUTH EVERY DAY 90 tablet 2   ferrous sulfate 324 MG TBEC Take 324 mg by mouth daily.     fish oil-omega-3 fatty acids 1000 MG capsule Take 1 g by mouth daily.     furosemide  (LASIX ) 40 MG tablet Take 1.5 tablets (60 mg total) by mouth daily. 45 tablet 3   gabapentin  (NEURONTIN ) 100 MG capsule Take 100 mg by mouth 2 (two) times daily. Take 100 mg at  morning and bedtime     insulin  NPH-regular Human (NOVOLIN 70/30) (70-30) 100 UNIT/ML injection Inject 30-60 Units into  the skin 2 (two) times daily.     loratadine  (CLARITIN ) 10 MG tablet Take 10 mg by mouth daily.     Oyster Shell (OYSTER CALCIUM ) 500 MG TABS tablet Take 500 mg of elemental calcium  by mouth 2 (two) times a week.     OZEMPIC , 0.25 OR 0.5 MG/DOSE, 2 MG/3ML SOPN Inject 0.25 mg into the skin once a week for 28 days, THEN 0.5 mg once a week for 28 days. 3 mL 5   pantoprazole  (PROTONIX ) 40 MG tablet Take 1 tablet by mouth daily. Reported on 03/24/2016     sacubitril -valsartan  (ENTRESTO ) 24-26 MG Take 1 tablet by mouth 2 (two) times daily. 60 tablet 6   sennosides-docusate sodium  (SENOKOT-S) 8.6-50 MG tablet Take 1 tablet by mouth daily.     spironolactone  (ALDACTONE ) 25 MG tablet Take 1 tablet (25 mg total) by mouth daily. 30 tablet 5   traMADol  (ULTRAM ) 50 MG tablet Take 1 tablet (50 mg total) by  mouth every 6 (six) hours as needed for severe pain (pain score 7-10). 28 tablet 0   Semaglutide ,0.25 or 0.5MG /DOS, (OZEMPIC , 0.25 OR 0.5 MG/DOSE,) 2 MG/3ML SOPN Inject 0.5 mg into the skin once a week. (Patient not taking: Reported on 10/24/2023) 3 mL 2   No current facility-administered medications for this visit.    Physical Exam BP 113/71 (BP Location: Left Arm)   Pulse (!) 117   Resp 20   Ht 5' 4 (1.626 m)   Wt 189 lb (85.7 kg)   SpO2 93% Comment: RA  BMI 32.16 kg/m  78 year old woman in no acute distress Alert and oriented x 3 with no focal deficits Lungs slightly diminished at left base but otherwise clear Cardiac regular rate and rhythm with no murmur Sternum stable, incision with hypertrophic granulation tissue.  No suture visible.  Cauterized with silver nitrate.  Diagnostic Tests: I personally reviewed her chest x-ray.  Shows continued slow improvement of the atelectasis at her left base.  Impression: Diana [name] is a 78 year old woman with a history of coronary disease, hypertension, hyperlipidemia, type 2 diabetes, obesity, heart failure, pulmonary hypertension, and mitral regurgitation.  Presented with chest pain and shortness of breath and was found to have an ejection fraction of 25% with severe mitral regurgitation.  Catheterization revealed severe three-vessel disease.  Also noted to have severe pulmonary hypertension.   Status post CABG and mitral valve repair-now almost 2 months out from surgery.  Is making good progress and wants to start cardiac rehab.  Sternal wound has been slow to heal and there is a small area of hypertrophic granulation tissue.  That was cauterized with silver nitrate.  She will continue to cover with a dry dressing.  She and her daughter know to let us  know right away if she sees any signs of infection such as redness or pus.  Heart failure-significantly improved with current regimen.  Left pleural effusion-chest x-ray today shows improving  aeration of the left base.  There is still some atelectasis there and there might be a very small amount of fluid but nothing that would require intervention.  Plan: Return in 3 weeks with PA and lateral chest x-ray  Elspeth JAYSON Millers, MD [facility] [phone]

## 2023-10-31 NOTE — Progress Notes (Signed)
 Outpatient cardiac rehab referral placed per Dr. Dorris Fetch to [facility].

## 2023-11-06 ENCOUNTER — Telehealth (HOSPITAL_COMMUNITY): Payer: Self-pay

## 2023-11-06 NOTE — Telephone Encounter (Signed)
Pt insurance is active and benefits verified through [facility]. Co-pay $25.00, DED $0.00/$0.00 met, out of pocket $6,750.00/$19.50 met, co-insurance 0%. No pre-authorization required. Passport, 11/06/23 @ 11:16AM, REF#[id number]   How many CR sessions are covered? (36 visits for TCR, 72 visits for ICR)72 Is this a lifetime maximum or an annual maximum? Annual Has the member used any of these services to date? No Is there a time limit (weeks/months) on start of program and/or program completion? No     Will contact patient to see if she is interested in the Cardiac Rehab Program.

## 2023-11-06 NOTE — Telephone Encounter (Signed)
Called patient to see if she was interested in participating in the Cardiac Rehab Program. Patient stated yes. Patient will come in for orientation on 11/16/23 @ 9:30AM and will attend the 10:15AM exercise class. Went over insurance, patient verbalized understanding.   Pensions consultant.

## 2023-11-09 ENCOUNTER — Telehealth: Payer: Self-pay

## 2023-11-09 MED ORDER — TRAMADOL HCL 50 MG PO TABS: 50.0000 mg | ORAL_TABLET | Freq: Two times a day (BID) | ORAL | 0 refills | Status: DC | PRN

## 2023-11-09 NOTE — Telephone Encounter (Signed)
Patient contacted the office requesting a refill of Tramadol 50 mg. She is s/p CABG with Dr. Dorris Fetch 11/20. She states she is taking the medication once daily, and only if she needs it. She states pain is in her "rib cage" due to surgery. Spoke with [place], [place] who prescribed a refill Tramadol 50 mg Q12hrs PRN for pain #20 with no refills. Prescription called into [facility] in [place] per patient's request. Patient aware and acknowledged receipt.

## 2023-11-15 ENCOUNTER — Telehealth (HOSPITAL_COMMUNITY): Payer: Self-pay

## 2023-11-15 NOTE — Telephone Encounter (Signed)
  Called pt to confirm appt for 11/16/23 at 0930. Gave pt instructions for appt, what to wear, office address, eating/taking meds before, and if sick to call and reschedule. Pt voiced understanding, all questions answered.   Health history completed? Yes   Jonna Coup, MS, ACSM-CEP 11/15/2023 1:35 PM

## 2023-11-16 ENCOUNTER — Encounter (HOSPITAL_COMMUNITY)
Admission: RE | Admit: 2023-11-16 | Discharge: 2023-11-16 | Disposition: A | Discharge status: Home / Self Care | Payer: Medicare HMO | Source: Ambulatory Visit | Attending: Internal Medicine | Admitting: Internal Medicine

## 2023-11-16 VITALS — BP 106/60 | HR 97 | Ht 64.0 in | Wt 186.5 lb

## 2023-11-16 DIAGNOSIS — Z9889 Other specified postprocedural states: Secondary | ICD-10-CM | POA: Diagnosis present

## 2023-11-16 DIAGNOSIS — Z951 Presence of aortocoronary bypass graft: Secondary | ICD-10-CM | POA: Insufficient documentation

## 2023-11-16 NOTE — Progress Notes (Signed)
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 Swallow whole., Disp: , Rfl:    BD INSULIN SYRINGE U/F 1/2UNIT 31G X 5/16" 0.3 ML MISC, , Disp: , Rfl:    Beta Carotene (VITAMIN A) 25000 UNIT capsule, Take 25,000 Units by mouth daily., Disp: , Rfl:    cyanocobalamin (VITAMIN B12) 1000 MCG tablet, Take 2,000 mcg by mouth daily., Disp: , Rfl:    dapagliflozin propanediol (FARXIGA) 10 MG TABS tablet, Take 1 tablet (10 mg total) by mouth daily., Disp: 30 tablet, Rfl: 5   ezetimibe (ZETIA) 10 MG tablet, TAKE 1 TABLET BY MOUTH EVERY DAY, Disp: 90 tablet, Rfl: 2   furosemide (LASIX) 40 MG tablet, Take 1.5 tablets (60 mg total) by mouth daily., Disp: 45 tablet, Rfl: 3   gabapentin (NEURONTIN) 100 MG capsule, Take 100 mg by mouth 2 (two) times daily. Take 100 mg at  morning and bedtime, Disp: , Rfl:    insulin NPH-regular Human (NOVOLIN 70/30) (70-30)  100 UNIT/ML injection, Inject 30-60 Units into the skin 2 (two) times daily., Disp: , Rfl:    loratadine (CLARITIN) 10 MG tablet, Take 10 mg by mouth daily., Disp: , Rfl:    Magnesium Hydroxide (DULCOLAX SOFT CHEWS PO), Take 600 mg by mouth daily., Disp: , Rfl:    Oyster Shell (OYSTER CALCIUM) 500 MG TABS tablet, Take 500 mg of elemental calcium by mouth 2 (two) times a week., Disp: , Rfl:    OZEMPIC, 0.25 OR 0.5 MG/DOSE, 2 MG/3ML SOPN, Inject 0.25 mg into the skin once a week for 28 days, THEN 0.5 mg once a week for 28 days., Disp: 3 mL, Rfl: 5   pantoprazole (PROTONIX) 40 MG tablet, Take 1 tablet by mouth daily. Reported on 03/24/2016, Disp: , Rfl:    sacubitril-valsartan (ENTRESTO) 24-26 MG, Take 1 tablet by mouth 2 (two) times daily., Disp: 60 tablet, Rfl: 6   spironolactone (ALDACTONE) 25 MG tablet, Take 1 tablet (25 mg total) by mouth daily., Disp: 30 tablet, Rfl: 5   traMADol (ULTRAM) 50 MG tablet, Take 1 tablet (50 mg total) by mouth every 12 (twelve) hours as needed for severe pain (pain score 7-10)., Disp: 20 tablet, Rfl: 0   Biotin 5000 MCG CAPS, Take 1 capsule by mouth daily. (Patient not taking: Reported on 11/16/2023), Disp: , Rfl:    docusate sodium (COLACE)  100 MG capsule, Take 100 mg by mouth 2 (two) times daily. (Patient not taking: Reported on 11/16/2023), Disp: , Rfl:    ferrous sulfate 324 MG TBEC, Take 324 mg by mouth daily. (Patient not taking: Reported on 11/16/2023), Disp: , Rfl:    fish oil-omega-3 fatty acids 1000 MG capsule, Take 1 g by mouth daily. (Patient not taking: Reported on 11/16/2023), Disp: , Rfl:    Semaglutide,0.25 or 0.5MG /DOS, (OZEMPIC, 0.25 OR 0.5 MG/DOSE,) 2 MG/3ML SOPN, Inject 0.5 mg into the skin once a week. (Patient not taking: Reported on 10/24/2023), Disp: 3 mL, Rfl: 2   sennosides-docusate sodium (SENOKOT-S) 8.6-50 MG tablet, Take 1 tablet by mouth daily. 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    Dyslipidemia    HTN (hypertension)     Tobacco Use: Social History   Tobacco Use  Smoking Status Never   Passive exposure: Never  Smokeless Tobacco Never    Labs: Review Flowsheet  More data exists      Latest Ref Rng & Units 09/17/2023 09/18/2023 09/19/2023 09/20/2023 09/21/2023  Labs for ITP Cardiac and Pulmonary Rehab  O2 Saturation % 54.6  59.6  78.6  54.5  56.7     Capillary Blood Glucose: Lab Results  Component Value Date   GLUCAP 121 (H) 09/21/2023   GLUCAP 136 (H) 09/21/2023   GLUCAP 120 (H) 09/20/2023   GLUCAP 240 (H) 09/20/2023   GLUCAP 205 (H) 09/20/2023     Exercise Target Goals: Exercise Program Goal: Individual exercise prescription set using results from initial 6 min walk test and THRR while considering  patient's activity barriers and safety.   Exercise Prescription Goal: Initial exercise prescription builds to 30-45 minutes a day of aerobic activity, 2-3 days per week.  Home exercise guidelines will be given to patient during program as part of exercise prescription that the participant will acknowledge.  Activity Barriers & Risk Stratification:  Activity Barriers & Cardiac Risk Stratification - 11/16/23 1132       Activity Barriers & Cardiac Risk Stratification   Activity Barriers Arthritis;Deconditioning;Shortness of Breath;Decreased Ventricular Function;Balance Concerns;Assistive Device    Cardiac Risk Stratification High   <5 METs on            6 Minute Walk:  6 Minute Walk     Row Name 11/16/23 1131         6 Minute Walk   Phase Initial     Distance 720 feet     Walk Time 6 minutes     # of Rest Breaks 0     MPH 1.36     METS 1.38     RPE 9     Perceived Dyspnea  1     VO2 Peak 4.84     Symptoms Yes (comment)     Comments a little winded- resolved with rest. Used  Rollator.     Resting HR 97 bpm     Resting BP 106/60     Resting Oxygen Saturation  94 %     Exercise Oxygen Saturation  during 6 min walk 94 %     Max Ex. HR 118 bpm     Max Ex. BP 118/70     2 Minute Post BP 110/68  Oxygen Initial Assessment:   Oxygen Re-Evaluation:   Oxygen Discharge (Final Oxygen Re-Evaluation):   Initial Exercise Prescription:  Initial Exercise Prescription - 11/16/23 1100       Date of Initial Exercise RX and Referring Provider   Date 11/16/23    Referring Provider Arvilla Meres, MD    Expected Discharge Date 02/10/24      NuStep   Level 1    SPM 60    Minutes 15    METs 1.6      Prescription Details   Frequency (times per week) 3    Duration Progress to 30 minutes of continuous aerobic without signs/symptoms of physical distress      Intensity   THRR 40-80% of Max Heartrate 58-118    Ratings of Perceived Exertion 11-13    Perceived Dyspnea 0-4      Progression   Progression Continue progressive overload as per policy without signs/symptoms or physical distress.      Resistance Training   Training Prescription Yes    Weight 2    Reps 10-15             Perform Capillary Blood Glucose checks as needed.  Exercise Prescription Changes:   Exercise Comments:   Exercise Goals and Review:   Exercise Goals     Row Name 11/16/23 1133             Exercise Goals   Increase Physical Activity Yes       Intervention Develop an individualized exercise prescription for aerobic and resistive training based on initial evaluation findings, risk stratification, comorbidities and participant's personal goals.;Provide advice, education, support and counseling about physical activity/exercise needs.       Expected Outcomes Short Term: Attend rehab on a regular basis to increase amount of physical activity.;Long Term: Exercising regularly at least 3-5 days a week.;Long Term: Add in home exercise to make exercise part of  routine and to increase amount of physical activity.       Increase Strength and Stamina Yes       Intervention Provide advice, education, support and counseling about physical activity/exercise needs.;Develop an individualized exercise prescription for aerobic and resistive training based on initial evaluation findings, risk stratification, comorbidities and participant's personal goals.       Expected Outcomes Short Term: Increase workloads from initial exercise prescription for resistance, speed, and METs.;Short Term: Perform resistance training exercises routinely during rehab and add in resistance training at home;Long Term: Improve cardiorespiratory fitness, muscular endurance and strength as measured by increased METs and functional capacity ( )       Able to understand and use rate of perceived exertion (RPE) scale Yes       Intervention Provide education and explanation on how to use RPE scale       Expected Outcomes Short Term: Able to use RPE daily in rehab to express subjective intensity level;Long Term:  Able to use RPE to guide intensity level when exercising independently       Knowledge and understanding of Target Heart Rate Range (THRR) Yes       Intervention Provide education and explanation of THRR including how the numbers were predicted and where they are located for reference       Expected Outcomes Short Term: Able to state/look up THRR;Short Term: Able to use daily as guideline for intensity in rehab;Long Term: Able to use THRR to govern intensity when exercising independently       Understanding of Exercise Prescription Yes  Intervention Provide education, explanation, and written materials on patient's individual exercise prescription       Expected Outcomes Short Term: Able to explain program exercise prescription;Long Term: Able to explain home exercise prescription to exercise independently                Exercise Goals Re-Evaluation :   Discharge Exercise  Prescription (Final Exercise Prescription Changes):   Nutrition:  Target Goals: Understanding of nutrition guidelines, daily intake of sodium 1500mg , cholesterol 200mg , calories 30% from fat and 7% or less from saturated fats, daily to have 5 or more servings of fruits and vegetables.  Biometrics:  Pre Biometrics - 11/16/23 1130       Pre Biometrics   Waist Circumference 46 inches    Hip Circumference 46 inches    Waist to Hip Ratio 1 %    Triceps Skinfold 37 mm    % Body Fat 47.3 %    Grip Strength 20 kg    Flexibility 0 in   not done, low back pain   Single Leg Stand 1.87 seconds              Nutrition Therapy Plan and Nutrition Goals:   Nutrition Assessments:  MEDIFICTS Score Key: >=70 Need to make dietary changes  40-70 Heart Healthy Diet <= 40 Therapeutic Level Cholesterol Diet    Picture Your Plate Scores: <96 Unhealthy dietary pattern with much room for improvement. 41-50 Dietary pattern unlikely to meet recommendations for good health and room for improvement. 51-60 More healthful dietary pattern, with some room for improvement.  >60 Healthy dietary pattern, although there may be some specific behaviors that could be improved.    Nutrition Goals Re-Evaluation:   Nutrition Goals Re-Evaluation:   Nutrition Goals Discharge (Final Nutrition Goals Re-Evaluation):   Psychosocial: Target Goals: Acknowledge presence or absence of significant depression and/or stress, maximize coping skills, provide positive support system. Participant is able to verbalize types and ability to use techniques and skills needed for reducing stress and depression.  Initial Review & Psychosocial Screening:  Initial Psych Review & Screening - 11/16/23 1134       Initial Review   Current issues with None Identified      Family Dynamics   Good Support System? Yes   Saadia has her husband and children for support   Comments Shyane denies any depression/ anxiety/ stress. She  is still very fatigued with ADL's and shared that she is frustrated with how slow progress has been. She is ready to return to her baseline.      Barriers   Psychosocial barriers to participate in program There are no identifiable barriers or psychosocial needs.      Screening Interventions   Interventions Encouraged to exercise;Provide feedback about the scores to participant    Expected Outcomes Short Term goal: Identification and review with participant of any Quality of Life or Depression concerns found by scoring the questionnaire.;Long Term goal: The participant improves quality of Life and PHQ9 Scores as seen by post scores and/or verbalization of changes             Quality of Life Scores:  Quality of Life - 11/16/23 1137       Quality of Life   Select Quality of Life      Quality of Life Scores   Health/Function Pre 19 %    Socioeconomic Pre 29 %    Psych/Spiritual Pre 28.29 %    Family Pre 28.8 %  GLOBAL Pre 24.8 %            Scores of 19 and below usually indicate a poorer quality of life in these areas.  A difference of  2-3 points is a clinically meaningful difference.  A difference of 2-3 points in the total score of the Quality of Life Index has been associated with significant improvement in overall quality of life, self-image, physical symptoms, and general health in studies assessing change in quality of life.  PHQ-9: Review Flowsheet       11/16/2023 03/24/2016  Depression screen PHQ 2/9  Decreased Interest 0 0  Down, Depressed, Hopeless 0 0  PHQ - 2 Score 0 0  Altered sleeping 0 -  Tired, decreased energy 0 -  Change in appetite 0 -  Feeling bad or failure about yourself  0 -  Trouble concentrating 0 -  Moving slowly or fidgety/restless 0 -  Suicidal thoughts 0 -  PHQ-9 Score 0 -  Difficult doing work/chores Not difficult at all -   Interpretation of Total Score  Total Score Depression Severity:  1-4 = Minimal depression, 5-9 = Mild  depression, 10-14 = Moderate depression, 15-19 = Moderately severe depression, 20-27 = Severe depression   Psychosocial Evaluation and Intervention:   Psychosocial Re-Evaluation:   Psychosocial Discharge (Final Psychosocial Re-Evaluation):   Vocational Rehabilitation: Provide vocational rehab assistance to qualifying candidates.   Vocational Rehab Evaluation & Intervention:  Vocational Rehab - 11/16/23 1138       Initial Vocational Rehab Evaluation & Intervention   Assessment shows need for Vocational Rehabilitation No   Shifa is working            Education: Education Goals: Education classes will be provided on a weekly basis, covering required topics. Participant will state understanding/return demonstration of topics presented.     Core Videos: Exercise    Move It!  Clinical staff conducted group or individual video education with verbal and written material and guidebook.  Patient learns the recommended Pritikin exercise program. Exercise with the goal of living a long, healthy life. Some of the health benefits of exercise include controlled diabetes, healthier blood pressure levels, improved cholesterol levels, improved heart and lung capacity, improved sleep, and better body composition. Everyone should speak with their doctor before starting or changing an exercise routine.  Biomechanical Limitations Clinical staff conducted group or individual video education with verbal and written material and guidebook.  Patient learns how biomechanical limitations can impact exercise and how we can mitigate and possibly overcome limitations to have an impactful and balanced exercise routine.  Body Composition Clinical staff conducted group or individual video education with verbal and written material and guidebook.  Patient learns that body composition (ratio of muscle mass to fat mass) is a key component to assessing overall fitness, rather than body weight alone. Increased  fat mass, especially visceral belly fat, can put [place] at increased risk for metabolic syndrome, type 2 diabetes, heart disease, and even death. It is recommended to combine diet and exercise (cardiovascular and resistance training) to improve your body composition. Seek guidance from your physician and exercise physiologist before implementing an exercise routine.  Exercise Action Plan Clinical staff conducted group or individual video education with verbal and written material and guidebook.  Patient learns the recommended strategies to achieve and enjoy long-term exercise adherence, including variety, self-motivation, self-efficacy, and positive decision making. Benefits of exercise include fitness, good health, weight management, more energy, better sleep, less stress, and overall well-being.  Medical   Heart Disease Risk Reduction Clinical staff conducted group or individual video education with verbal and written material and guidebook.  Patient learns our heart is our most vital organ as it circulates oxygen, nutrients, white blood cells, and hormones throughout the entire body, and carries waste away. Data supports a plant-based eating plan like the Pritikin Program for its effectiveness in slowing progression of and reversing heart disease. The video provides a number of recommendations to address heart disease.   Metabolic Syndrome and Belly Fat  Clinical staff conducted group or individual video education with verbal and written material and guidebook.  Patient learns what metabolic syndrome is, how it leads to heart disease, and how one can reverse it and keep it from coming back. You have metabolic syndrome if you have 3 of the following 5 criteria: abdominal obesity, high blood pressure, high triglycerides, low HDL cholesterol, and high blood sugar.  Hypertension and Heart Disease Clinical staff conducted group or individual video education with verbal and written material and guidebook.   Patient learns that high blood pressure, or hypertension, is very common in the [place]. Hypertension is largely due to excessive salt intake, but other important risk factors include being overweight, physical inactivity, drinking too much alcohol, smoking, and not eating enough potassium from fruits and vegetables. High blood pressure is a leading risk factor for heart attack, stroke, congestive heart failure, dementia, kidney failure, and premature death. Long-term effects of excessive salt intake include stiffening of the arteries and thickening of heart muscle and organ damage. Recommendations include ways to reduce hypertension and the risk of heart disease.  Diseases of Our Time - Focusing on Diabetes Clinical staff conducted group or individual video education with verbal and written material and guidebook.  Patient learns why the best way to stop diseases of our time is prevention, through food and other lifestyle changes. Medicine (such as prescription pills and surgeries) is often only a Band-Aid on the problem, not a long-term solution. Most common diseases of our time include obesity, type 2 diabetes, hypertension, heart disease, and cancer. The Pritikin Program is recommended and has been proven to help reduce, reverse, and/or prevent the damaging effects of metabolic syndrome.  Nutrition   Overview of the Pritikin Eating Plan  Clinical staff conducted group or individual video education with verbal and written material and guidebook.  Patient learns about the Pritikin Eating Plan for disease risk reduction. The Pritikin Eating Plan emphasizes a wide variety of unrefined, minimally-processed carbohydrates, like fruits, vegetables, whole grains, and legumes. Go, Caution, and Stop food choices are explained. Plant-based and lean animal proteins are emphasized. Rationale provided for low sodium intake for blood pressure control, low added sugars for blood sugar stabilization, and low  added fats and oils for coronary artery disease risk reduction and weight management.  Calorie Density  Clinical staff conducted group or individual video education with verbal and written material and guidebook.  Patient learns about calorie density and how it impacts the Pritikin Eating Plan. Knowing the characteristics of the food you choose will help you decide whether those foods will lead to weight gain or weight loss, and whether you want to consume more or less of them. Weight loss is usually a side effect of the Pritikin Eating Plan because of its focus on low calorie-dense foods.  Label Reading  Clinical staff conducted group or individual video education with verbal and written material and guidebook.  Patient learns about the Pritikin recommended label reading guidelines  and corresponding recommendations regarding calorie density, added sugars, sodium content, and whole grains.  Dining Out - Part 1  Clinical staff conducted group or individual video education with verbal and written material and guidebook.  Patient learns that restaurant meals can be sabotaging because they can be so high in calories, fat, sodium, and/or sugar. Patient learns recommended strategies on how to positively address this and avoid unhealthy pitfalls.  Facts on Fats  Clinical staff conducted group or individual video education with verbal and written material and guidebook.  Patient learns that lifestyle modifications can be just as effective, if not more so, as many medications for lowering your risk of heart disease. A Pritikin lifestyle can help to reduce your risk of inflammation and atherosclerosis (cholesterol build-up, or plaque, in the artery walls). Lifestyle interventions such as dietary choices and physical activity address the cause of atherosclerosis. A review of the types of fats and their impact on blood cholesterol levels, along with dietary recommendations to reduce fat intake is also  included.  Nutrition Action Plan  Clinical staff conducted group or individual video education with verbal and written material and guidebook.  Patient learns how to incorporate Pritikin recommendations into their lifestyle. Recommendations include planning and keeping personal health goals in mind as an important part of their success.  Healthy Mind-Set    Healthy Minds, Bodies, Hearts  Clinical staff conducted group or individual video education with verbal and written material and guidebook.  Patient learns how to identify when they are stressed. Video will discuss the impact of that stress, as well as the many benefits of stress management. Patient will also be introduced to stress management techniques. The way we think, act, and feel has an impact on our hearts.  How Our Thoughts Can Heal Our Hearts  Clinical staff conducted group or individual video education with verbal and written material and guidebook.  Patient learns that negative thoughts can cause depression and anxiety. This can result in negative lifestyle behavior and serious health problems. Cognitive behavioral therapy is an effective method to help control our thoughts in order to change and improve our emotional outlook.  Additional Videos:  Exercise    Improving Performance  Clinical staff conducted group or individual video education with verbal and written material and guidebook.  Patient learns to use a non-linear approach by alternating intensity levels and lengths of time spent exercising to help burn more calories and lose more body fat. Cardiovascular exercise helps improve heart health, metabolism, hormonal balance, blood sugar control, and recovery from fatigue. Resistance training improves strength, endurance, balance, coordination, reaction time, metabolism, and muscle mass. Flexibility exercise improves circulation, posture, and balance. Seek guidance from your physician and exercise physiologist before  implementing an exercise routine and learn your capabilities and proper form for all exercise.  Introduction to Yoga  Clinical staff conducted group or individual video education with verbal and written material and guidebook.  Patient learns about yoga, a discipline of the coming together of mind, breath, and body. The benefits of yoga include improved flexibility, improved range of motion, better posture and core strength, increased lung function, weight loss, and positive self-image. Yoga's heart health benefits include lowered blood pressure, healthier heart rate, decreased cholesterol and triglyceride levels, improved immune function, and reduced stress. Seek guidance from your physician and exercise physiologist before implementing an exercise routine and learn your capabilities and proper form for all exercise.  Medical   Aging: Enhancing Your Quality of Life  Clinical staff conducted group  or individual video education with verbal and written material and guidebook.  Patient learns key strategies and recommendations to stay in good physical health and enhance quality of life, such as prevention strategies, having an advocate, securing a Health Care Proxy and Power of Attorney, and keeping a list of medications and system for tracking them. It also discusses how to avoid risk for bone loss.  Biology of Weight Control  Clinical staff conducted group or individual video education with verbal and written material and guidebook.  Patient learns that weight gain occurs because we consume more calories than we burn (eating more, moving less). Even if your body weight is normal, you may have higher ratios of fat compared to muscle mass. Too much body fat puts you at increased risk for cardiovascular disease, heart attack, stroke, type 2 diabetes, and obesity-related cancers. In addition to exercise, following the Pritikin Eating Plan can help reduce your risk.  Decoding Lab Results  Clinical staff  conducted group or individual video education with verbal and written material and guidebook.  Patient learns that lab test reflects one measurement whose values change over time and are influenced by many factors, including medication, stress, sleep, exercise, food, hydration, pre-existing medical conditions, and more. It is recommended to use the knowledge from this video to become more involved with your lab results and evaluate your numbers to speak with your doctor.   Diseases of Our Time - Overview  Clinical staff conducted group or individual video education with verbal and written material and guidebook.  Patient learns that according to the CDC, 50% to 70% of chronic diseases (such as obesity, type 2 diabetes, elevated lipids, hypertension, and heart disease) are avoidable through lifestyle improvements including healthier food choices, listening to satiety cues, and increased physical activity.  Sleep Disorders Clinical staff conducted group or individual video education with verbal and written material and guidebook.  Patient learns how good quality and duration of sleep are important to overall health and well-being. Patient also learns about sleep disorders and how they impact health along with recommendations to address them, including discussing with a physician.  Nutrition  Dining Out - Part 2 Clinical staff conducted group or individual video education with verbal and written material and guidebook.  Patient learns how to plan ahead and communicate in order to maximize their dining experience in a healthy and nutritious manner. Included are recommended food choices based on the type of restaurant the patient is visiting.   Fueling a Banker conducted group or individual video education with verbal and written material and guidebook.  There is a strong connection between our food choices and our health. Diseases like obesity and type 2 diabetes are very  prevalent and are in large-part due to lifestyle choices. The Pritikin Eating Plan provides plenty of food and hunger-curbing satisfaction. It is easy to follow, affordable, and helps reduce health risks.  Menu Workshop  Clinical staff conducted group or individual video education with verbal and written material and guidebook.  Patient learns that restaurant meals can sabotage health goals because they are often packed with calories, fat, sodium, and sugar. Recommendations include strategies to plan ahead and to communicate with the manager, chef, or server to help order a healthier meal.  Planning Your Eating Strategy  Clinical staff conducted group or individual video education with verbal and written material and guidebook.  Patient learns about the Pritikin Eating Plan and its benefit of reducing the risk of disease. The Pritikin  Eating Plan does not focus on calories. Instead, it emphasizes high-quality, nutrient-rich foods. By knowing the characteristics of the foods, we choose, we can determine their calorie density and make informed decisions.  Targeting Your Nutrition Priorities  Clinical staff conducted group or individual video education with verbal and written material and guidebook.  Patient learns that lifestyle habits have a tremendous impact on disease risk and progression. This video provides eating and physical activity recommendations based on your personal health goals, such as reducing LDL cholesterol, losing weight, preventing or controlling type 2 diabetes, and reducing high blood pressure.  Vitamins and Minerals  Clinical staff conducted group or individual video education with verbal and written material and guidebook.  Patient learns different ways to obtain key vitamins and minerals, including through a recommended healthy diet. It is important to discuss all supplements you take with your doctor.   Healthy Mind-Set    Smoking Cessation  Clinical staff conducted group  or individual video education with verbal and written material and guidebook.  Patient learns that cigarette smoking and tobacco addiction pose a serious health risk which affects millions of people. Stopping smoking will significantly reduce the risk of heart disease, lung disease, and many forms of cancer. Recommended strategies for quitting are covered, including working with your doctor to develop a successful plan.  Culinary   Becoming a Set designer conducted group or individual video education with verbal and written material and guidebook.  Patient learns that cooking at home can be healthy, cost-effective, quick, and puts them in control. Keys to cooking healthy recipes will include looking at your recipe, assessing your equipment needs, planning ahead, making it simple, choosing cost-effective seasonal ingredients, and limiting the use of added fats, salts, and sugars.  Cooking - Breakfast and Snacks  Clinical staff conducted group or individual video education with verbal and written material and guidebook.  Patient learns how important breakfast is to satiety and nutrition through the entire day. Recommendations include key foods to eat during breakfast to help stabilize blood sugar levels and to prevent overeating at meals later in the day. Planning ahead is also a key component.  Cooking - Educational psychologist conducted group or individual video education with verbal and written material and guidebook.  Patient learns eating strategies to improve overall health, including an approach to cook more at home. Recommendations include thinking of animal protein as a side on your plate rather than center stage and focusing instead on lower calorie dense options like vegetables, fruits, whole grains, and plant-based proteins, such as beans. Making sauces in large quantities to freeze for later and leaving the skin on your vegetables are also recommended to maximize  your experience.  Cooking - Healthy Salads and Dressing Clinical staff conducted group or individual video education with verbal and written material and guidebook.  Patient learns that vegetables, fruits, whole grains, and legumes are the foundations of the Pritikin Eating Plan. Recommendations include how to incorporate each of these in flavorful and healthy salads, and how to create homemade salad dressings. Proper handling of ingredients is also covered. Cooking - Soups and [employer] - Soups and Desserts Clinical staff conducted group or individual video education with verbal and written material and guidebook.  Patient learns that Pritikin soups and desserts make for easy, nutritious, and delicious snacks and meal components that are low in sodium, fat, sugar, and calorie density, while high in vitamins, minerals, and filling fiber. Recommendations include simple  and healthy ideas for soups and desserts.   Overview     The Pritikin Solution Program Overview Clinical staff conducted group or individual video education with verbal and written material and guidebook.  Patient learns that the results of the Pritikin Program have been documented in more than 100 articles published in peer-reviewed journals, and the benefits include reducing risk factors for (and, in some cases, even reversing) high cholesterol, high blood pressure, type 2 diabetes, obesity, and more! An overview of the three key pillars of the Pritikin Program will be covered: eating well, doing regular exercise, and having a healthy mind-set.  WORKSHOPS  Exercise: Exercise Basics: Building Your Action Plan Clinical staff led group instruction and group discussion with PowerPoint presentation and patient guidebook. To enhance the learning environment the use of posters, models and videos may be added. At the conclusion of this workshop, patients will comprehend the difference between physical activity and exercise, as well  as the benefits of incorporating both, into their routine. Patients will understand the FITT (Frequency, Intensity, Time, and Type) principle and how to use it to build an exercise action plan. In addition, safety concerns and other considerations for exercise and cardiac rehab will be addressed by the presenter. The purpose of this lesson is to promote a comprehensive and effective weekly exercise routine in order to improve patients' overall level of fitness.   Managing Heart Disease: Your Path to a Healthier Heart Clinical staff led group instruction and group discussion with PowerPoint presentation and patient guidebook. To enhance the learning environment the use of posters, models and videos may be added.At the conclusion of this workshop, patients will understand the anatomy and physiology of the heart. Additionally, they will understand how Pritikin's three pillars impact the risk factors, the progression, and the management of heart disease.  The purpose of this lesson is to provide a high-level overview of the heart, heart disease, and how the Pritikin lifestyle positively impacts risk factors.  Exercise Biomechanics Clinical staff led group instruction and group discussion with PowerPoint presentation and patient guidebook. To enhance the learning environment the use of posters, models and videos may be added. Patients will learn how the structural parts of their bodies function and how these functions impact their daily activities, movement, and exercise. Patients will learn how to promote a neutral spine, learn how to manage pain, and identify ways to improve their physical movement in order to promote healthy living. The purpose of this lesson is to expose patients to common physical limitations that impact physical activity. Participants will learn practical ways to adapt and manage aches and pains, and to minimize their effect on regular exercise. Patients will learn how to  maintain good posture while sitting, walking, and lifting.  Balance Training and Fall Prevention  Clinical staff led group instruction and group discussion with PowerPoint presentation and patient guidebook. To enhance the learning environment the use of posters, models and videos may be added. At the conclusion of this workshop, patients will understand the importance of their sensorimotor skills (vision, proprioception, and the vestibular system) in maintaining their ability to balance as they age. Patients will apply a variety of balancing exercises that are appropriate for their current level of function. Patients will understand the common causes for poor balance, possible solutions to these problems, and ways to modify their physical environment in order to minimize their fall risk. The purpose of this lesson is to teach patients about the importance of maintaining balance as they age and  ways to minimize their risk of falling.  WORKSHOPS   Nutrition:  Fueling a Ship broker led group instruction and group discussion with PowerPoint presentation and patient guidebook. To enhance the learning environment the use of posters, models and videos may be added. Patients will review the foundational principles of the Pritikin Eating Plan and understand what constitutes a serving size in each of the food groups. Patients will also learn Pritikin-friendly foods that are better choices when away from home and review make-ahead meal and snack options. Calorie density will be reviewed and applied to three nutrition priorities: weight maintenance, weight loss, and weight gain. The purpose of this lesson is to reinforce (in a group setting) the key concepts around what patients are recommended to eat and how to apply these guidelines when away from home by planning and selecting Pritikin-friendly options. Patients will understand how calorie density may be adjusted for different weight management  goals.  Mindful Eating  Clinical staff led group instruction and group discussion with PowerPoint presentation and patient guidebook. To enhance the learning environment the use of posters, models and videos may be added. Patients will briefly review the concepts of the Pritikin Eating Plan and the importance of low-calorie dense foods. The concept of mindful eating will be introduced as well as the importance of paying attention to internal hunger signals. Triggers for non-hunger eating and techniques for dealing with triggers will be explored. The purpose of this lesson is to provide patients with the opportunity to review the basic principles of the Pritikin Eating Plan, discuss the value of eating mindfully and how to measure internal cues of hunger and fullness using the Hunger Scale. Patients will also discuss reasons for non-hunger eating and learn strategies to use for controlling emotional eating.  Targeting Your Nutrition Priorities Clinical staff led group instruction and group discussion with PowerPoint presentation and patient guidebook. To enhance the learning environment the use of posters, models and videos may be added. Patients will learn how to determine their genetic susceptibility to disease by reviewing their family history. Patients will gain insight into the importance of diet as part of an overall healthy lifestyle in mitigating the impact of genetics and other environmental insults. The purpose of this lesson is to provide patients with the opportunity to assess their personal nutrition priorities by looking at their family history, their own health history and current risk factors. Patients will also be able to discuss ways of prioritizing and modifying the Pritikin Eating Plan for their highest risk areas  Menu  Clinical staff led group instruction and group discussion with PowerPoint presentation and patient guidebook. To enhance the learning environment the use of posters,  models and videos may be added. Using menus brought in from [employer], or printed from [employer], patients will apply the Pritikin dining out guidelines that were presented in the [employer] video. Patients will also be able to practice these guidelines in a variety of provided scenarios. The purpose of this lesson is to provide patients with the opportunity to practice hands-on learning of the Pritikin Dining Out guidelines with actual menus and practice scenarios.  Label Reading Clinical staff led group instruction and group discussion with PowerPoint presentation and patient guidebook. To enhance the learning environment the use of posters, models and videos may be added. Patients will review and discuss the Pritikin label reading guidelines presented in Pritikin's Label Reading Educational series video. Using fool labels brought in from local grocery stores and  markets, patients will apply the label reading guidelines and determine if the packaged food meet the Pritikin guidelines. The purpose of this lesson is to provide patients with the opportunity to review, discuss, and practice hands-on learning of the Pritikin Label Reading guidelines with actual packaged food labels. Cooking School  Pritikin's [employer] are designed to teach patients ways to prepare quick, simple, and affordable recipes at home. The importance of nutrition's role in chronic disease risk reduction is reflected in its emphasis in the overall Pritikin program. By learning how to prepare essential core Pritikin Eating Plan recipes, patients will increase control over what they eat; be able to customize the flavor of foods without the use of added salt, sugar, or fat; and improve the quality of the food they consume. By learning a set of core recipes which are easily assembled, quickly prepared, and affordable, patients are more likely to prepare more healthy foods at home. These workshops  focus on convenient breakfasts, simple entres, side dishes, and desserts which can be prepared with minimal effort and are consistent with nutrition recommendations for cardiovascular risk reduction. Cooking [employer] are taught by a Armed forces logistics/support/administrative officer (RD) who has been trained by the [employer]. The chef or RD has a clear understanding of the importance of minimizing - if not completely eliminating - added fat, sugar, and sodium in recipes. Throughout the series of Cooking School Workshop sessions, patients will learn about healthy ingredients and efficient methods of cooking to build confidence in their capability to prepare    Cooking School weekly topics:  Adding Flavor- Sodium-Free  Fast and Healthy Breakfasts  Powerhouse Plant-Based Proteins  Satisfying Salads and Dressings  Simple Sides and Sauces  International Cuisine-Spotlight on the [employer] Zones  Delicious Desserts  Savory Soups  [employer] - Meals in a Astronomer Appetizers and Snacks  Comforting Weekend Breakfasts  One-Pot Wonders   Fast Evening Meals  Landscape architect Your Pritikin Plate  WORKSHOPS   Healthy Mindset (Psychosocial):  Focused Goals, Sustainable Changes Clinical staff led group instruction and group discussion with PowerPoint presentation and patient guidebook. To enhance the learning environment the use of posters, models and videos may be added. Patients will be able to apply effective goal setting strategies to establish at least one personal goal, and then take consistent, meaningful action toward that goal. They will learn to identify common barriers to achieving personal goals and develop strategies to overcome them. Patients will also gain an understanding of how our mind-set can impact our ability to achieve goals and the importance of cultivating a positive and growth-oriented mind-set. The purpose of this lesson is to provide patients with a deeper  understanding of how to set and achieve personal goals, as well as the tools and strategies needed to overcome common obstacles which may arise along the way.  From Head to Heart: The Power of a Healthy Outlook  Clinical staff led group instruction and group discussion with PowerPoint presentation and patient guidebook. To enhance the learning environment the use of posters, models and videos may be added. Patients will be able to recognize and describe the impact of emotions and mood on physical health. They will discover the importance of self-care and explore self-care practices which may work for them. Patients will also learn how to utilize the 4 C's to cultivate a healthier outlook and better manage stress and challenges. The purpose of this lesson is to demonstrate to patients how a  healthy outlook is an essential part of maintaining good health, especially as they continue their cardiac rehab journey.  Healthy Sleep for a Healthy Heart Clinical staff led group instruction and group discussion with PowerPoint presentation and patient guidebook. To enhance the learning environment the use of posters, models and videos may be added. At the conclusion of this workshop, patients will be able to demonstrate knowledge of the importance of sleep to overall health, well-being, and quality of life. They will understand the symptoms of, and treatments for, common sleep disorders. Patients will also be able to identify daytime and nighttime behaviors which impact sleep, and they will be able to apply these tools to help manage sleep-related challenges. The purpose of this lesson is to provide patients with a general overview of sleep and outline the importance of quality sleep. Patients will learn about a few of the most common sleep disorders. Patients will also be introduced to the concept of "sleep hygiene," and discover ways to self-manage certain sleeping problems through simple daily behavior changes.  Finally, the workshop will motivate patients by clarifying the links between quality sleep and their goals of heart-healthy living.   Recognizing and Reducing Stress Clinical staff led group instruction and group discussion with PowerPoint presentation and patient guidebook. To enhance the learning environment the use of posters, models and videos may be added. At the conclusion of this workshop, patients will be able to understand the types of stress reactions, differentiate between acute and chronic stress, and recognize the impact that chronic stress has on their health. They will also be able to apply different coping mechanisms, such as reframing negative self-talk. Patients will have the opportunity to practice a variety of stress management techniques, such as deep abdominal breathing, progressive muscle relaxation, and/or guided imagery.  The purpose of this lesson is to educate patients on the role of stress in their lives and to provide healthy techniques for coping with it.  Learning Barriers/Preferences:  Learning Barriers/Preferences - 11/16/23 1137       Learning Barriers/Preferences   Learning Barriers Sight   wears glasses   Learning Preferences Audio;Computer/Internet;Group Instruction;Individual Instruction;Pictoral;Skilled Demonstration;Video;Verbal Instruction;Written Material             Education Topics:  Knowledge Questionnaire Score:  Knowledge Questionnaire Score - 11/16/23 1138       Knowledge Questionnaire Score   Pre Score 18/24             Core Components/Risk Factors/Patient Goals at Admission:  Personal Goals and Risk Factors at Admission - 11/16/23 1138       Core Components/Risk Factors/Patient Goals on Admission    Weight Management Yes;Obesity    Intervention Weight Management: Provide education and appropriate resources to help participant work on and attain dietary goals.;Weight Management/Obesity: Establish reasonable short term and long  term weight goals.;Obesity: Provide education and appropriate resources to help participant work on and attain dietary goals.;Weight Management: Develop a combined nutrition and exercise program designed to reach desired caloric intake, while maintaining appropriate intake of nutrient and fiber, sodium and fats, and appropriate energy expenditure required for the weight goal.    Expected Outcomes Short Term: Continue to assess and modify interventions until short term weight is achieved;Long Term: Adherence to nutrition and physical activity/exercise program aimed toward attainment of established weight goal;Understanding recommendations for meals to include 15-35% energy as protein, 25-35% energy from fat, 35-60% energy from carbohydrates, less than 200mg  of dietary cholesterol, 20-35 gm of total fiber daily;Understanding of distribution of  calorie intake throughout the day with the consumption of 4-5 meals/snacks    Diabetes Yes    Intervention Provide education about signs/symptoms and action to take for hypo/hyperglycemia.;Provide education about proper nutrition, including hydration, and aerobic/resistive exercise prescription along with prescribed medications to achieve blood glucose in normal ranges: Fasting glucose 65-99 mg/dL    Expected Outcomes Short Term: Participant verbalizes understanding of the signs/symptoms and immediate care of hyper/hypoglycemia, proper foot care and importance of medication, aerobic/resistive exercise and nutrition plan for blood glucose control.;Long Term: Attainment of HbA1C < 7%.    Heart Failure Yes    Intervention Provide a combined exercise and nutrition program that is supplemented with education, support and counseling about heart failure. Directed toward relieving symptoms such as shortness of breath, decreased exercise tolerance, and extremity edema.    Expected Outcomes Improve functional capacity of life;Short term: Attendance in program 2-3 days a week with  increased exercise capacity. Reported lower sodium intake. Reported increased fruit and vegetable intake. Reports medication compliance.;Short term: Daily weights obtained and reported for increase. Utilizing diuretic protocols set by physician.;Long term: Adoption of self-care skills and reduction of barriers for early signs and symptoms recognition and intervention leading to self-care maintenance.    Hypertension Yes    Intervention Provide education on lifestyle modifcations including regular physical activity/exercise, weight management, moderate sodium restriction and increased consumption of fresh fruit, vegetables, and low fat dairy, alcohol moderation, and smoking cessation.;Monitor prescription use compliance.    Expected Outcomes Short Term: Continued assessment and intervention until BP is < 140/47mm HG in hypertensive participants. < 130/71mm HG in hypertensive participants with diabetes, heart failure or chronic kidney disease.;Long Term: Maintenance of blood pressure at goal levels.    Lipids Yes    Intervention Provide education and support for participant on nutrition & aerobic/resistive exercise along with prescribed medications to achieve LDL 70mg , HDL >40mg .    Expected Outcomes Short Term: Participant states understanding of desired cholesterol values and is compliant with medications prescribed. Participant is following exercise prescription and nutrition guidelines.;Long Term: Cholesterol controlled with medications as prescribed, with individualized exercise RX and with personalized nutrition plan. Value goals: LDL < 70mg , HDL > 40 mg.             Core Components/Risk Factors/Patient Goals Review:    Core Components/Risk Factors/Patient Goals at Discharge (Final Review):    ITP Comments:  ITP Comments     Row Name 11/16/23 0909           ITP Comments Dr. Armanda Magic medical director. Introduction to [facility] education/intensive cardiac rehab. Initial orientation  packet reviewed with patient.                Comments: Participant attended orientation for the cardiac rehabilitation program on  11/16/2023  to perform initial intake and exercise walk test. Patient introduced to the [facility] education and orientation packet was reviewed. Completed 6-minute walk test, measurements, initial ITP, and exercise prescription. Vital signs stable. Telemetry-normal sinus rhythm PVCs, sinus tach, asymptomatic.   Service time was from 0921 to 1102.     Jonna Coup, MS, ACSM-CEP 11/16/2023 11:40 AM

## 2023-11-16 NOTE — Progress Notes (Signed)
Cardiac Rehab Medication Review   Does the patient  feel that his/her medications are working for him/her?  YES   Has the patient been experiencing any side effects to the medications prescribed?  NO  Does the patient measure his/her own blood pressure or blood glucose at home? YES   Does the patient have any problems obtaining medications due to transportation or finances?   NO  Understanding of regimen: excellent Understanding of indications: excellent Potential of compliance: excellent    Comments: Arthea understands her medications and regime well. She checks her BP and CBGs at home daily and keeps a log,.    Jonna Coup, MS, ACSM-CEP 11/16/2023 10:14 AM

## 2023-11-21 ENCOUNTER — Ambulatory Visit (HOSPITAL_COMMUNITY)
Admission: RE | Admit: 2023-11-21 | Discharge: 2023-11-21 | Disposition: A | Discharge status: Home / Self Care | Payer: Medicare HMO | Source: Ambulatory Visit | Attending: Family Medicine | Admitting: Family Medicine

## 2023-11-21 ENCOUNTER — Encounter (HOSPITAL_COMMUNITY): Payer: Self-pay

## 2023-11-21 VITALS — BP 132/78 | HR 99 | Ht 64.0 in | Wt 184.4 lb

## 2023-11-21 DIAGNOSIS — Z952 Presence of prosthetic heart valve: Secondary | ICD-10-CM | POA: Insufficient documentation

## 2023-11-21 DIAGNOSIS — Z79899 Other long term (current) drug therapy: Secondary | ICD-10-CM | POA: Insufficient documentation

## 2023-11-21 DIAGNOSIS — I2510 Atherosclerotic heart disease of native coronary artery without angina pectoris: Secondary | ICD-10-CM | POA: Diagnosis not present

## 2023-11-21 DIAGNOSIS — Z7985 Long-term (current) use of injectable non-insulin antidiabetic drugs: Secondary | ICD-10-CM | POA: Diagnosis not present

## 2023-11-21 DIAGNOSIS — Z7901 Long term (current) use of anticoagulants: Secondary | ICD-10-CM | POA: Diagnosis not present

## 2023-11-21 DIAGNOSIS — Z951 Presence of aortocoronary bypass graft: Secondary | ICD-10-CM | POA: Insufficient documentation

## 2023-11-21 DIAGNOSIS — Z955 Presence of coronary angioplasty implant and graft: Secondary | ICD-10-CM | POA: Insufficient documentation

## 2023-11-21 DIAGNOSIS — Z7984 Long term (current) use of oral hypoglycemic drugs: Secondary | ICD-10-CM | POA: Diagnosis not present

## 2023-11-21 DIAGNOSIS — I110 Hypertensive heart disease with heart failure: Secondary | ICD-10-CM | POA: Insufficient documentation

## 2023-11-21 DIAGNOSIS — Z7982 Long term (current) use of aspirin: Secondary | ICD-10-CM | POA: Insufficient documentation

## 2023-11-21 DIAGNOSIS — Z794 Long term (current) use of insulin: Secondary | ICD-10-CM | POA: Diagnosis not present

## 2023-11-21 DIAGNOSIS — Z9889 Other specified postprocedural states: Secondary | ICD-10-CM | POA: Diagnosis not present

## 2023-11-21 DIAGNOSIS — I255 Ischemic cardiomyopathy: Secondary | ICD-10-CM | POA: Insufficient documentation

## 2023-11-21 DIAGNOSIS — I480 Paroxysmal atrial fibrillation: Secondary | ICD-10-CM | POA: Diagnosis not present

## 2023-11-21 DIAGNOSIS — I340 Nonrheumatic mitral (valve) insufficiency: Secondary | ICD-10-CM | POA: Insufficient documentation

## 2023-11-21 DIAGNOSIS — I5022 Chronic systolic (congestive) heart failure: Secondary | ICD-10-CM | POA: Insufficient documentation

## 2023-11-21 DIAGNOSIS — E785 Hyperlipidemia, unspecified: Secondary | ICD-10-CM | POA: Diagnosis not present

## 2023-11-21 DIAGNOSIS — E119 Type 2 diabetes mellitus without complications: Secondary | ICD-10-CM

## 2023-11-21 LAB — BASIC METABOLIC PANEL
Anion gap: 11 (ref 5–15)
BUN: 26 mg/dL — ABNORMAL HIGH (ref 8–23)
CO2: 29 mmol/L (ref 22–32)
Calcium: 9.7 mg/dL (ref 8.9–10.3)
Chloride: 97 mmol/L — ABNORMAL LOW (ref 98–111)
Creatinine, Ser: 1.11 mg/dL — ABNORMAL HIGH (ref 0.44–1.00)
GFR, Estimated: 52 mL/min — ABNORMAL LOW (ref >60)
Glucose, Bld: 154 mg/dL — ABNORMAL HIGH (ref 70–99)
Potassium: 4.5 mmol/L (ref 3.5–5.1)
Sodium: 137 mmol/L (ref 135–145)

## 2023-11-21 MED ORDER — CARVEDILOL 3.125 MG PO TABS: 3.1250 mg | ORAL_TABLET | Freq: Two times a day (BID) | ORAL | 3 refills | Status: DC

## 2023-11-21 MED ORDER — EZETIMIBE 10 MG PO TABS: 10.0000 mg | ORAL_TABLET | Freq: Every day | ORAL | 2 refills | Status: DC

## 2023-11-21 MED ORDER — AMIODARONE HCL 100 MG PO TABS: 100.0000 mg | ORAL_TABLET | Freq: Every day | ORAL | 3 refills | Status: DC

## 2023-11-21 NOTE — Progress Notes (Signed)
 Advanced [facility] Failure Clinic Note    PCP: Barbra Odor, NP Primary Cardiologist: Debby Sor, MD  [facility] Cardiologist: Dr. Cherrie   HPI: 78 y.o. female w/ h/o CAD s/p remote LAD and RCA PCI in 2011, Type 2DM and HLD, recently diagnosed w/ new systolic heart failure and severe MR by echo 11/24. 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 Severe MR also confirmed by TEE. AHF team  was consulted for HF optimization prior to CABG + MVR. She was optimized w/ milrinone  and diuresed w/ IV Lasix . Underwent CABG x4 (LIMA to LAD, SVG to diagonal, SVG to OM, SVG to acute marginal) + MVR by Dr. Kerrin. Weaned off pressors and transitioned to oral GDMT. Also developed PAF post op and placed on amio. Discharged home on 12/5.   Presented 10/02/23 for post hospital f/u to establish care with [facility]. Volume overloaded w/ 1-2+ b/l LEE with DOE. Compliant with all medications. Although UOP not robust on Lasix  so transitioned to Torsemide. Noted to have incisional drainage with plans to see CTS.  Of note, CXR with large L pleural effusion and underwent thoracentesis on 10/09/23 with removal of 1.4L pleural fluid and started on pred taper.  Today she returns for HF follow up with her daughter. Overall feeling fine. She is not SOB walking on flat ground, more limited by fatigue. Swelling has improved. Denies  palpitations, abnormal bleeding, CP, dizziness,or PND/Orthopnea. Appetite ok. No fever or chills. Weight at home 184 pounds. Taking all medications. + hand tremors, daughter doing sternal wound dressing changes. Completed CR orientation.   Past Medical History:  Diagnosis Date   CAD (coronary artery disease) 7/12-8/12   staged LAD/RCA DES   Diabetes mellitus (HCC)    Diastolic dysfunction 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    Dyslipidemia    HTN (hypertension)    Current Outpatient Medications  Medication Sig Dispense  Refill   acetaminophen  (TYLENOL ) 325 MG tablet Take 2 tablets (650 mg total) by mouth every 6 (six) hours as needed for mild pain (pain score 1-3).     amiodarone  (PACERONE ) 200 MG tablet Take 1 tablet (200 mg total) by mouth daily. 30 tablet 5   apixaban  (ELIQUIS ) 5 MG TABS tablet Take 1 tablet (5 mg total) by mouth 2 (two) times daily. 60 tablet 5   aspirin  EC 81 MG tablet Take 1 tablet (81 mg total) by mouth daily. Swallow whole.     BD INSULIN  SYRINGE U/F 1/2UNIT 31G X 5/16 0.3 ML MISC      Beta Carotene (VITAMIN A) 25000 UNIT capsule Take 25,000 Units by mouth daily.     cyanocobalamin (VITAMIN B12) 1000 MCG tablet Take 2,000 mcg by mouth daily.     dapagliflozin  propanediol (FARXIGA ) 10 MG TABS tablet Take 1 tablet (10 mg total) by mouth daily. 30 tablet 5   ezetimibe  (ZETIA ) 10 MG tablet TAKE 1 TABLET BY MOUTH EVERY DAY 90 tablet 2   fish oil-omega-3 fatty acids 1000 MG capsule Take 1 g by mouth daily.     furosemide  (LASIX ) 40 MG tablet Take 1.5 tablets (60 mg total) by mouth daily. 45 tablet 3   gabapentin  (NEURONTIN ) 100 MG capsule Take 100 mg by mouth 2 (two) times daily. Take 100 mg at  morning and bedtime     insulin  NPH-regular Human (NOVOLIN 70/30) (70-30) 100 UNIT/ML injection Inject 30-60 Units into the skin 2 (two) times daily.  loratadine  (CLARITIN ) 10 MG tablet Take 10 mg by mouth daily.     Magnesium  Hydroxide (DULCOLAX SOFT CHEWS PO) Take 600 mg by mouth daily.     Oyster Shell (OYSTER CALCIUM ) 500 MG TABS tablet Take 500 mg of elemental calcium  by mouth 2 (two) times a week.     OZEMPIC , 0.25 OR 0.5 MG/DOSE, 2 MG/3ML SOPN Inject 0.25 mg into the skin once a week for 28 days, THEN 0.5 mg once a week for 28 days. 3 mL 5   pantoprazole  (PROTONIX ) 40 MG tablet Take 1 tablet by mouth daily. Reported on 03/24/2016     sacubitril -valsartan  (ENTRESTO ) 24-26 MG Take 1 tablet by mouth 2 (two) times daily. 60 tablet 6   sennosides-docusate sodium  (SENOKOT-S) 8.6-50 MG tablet Take 1  tablet by mouth daily.     spironolactone  (ALDACTONE ) 25 MG tablet Take 1 tablet (25 mg total) by mouth daily. 30 tablet 5   traMADol  (ULTRAM ) 50 MG tablet Take 1 tablet (50 mg total) by mouth every 12 (twelve) hours as needed for severe pain (pain score 7-10). 20 tablet 0   No current facility-administered medications for this encounter.   Allergies  Allergen Reactions   Lisinopril Cough   Sulfa Antibiotics Hives   Social History   Socioeconomic History   Marital status: Married    Spouse name: Not on file   Number of children: Not on file   Years of education: Not on file   Highest education level: Not on file  Occupational History   Not on file  Tobacco Use   Smoking status: Never    Passive exposure: Never   Smokeless tobacco: Never  Vaping Use   Vaping status: Never Used  Substance and Sexual Activity   Alcohol use: Yes    Comment: Occasionally-very rare   Drug use: Never   Sexual activity: Not Currently    Partners: Male  Other Topics Concern   Not on file  Social History Narrative   Not on file   Social Drivers of Health   Financial Resource Strain: Not on file  Food Insecurity: Low Risk  (10/04/2023)   Received from [facility]   Hunger Vital Sign    Worried About Running Out of Food in the Last Year: Never true    Ran Out of Food in the Last Year: Never true  Transportation Needs: No Transportation Needs (10/04/2023)   Received from [facility]    In the past 12 months, has lack of reliable transportation kept you from medical appointments, meetings, work or from getting things needed for daily living? : No  Physical Activity: Not on file  Stress: Not on file  Social Connections: Not on file  Intimate Partner Violence: Not At Risk (08/28/2023)   Humiliation, Afraid, Rape, and Kick questionnaire    Fear of Current or Ex-Partner: No    Emotionally Abused: No    Physically Abused: No    Sexually Abused: No   Family History   Problem Relation Age of Onset   CAD Neg Hx     Wt Readings from Last 3 Encounters:  11/21/23 83.6 kg (184 lb 6.4 oz)  11/16/23 84.6 kg (186 lb 8.2 oz)  10/31/23 85.7 kg (189 lb)   BP 132/78   Pulse 99   Ht 5' 4 (1.626 m)   Wt 83.6 kg (184 lb 6.4 oz)   SpO2 94%   BMI 31.65 kg/m   PHYSICAL EXAM: General:  NAD. No  resp difficulty, walked into clinic HEENT: Normal Neck: Supple. No JVD. Cor: Regular rate & rhythm. No rubs, gallops or murmurs. Lungs: Clear Abdomen: Soft, obese,  nontender, nondistended.  Extremities: No cyanosis, clubbing, rash, 1+ BLE edema Neuro: Alert & oriented x 3, moves all 4 extremities w/o difficulty. Affect pleasant. + hand tremors  ECG (personally reviewed): NSR 98 bpm  ReDs reading: 28 %, normal  ASSESSMENT & PLAN: 1. Chronic Systolic Heart Failure  - Echo 2014 w/ normal EF 50-55%, normal RV - Echo 11/24 EF 25-30%, severe MR, RV low normal  - Ischemic CM. LHC w/ severe MVCAD.  - RHC 11/24 c/w decompensated heart failure, severe LV dysfunction, and severe mitral regurgitation (RA 21, PAP 83/37/56, PCWP 36, CO 4.11, CI 2.04) - TEE: EF 25%, RV severely reduced, severe LAE, 3-4+ MR - S/p CABG X 4 (LIMA to LAD, SVG to diagonal, SVG to OM, SVG to acute marginal) + MVR on 11/20 - Post Op Echo 12/24: EF 30-35%, s/p MV repair with trivial MR and mean gradient 5 mmHg.  - NYHA II, volume OK, ReDs normal at 28% - Start Coreg  3.125 mg bid - Continue Lasix  60 mg daily - Continue Entresto  24/26 mg bid.  - Continue Farxiga  10 mg daily. - Continue spironolactone  25 mg daily. - Wear compression hose. - Labs today - Repeat echo next visit   2. H/o Severe MR  - appeared ischemic w/ posterior leaflet tethering on echo 11/24. TEE 3-4+ MR - Now s/p MVR 11/20 at time of CABG on 11/20 - post repair echo w/ trivial MR and mod MS mean gradient 5 mmHg.  - Planning cardiac rehab   3. Severe MVCAD  - S/p CABG X 4 (LIMA to LAD, SVG to diagonal, SVG to OM, SVG to  acute marginal) on 11/20 - No chest pain - Continue ASA 81 mg daily  - Continue Crestor  40 mg daily  - Planning cardiac rehab  4. Post Op PAF/Atrial Tach  - NSR on ECG today. - Having worsening hand tremors. - Decrease amiodarone  to 100 mg daily. Plan to stop next visit. - Continue Eliquis . Denies bleeding.   5. Type 2DM  - Hgb A1c 7.8  - She is on insulin  - Per PCP   6. Sternal Wound - Per CTS - Daughter doing dressing changes - Has surgery follow up next week  Follow up in 2 months with Dr. Cherrie + echo  Diana [name], [place] 11/21/23

## 2023-11-21 NOTE — Patient Instructions (Signed)
 Medication Changes:  START: CARVEDILOL  3.125MG  TWICE DAILY   DECREASE AMIODARONE  TO 100MG  ONCE DAILY   Lab Work:  Labs done today, your results will be available in MyChart, we will contact you for abnormal readings.  Special Instructions // Education:  WHERE COMPRESSION STOCKINGS DAILY   Follow-Up in: 2 MONTHS WITH DR. CHERRIE PLEASE CALL OUR OFFICE AROUND MARCH  TO GET SCHEDULED FOR YOUR APPOINTMENT. PHONE NUMBER IS [phone] OPTION 2   At the [facility], you and your health needs are our priority. We have a designated team specialized in the treatment of Heart Failure. This Care Team includes your primary Heart Failure Specialized Cardiologist (physician), Advanced Practice Providers (APPs- Physician Assistants and Nurse Practitioners), and Pharmacist who all work together to provide you with the care you need, when you need it.   You may see any of the following providers on your designated Care Team at your next follow up:  Dr. Toribio Cherrie Dr. Ezra Shuck Dr. Ria Commander Dr. Odis Brownie Greig Mosses, NP Caffie Shed, [place] [facility] [place], [place] Beckey Coe, NP Jordan Lee, NP Tinnie Redman, PharmD   Please be sure to bring in all your medications bottles to every appointment.   Need to Contact Us :  If you have any questions or concerns before your next appointment please send us  a message through [place] or call our office at [phone].    TO LEAVE A MESSAGE FOR THE NURSE SELECT OPTION 2, PLEASE LEAVE A MESSAGE INCLUDING: YOUR NAME DATE OF BIRTH CALL BACK NUMBER REASON FOR CALL**this is important as we prioritize the call backs  YOU WILL RECEIVE A CALL BACK THE SAME DAY AS LONG AS YOU CALL BEFORE 4:00 PM

## 2023-11-21 NOTE — Progress Notes (Signed)
ReDS Vest / Clip - 11/21/23 1125       ReDS Vest / Clip   Station Marker B    Ruler Value 32    ReDS Value Range Low volume    ReDS Actual Value 28

## 2023-11-22 ENCOUNTER — Encounter (HOSPITAL_COMMUNITY)
Admission: RE | Admit: 2023-11-22 | Discharge: 2023-11-22 | Disposition: A | Discharge status: Home / Self Care | Payer: Medicare HMO | Source: Ambulatory Visit | Attending: Internal Medicine | Admitting: Internal Medicine

## 2023-11-22 DIAGNOSIS — E162 Hypoglycemia, unspecified: Secondary | ICD-10-CM | POA: Insufficient documentation

## 2023-11-22 DIAGNOSIS — Z951 Presence of aortocoronary bypass graft: Secondary | ICD-10-CM | POA: Diagnosis not present

## 2023-11-22 DIAGNOSIS — Z9889 Other specified postprocedural states: Secondary | ICD-10-CM | POA: Diagnosis not present

## 2023-11-22 LAB — GLUCOSE, CAPILLARY
Glucose-Capillary: 170 mg/dL — ABNORMAL HIGH (ref 70–99)
Glucose-Capillary: 120 mg/dL — ABNORMAL HIGH (ref 70–99)

## 2023-11-22 NOTE — Progress Notes (Signed)
 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 Swallow whole., Disp: , Rfl:    BD INSULIN  SYRINGE U/F 1/2UNIT 31G X 5/16 0.3 ML MISC, , Disp: , Rfl:    Beta Carotene (VITAMIN A) 25000 UNIT capsule, Take 25,000 Units by mouth daily., Disp: , Rfl:    carvedilol  (COREG ) 3.125 MG tablet, Take 1 tablet (3.125 mg total) by mouth 2 (two) times daily., Disp: 60 tablet, Rfl: 3   cyanocobalamin (VITAMIN B12) 1000 MCG tablet, Take 2,000 mcg by mouth daily., Disp: , Rfl:    dapagliflozin  propanediol (FARXIGA ) 10 MG TABS tablet, Take 1 tablet (10 mg total) by mouth daily., Disp: 30 tablet, Rfl: 5   ezetimibe  (ZETIA ) 10 MG tablet, Take 1 tablet (10 mg total) by mouth daily., Disp: 90 tablet, Rfl: 2   fish oil-omega-3 fatty acids 1000 MG capsule, Take 1 g by mouth daily., Disp: , Rfl:    furosemide  (LASIX ) 40 MG tablet, Take 1.5 tablets (60 mg total) by  mouth daily., Disp: 45 tablet, Rfl: 3   gabapentin  (NEURONTIN ) 100 MG capsule, Take 100 mg by mouth 2 (two) times daily. Take 100 mg at  morning and bedtime, Disp: , Rfl:    insulin  NPH-regular Human (NOVOLIN 70/30) (70-30) 100 UNIT/ML injection, Inject 30-60 Units into the skin 2 (two) times daily., Disp: , Rfl:    loratadine  (CLARITIN ) 10 MG tablet, Take 10 mg by mouth daily., Disp: , Rfl:    Magnesium  Hydroxide (DULCOLAX SOFT CHEWS PO), Take 600 mg by mouth daily., Disp: , Rfl:    Oyster Shell (OYSTER CALCIUM ) 500 MG TABS tablet, Take 500 mg of elemental calcium  by mouth 2 (two) times a week., Disp: , Rfl:    OZEMPIC , 0.25 OR 0.5 MG/DOSE, 2 MG/3ML SOPN, Inject 0.25 mg into the skin once a week for 28 days, THEN 0.5 mg once a week for 28 days., Disp: 3 mL, Rfl: 5   pantoprazole  (PROTONIX ) 40 MG tablet, Take 1 tablet by mouth daily. Reported on 03/24/2016, Disp: , Rfl:    sacubitril -valsartan  (ENTRESTO ) 24-26 MG, Take 1 tablet by mouth 2 (two) times daily., Disp: 60 tablet, Rfl: 6   sennosides-docusate sodium  (SENOKOT-S) 8.6-50 MG tablet, Take 1 tablet by mouth daily., Disp: , Rfl:    spironolactone  (ALDACTONE ) 25 MG tablet, Take 1 tablet (25 mg total) by mouth daily., Disp:  30 tablet, Rfl: 5   traMADol  (ULTRAM ) 50 MG tablet, Take 1 tablet (50 mg total) by mouth every 12 (twelve) hours as needed for severe pain (pain score 7-10)., Disp: 20 tablet, Rfl: 0  Past 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    Dyslipidemia    HTN (hypertension)     Tobacco Use: Social History   Tobacco Use  Smoking Status Never   Passive exposure: Never  Smokeless Tobacco Never    Labs: Review Flowsheet  More data exists      Latest Ref Rng & Units 09/17/2023 09/18/2023 09/19/2023 09/20/2023 09/21/2023  Labs for ITP Cardiac and Pulmonary Rehab  O2 Saturation % 54.6   59.6  78.6  54.5  56.7     Capillary Blood Glucose: Lab Results  Component Value Date   GLUCAP 120 (H) 11/22/2023   GLUCAP 170 (H) 11/22/2023   GLUCAP 121 (H) 09/21/2023   GLUCAP 136 (H) 09/21/2023   GLUCAP 120 (H) 09/20/2023     Exercise Target Goals: Exercise Program Goal: Individual exercise prescription set using results from initial 6 min walk test and THRR while considering  patient's activity barriers and safety.   Exercise Prescription Goal: Initial exercise prescription builds to 30-45 minutes a day of aerobic activity, 2-3 days per week.  Home exercise guidelines will be given to patient during program as part of exercise prescription that the participant will acknowledge.  Activity Barriers & Risk Stratification:  Activity Barriers & Cardiac Risk Stratification - 11/16/23 1132       Activity Barriers & Cardiac Risk Stratification   Activity Barriers Arthritis;Deconditioning;Shortness of Breath;Decreased Ventricular Function;Balance Concerns;Assistive Device    Cardiac Risk Stratification High   <5 METs on            6 Minute Walk:  6 Minute Walk     Row Name 11/16/23 1131         6 Minute Walk   Phase Initial     Distance 720 feet     Walk Time 6 minutes     # of Rest Breaks 0     MPH 1.36     METS 1.38     RPE 9     Perceived Dyspnea  1     VO2 Peak 4.84     Symptoms Yes (comment)     Comments a little winded- resolved with rest. Used Rollator.     Resting HR 97 bpm     Resting BP 106/60     Resting Oxygen Saturation  94 %     Exercise Oxygen Saturation  during 6 min walk 94 %     Max Ex. HR 118 bpm     Max Ex. BP 118/70     2 Minute Post BP 110/68              Oxygen Initial Assessment:   Oxygen Re-Evaluation:   Oxygen Discharge (Final Oxygen Re-Evaluation):   Initial Exercise Prescription:  Initial Exercise Prescription - 11/16/23 1100       Date of Initial Exercise RX and Referring Provider   Date 11/16/23     Referring Provider Toribio Fuel, MD    Expected Discharge Date 02/10/24      NuStep   Level 1    SPM 60    Minutes 15  METs 1.6      Prescription Details   Frequency (times per week) 3    Duration Progress to 30 minutes of continuous aerobic without signs/symptoms of physical distress      Intensity   THRR 40-80% of Max Heartrate 58-118    Ratings of Perceived Exertion 11-13    Perceived Dyspnea 0-4      Progression   Progression Continue progressive overload as per policy without signs/symptoms or physical distress.      Resistance Training   Training Prescription Yes    Weight 2    Reps 10-15             Perform Capillary Blood Glucose checks as needed.  Exercise Prescription Changes:   Exercise Prescription Changes     Row Name 11/22/23 1028             Response to Exercise   Blood Pressure (Admit) 110/58       Blood Pressure (Exercise) 108/60       Blood Pressure (Exit) 108/60       Heart Rate (Admit) 104 bpm       Heart Rate (Exercise) 107 bpm       Heart Rate (Exit) 100 bpm       Rating of Perceived Exertion (Exercise) 11       Symptoms None       Comments Off to a good start with exercise.       Duration Progress to 30 minutes of  aerobic without signs/symptoms of physical distress       Intensity THRR unchanged         Progression   Progression Continue to progress workloads to maintain intensity without signs/symptoms of physical distress.       Average METs 1.6         Resistance Training   Training Prescription Yes       Weight 2       Reps 10-15         Interval Training   Interval Training No         NuStep   Level 1       SPM 64       Minutes 25       METs 1.6                Exercise Comments:   Exercise Comments     Row Name 11/22/23 1132           Exercise Comments Camilia tolerated low intensity exercise well without symptoms.                Exercise Goals and Review:   Exercise Goals     Row  Name 11/16/23 1133             Exercise Goals   Increase Physical Activity Yes       Intervention Develop an individualized exercise prescription for aerobic and resistive training based on initial evaluation findings, risk stratification, comorbidities and participant's personal goals.;Provide advice, education, support and counseling about physical activity/exercise needs.       Expected Outcomes Short Term: Attend rehab on a regular basis to increase amount of physical activity.;Long Term: Exercising regularly at least 3-5 days a week.;Long Term: Add in home exercise to make exercise part of routine and to increase amount of physical activity.       Increase Strength and Stamina Yes       Intervention Provide advice, education, support  and counseling about physical activity/exercise needs.;Develop an individualized exercise prescription for aerobic and resistive training based on initial evaluation findings, risk stratification, comorbidities and participant's personal goals.       Expected Outcomes Short Term: Increase workloads from initial exercise prescription for resistance, speed, and METs.;Short Term: Perform resistance training exercises routinely during rehab and add in resistance training at home;Long Term: Improve cardiorespiratory fitness, muscular endurance and strength as measured by increased METs and functional capacity ( )       Able to understand and use rate of perceived exertion (RPE) scale Yes       Intervention Provide education and explanation on how to use RPE scale       Expected Outcomes Short Term: Able to use RPE daily in rehab to express subjective intensity level;Long Term:  Able to use RPE to guide intensity level when exercising independently       Knowledge and understanding of Target Heart Rate Range (THRR) Yes       Intervention Provide education and explanation of THRR including how the numbers were predicted and where they are located for reference        Expected Outcomes Short Term: Able to state/look up THRR;Short Term: Able to use daily as guideline for intensity in rehab;Long Term: Able to use THRR to govern intensity when exercising independently       Understanding of Exercise Prescription Yes       Intervention Provide education, explanation, and written materials on patient's individual exercise prescription       Expected Outcomes Short Term: Able to explain program exercise prescription;Long Term: Able to explain home exercise prescription to exercise independently                Exercise Goals Re-Evaluation :  Exercise Goals Re-Evaluation     Row Name 11/22/23 1132             Exercise Goal Re-Evaluation   Exercise Goals Review Increase Physical Activity;Increase Strength and Stamina;Able to understand and use rate of perceived exertion (RPE) scale       Comments Patient able to understand and use RPE scale appropriately.       Expected Outcomes Progress duration to achieve 30 minutes of exercise.                Discharge Exercise Prescription (Final Exercise Prescription Changes):  Exercise Prescription Changes - 11/22/23 1028       Response to Exercise   Blood Pressure (Admit) 110/58    Blood Pressure (Exercise) 108/60    Blood Pressure (Exit) 108/60    Heart Rate (Admit) 104 bpm    Heart Rate (Exercise) 107 bpm    Heart Rate (Exit) 100 bpm    Rating of Perceived Exertion (Exercise) 11    Symptoms None    Comments Off to a good start with exercise.    Duration Progress to 30 minutes of  aerobic without signs/symptoms of physical distress    Intensity THRR unchanged      Progression   Progression Continue to progress workloads to maintain intensity without signs/symptoms of physical distress.    Average METs 1.6      Resistance Training   Training Prescription Yes    Weight 2    Reps 10-15      Interval Training   Interval Training No      NuStep   Level 1    SPM 64    Minutes 25    METs 1.6  Nutrition:  Target Goals: Understanding of nutrition guidelines, daily intake of sodium 1500mg , cholesterol 200mg , calories 30% from fat and 7% or less from saturated fats, daily to have 5 or more servings of fruits and vegetables.  Biometrics:  Pre Biometrics - 11/16/23 1130       Pre Biometrics   Waist Circumference 46 inches    Hip Circumference 46 inches    Waist to Hip Ratio 1 %    Triceps Skinfold 37 mm    % Body Fat 47.3 %    Grip Strength 20 kg    Flexibility 0 in   not done, low back pain   Single Leg Stand 1.87 seconds              Nutrition Therapy Plan and Nutrition Goals:   Nutrition Assessments:  MEDIFICTS Score Key: >=70 Need to make dietary changes  40-70 Heart Healthy Diet <= 40 Therapeutic Level Cholesterol Diet    Picture Your Plate Scores: <59 Unhealthy dietary pattern with much room for improvement. 41-50 Dietary pattern unlikely to meet recommendations for good health and room for improvement. 51-60 More healthful dietary pattern, with some room for improvement.  >60 Healthy dietary pattern, although there may be some specific behaviors that could be improved.    Nutrition Goals Re-Evaluation:   Nutrition Goals Re-Evaluation:   Nutrition Goals Discharge (Final Nutrition Goals Re-Evaluation):   Psychosocial: Target Goals: Acknowledge presence or absence of significant depression and/or stress, maximize coping skills, provide positive support system. Participant is able to verbalize types and ability to use techniques and skills needed for reducing stress and depression.  Initial Review & Psychosocial Screening:  Initial Psych Review & Screening - 11/16/23 1134       Initial Review   Current issues with None Identified      Family Dynamics   Good Support System? Yes   Danella has her husband and children for support   Comments Jovonne denies any depression/ anxiety/ stress. She is still very fatigued with ADL's and  shared that she is frustrated with how slow progress has been. She is ready to return to her baseline.      Barriers   Psychosocial barriers to participate in program There are no identifiable barriers or psychosocial needs.      Screening Interventions   Interventions Encouraged to exercise;Provide feedback about the scores to participant    Expected Outcomes Short Term goal: Identification and review with participant of any Quality of Life or Depression concerns found by scoring the questionnaire.;Long Term goal: The participant improves quality of Life and PHQ9 Scores as seen by post scores and/or verbalization of changes             Quality of Life Scores:  Quality of Life - 11/16/23 1137       Quality of Life   Select Quality of Life      Quality of Life Scores   Health/Function Pre 19 %    Socioeconomic Pre 29 %    Psych/Spiritual Pre 28.29 %    Family Pre 28.8 %    GLOBAL Pre 24.8 %            Scores of 19 and below usually indicate a poorer quality of life in these areas.  A difference of  2-3 points is a clinically meaningful difference.  A difference of 2-3 points in the total score of the Quality of Life Index has been associated with significant improvement in overall quality of life,  self-image, physical symptoms, and general health in studies assessing change in quality of life.  PHQ-9: Review Flowsheet       11/16/2023 03/24/2016  Depression screen PHQ 2/9  Decreased Interest 0 0  Down, Depressed, Hopeless 0 0  PHQ - 2 Score 0 0  Altered sleeping 0 -  Tired, decreased energy 0 -  Change in appetite 0 -  Feeling bad or failure about yourself  0 -  Trouble concentrating 0 -  Moving slowly or fidgety/restless 0 -  Suicidal thoughts 0 -  PHQ-9 Score 0 -  Difficult doing work/chores Not difficult at all -   Interpretation of Total Score  Total Score Depression Severity:  1-4 = Minimal depression, 5-9 = Mild depression, 10-14 = Moderate depression, 15-19  = Moderately severe depression, 20-27 = Severe depression   Psychosocial Evaluation and Intervention:   Psychosocial Re-Evaluation:  Psychosocial Re-Evaluation     Row Name 11/22/23 1132             Psychosocial Re-Evaluation   Current issues with None Identified       Comments Shuntel did not voice any increased concerns or stressors on her first day of exercise.       Continue Psychosocial Services  Follow up required by staff                Psychosocial Discharge (Final Psychosocial Re-Evaluation):  Psychosocial Re-Evaluation - 11/22/23 1132       Psychosocial Re-Evaluation   Current issues with None Identified    Comments Shawnika did not voice any increased concerns or stressors on her first day of exercise.    Continue Psychosocial Services  Follow up required by staff             Vocational Rehabilitation: Provide vocational rehab assistance to qualifying candidates.   Vocational Rehab Evaluation & Intervention:  Vocational Rehab - 11/16/23 1138       Initial Vocational Rehab Evaluation & Intervention   Assessment shows need for Vocational Rehabilitation No   Everlene is working            Education: Education Goals: Education classes will be provided on a weekly basis, covering required topics. Participant will state understanding/return demonstration of topics presented.     Core Videos: Exercise    Move It!  Clinical staff conducted group or individual video education with verbal and written material and guidebook.  Patient learns the recommended Pritikin exercise program. Exercise with the goal of living a long, healthy life. Some of the health benefits of exercise include controlled diabetes, healthier blood pressure levels, improved cholesterol levels, improved heart and lung capacity, improved sleep, and better body composition. Everyone should speak with their doctor before starting or changing an exercise routine.  Biomechanical  Limitations Clinical staff conducted group or individual video education with verbal and written material and guidebook.  Patient learns how biomechanical limitations can impact exercise and how we can mitigate and possibly overcome limitations to have an impactful and balanced exercise routine.  Body Composition Clinical staff conducted group or individual video education with verbal and written material and guidebook.  Patient learns that body composition (ratio of muscle mass to fat mass) is a key component to assessing overall fitness, rather than body weight alone. Increased fat mass, especially visceral belly fat, can put us  at increased risk for metabolic syndrome, type 2 diabetes, heart disease, and even death. It is recommended to combine diet and exercise (cardiovascular and resistance training) to improve  your body composition. Seek guidance from your physician and exercise physiologist before implementing an exercise routine.  Exercise Action Plan Clinical staff conducted group or individual video education with verbal and written material and guidebook.  Patient learns the recommended strategies to achieve and enjoy long-term exercise adherence, including variety, self-motivation, self-efficacy, and positive decision making. Benefits of exercise include fitness, good health, weight management, more energy, better sleep, less stress, and overall well-being.  Medical   Heart Disease Risk Reduction Clinical staff conducted group or individual video education with verbal and written material and guidebook.  Patient learns our heart is our most vital organ as it circulates oxygen, nutrients, white blood cells, and hormones throughout the entire body, and carries waste away. Data supports a plant-based eating plan like the Pritikin Program for its effectiveness in slowing progression of and reversing heart disease. The video provides a number of recommendations to address heart  disease.   Metabolic Syndrome and Belly Fat  Clinical staff conducted group or individual video education with verbal and written material and guidebook.  Patient learns what metabolic syndrome is, how it leads to heart disease, and how one can reverse it and keep it from coming back. You have metabolic syndrome if you have 3 of the following 5 criteria: abdominal obesity, high blood pressure, high triglycerides, low HDL cholesterol, and high blood sugar.  Hypertension and Heart Disease Clinical staff conducted group or individual video education with verbal and written material and guidebook.  Patient learns that high blood pressure, or hypertension, is very common in the [place] . Hypertension is largely due to excessive salt intake, but other important risk factors include being overweight, physical inactivity, drinking too much alcohol, smoking, and not eating enough potassium from fruits and vegetables. High blood pressure is a leading risk factor for heart attack, stroke, congestive heart failure, dementia, kidney failure, and premature death. Long-term effects of excessive salt intake include stiffening of the arteries and thickening of heart muscle and organ damage. Recommendations include ways to reduce hypertension and the risk of heart disease.  Diseases of Our Time - Focusing on Diabetes Clinical staff conducted group or individual video education with verbal and written material and guidebook.  Patient learns why the best way to stop diseases of our time is prevention, through food and other lifestyle changes. Medicine (such as prescription pills and surgeries) is often only a Band-Aid on the problem, not a long-term solution. Most common diseases of our time include obesity, type 2 diabetes, hypertension, heart disease, and cancer. The Pritikin Program is recommended and has been proven to help reduce, reverse, and/or prevent the damaging effects of metabolic syndrome.  Nutrition    Overview of the Pritikin Eating Plan  Clinical staff conducted group or individual video education with verbal and written material and guidebook.  Patient learns about the Pritikin Eating Plan for disease risk reduction. The Pritikin Eating Plan emphasizes a wide variety of unrefined, minimally-processed carbohydrates, like fruits, vegetables, whole grains, and legumes. Go, Caution, and Stop food choices are explained. Plant-based and lean animal proteins are emphasized. Rationale provided for low sodium intake for blood pressure control, low added sugars for blood sugar stabilization, and low added fats and oils for coronary artery disease risk reduction and weight management.  Calorie Density  Clinical staff conducted group or individual video education with verbal and written material and guidebook.  Patient learns about calorie density and how it impacts the Pritikin Eating Plan. Knowing the characteristics of the food you choose  will help you decide whether those foods will lead to weight gain or weight loss, and whether you want to consume more or less of them. Weight loss is usually a side effect of the Pritikin Eating Plan because of its focus on low calorie-dense foods.  Label Reading  Clinical staff conducted group or individual video education with verbal and written material and guidebook.  Patient learns about the Pritikin recommended label reading guidelines and corresponding recommendations regarding calorie density, added sugars, sodium content, and whole grains.  Dining Out - Part 1  Clinical staff conducted group or individual video education with verbal and written material and guidebook.  Patient learns that restaurant meals can be sabotaging because they can be so high in calories, fat, sodium, and/or sugar. Patient learns recommended strategies on how to positively address this and avoid unhealthy pitfalls.  Facts on Fats  Clinical staff conducted group or individual video  education with verbal and written material and guidebook.  Patient learns that lifestyle modifications can be just as effective, if not more so, as many medications for lowering your risk of heart disease. A Pritikin lifestyle can help to reduce your risk of inflammation and atherosclerosis (cholesterol build-up, or plaque, in the artery walls). Lifestyle interventions such as dietary choices and physical activity address the cause of atherosclerosis. A review of the types of fats and their impact on blood cholesterol levels, along with dietary recommendations to reduce fat intake is also included.  Nutrition Action Plan  Clinical staff conducted group or individual video education with verbal and written material and guidebook.  Patient learns how to incorporate Pritikin recommendations into their lifestyle. Recommendations include planning and keeping personal health goals in mind as an important part of their success.  Healthy Mind-Set    Healthy Minds, Bodies, Hearts  Clinical staff conducted group or individual video education with verbal and written material and guidebook.  Patient learns how to identify when they are stressed. Video will discuss the impact of that stress, as well as the many benefits of stress management. Patient will also be introduced to stress management techniques. The way we think, act, and feel has an impact on our hearts.  How Our Thoughts Can Heal Our Hearts  Clinical staff conducted group or individual video education with verbal and written material and guidebook.  Patient learns that negative thoughts can cause depression and anxiety. This can result in negative lifestyle behavior and serious health problems. Cognitive behavioral therapy is an effective method to help control our thoughts in order to change and improve our emotional outlook.  Additional Videos:  Exercise    Improving Performance  Clinical staff conducted group or individual video education with  verbal and written material and guidebook.  Patient learns to use a non-linear approach by alternating intensity levels and lengths of time spent exercising to help burn more calories and lose more body fat. Cardiovascular exercise helps improve heart health, metabolism, hormonal balance, blood sugar control, and recovery from fatigue. Resistance training improves strength, endurance, balance, coordination, reaction time, metabolism, and muscle mass. Flexibility exercise improves circulation, posture, and balance. Seek guidance from your physician and exercise physiologist before implementing an exercise routine and learn your capabilities and proper form for all exercise.  Introduction to Yoga  Clinical staff conducted group or individual video education with verbal and written material and guidebook.  Patient learns about yoga, a discipline of the coming together of mind, breath, and body. The benefits of yoga include improved flexibility, improved range  of motion, better posture and core strength, increased lung function, weight loss, and positive self-image. Yoga's heart health benefits include lowered blood pressure, healthier heart rate, decreased cholesterol and triglyceride levels, improved immune function, and reduced stress. Seek guidance from your physician and exercise physiologist before implementing an exercise routine and learn your capabilities and proper form for all exercise.  Medical   Aging: Enhancing Your Quality of Life  Clinical staff conducted group or individual video education with verbal and written material and guidebook.  Patient learns key strategies and recommendations to stay in good physical health and enhance quality of life, such as prevention strategies, having an advocate, securing a Health Care Proxy and Power of Attorney, and keeping a list of medications and system for tracking them. It also discusses how to avoid risk for bone loss.  Biology of Weight Control   Clinical staff conducted group or individual video education with verbal and written material and guidebook.  Patient learns that weight gain occurs because we consume more calories than we burn (eating more, moving less). Even if your body weight is normal, you may have higher ratios of fat compared to muscle mass. Too much body fat puts you at increased risk for cardiovascular disease, heart attack, stroke, type 2 diabetes, and obesity-related cancers. In addition to exercise, following the Pritikin Eating Plan can help reduce your risk.  Decoding Lab Results  Clinical staff conducted group or individual video education with verbal and written material and guidebook.  Patient learns that lab test reflects one measurement whose values change over time and are influenced by many factors, including medication, stress, sleep, exercise, food, hydration, pre-existing medical conditions, and more. It is recommended to use the knowledge from this video to become more involved with your lab results and evaluate your numbers to speak with your doctor.   Diseases of Our Time - Overview  Clinical staff conducted group or individual video education with verbal and written material and guidebook.  Patient learns that according to the CDC, 50% to 70% of chronic diseases (such as obesity, type 2 diabetes, elevated lipids, hypertension, and heart disease) are avoidable through lifestyle improvements including healthier food choices, listening to satiety cues, and increased physical activity.  Sleep Disorders Clinical staff conducted group or individual video education with verbal and written material and guidebook.  Patient learns how good quality and duration of sleep are important to overall health and well-being. Patient also learns about sleep disorders and how they impact health along with recommendations to address them, including discussing with a physician.  Nutrition  Dining Out - Part 2 Clinical staff  conducted group or individual video education with verbal and written material and guidebook.  Patient learns how to plan ahead and communicate in order to maximize their dining experience in a healthy and nutritious manner. Included are recommended food choices based on the type of restaurant the patient is visiting.   Fueling a Banker conducted group or individual video education with verbal and written material and guidebook.  There is a strong connection between our food choices and our health. Diseases like obesity and type 2 diabetes are very prevalent and are in large-part due to lifestyle choices. The Pritikin Eating Plan provides plenty of food and hunger-curbing satisfaction. It is easy to follow, affordable, and helps reduce health risks.  Menu Workshop  Clinical staff conducted group or individual video education with verbal and written material and guidebook.  Patient learns that restaurant meals can  sabotage health goals because they are often packed with calories, fat, sodium, and sugar. Recommendations include strategies to plan ahead and to communicate with the manager, chef, or server to help order a healthier meal.  Planning Your Eating Strategy  Clinical staff conducted group or individual video education with verbal and written material and guidebook.  Patient learns about the Pritikin Eating Plan and its benefit of reducing the risk of disease. The Pritikin Eating Plan does not focus on calories. Instead, it emphasizes high-quality, nutrient-rich foods. By knowing the characteristics of the foods, we choose, we can determine their calorie density and make informed decisions.  Targeting Your Nutrition Priorities  Clinical staff conducted group or individual video education with verbal and written material and guidebook.  Patient learns that lifestyle habits have a tremendous impact on disease risk and progression. This video provides eating and physical  activity recommendations based on your personal health goals, such as reducing LDL cholesterol, losing weight, preventing or controlling type 2 diabetes, and reducing high blood pressure.  Vitamins and Minerals  Clinical staff conducted group or individual video education with verbal and written material and guidebook.  Patient learns different ways to obtain key vitamins and minerals, including through a recommended healthy diet. It is important to discuss all supplements you take with your doctor.   Healthy Mind-Set    Smoking Cessation  Clinical staff conducted group or individual video education with verbal and written material and guidebook.  Patient learns that cigarette smoking and tobacco addiction pose a serious health risk which affects millions of people. Stopping smoking will significantly reduce the risk of heart disease, lung disease, and many forms of cancer. Recommended strategies for quitting are covered, including working with your doctor to develop a successful plan.  Culinary   Becoming a Set Designer conducted group or individual video education with verbal and written material and guidebook.  Patient learns that cooking at home can be healthy, cost-effective, quick, and puts them in control. Keys to cooking healthy recipes will include looking at your recipe, assessing your equipment needs, planning ahead, making it simple, choosing cost-effective seasonal ingredients, and limiting the use of added fats, salts, and sugars.  Cooking - Breakfast and Snacks  Clinical staff conducted group or individual video education with verbal and written material and guidebook.  Patient learns how important breakfast is to satiety and nutrition through the entire day. Recommendations include key foods to eat during breakfast to help stabilize blood sugar levels and to prevent overeating at meals later in the day. Planning ahead is also a key component.  Cooking - Psychologist, Educational conducted group or individual video education with verbal and written material and guidebook.  Patient learns eating strategies to improve overall health, including an approach to cook more at home. Recommendations include thinking of animal protein as a side on your plate rather than center stage and focusing instead on lower calorie dense options like vegetables, fruits, whole grains, and plant-based proteins, such as beans. Making sauces in large quantities to freeze for later and leaving the skin on your vegetables are also recommended to maximize your experience.  Cooking - Healthy Salads and Dressing Clinical staff conducted group or individual video education with verbal and written material and guidebook.  Patient learns that vegetables, fruits, whole grains, and legumes are the foundations of the Pritikin Eating Plan. Recommendations include how to incorporate each of these in flavorful and healthy salads, and how to create  homemade salad dressings. Proper handling of ingredients is also covered. Cooking - Soups and [employer] - Soups and Desserts Clinical staff conducted group or individual video education with verbal and written material and guidebook.  Patient learns that Pritikin soups and desserts make for easy, nutritious, and delicious snacks and meal components that are low in sodium, fat, sugar, and calorie density, while high in vitamins, minerals, and filling fiber. Recommendations include simple and healthy ideas for soups and desserts.   Overview     The Pritikin Solution Program Overview Clinical staff conducted group or individual video education with verbal and written material and guidebook.  Patient learns that the results of the Pritikin Program have been documented in more than 100 articles published in peer-reviewed journals, and the benefits include reducing risk factors for (and, in some cases, even reversing) high cholesterol, high  blood pressure, type 2 diabetes, obesity, and more! An overview of the three key pillars of the Pritikin Program will be covered: eating well, doing regular exercise, and having a healthy mind-set.  WORKSHOPS  Exercise: Exercise Basics: Building Your Action Plan Clinical staff led group instruction and group discussion with PowerPoint presentation and patient guidebook. To enhance the learning environment the use of posters, models and videos may be added. At the conclusion of this workshop, patients will comprehend the difference between physical activity and exercise, as well as the benefits of incorporating both, into their routine. Patients will understand the FITT (Frequency, Intensity, Time, and Type) principle and how to use it to build an exercise action plan. In addition, safety concerns and other considerations for exercise and cardiac rehab will be addressed by the presenter. The purpose of this lesson is to promote a comprehensive and effective weekly exercise routine in order to improve patients' overall level of fitness.   Managing Heart Disease: Your Path to a Healthier Heart Clinical staff led group instruction and group discussion with PowerPoint presentation and patient guidebook. To enhance the learning environment the use of posters, models and videos may be added.At the conclusion of this workshop, patients will understand the anatomy and physiology of the heart. Additionally, they will understand how Pritikin's three pillars impact the risk factors, the progression, and the management of heart disease.  The purpose of this lesson is to provide a high-level overview of the heart, heart disease, and how the Pritikin lifestyle positively impacts risk factors.  Exercise Biomechanics Clinical staff led group instruction and group discussion with PowerPoint presentation and patient guidebook. To enhance the learning environment the use of posters, models and videos may be added.  Patients will learn how the structural parts of their bodies function and how these functions impact their daily activities, movement, and exercise. Patients will learn how to promote a neutral spine, learn how to manage pain, and identify ways to improve their physical movement in order to promote healthy living. The purpose of this lesson is to expose patients to common physical limitations that impact physical activity. Participants will learn practical ways to adapt and manage aches and pains, and to minimize their effect on regular exercise. Patients will learn how to maintain good posture while sitting, walking, and lifting.  Balance Training and Fall Prevention  Clinical staff led group instruction and group discussion with PowerPoint presentation and patient guidebook. To enhance the learning environment the use of posters, models and videos may be added. At the conclusion of this workshop, patients will understand the importance of their sensorimotor skills (vision, proprioception, and the  vestibular system) in maintaining their ability to balance as they age. Patients will apply a variety of balancing exercises that are appropriate for their current level of function. Patients will understand the common causes for poor balance, possible solutions to these problems, and ways to modify their physical environment in order to minimize their fall risk. The purpose of this lesson is to teach patients about the importance of maintaining balance as they age and ways to minimize their risk of falling.  WORKSHOPS   Nutrition:  Fueling a Ship Broker led group instruction and group discussion with PowerPoint presentation and patient guidebook. To enhance the learning environment the use of posters, models and videos may be added. Patients will review the foundational principles of the Pritikin Eating Plan and understand what constitutes a serving size in each of the food groups.  Patients will also learn Pritikin-friendly foods that are better choices when away from home and review make-ahead meal and snack options. Calorie density will be reviewed and applied to three nutrition priorities: weight maintenance, weight loss, and weight gain. The purpose of this lesson is to reinforce (in a group setting) the key concepts around what patients are recommended to eat and how to apply these guidelines when away from home by planning and selecting Pritikin-friendly options. Patients will understand how calorie density may be adjusted for different weight management goals.  Mindful Eating  Clinical staff led group instruction and group discussion with PowerPoint presentation and patient guidebook. To enhance the learning environment the use of posters, models and videos may be added. Patients will briefly review the concepts of the Pritikin Eating Plan and the importance of low-calorie dense foods. The concept of mindful eating will be introduced as well as the importance of paying attention to internal hunger signals. Triggers for non-hunger eating and techniques for dealing with triggers will be explored. The purpose of this lesson is to provide patients with the opportunity to review the basic principles of the Pritikin Eating Plan, discuss the value of eating mindfully and how to measure internal cues of hunger and fullness using the Hunger Scale. Patients will also discuss reasons for non-hunger eating and learn strategies to use for controlling emotional eating.  Targeting Your Nutrition Priorities Clinical staff led group instruction and group discussion with PowerPoint presentation and patient guidebook. To enhance the learning environment the use of posters, models and videos may be added. Patients will learn how to determine their genetic susceptibility to disease by reviewing their family history. Patients will gain insight into the importance of diet as part of an overall healthy  lifestyle in mitigating the impact of genetics and other environmental insults. The purpose of this lesson is to provide patients with the opportunity to assess their personal nutrition priorities by looking at their family history, their own health history and current risk factors. Patients will also be able to discuss ways of prioritizing and modifying the Pritikin Eating Plan for their highest risk areas  Menu  Clinical staff led group instruction and group discussion with PowerPoint presentation and patient guidebook. To enhance the learning environment the use of posters, models and videos may be added. Using menus brought in from [employer], or printed from [employer], patients will apply the Pritikin dining out guidelines that were presented in the [employer] video. Patients will also be able to practice these guidelines in a variety of provided scenarios. The purpose of this lesson is to provide patients with the opportunity to  practice hands-on learning of the [employer] guidelines with actual menus and practice scenarios.  Label Reading Clinical staff led group instruction and group discussion with PowerPoint presentation and patient guidebook. To enhance the learning environment the use of posters, models and videos may be added. Patients will review and discuss the Pritikin label reading guidelines presented in Pritikin's Label Reading Educational series video. Using fool labels brought in from local grocery stores and markets, patients will apply the label reading guidelines and determine if the packaged food meet the Pritikin guidelines. The purpose of this lesson is to provide patients with the opportunity to review, discuss, and practice hands-on learning of the Pritikin Label Reading guidelines with actual packaged food labels. Cooking School  Pritikin's [employer] are designed to teach patients ways to prepare quick, simple, and  affordable recipes at home. The importance of nutrition's role in chronic disease risk reduction is reflected in its emphasis in the overall Pritikin program. By learning how to prepare essential core Pritikin Eating Plan recipes, patients will increase control over what they eat; be able to customize the flavor of foods without the use of added salt, sugar, or fat; and improve the quality of the food they consume. By learning a set of core recipes which are easily assembled, quickly prepared, and affordable, patients are more likely to prepare more healthy foods at home. These workshops focus on convenient breakfasts, simple entres, side dishes, and desserts which can be prepared with minimal effort and are consistent with nutrition recommendations for cardiovascular risk reduction. Cooking [employer] are taught by a armed forces logistics/support/administrative officer (RD) who has been trained by the [employer]. The chef or RD has a clear understanding of the importance of minimizing - if not completely eliminating - added fat, sugar, and sodium in recipes. Throughout the series of Cooking School Workshop sessions, patients will learn about healthy ingredients and efficient methods of cooking to build confidence in their capability to prepare    Cooking School weekly topics:  Adding Flavor- Sodium-Free  Fast and Healthy Breakfasts  Powerhouse Plant-Based Proteins  Satisfying Salads and Dressings  Simple Sides and Sauces  International Cuisine-Spotlight on the [employer] Zones  Delicious Desserts  Savory Soups  [employer] - Meals in a Astronomer Appetizers and Snacks  Comforting Weekend Breakfasts  One-Pot Wonders   Fast Evening Meals  Landscape Architect Your Pritikin Plate  WORKSHOPS   Healthy Mindset (Psychosocial):  Focused Goals, Sustainable Changes Clinical staff led group instruction and group discussion with PowerPoint presentation and patient guidebook. To enhance  the learning environment the use of posters, models and videos may be added. Patients will be able to apply effective goal setting strategies to establish at least one personal goal, and then take consistent, meaningful action toward that goal. They will learn to identify common barriers to achieving personal goals and develop strategies to overcome them. Patients will also gain an understanding of how our mind-set can impact our ability to achieve goals and the importance of cultivating a positive and growth-oriented mind-set. The purpose of this lesson is to provide patients with a deeper understanding of how to set and achieve personal goals, as well as the tools and strategies needed to overcome common obstacles which may arise along the way.  From Head to Heart: The Power of a Healthy Outlook  Clinical staff led group instruction and group discussion with PowerPoint presentation and patient guidebook. To enhance the learning environment the  use of posters, models and videos may be added. Patients will be able to recognize and describe the impact of emotions and mood on physical health. They will discover the importance of self-care and explore self-care practices which may work for them. Patients will also learn how to utilize the 4 C's to cultivate a healthier outlook and better manage stress and challenges. The purpose of this lesson is to demonstrate to patients how a healthy outlook is an essential part of maintaining good health, especially as they continue their cardiac rehab journey.  Healthy Sleep for a Healthy Heart Clinical staff led group instruction and group discussion with PowerPoint presentation and patient guidebook. To enhance the learning environment the use of posters, models and videos may be added. At the conclusion of this workshop, patients will be able to demonstrate knowledge of the importance of sleep to overall health, well-being, and quality of life. They will understand the  symptoms of, and treatments for, common sleep disorders. Patients will also be able to identify daytime and nighttime behaviors which impact sleep, and they will be able to apply these tools to help manage sleep-related challenges. The purpose of this lesson is to provide patients with a general overview of sleep and outline the importance of quality sleep. Patients will learn about a few of the most common sleep disorders. Patients will also be introduced to the concept of "sleep hygiene," and discover ways to self-manage certain sleeping problems through simple daily behavior changes. Finally, the workshop will motivate patients by clarifying the links between quality sleep and their goals of heart-healthy living.   Recognizing and Reducing Stress Clinical staff led group instruction and group discussion with PowerPoint presentation and patient guidebook. To enhance the learning environment the use of posters, models and videos may be added. At the conclusion of this workshop, patients will be able to understand the types of stress reactions, differentiate between acute and chronic stress, and recognize the impact that chronic stress has on their health. They will also be able to apply different coping mechanisms, such as reframing negative self-talk. Patients will have the opportunity to practice a variety of stress management techniques, such as deep abdominal breathing, progressive muscle relaxation, and/or guided imagery.  The purpose of this lesson is to educate patients on the role of stress in their lives and to provide healthy techniques for coping with it.  Learning Barriers/Preferences:  Learning Barriers/Preferences - 11/16/23 1137       Learning Barriers/Preferences   Learning Barriers Sight   wears glasses   Learning Preferences Audio;Computer/Internet;Group Instruction;Individual Instruction;Pictoral;Skilled Demonstration;Video;Verbal Instruction;Written Material              Education Topics:  Knowledge Questionnaire Score:  Knowledge Questionnaire Score - 11/16/23 1138       Knowledge Questionnaire Score   Pre Score 18/24             Core Components/Risk Factors/Patient Goals at Admission:  Personal Goals and Risk Factors at Admission - 11/16/23 1138       Core Components/Risk Factors/Patient Goals on Admission    Weight Management Yes;Obesity    Intervention Weight Management: Provide education and appropriate resources to help participant work on and attain dietary goals.;Weight Management/Obesity: Establish reasonable short term and long term weight goals.;Obesity: Provide education and appropriate resources to help participant work on and attain dietary goals.;Weight Management: Develop a combined nutrition and exercise program designed to reach desired caloric intake, while maintaining appropriate intake of nutrient and fiber, sodium and fats,  and appropriate energy expenditure required for the weight goal.    Expected Outcomes Short Term: Continue to assess and modify interventions until short term weight is achieved;Long Term: Adherence to nutrition and physical activity/exercise program aimed toward attainment of established weight goal;Understanding recommendations for meals to include 15-35% energy as protein, 25-35% energy from fat, 35-60% energy from carbohydrates, less than 200mg  of dietary cholesterol, 20-35 gm of total fiber daily;Understanding of distribution of calorie intake throughout the day with the consumption of 4-5 meals/snacks    Diabetes Yes    Intervention Provide education about signs/symptoms and action to take for hypo/hyperglycemia.;Provide education about proper nutrition, including hydration, and aerobic/resistive exercise prescription along with prescribed medications to achieve blood glucose in normal ranges: Fasting glucose 65-99 mg/dL    Expected Outcomes Short Term: Participant verbalizes understanding of the  signs/symptoms and immediate care of hyper/hypoglycemia, proper foot care and importance of medication, aerobic/resistive exercise and nutrition plan for blood glucose control.;Long Term: Attainment of HbA1C < 7%.    Heart Failure Yes    Intervention Provide a combined exercise and nutrition program that is supplemented with education, support and counseling about heart failure. Directed toward relieving symptoms such as shortness of breath, decreased exercise tolerance, and extremity edema.    Expected Outcomes Improve functional capacity of life;Short term: Attendance in program 2-3 days a week with increased exercise capacity. Reported lower sodium intake. Reported increased fruit and vegetable intake. Reports medication compliance.;Short term: Daily weights obtained and reported for increase. Utilizing diuretic protocols set by physician.;Long term: Adoption of self-care skills and reduction of barriers for early signs and symptoms recognition and intervention leading to self-care maintenance.    Hypertension Yes    Intervention Provide education on lifestyle modifcations including regular physical activity/exercise, weight management, moderate sodium restriction and increased consumption of fresh fruit, vegetables, and low fat dairy, alcohol moderation, and smoking cessation.;Monitor prescription use compliance.    Expected Outcomes Short Term: Continued assessment and intervention until BP is < 140/69mm HG in hypertensive participants. < 130/82mm HG in hypertensive participants with diabetes, heart failure or chronic kidney disease.;Long Term: Maintenance of blood pressure at goal levels.    Lipids Yes    Intervention Provide education and support for participant on nutrition & aerobic/resistive exercise along with prescribed medications to achieve LDL 70mg , HDL >40mg .    Expected Outcomes Short Term: Participant states understanding of desired cholesterol values and is compliant with medications  prescribed. Participant is following exercise prescription and nutrition guidelines.;Long Term: Cholesterol controlled with medications as prescribed, with individualized exercise RX and with personalized nutrition plan. Value goals: LDL < 70mg , HDL > 40 mg.             Core Components/Risk Factors/Patient Goals Review:   Goals and Risk Factor Review     Row Name 11/22/23 1429             Core Components/Risk Factors/Patient Goals Review   Personal Goals Review Weight Management/Obesity;Lipids;Heart Failure;Hypertension;Diabetes       Review Liora started cardiac rehab on 11/22/23. Fred did well with well with exercise. Wilson is somewhat deconditioned. Vital signs and CBg's were stable.       Expected Outcomes Amberlea will continue to participate in cardiac rehab for exercise, nutrition and lifstyle modifications                Core Components/Risk Factors/Patient Goals at Discharge (Final Review):   Goals and Risk Factor Review - 11/22/23 1429  Core Components/Risk Factors/Patient Goals Review   Personal Goals Review Weight Management/Obesity;Lipids;Heart Failure;Hypertension;Diabetes    Review Cherylanne started cardiac rehab on 11/22/23. Jadea did well with well with exercise. Nishat is somewhat deconditioned. Vital signs and CBg's were stable.    Expected Outcomes Tashayla will continue to participate in cardiac rehab for exercise, nutrition and lifstyle modifications             ITP Comments:  ITP Comments     Row Name 11/16/23 0909 11/22/23 1131         ITP Comments Dr. Wilbert Bihari medical director. Introduction to pritikin education/intensive cardiac rehab. Initial orientation packet reviewed with patient. 30 day ITP Review. Tenia started cardiac rehab on 11/22/23. Jyrah did well with exercise.               Comments: Pt started cardiac rehab today.  Pt tolerated light exercise without difficulty. VSS, telemetry-Sinus tach, Laurinda is somewhat  deconditioned. Uses a rollator. Tremors noted. Savita says that her tremors have been worse since she has been taking Amiodarone . America's amiodarone  dose has been decreased to 100 mg once a day.   Medication list reconciled. Med changes noted. Judine is supposed to start taking coreg  today. Pt denies barriers to medicaiton compliance.  PSYCHOSOCIAL ASSESSMENT:  PHQ-0. Pt exhibits positive coping skills, hopeful outlook with supportive family. No psychosocial needs identified at this time, no psychosocial interventions necessary.    Pt enjoys casino's and travel.   Pt oriented to exercise equipment and routine.    Understanding verbalized. Hadassah Elpidio Quan RN BSN

## 2023-11-24 ENCOUNTER — Encounter (HOSPITAL_COMMUNITY)
Admission: RE | Admit: 2023-11-24 | Discharge: 2023-11-24 | Disposition: A | Discharge status: Home / Self Care | Payer: Medicare HMO | Source: Ambulatory Visit | Attending: Internal Medicine | Admitting: Internal Medicine

## 2023-11-24 DIAGNOSIS — E162 Hypoglycemia, unspecified: Secondary | ICD-10-CM | POA: Diagnosis not present

## 2023-11-24 DIAGNOSIS — Z951 Presence of aortocoronary bypass graft: Secondary | ICD-10-CM

## 2023-11-24 DIAGNOSIS — Z9889 Other specified postprocedural states: Secondary | ICD-10-CM

## 2023-11-24 LAB — GLUCOSE, CAPILLARY
Glucose-Capillary: 207 mg/dL — ABNORMAL HIGH (ref 70–99)
Glucose-Capillary: 172 mg/dL — ABNORMAL HIGH (ref 70–99)

## 2023-11-27 ENCOUNTER — Other Ambulatory Visit: Payer: Self-pay | Admitting: Thoracic Surgery (Cardiothoracic Vascular Surgery)

## 2023-11-27 ENCOUNTER — Encounter (HOSPITAL_COMMUNITY)
Admission: RE | Admit: 2023-11-27 | Discharge: 2023-11-27 | Disposition: A | Discharge status: Home / Self Care | Payer: Medicare HMO | Source: Ambulatory Visit | Attending: Internal Medicine

## 2023-11-27 DIAGNOSIS — E162 Hypoglycemia, unspecified: Secondary | ICD-10-CM | POA: Diagnosis not present

## 2023-11-27 DIAGNOSIS — Z951 Presence of aortocoronary bypass graft: Secondary | ICD-10-CM

## 2023-11-27 DIAGNOSIS — Z9889 Other specified postprocedural states: Secondary | ICD-10-CM

## 2023-11-27 LAB — GLUCOSE, CAPILLARY
Glucose-Capillary: 159 mg/dL — ABNORMAL HIGH (ref 70–99)
Glucose-Capillary: 210 mg/dL — ABNORMAL HIGH (ref 70–99)

## 2023-11-28 ENCOUNTER — Ambulatory Visit (INDEPENDENT_AMBULATORY_CARE_PROVIDER_SITE_OTHER): Payer: Self-pay | Admitting: Thoracic Surgery (Cardiothoracic Vascular Surgery)

## 2023-11-28 ENCOUNTER — Ambulatory Visit
Admission: RE | Admit: 2023-11-28 | Discharge: 2023-11-28 | Disposition: A | Discharge status: Home / Self Care | Payer: Medicare HMO | Source: Ambulatory Visit | Attending: Thoracic Surgery (Cardiothoracic Vascular Surgery) | Admitting: Thoracic Surgery (Cardiothoracic Vascular Surgery)

## 2023-11-28 ENCOUNTER — Encounter: Payer: Self-pay | Admitting: Thoracic Surgery (Cardiothoracic Vascular Surgery)

## 2023-11-28 VITALS — BP 104/66 | HR 94 | Resp 18 | Wt 186.0 lb

## 2023-11-28 DIAGNOSIS — Z9889 Other specified postprocedural states: Secondary | ICD-10-CM

## 2023-11-28 DIAGNOSIS — J90 Pleural effusion, not elsewhere classified: Secondary | ICD-10-CM

## 2023-11-28 DIAGNOSIS — Z951 Presence of aortocoronary bypass graft: Secondary | ICD-10-CM

## 2023-11-28 NOTE — Progress Notes (Signed)
[address]       Diana [name] [address]             [phone]     HPI: Diana [name] returns for a scheduled follow-up visit  Diana [name] is a 78 year old woman with a history of coronary disease, hypertension, hyperlipidemia, type 2 diabetes, obesity, heart failure, pulmonary hypertension, and mitral regurgitation.  She presented with chest pain and shortness of breath.  She had severe three-vessel disease with ejection fraction of 25% and severe mitral regurgitation.  She underwent coronary bypass grafting x 4 and mitral valve repair on 09/06/2023.  She had atrial fibrillation postoperatively and went home on amiodarone and Eliquis.  Relatively slow to progress and went home on postoperative day 16.  She had a couple thoracenteses for left pleural effusions.  I last saw her in the office on 10/31/2023.  She was feeling better at that time.  In the interim since that visit she has continued to make progress.  She is working with cardiac rehab.  Exercise tolerance is gradually improving.  She had some drainage from the inferior aspect of her sternal incision.  That stopped a few days ago and then she developed an area of swelling right above that.  No fevers, chills, or sweats.  Past Medical History:  Diagnosis Date   CAD (coronary artery disease) 7/12-8/12   staged LAD/RCA DES   Diabetes mellitus (HCC)    Diastolic dysfunction 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    Dyslipidemia    HTN (hypertension)     Current Outpatient Medications  Medication Sig Dispense Refill   acetaminophen (TYLENOL) 325 MG tablet Take 2 tablets (650 mg total) by mouth every 6 (six) hours as needed for mild pain (pain score 1-3).     amiodarone (PACERONE) 100 MG tablet Take 1 tablet (100 mg total) by mouth daily. 30 tablet 3   apixaban (ELIQUIS) 5 MG TABS tablet Take 1 tablet (5 mg total) by mouth 2 (two) times daily. 60 tablet 5   aspirin EC 81 MG tablet Take 1 tablet (81 mg total) by mouth daily. Swallow  whole.     BD INSULIN SYRINGE U/F 1/2UNIT 31G X 5/16" 0.3 ML MISC      Beta Carotene (VITAMIN A) 25000 UNIT capsule Take 25,000 Units by mouth daily.     carvedilol (COREG) 3.125 MG tablet Take 1 tablet (3.125 mg total) by mouth 2 (two) times daily. 60 tablet 3   cyanocobalamin (VITAMIN B12) 1000 MCG tablet Take 2,000 mcg by mouth daily.     dapagliflozin propanediol (FARXIGA) 10 MG TABS tablet Take 1 tablet (10 mg total) by mouth daily. 30 tablet 5   ezetimibe (ZETIA) 10 MG tablet Take 1 tablet (10 mg total) by mouth daily. 90 tablet 2   fish oil-omega-3 fatty acids 1000 MG capsule Take 1 g by mouth daily.     furosemide (LASIX) 40 MG tablet Take 1.5 tablets (60 mg total) by mouth daily. 45 tablet 3   gabapentin (NEURONTIN) 100 MG capsule Take 100 mg by mouth 2 (two) times daily. Take 100 mg at  morning and bedtime     insulin NPH-regular Human (NOVOLIN 70/30) (70-30) 100 UNIT/ML injection Inject 30-60 Units into the skin 2 (two) times daily.     loratadine (CLARITIN) 10 MG tablet Take 10 mg by mouth daily.     Magnesium Hydroxide (DULCOLAX SOFT CHEWS PO) Take 600 mg by mouth daily.  Oyster Shell (OYSTER CALCIUM) 500 MG TABS tablet Take 500 mg of elemental calcium by mouth 2 (two) times a week.     OZEMPIC, 0.25 OR 0.5 MG/DOSE, 2 MG/3ML SOPN Inject 0.25 mg into the skin once a week for 28 days, THEN 0.5 mg once a week for 28 days. 3 mL 5   pantoprazole (PROTONIX) 40 MG tablet Take 1 tablet by mouth daily. Reported on 03/24/2016     sacubitril-valsartan (ENTRESTO) 24-26 MG Take 1 tablet by mouth 2 (two) times daily. 60 tablet 6   sennosides-docusate sodium (SENOKOT-S) 8.6-50 MG tablet Take 1 tablet by mouth daily.     spironolactone (ALDACTONE) 25 MG tablet Take 1 tablet (25 mg total) by mouth daily. 30 tablet 5   sulfamethoxazole-trimethoprim (BACTRIM DS) 800-160 MG tablet Take 1 tablet by mouth 2 (two) times daily. For 7 days     traMADol (ULTRAM) 50 MG tablet Take 1 tablet (50 mg total) by  mouth every 12 (twelve) hours as needed for severe pain (pain score 7-10). (Patient not taking: Reported on 11/28/2023) 20 tablet 0   No current facility-administered medications for this visit.    Physical Exam BP 104/66   Pulse 94   Resp 18   Wt 186 lb (84.4 kg)   SpO2 94% Comment: RA  BMI 31.61 kg/m  78 year old woman in no acute distress Alert and oriented x 3 with no focal deficits Lungs clear bilaterally Cardiac regular rate and rhythm Sternum with slight click inferiorly Sternal incision with 2 cm red raised area with thick exudate when incised, underlying granulation tissue seen.    Diagnostic Tests: I personally reviewed her chest x-ray images.  Unchanged from 10/31/2023.  Some malposition of the inferior sternal wires, unchanged.  Basilar atelectasis.  No significant effusion.  Impression: Diana [name] is a 78 year old woman with a history of coronary disease, hypertension, hyperlipidemia, type 2 diabetes, obesity, heart failure, pulmonary hypertension, and mitral regurgitation.    CAD with ischemic cardiomyopathy and severe mitral regurgitation-now about 2-1/2 months out from coronary bypass grafting and mitral valve repair.  Overall she looks great, but developed a new area of fluctuance at the inferior aspect of her sternal incision.  Sternal incision-fluctuant area incised and drained.  Thick exudate and extensive granulation tissue.  Debrided.  Underlying tissue appears healthy.  Appears superficial, no discernible communication to sternum.  Daughter will do wet-to-dry dressing changes twice daily.  She may drive.  She may begin to do upper body exercises with cardiac rehab.  Should start with very light weights and gradually increase over time.  Plan: Normal saline wet-to-dry dressing changes to wound Return in 1 week for wound check Call with fevers, chills, increased drainage, redness or increased pain.  [name], MD [facility] [phone]

## 2023-11-29 ENCOUNTER — Encounter (HOSPITAL_COMMUNITY)
Admission: RE | Admit: 2023-11-29 | Discharge: 2023-11-29 | Disposition: A | Discharge status: Home / Self Care | Payer: Medicare HMO | Source: Ambulatory Visit | Attending: Internal Medicine

## 2023-11-29 ENCOUNTER — Telehealth (HOSPITAL_COMMUNITY): Payer: Self-pay | Admitting: Cardiology

## 2023-11-29 DIAGNOSIS — E162 Hypoglycemia, unspecified: Secondary | ICD-10-CM | POA: Diagnosis not present

## 2023-11-29 DIAGNOSIS — Z951 Presence of aortocoronary bypass graft: Secondary | ICD-10-CM

## 2023-11-29 DIAGNOSIS — Z9889 Other specified postprocedural states: Secondary | ICD-10-CM

## 2023-11-29 DIAGNOSIS — I5043 Acute on chronic combined systolic (congestive) and diastolic (congestive) heart failure: Secondary | ICD-10-CM

## 2023-11-29 LAB — GLUCOSE, CAPILLARY
Glucose-Capillary: 58 mg/dL — ABNORMAL LOW (ref 70–99)
Glucose-Capillary: 93 mg/dL (ref 70–99)

## 2023-11-29 NOTE — Telephone Encounter (Signed)
LMOM

## 2023-11-29 NOTE — Telephone Encounter (Signed)
Diana [name] with cardiac rehab called to report symptomatic low b.p readings after exercise today B/p 94/60  92/60  96/62  Blood sugar was also low during this time Glucose gel given CBG recovered to 98  HR since starting coreg 80-90 NSR   Message to provider as Lorain Childes

## 2023-11-29 NOTE — Telephone Encounter (Signed)
Hold lasix tomorrow then restart lasix 40 mg daily   Please call.   Delton Stelle NP-C  12:11 PM

## 2023-11-29 NOTE — Progress Notes (Signed)
Patient reported feeling lightheaded after getting off the nustep. Blood pressure 94/60. CBG 58. Pre exercise CBG was 197. Patient was given glucose gel and ginger ale per hypoglycemic protocol. Onsite provider Jari Favre [facility] notified about low CBG and BP. Carlesha also drank water. Recheck sitting BP 92/60. Standing blood pressure 96/62. The heart failure clinic was notified about today's BP's. Burnice had no further complaints or symptoms upon exit from cardiac rehab. Will continue to monitor the patient throughout  the program.

## 2023-11-29 NOTE — Progress Notes (Signed)
QUALITY OF LIFE SCORE REVIEW  Pt completed Quality of Life survey as a participant in Cardiac Rehab.  Scores 21.0 or below are considered low. Overall 24.80, Health and Function 19.00, socioeconomic 29.00, physiological and spiritual 28.9, family 28.80. Patient quality of life slightly altered by physical constraints which limits ability to perform as prior to recent cardiac illness. Alizon says that her shortness of breath and energy level are improving. Celine denies being depressed. Offered emotional support and reassurance.  Will continue to monitor and intervene as necessary.  Thayer Headings RN BSN

## 2023-11-30 MED ORDER — FUROSEMIDE 40 MG PO TABS: 40.0000 mg | ORAL_TABLET | Freq: Every day | ORAL | 3 refills | Status: DC

## 2023-11-30 NOTE — Addendum Note (Signed)
Addended by: Theresia Bough on: 11/30/2023 12:37 PM   Modules accepted: Orders

## 2023-11-30 NOTE — Telephone Encounter (Signed)
Patient aware and voiced understanding  Your physician has requested that you regularly monitor and record your blood pressure readings at home. Please use the same machine at the same time of day to check your readings and record them to bring to your follow-up visit. -will notify office if SBP is consistently below 100  Aware to hold lasix 2/14, starting 2/15 will change lasix to 40 daily  Med list updated

## 2023-12-01 ENCOUNTER — Encounter (HOSPITAL_COMMUNITY)
Admission: RE | Admit: 2023-12-01 | Discharge: 2023-12-01 | Disposition: A | Discharge status: Home / Self Care | Payer: Medicare HMO | Source: Ambulatory Visit | Attending: Internal Medicine | Admitting: Internal Medicine

## 2023-12-01 ENCOUNTER — Telehealth (HOSPITAL_COMMUNITY): Payer: Self-pay

## 2023-12-01 DIAGNOSIS — Z951 Presence of aortocoronary bypass graft: Secondary | ICD-10-CM

## 2023-12-01 DIAGNOSIS — Z9889 Other specified postprocedural states: Secondary | ICD-10-CM

## 2023-12-01 LAB — GLUCOSE, CAPILLARY
Glucose-Capillary: 104 mg/dL — ABNORMAL HIGH (ref 70–99)
Glucose-Capillary: 112 mg/dL — ABNORMAL HIGH (ref 70–99)

## 2023-12-01 NOTE — Progress Notes (Signed)
Incomplete Cardiac Rehab Session Note  Patient Details  Name: MAXI CARRERAS MRN: [id number] Date of Birth: 02-06-1946 Referring Provider:   Flowsheet Row INTENSIVE CARDIAC REHAB ORIENT from 11/16/2023 in [facility]  Referring Provider Arvilla Meres, MD       Colen Darling did not complete her rehab session.  Pt's blood sugar pre-exercise was 108 (on pt's glucometer) and 114 on ours. 2 days ago pt's blood sugar pre-exercise was 197, dropped to 58. RN advised pt's BS needed to be around 140 to exercise. RN trended pt's blood sugars and pt usually drops by 40-50mg /dL. Pt had 2 packs of graham crackers and 4oz juice.   Pt stated her fasting BS this AM was 88, ate breakfast (cereal, low fat milk). 30 minutes post breakfast, BS 148, according to her sliding scale administered 35u of 70/30. On way to class, pt has some protein shake and couple bites of protein bar.   ~46min post snack, rechecked BS 104. Informed pt she could not exercise today due to policy.   RN will reach out to her endocrinologist for recommendations.

## 2023-12-01 NOTE — Telephone Encounter (Signed)
Called pt's endocrinologist about low blood sugars. Message taken for nurse. Awaiting call back for recommendations for pt.

## 2023-12-04 ENCOUNTER — Encounter (HOSPITAL_COMMUNITY): Admission: RE | Admit: 2023-12-04 | Payer: Medicare HMO | Source: Ambulatory Visit

## 2023-12-05 NOTE — Telephone Encounter (Signed)
 Left message to call cardiac rehab. Called to inquire about attending exercise on Wednesday 12/05/23. We will be closing at noon tomorrow due to  pending inclement weather.Thayer Headings RN BSN

## 2023-12-06 ENCOUNTER — Ambulatory Visit: Payer: Self-pay | Admitting: Thoracic Surgery (Cardiothoracic Vascular Surgery)

## 2023-12-06 ENCOUNTER — Encounter (HOSPITAL_COMMUNITY)
Admission: RE | Admit: 2023-12-06 | Discharge: 2023-12-06 | Disposition: A | Discharge status: Home / Self Care | Payer: Medicare HMO | Source: Ambulatory Visit | Attending: Internal Medicine | Admitting: Internal Medicine

## 2023-12-06 ENCOUNTER — Ambulatory Visit (INDEPENDENT_AMBULATORY_CARE_PROVIDER_SITE_OTHER): Payer: Self-pay | Admitting: Surgical

## 2023-12-06 VITALS — BP 100/58 | HR 90 | Resp 18

## 2023-12-06 DIAGNOSIS — Z9889 Other specified postprocedural states: Secondary | ICD-10-CM

## 2023-12-06 DIAGNOSIS — Z5189 Encounter for other specified aftercare: Secondary | ICD-10-CM

## 2023-12-06 DIAGNOSIS — Z951 Presence of aortocoronary bypass graft: Secondary | ICD-10-CM

## 2023-12-06 DIAGNOSIS — E162 Hypoglycemia, unspecified: Secondary | ICD-10-CM | POA: Diagnosis not present

## 2023-12-06 NOTE — Patient Instructions (Signed)
 Continue to keep dressing on top of sternal incision and region previously packing.

## 2023-12-06 NOTE — Progress Notes (Signed)
 [address]       [place] [address]             [phone]      Diana [name] [facility] Medical Record #[id number] Date of Birth: 14-Oct-1946  Referring: Lennette Bihari, MD Primary Care: [name], NP Primary Cardiologist: Nicki Guadalajara, MD   Chief Complaint:   POST OP FOLLOW UP  Operative Report    DATE OF PROCEDURE: 09/06/2023   PREOPERATIVE DIAGNOSES: 1.  Severe three-vessel coronary artery disease. 2.  Severe mitral regurgitation.   POSTOPERATIVE DIAGNOSES: 1.  Severe three-vessel coronary artery disease. 2.  Severe mitral regurgitation.   PROCEDURE: 1.  Median sternotomy, extracorporeal circulation, coronary artery bypass grafting x4 (left internal mammary artery to LAD, saphenous vein graft to first diagonal, saphenous vein graft to obtuse marginal, saphenous vein to acute marginal).   2.  Endoscopic vein harvest right leg.   3.  Mitral valve repair with [place] annuloplasty ring 30 mm (model number [id number], serial number [id number]).   SURGEON:  Salvatore Decent. Dorris Fetch, MD   ASSISTANT:  Doree Fudge, PA   History of Present Illness:    78 year old female status post above described procedure with complex postoperative course.  Most recently she has had some drainage from her sternal incision requiring a small I&D with wound packing by Dr. Dorris Fetch.  Her daughter has been assisting her with this.  Otherwise she has continued to make progress in her recovery.  She is seen by the advanced heart failure team for cardiology medical management.      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     amiodarone (PACERONE) 100 MG tablet Take 1 tablet (100 mg total) by mouth daily. 30 tablet 3   apixaban (ELIQUIS) 5 MG TABS tablet Take 1 tablet (5 mg total) by mouth 2 (two) times daily. 60 tablet 5   aspirin EC 81 MG tablet Take 1 tablet (81 mg total) by mouth daily. Swallow whole.     BD INSULIN SYRINGE U/F 1/2UNIT 31G X 5/16" 0.3 ML MISC      Beta Carotene (VITAMIN A) 25000 UNIT capsule Take 25,000 Units by mouth daily.     carvedilol (COREG) 3.125 MG tablet Take 1 tablet (3.125 mg total) by mouth 2 (two) times daily. 60 tablet 3   cyanocobalamin (VITAMIN B12) 1000 MCG tablet Take 2,000 mcg by mouth daily.     dapagliflozin propanediol (FARXIGA) 10 MG TABS tablet Take 1 tablet (10 mg total) by mouth daily. 30 tablet 5   ezetimibe (ZETIA) 10 MG tablet Take 1 tablet (10 mg total) by mouth daily. 90 tablet 2   fish oil-omega-3 fatty acids  1000 MG capsule Take 1 g by mouth daily.     furosemide (LASIX) 40 MG tablet Take 1 tablet (40 mg total) by mouth daily. 30 tablet 3   gabapentin (NEURONTIN) 100 MG capsule Take 100 mg by mouth 2 (two) times daily. Take 100 mg at  morning and bedtime     insulin NPH-regular Human (NOVOLIN 70/30) (70-30) 100 UNIT/ML injection Inject 30-60 Units into the skin 2 (two) times daily.     loratadine (CLARITIN) 10 MG tablet Take 10 mg by mouth daily.     Magnesium Hydroxide (DULCOLAX SOFT CHEWS PO) Take 600 mg by mouth daily.     Oyster Shell (OYSTER CALCIUM) 500 MG TABS tablet Take 500 mg of elemental calcium by mouth 2 (two) times a week.     pantoprazole (PROTONIX) 40 MG tablet Take 1 tablet by mouth daily. Reported on  03/24/2016     sacubitril-valsartan (ENTRESTO) 24-26 MG Take 1 tablet by mouth 2 (two) times daily. 60 tablet 6   sennosides-docusate sodium (SENOKOT-S) 8.6-50 MG tablet Take 1 tablet by mouth daily.     spironolactone (ALDACTONE) 25 MG tablet Take 1 tablet (25 mg total) by mouth daily. 30 tablet 5   sulfamethoxazole-trimethoprim (BACTRIM DS) 800-160 MG tablet Take 1 tablet by mouth 2 (two) times daily. For 7 days     traMADol (ULTRAM) 50 MG tablet Take 1 tablet (50 mg total) by mouth every 12 (twelve) hours as needed for severe pain (pain score 7-10). 20 tablet 0   No current facility-administered medications for this visit.       Physical Exam: BP (!) 100/58 (BP Location: Left Arm)   Pulse 90   Resp 18   SpO2 95%   General appearance: alert, cooperative, and no distress Heart: regular rate and rhythm Lungs: Mildly diminished left base Wound: The wound has improved significantly per patient report currently has a tiny scab which does not probe to any depth.  There is no purulence or erythema.   Diagnostic Studies & Laboratory data:     Recent Radiology Findings:   No results found.    Recent Lab Findings: Lab Results  Component Value Date   WBC 14.1 (H) 10/24/2023   HGB 10.3 (L) 10/24/2023   HCT 34.3 (L) 10/24/2023   PLT 807 (H) 10/24/2023   GLUCOSE 154 (H) 11/21/2023   CHOL 129 07/26/2023   TRIG 114 07/26/2023   HDL 37 (L) 07/26/2023   LDLCALC 71 07/26/2023   ALT 35 09/06/2023   AST 24 09/06/2023   NA 137 11/21/2023   K 4.5 11/21/2023   CL 97 (L) 11/21/2023   CREATININE 1.11 (H) 11/21/2023   BUN 26 (H) 11/21/2023   CO2 29 11/21/2023   TSH 2.280 07/26/2023   INR 1.5 (H) 09/06/2023   HGBA1C 7.8 (H) 08/29/2023      Assessment / Plan: Sternal wound continues to heal well.  There is currently no more packing required as it is too small and superficial.  I did instruct him to kind of keep it covered with daily sterile dressing.  She is concerned that she does feel  some mobility in the sternal wires in the lower portion but is tolerating it reasonably well.  Certainly in a patient with such complex medical issues and long course and recovery it would be best to avoid any further procedures if possible.  Currently everything looks acceptable for watchful waiting.  Will see the patient again on a as needed basis for any  surgically related needs or at request.      Medication Changes: No orders of the defined types were placed in this encounter.     Rowe Clack, PA-C  12/06/2023 12:50 PM

## 2023-12-06 NOTE — Progress Notes (Signed)
 Post exercise BP 90/59 sitting. Standing BP 80/50. Patient was given water. Recheck BP 99/62 sitting. Asymptomatic. Weight today 84.5 kg which is unchanged from orientation day.  Medications reviewed. Taking as prescribed. The heart failure clinic was called and notified about today's BP's spoke with [name]. Diana [name] left cardiac rehab without complaints. Will continue to monitor the patient throughout  the program.Zniya Cottone Mila Palmer RN BSN

## 2023-12-07 ENCOUNTER — Telehealth (HOSPITAL_COMMUNITY): Payer: Self-pay | Admitting: Cardiology

## 2023-12-07 NOTE — Telephone Encounter (Signed)
 Pt aware  Reports she will continue current medications and continue to monitor

## 2023-12-07 NOTE — Telephone Encounter (Signed)
-----   Message from Alen Bleacher sent at 12/07/2023  8:33 AM EST ----- Can change spiro to night time. As long as she is asymptomatic this its ok to just monitor. ----- Message ----- From: Theresia Bough, CMA Sent: 12/06/2023   1:15 PM EST To: Jacklynn Ganong, FNP; Alen Bleacher, NP  Please see the message from cardiac rehab visit and advise if needed. Thanks ----- Message ----- From: Cammy Copa, RN Sent: 12/06/2023  11:59 AM EST To: Theresia Bough, CMA  Good morning thanks for you assistance. Constance has low BP post exercise she is asymptomatic. Standing systolic 80/50 recheck 99/62 via auto BP. Would you please have on of the providers review and see if her medications need to be adjusted?  Thanks  [facility]  Cardiac Rehab

## 2023-12-08 ENCOUNTER — Encounter (HOSPITAL_COMMUNITY)
Admission: RE | Admit: 2023-12-08 | Discharge: 2023-12-08 | Disposition: A | Discharge status: Home / Self Care | Payer: Medicare HMO | Source: Ambulatory Visit | Attending: Internal Medicine | Admitting: Internal Medicine

## 2023-12-08 DIAGNOSIS — Z951 Presence of aortocoronary bypass graft: Secondary | ICD-10-CM

## 2023-12-08 DIAGNOSIS — E162 Hypoglycemia, unspecified: Secondary | ICD-10-CM | POA: Diagnosis not present

## 2023-12-08 DIAGNOSIS — Z9889 Other specified postprocedural states: Secondary | ICD-10-CM

## 2023-12-11 ENCOUNTER — Encounter (HOSPITAL_COMMUNITY)
Admission: RE | Admit: 2023-12-11 | Discharge: 2023-12-11 | Disposition: A | Discharge status: Home / Self Care | Payer: Medicare HMO | Source: Ambulatory Visit | Attending: Internal Medicine | Admitting: Internal Medicine

## 2023-12-11 DIAGNOSIS — E162 Hypoglycemia, unspecified: Secondary | ICD-10-CM

## 2023-12-11 DIAGNOSIS — Z9889 Other specified postprocedural states: Secondary | ICD-10-CM

## 2023-12-11 DIAGNOSIS — Z951 Presence of aortocoronary bypass graft: Secondary | ICD-10-CM

## 2023-12-13 ENCOUNTER — Encounter (HOSPITAL_COMMUNITY)
Admission: RE | Admit: 2023-12-13 | Discharge: 2023-12-13 | Disposition: A | Discharge status: Home / Self Care | Payer: Medicare HMO | Source: Ambulatory Visit | Attending: Internal Medicine | Admitting: Internal Medicine

## 2023-12-13 DIAGNOSIS — Z951 Presence of aortocoronary bypass graft: Secondary | ICD-10-CM

## 2023-12-13 DIAGNOSIS — E162 Hypoglycemia, unspecified: Secondary | ICD-10-CM | POA: Diagnosis not present

## 2023-12-13 DIAGNOSIS — Z9889 Other specified postprocedural states: Secondary | ICD-10-CM

## 2023-12-15 ENCOUNTER — Encounter (HOSPITAL_COMMUNITY): Payer: Self-pay | Admitting: Cardiology

## 2023-12-15 ENCOUNTER — Encounter (HOSPITAL_COMMUNITY)
Admission: RE | Admit: 2023-12-15 | Discharge: 2023-12-15 | Disposition: A | Discharge status: Home / Self Care | Payer: Medicare HMO | Source: Ambulatory Visit | Attending: Internal Medicine | Admitting: Internal Medicine

## 2023-12-15 DIAGNOSIS — Z951 Presence of aortocoronary bypass graft: Secondary | ICD-10-CM

## 2023-12-15 DIAGNOSIS — E162 Hypoglycemia, unspecified: Secondary | ICD-10-CM | POA: Diagnosis not present

## 2023-12-15 DIAGNOSIS — Z9889 Other specified postprocedural states: Secondary | ICD-10-CM

## 2023-12-18 ENCOUNTER — Telehealth (HOSPITAL_COMMUNITY): Payer: Self-pay | Admitting: *Deleted

## 2023-12-18 ENCOUNTER — Encounter (HOSPITAL_COMMUNITY): Admission: RE | Admit: 2023-12-18 | Payer: Medicare HMO | Source: Ambulatory Visit

## 2023-12-18 NOTE — Telephone Encounter (Signed)
 Spoke with Yola's daughter Herbert Seta. Shayli has a fever and vomiting on this past Friday. Emili has an appointment with her primary care provider today. Will cancel her appointments for the rest of the week. Asked that patient be symptom free for 48 hours before returning to group exercise.Thayer Headings RN BSN

## 2023-12-19 NOTE — Progress Notes (Signed)
 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 Swallow whole., Disp: , Rfl:    BD INSULIN SYRINGE U/F 1/2UNIT 31G X 5/16" 0.3 ML MISC, , Disp: , Rfl:    Beta Carotene (VITAMIN A) 25000 UNIT capsule, Take 25,000 Units by mouth daily., Disp: , Rfl:    carvedilol (COREG) 3.125 MG tablet, Take 1 tablet (3.125 mg total) by mouth 2 (two) times daily., Disp: 60 tablet, Rfl: 3   cyanocobalamin (VITAMIN B12) 1000 MCG tablet, Take 2,000 mcg by mouth daily., Disp: , Rfl:    dapagliflozin propanediol (FARXIGA) 10 MG TABS tablet, Take 1 tablet (10 mg total) by mouth daily., Disp: 30 tablet, Rfl: 5   ezetimibe (ZETIA) 10 MG tablet, Take 1 tablet (10 mg total) by mouth daily., Disp: 90 tablet, Rfl: 2   fish oil-omega-3 fatty acids 1000 MG capsule, Take 1 g by mouth daily., Disp: , Rfl:    furosemide (LASIX) 40 MG tablet, Take 1 tablet (40 mg total) by mouth  daily., Disp: 30 tablet, Rfl: 3   gabapentin (NEURONTIN) 100 MG capsule, Take 100 mg by mouth 2 (two) times daily. Take 100 mg at  morning and bedtime, Disp: , Rfl:    insulin NPH-regular Human (NOVOLIN 70/30) (70-30) 100 UNIT/ML injection, Inject 30-60 Units into the skin 2 (two) times daily., Disp: , Rfl:    loratadine (CLARITIN) 10 MG tablet, Take 10 mg by mouth daily., Disp: , Rfl:    Magnesium Hydroxide (DULCOLAX SOFT CHEWS PO), Take 600 mg by mouth daily., Disp: , Rfl:    Oyster Shell (OYSTER CALCIUM) 500 MG TABS tablet, Take 500 mg of elemental calcium by mouth 2 (two) times a week., Disp: , Rfl:    pantoprazole (PROTONIX) 40 MG tablet, Take 1 tablet by mouth daily. Reported on 03/24/2016, Disp: , Rfl:    sacubitril-valsartan (ENTRESTO) 24-26 MG, Take 1 tablet by mouth 2 (two) times daily., Disp: 60 tablet, Rfl: 6   sennosides-docusate sodium (SENOKOT-S) 8.6-50 MG tablet, Take 1 tablet by mouth daily., Disp: , Rfl:    spironolactone (ALDACTONE) 25 MG tablet, Take 1 tablet (25 mg total) by mouth daily., Disp: 30 tablet, Rfl: 5   sulfamethoxazole-trimethoprim (BACTRIM DS) 800-160 MG tablet, Take 1 tablet by mouth 2 (two) times daily. For 7 days, Disp: , Rfl:    traMADol (ULTRAM) 50 MG tablet, Take  1 tablet (50 mg total) by mouth every 12 (twelve) hours as needed for severe pain (pain score 7-10)., Disp: 20 tablet, Rfl: 0  Past 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    Dyslipidemia    HTN (hypertension)     Tobacco Use: Social History   Tobacco Use  Smoking Status Never   Passive exposure: Never  Smokeless Tobacco Never    Labs: Review Flowsheet  More data exists      Latest Ref Rng & Units 09/17/2023 09/18/2023 09/19/2023 09/20/2023 09/21/2023  Labs for ITP Cardiac and Pulmonary Rehab  O2 Saturation % 54.6  59.6  78.6  54.5  56.7      Capillary Blood Glucose: Lab Results  Component Value Date   GLUCAP 104 (H) 12/01/2023   GLUCAP 112 (H) 12/01/2023   GLUCAP 93 11/29/2023   GLUCAP 58 (L) 11/29/2023   GLUCAP 210 (H) 11/27/2023     Exercise Target Goals: Exercise Program Goal: Individual exercise prescription set using results from initial 6 min walk test and THRR while considering  patient's activity barriers and safety.   Exercise Prescription Goal: Initial exercise prescription builds to 30-45 minutes a day of aerobic activity, 2-3 days per week.  Home exercise guidelines will be given to patient during program as part of exercise prescription that the participant will acknowledge.  Activity Barriers & Risk Stratification:  Activity Barriers & Cardiac Risk Stratification - 11/16/23 1132       Activity Barriers & Cardiac Risk Stratification   Activity Barriers Arthritis;Deconditioning;Shortness of Breath;Decreased Ventricular Function;Balance Concerns;Assistive Device    Cardiac Risk Stratification High   <5 METs on            6 Minute Walk:  6 Minute Walk     Row Name 11/16/23 1131         6 Minute Walk   Phase Initial     Distance 720 feet     Walk Time 6 minutes     # of Rest Breaks 0     MPH 1.36     METS 1.38     RPE 9     Perceived Dyspnea  1     VO2 Peak 4.84     Symptoms Yes (comment)     Comments a little winded- resolved with rest. Used Rollator.     Resting HR 97 bpm     Resting BP 106/60     Resting Oxygen Saturation  94 %     Exercise Oxygen Saturation  during 6 min walk 94 %     Max Ex. HR 118 bpm     Max Ex. BP 118/70     2 Minute Post BP 110/68              Oxygen Initial Assessment:   Oxygen Re-Evaluation:   Oxygen Discharge (Final Oxygen Re-Evaluation):   Initial Exercise Prescription:  Initial Exercise Prescription - 11/16/23 1100       Date of Initial Exercise RX and Referring Provider   Date 11/16/23    Referring Provider Arvilla Meres, MD    Expected Discharge Date 02/10/24      NuStep   Level 1    SPM 60    Minutes 15    METs 1.6      Prescription Details  Frequency (times per week) 3    Duration Progress to 30 minutes of continuous aerobic without signs/symptoms of physical distress      Intensity   THRR 40-80% of Max Heartrate 58-118    Ratings of Perceived Exertion 11-13    Perceived Dyspnea 0-4      Progression   Progression Continue progressive overload as per policy without signs/symptoms or physical distress.      Resistance Training   Training Prescription Yes    Weight 2    Reps 10-15             Perform Capillary Blood Glucose checks as needed.  Exercise Prescription Changes:   Exercise Prescription Changes     Row Name 11/22/23 1028 12/08/23 1027 12/15/23 1033         Response to Exercise   Blood Pressure (Admit) 110/58 104/56 124/74     Blood Pressure (Exercise) 108/60 106/54 102/58     Blood Pressure (Exit) 108/60 96/58 93/59      Heart Rate (Admit) 104 bpm 90 bpm 93 bpm     Heart Rate (Exercise) 107 bpm 104 bpm 106 bpm     Heart Rate (Exit) 100 bpm 93 bpm 94 bpm     Rating of Perceived Exertion (Exercise) 11 10 12      Symptoms None None None     Comments Off to a good start with exercise. -- --     Duration Progress to 30 minutes of  aerobic without signs/symptoms of physical distress Progress to 30 minutes of  aerobic without signs/symptoms of physical distress Progress to 30 minutes of  aerobic without signs/symptoms of physical distress     Intensity THRR unchanged THRR unchanged THRR unchanged       Progression   Progression Continue to progress workloads to maintain intensity without signs/symptoms of physical distress. Continue to progress workloads to maintain intensity without signs/symptoms of physical distress. Continue to progress workloads to maintain intensity without signs/symptoms of physical distress.     Average METs 1.6 2.2 1.8       Resistance  Training   Training Prescription No Yes Yes     Weight Relaxation day, no weights. 2 lbs 2 lbs     Reps -- 10-15 10-15     Time -- 10 Minutes 10 Minutes       Interval Training   Interval Training No No No       NuStep   Level 1 1 1      SPM 64 -- 106     Minutes 25 25 25      METs 1.6 2.2 1.8              Exercise Comments:   Exercise Comments     Row Name 11/22/23 1132           Exercise Comments Nikola tolerated low intensity exercise well without symptoms.                Exercise Goals and Review:   Exercise Goals     Row Name 11/16/23 1133             Exercise Goals   Increase Physical Activity Yes       Intervention Develop an individualized exercise prescription for aerobic and resistive training based on initial evaluation findings, risk stratification, comorbidities and participant's personal goals.;Provide advice, education, support and counseling about physical activity/exercise needs.       Expected Outcomes Short Term: Attend rehab on a regular  basis to increase amount of physical activity.;Long Term: Exercising regularly at least 3-5 days a week.;Long Term: Add in home exercise to make exercise part of routine and to increase amount of physical activity.       Increase Strength and Stamina Yes       Intervention Provide advice, education, support and counseling about physical activity/exercise needs.;Develop an individualized exercise prescription for aerobic and resistive training based on initial evaluation findings, risk stratification, comorbidities and participant's personal goals.       Expected Outcomes Short Term: Increase workloads from initial exercise prescription for resistance, speed, and METs.;Short Term: Perform resistance training exercises routinely during rehab and add in resistance training at home;Long Term: Improve cardiorespiratory fitness, muscular endurance and strength as measured by increased METs and functional capacity  ( )       Able to understand and use rate of perceived exertion (RPE) scale Yes       Intervention Provide education and explanation on how to use RPE scale       Expected Outcomes Short Term: Able to use RPE daily in rehab to express subjective intensity level;Long Term:  Able to use RPE to guide intensity level when exercising independently       Knowledge and understanding of Target Heart Rate Range (THRR) Yes       Intervention Provide education and explanation of THRR including how the numbers were predicted and where they are located for reference       Expected Outcomes Short Term: Able to state/look up THRR;Short Term: Able to use daily as guideline for intensity in rehab;Long Term: Able to use THRR to govern intensity when exercising independently       Understanding of Exercise Prescription Yes       Intervention Provide education, explanation, and written materials on patient's individual exercise prescription       Expected Outcomes Short Term: Able to explain program exercise prescription;Long Term: Able to explain home exercise prescription to exercise independently                Exercise Goals Re-Evaluation :  Exercise Goals Re-Evaluation     Row Name 11/22/23 1132 12/19/23 0744           Exercise Goal Re-Evaluation   Exercise Goals Review Increase Physical Activity;Increase Strength and Stamina;Able to understand and use rate of perceived exertion (RPE) scale Increase Physical Activity;Increase Strength and Stamina;Able to understand and use rate of perceived exertion (RPE) scale      Comments Patient able to understand and use RPE scale appropriately. Patient is out sick. Unable to review goals at this time. She is making gradual progress with exericse.      Expected Outcomes Progress duration to achieve 30 minutes of exercise. Review goal upon return to cardiac rehab.               Discharge Exercise Prescription (Final Exercise Prescription Changes):   Exercise Prescription Changes - 12/15/23 1033       Response to Exercise   Blood Pressure (Admit) 124/74    Blood Pressure (Exercise) 102/58    Blood Pressure (Exit) 93/59    Heart Rate (Admit) 93 bpm    Heart Rate (Exercise) 106 bpm    Heart Rate (Exit) 94 bpm    Rating of Perceived Exertion (Exercise) 12    Symptoms None    Duration Progress to 30 minutes of  aerobic without signs/symptoms of physical distress    Intensity THRR unchanged  Progression   Progression Continue to progress workloads to maintain intensity without signs/symptoms of physical distress.    Average METs 1.8      Resistance Training   Training Prescription Yes    Weight 2 lbs    Reps 10-15    Time 10 Minutes      Interval Training   Interval Training No      NuStep   Level 1    SPM 106    Minutes 25    METs 1.8             Nutrition:  Target Goals: Understanding of nutrition guidelines, daily intake of sodium 1500mg , cholesterol 200mg , calories 30% from fat and 7% or less from saturated fats, daily to have 5 or more servings of fruits and vegetables.  Biometrics:  Pre Biometrics - 11/16/23 1130       Pre Biometrics   Waist Circumference 46 inches    Hip Circumference 46 inches    Waist to Hip Ratio 1 %    Triceps Skinfold 37 mm    % Body Fat 47.3 %    Grip Strength 20 kg    Flexibility 0 in   not done, low back pain   Single Leg Stand 1.87 seconds              Nutrition Therapy Plan and Nutrition Goals:  Nutrition Therapy & Goals - 12/18/23 1022       Nutrition Therapy   Diet Heart Healthy/Carbohydrate Consistent Diet    Drug/Food Interactions --      Personal Nutrition Goals   Nutrition Goal Patient to identify strategies for reducing cardiovascular risk by attending the [facility] education and nutrition series weekly.   goal in progress.   Personal Goal #2 Patient to improve diet quality by using the plate method as a guide for meal planning to include lean  protein/plant protein, fruits, vegetables, whole grains, nonfat dairy as part of a well-balanced diet.   goal in progress.   Comments Goals in progress. Moet has medical history of DM2, hyperlipidemia, CABGx4, chronic systolic heart failure, HTN, s/p mitral valve repair. She continues to attend the [facility] education/nutrition series regularly. A1c and LDL are at goal. She has maintained her weight since starting with our program. She continues regular follow-up with endocrinology and advanced heart failure clinic. Patient will benefit from participation in intensive cardiac rehab for nutrition, exercise, and lifestyle modification.      Intervention Plan   Intervention Prescribe, educate and counsel regarding individualized specific dietary modifications aiming towards targeted core components such as weight, hypertension, lipid management, diabetes, heart failure and other comorbidities.;Nutrition handout(s) given to patient.    Expected Outcomes Short Term Goal: Understand basic principles of dietary content, such as calories, fat, sodium, cholesterol and nutrients.;Long Term Goal: Adherence to prescribed nutrition plan.             Nutrition Assessments:  MEDIFICTS Score Key: >=70 Need to make dietary changes  40-70 Heart Healthy Diet <= 40 Therapeutic Level Cholesterol Diet    Picture Your Plate Scores: <16 Unhealthy dietary pattern with much room for improvement. 41-50 Dietary pattern unlikely to meet recommendations for good health and room for improvement. 51-60 More healthful dietary pattern, with some room for improvement.  >60 Healthy dietary pattern, although there may be some specific behaviors that could be improved.    Nutrition Goals Re-Evaluation:  Nutrition Goals Re-Evaluation     Row Name 12/18/23 1022  Goals   Current Weight 186 lb 4.6 oz (84.5 kg)       Comment Cr 1.11, GFR 52, A1c 6.9, LDL 71, HDL 37, Lpa WNL       Expected Outcome Goals in  progress. Smera has medical history of DM2, hyperlipidemia, CABGx4, chronic systolic heart failure, HTN, s/p mitral valve repair. She continues to attend the [facility] education/nutrition series regularly. A1c and LDL are at goal. She has maintained her weight since starting with our program. She continues regular follow-up with endocrinology and advanced heart failure clinic. Patient will benefit from participation in intensive cardiac rehab for nutrition, exercise, and lifestyle modification.                Nutrition Goals Re-Evaluation:  Nutrition Goals Re-Evaluation     Row Name 12/18/23 1022             Goals   Current Weight 186 lb 4.6 oz (84.5 kg)       Comment Cr 1.11, GFR 52, A1c 6.9, LDL 71, HDL 37, Lpa WNL       Expected Outcome Goals in progress. Brianda has medical history of DM2, hyperlipidemia, CABGx4, chronic systolic heart failure, HTN, s/p mitral valve repair. She continues to attend the [facility] education/nutrition series regularly. A1c and LDL are at goal. She has maintained her weight since starting with our program. She continues regular follow-up with endocrinology and advanced heart failure clinic. Patient will benefit from participation in intensive cardiac rehab for nutrition, exercise, and lifestyle modification.                Nutrition Goals Discharge (Final Nutrition Goals Re-Evaluation):  Nutrition Goals Re-Evaluation - 12/18/23 1022       Goals   Current Weight 186 lb 4.6 oz (84.5 kg)    Comment Cr 1.11, GFR 52, A1c 6.9, LDL 71, HDL 37, Lpa WNL    Expected Outcome Goals in progress. Lowell has medical history of DM2, hyperlipidemia, CABGx4, chronic systolic heart failure, HTN, s/p mitral valve repair. She continues to attend the [facility] education/nutrition series regularly. A1c and LDL are at goal. She has maintained her weight since starting with our program. She continues regular follow-up with endocrinology and advanced heart failure clinic.  Patient will benefit from participation in intensive cardiac rehab for nutrition, exercise, and lifestyle modification.             Psychosocial: Target Goals: Acknowledge presence or absence of significant depression and/or stress, maximize coping skills, provide positive support system. Participant is able to verbalize types and ability to use techniques and skills needed for reducing stress and depression.  Initial Review & Psychosocial Screening:  Initial Psych Review & Screening - 11/16/23 1134       Initial Review   Current issues with None Identified      Family Dynamics   Good Support System? Yes   Shronda has her husband and children for support   Comments Charna denies any depression/ anxiety/ stress. She is still very fatigued with ADL's and shared that she is frustrated with how slow progress has been. She is ready to return to her baseline.      Barriers   Psychosocial barriers to participate in program There are no identifiable barriers or psychosocial needs.      Screening Interventions   Interventions Encouraged to exercise;Provide feedback about the scores to participant    Expected Outcomes Short Term goal: Identification and review with participant of any Quality of Life or Depression  concerns found by scoring the questionnaire.;Long Term goal: The participant improves quality of Life and PHQ9 Scores as seen by post scores and/or verbalization of changes             Quality of Life Scores:  Quality of Life - 11/16/23 1137       Quality of Life   Select Quality of Life      Quality of Life Scores   Health/Function Pre 19 %    Socioeconomic Pre 29 %    Psych/Spiritual Pre 28.29 %    Family Pre 28.8 %    GLOBAL Pre 24.8 %            Scores of 19 and below usually indicate a poorer quality of life in these areas.  A difference of  2-3 points is a clinically meaningful difference.  A difference of 2-3 points in the total score of the Quality of Life  Index has been associated with significant improvement in overall quality of life, self-image, physical symptoms, and general health in studies assessing change in quality of life.  PHQ-9: Review Flowsheet       11/16/2023 03/24/2016  Depression screen PHQ 2/9  Decreased Interest 0 0  Down, Depressed, Hopeless 0 0  PHQ - 2 Score 0 0  Altered sleeping 0 -  Tired, decreased energy 0 -  Change in appetite 0 -  Feeling bad or failure about yourself  0 -  Trouble concentrating 0 -  Moving slowly or fidgety/restless 0 -  Suicidal thoughts 0 -  PHQ-9 Score 0 -  Difficult doing work/chores Not difficult at all -   Interpretation of Total Score  Total Score Depression Severity:  1-4 = Minimal depression, 5-9 = Mild depression, 10-14 = Moderate depression, 15-19 = Moderately severe depression, 20-27 = Severe depression   Psychosocial Evaluation and Intervention:   Psychosocial Re-Evaluation:  Psychosocial Re-Evaluation     Row Name 11/22/23 1132 12/18/23 1707           Psychosocial Re-Evaluation   Current issues with None Identified None Identified      Comments Shree did not voice any increased concerns or stressors on her first day of exercise. Shabria has returned to work . Valetta has not voiced any increased concerns or stressors during exercise.      Continue Psychosocial Services  Follow up required by staff Follow up required by staff               Psychosocial Discharge (Final Psychosocial Re-Evaluation):  Psychosocial Re-Evaluation - 12/18/23 1707       Psychosocial Re-Evaluation   Current issues with None Identified    Comments Teegan has returned to work . Dawna has not voiced any increased concerns or stressors during exercise.    Continue Psychosocial Services  Follow up required by staff             Vocational Rehabilitation: Provide vocational rehab assistance to qualifying candidates.   Vocational Rehab Evaluation & Intervention:  Vocational Rehab  - 11/16/23 1138       Initial Vocational Rehab Evaluation & Intervention   Assessment shows need for Vocational Rehabilitation No   Joana is working            Education: Education Goals: Education classes will be provided on a weekly basis, covering required topics. Participant will state understanding/return demonstration of topics presented.    Education     Row Name 11/24/23 1100     Education  Cardiac Education Topics [facility]   Select Core Videos     Core Videos   Educator Exercise Industrial/product designer Education   General Education Heart Disease Risk Reduction   Instruction Review Code 1- Verbalizes Understanding   Class Start Time 1145   Class Stop Time 1230   Class Time Calculation (min) 45 min    Row Name 11/27/23 1100     Education   Cardiac Education Topics [facility]   Select Workshops     Workshops   Educator Exercise Physiologist   Select Exercise   Exercise Workshop Location manager and Fall Prevention   Instruction Review Code 1- Verbalizes Understanding   Class Start Time 1142   Class Stop Time 1224   Class Time Calculation (min) 42 min    Row Name 11/29/23 1000     Education   Cardiac Education Topics [facility]   Customer service manager   Weekly Topic Adding Flavor - Sodium-Free   Instruction Review Code 1- Verbalizes Understanding   Class Start Time 1145   Class Stop Time 1225   Class Time Calculation (min) 40 min    Row Name 12/08/23 1300     Education   Cardiac Education Topics [facility]in   Licensed conveyancer Nutrition   Nutrition Other  Label Reading   Instruction Review Code 1- Verbalizes Understanding   Class Start Time 1152   Class Stop Time 1233   Class Time Calculation (min) 41 min    Row Name 12/11/23 1100     Education   Cardiac Education Topics [facility]in   Actor Nurse    Select Nutrition   Nutrition Becoming a [facility]rit[facility]in Chef   Instruction Review Code 1- Verbalizes Understanding   Class Start Time 1150   Class Stop Time 1226   Class Time Calculation (min) 36 min    Row Name 12/13/23 1300     Education   Cardiac Education Topics [facility]   Secondary school teacher School   Educator Dietitian;Nurse   Weekly Topic Personalizing Your [facility]ritikin Plate   Instruction Review Code 1- Verbalizes Understanding   Class Start Time 1149   Class Stop Time 1233   Class Time Calculation (min) 44 min    Row Name 12/15/23 1400     Education   Cardiac Education Topics [facility]in   Glass blower/designer Nutrition   Nutrition Workshop Label Reading   Instruction Review Code 1- Tax inspector   Class Start Time 1145   Class Stop Time 1230   Class Time Calculation (min) 45 min            Core Videos: Exercise    Move It!  Clinical staff conducted group or individual video education with verbal and written material and guidebook.  Patient learns the recommended [facility] exercise program. Exercise with the goal of living a long, healthy life. Some of the health benefits of exercise include controlled diabetes, healthier blood pressure levels, improved cholesterol levels, improved heart and lung capacity, improved sleep, and better body composition. Everyone should speak with their doctor before starting or changing an exercise routine.  Biomechanical Limitations Clinical staff conducted group or individual video education with verbal and written material and guidebook.  Patient  learns how biomechanical limitations can impact exercise and how we can mitigate and possibly overcome limitations to have an impactful and balanced exercise routine.  Body Composition Clinical staff conducted group or individual video education with verbal and written material and guidebook.  Patient learns that body composition  (ratio of muscle mass to fat mass) is a key component to assessing overall fitness, rather than body weight alone. Increased fat mass, especially visceral belly fat, can put [place] at increased risk for metabolic syndrome, type 2 diabetes, heart disease, and even death. It is recommended to combine diet and exercise (cardiovascular and resistance training) to improve your body composition. Seek guidance from your physician and exercise physiologist before implementing an exercise routine.  Exercise Action Plan Clinical staff conducted group or individual video education with verbal and written material and guidebook.  Patient learns the recommended strategies to achieve and enjoy long-term exercise adherence, including variety, self-motivation, self-efficacy, and positive decision making. Benefits of exercise include fitness, good health, weight management, more energy, better sleep, less stress, and overall well-being.  Medical   Heart Disease Risk Reduction Clinical staff conducted group or individual video education with verbal and written material and guidebook.  Patient learns our heart is our most vital organ as it circulates oxygen, nutrients, white blood cells, and hormones throughout the entire body, and carries waste away. Data supports a plant-based eating plan like the Pritikin Program for its effectiveness in slowing progression of and reversing heart disease. The video provides a number of recommendations to address heart disease.   Metabolic Syndrome and Belly Fat  Clinical staff conducted group or individual video education with verbal and written material and guidebook.  Patient learns what metabolic syndrome is, how it leads to heart disease, and how one can reverse it and keep it from coming back. You have metabolic syndrome if you have 3 of the following 5 criteria: abdominal obesity, high blood pressure, high triglycerides, low HDL cholesterol, and high blood sugar.  Hypertension and  Heart Disease Clinical staff conducted group or individual video education with verbal and written material and guidebook.  Patient learns that high blood pressure, or hypertension, is very common in the [place]. Hypertension is largely due to excessive salt intake, but other important risk factors include being overweight, physical inactivity, drinking too much alcohol, smoking, and not eating enough potassium from fruits and vegetables. High blood pressure is a leading risk factor for heart attack, stroke, congestive heart failure, dementia, kidney failure, and premature death. Long-term effects of excessive salt intake include stiffening of the arteries and thickening of heart muscle and organ damage. Recommendations include ways to reduce hypertension and the risk of heart disease.  Diseases of Our Time - Focusing on Diabetes Clinical staff conducted group or individual video education with verbal and written material and guidebook.  Patient learns why the best way to stop diseases of our time is prevention, through food and other lifestyle changes. Medicine (such as prescription pills and surgeries) is often only a Band-Aid on the problem, not a long-term solution. Most common diseases of our time include obesity, type 2 diabetes, hypertension, heart disease, and cancer. The Pritikin Program is recommended and has been proven to help reduce, reverse, and/or prevent the damaging effects of metabolic syndrome.  Nutrition   Overview of the Pritikin Eating Plan  Clinical staff conducted group or individual video education with verbal and written material and guidebook.  Patient learns about the Pritikin Eating Plan for disease risk reduction. The  Pritikin Eating Plan emphasizes a wide variety of unrefined, minimally-processed carbohydrates, like fruits, vegetables, whole grains, and legumes. Go, Caution, and Stop food choices are explained. Plant-based and lean animal proteins are emphasized.  Rationale provided for low sodium intake for blood pressure control, low added sugars for blood sugar stabilization, and low added fats and oils for coronary artery disease risk reduction and weight management.  Calorie Density  Clinical staff conducted group or individual video education with verbal and written material and guidebook.  Patient learns about calorie density and how it impacts the Pritikin Eating Plan. Knowing the characteristics of the food you choose will help you decide whether those foods will lead to weight gain or weight loss, and whether you want to consume more or less of them. Weight loss is usually a side effect of the Pritikin Eating Plan because of its focus on low calorie-dense foods.  Label Reading  Clinical staff conducted group or individual video education with verbal and written material and guidebook.  Patient learns about the Pritikin recommended label reading guidelines and corresponding recommendations regarding calorie density, added sugars, sodium content, and whole grains.  Dining Out - Part 1  Clinical staff conducted group or individual video education with verbal and written material and guidebook.  Patient learns that restaurant meals can be sabotaging because they can be so high in calories, fat, sodium, and/or sugar. Patient learns recommended strategies on how to positively address this and avoid unhealthy pitfalls.  Facts on Fats  Clinical staff conducted group or individual video education with verbal and written material and guidebook.  Patient learns that lifestyle modifications can be just as effective, if not more so, as many medications for lowering your risk of heart disease. A Pritikin lifestyle can help to reduce your risk of inflammation and atherosclerosis (cholesterol build-up, or plaque, in the artery walls). Lifestyle interventions such as dietary choices and physical activity address the cause of atherosclerosis. A review of the types of  fats and their impact on blood cholesterol levels, along with dietary recommendations to reduce fat intake is also included.  Nutrition Action Plan  Clinical staff conducted group or individual video education with verbal and written material and guidebook.  Patient learns how to incorporate Pritikin recommendations into their lifestyle. Recommendations include planning and keeping personal health goals in mind as an important part of their success.  Healthy Mind-Set    Healthy Minds, Bodies, Hearts  Clinical staff conducted group or individual video education with verbal and written material and guidebook.  Patient learns how to identify when they are stressed. Video will discuss the impact of that stress, as well as the many benefits of stress management. Patient will also be introduced to stress management techniques. The way we think, act, and feel has an impact on our hearts.  How Our Thoughts Can Heal Our Hearts  Clinical staff conducted group or individual video education with verbal and written material and guidebook.  Patient learns that negative thoughts can cause depression and anxiety. This can result in negative lifestyle behavior and serious health problems. Cognitive behavioral therapy is an effective method to help control our thoughts in order to change and improve our emotional outlook.  Additional Videos:  Exercise    Improving Performance  Clinical staff conducted group or individual video education with verbal and written material and guidebook.  Patient learns to use a non-linear approach by alternating intensity levels and lengths of time spent exercising to help burn more calories and lose more  body fat. Cardiovascular exercise helps improve heart health, metabolism, hormonal balance, blood sugar control, and recovery from fatigue. Resistance training improves strength, endurance, balance, coordination, reaction time, metabolism, and muscle mass. Flexibility exercise  improves circulation, posture, and balance. Seek guidance from your physician and exercise physiologist before implementing an exercise routine and learn your capabilities and proper form for all exercise.  Introduction to Yoga  Clinical staff conducted group or individual video education with verbal and written material and guidebook.  Patient learns about yoga, a discipline of the coming together of mind, breath, and body. The benefits of yoga include improved flexibility, improved range of motion, better posture and core strength, increased lung function, weight loss, and positive self-image. Yoga's heart health benefits include lowered blood pressure, healthier heart rate, decreased cholesterol and triglyceride levels, improved immune function, and reduced stress. Seek guidance from your physician and exercise physiologist before implementing an exercise routine and learn your capabilities and proper form for all exercise.  Medical   Aging: Enhancing Your Quality of Life  Clinical staff conducted group or individual video education with verbal and written material and guidebook.  Patient learns key strategies and recommendations to stay in good physical health and enhance quality of life, such as prevention strategies, having an advocate, securing a Health Care Proxy and Power of Attorney, and keeping a list of medications and system for tracking them. It also discusses how to avoid risk for bone loss.  Biology of Weight Control  Clinical staff conducted group or individual video education with verbal and written material and guidebook.  Patient learns that weight gain occurs because we consume more calories than we burn (eating more, moving less). Even if your body weight is normal, you may have higher ratios of fat compared to muscle mass. Too much body fat puts you at increased risk for cardiovascular disease, heart attack, stroke, type 2 diabetes, and obesity-related cancers. In addition to  exercise, following the Pritikin Eating Plan can help reduce your risk.  Decoding Lab Results  Clinical staff conducted group or individual video education with verbal and written material and guidebook.  Patient learns that lab test reflects one measurement whose values change over time and are influenced by many factors, including medication, stress, sleep, exercise, food, hydration, pre-existing medical conditions, and more. It is recommended to use the knowledge from this video to become more involved with your lab results and evaluate your numbers to speak with your doctor.   Diseases of Our Time - Overview  Clinical staff conducted group or individual video education with verbal and written material and guidebook.  Patient learns that according to the CDC, 50% to 70% of chronic diseases (such as obesity, type 2 diabetes, elevated lipids, hypertension, and heart disease) are avoidable through lifestyle improvements including healthier food choices, listening to satiety cues, and increased physical activity.  Sleep Disorders Clinical staff conducted group or individual video education with verbal and written material and guidebook.  Patient learns how good quality and duration of sleep are important to overall health and well-being. Patient also learns about sleep disorders and how they impact health along with recommendations to address them, including discussing with a physician.  Nutrition  Dining Out - Part 2 Clinical staff conducted group or individual video education with verbal and written material and guidebook.  Patient learns how to plan ahead and communicate in order to maximize their dining experience in a healthy and nutritious manner. Included are recommended food choices based on the type of  restaurant the patient is visiting.   Fueling a Banker conducted group or individual video education with verbal and written material and guidebook.  There is a  strong connection between our food choices and our health. Diseases like obesity and type 2 diabetes are very prevalent and are in large-part due to lifestyle choices. The Pritikin Eating Plan provides plenty of food and hunger-curbing satisfaction. It is easy to follow, affordable, and helps reduce health risks.  Menu Workshop  Clinical staff conducted group or individual video education with verbal and written material and guidebook.  Patient learns that restaurant meals can sabotage health goals because they are often packed with calories, fat, sodium, and sugar. Recommendations include strategies to plan ahead and to communicate with the manager, chef, or server to help order a healthier meal.  Planning Your Eating Strategy  Clinical staff conducted group or individual video education with verbal and written material and guidebook.  Patient learns about the Pritikin Eating Plan and its benefit of reducing the risk of disease. The Pritikin Eating Plan does not focus on calories. Instead, it emphasizes high-quality, nutrient-rich foods. By knowing the characteristics of the foods, we choose, we can determine their calorie density and make informed decisions.  Targeting Your Nutrition Priorities  Clinical staff conducted group or individual video education with verbal and written material and guidebook.  Patient learns that lifestyle habits have a tremendous impact on disease risk and progression. This video provides eating and physical activity recommendations based on your personal health goals, such as reducing LDL cholesterol, losing weight, preventing or controlling type 2 diabetes, and reducing high blood pressure.  Vitamins and Minerals  Clinical staff conducted group or individual video education with verbal and written material and guidebook.  Patient learns different ways to obtain key vitamins and minerals, including through a recommended healthy diet. It is important to discuss all  supplements you take with your doctor.   Healthy Mind-Set    Smoking Cessation  Clinical staff conducted group or individual video education with verbal and written material and guidebook.  Patient learns that cigarette smoking and tobacco addiction pose a serious health risk which affects millions of people. Stopping smoking will significantly reduce the risk of heart disease, lung disease, and many forms of cancer. Recommended strategies for quitting are covered, including working with your doctor to develop a successful plan.  Culinary   Becoming a Set designer conducted group or individual video education with verbal and written material and guidebook.  Patient learns that cooking at home can be healthy, cost-effective, quick, and puts them in control. Keys to cooking healthy recipes will include looking at your recipe, assessing your equipment needs, planning ahead, making it simple, choosing cost-effective seasonal ingredients, and limiting the use of added fats, salts, and sugars.  Cooking - Breakfast and Snacks  Clinical staff conducted group or individual video education with verbal and written material and guidebook.  Patient learns how important breakfast is to satiety and nutrition through the entire day. Recommendations include key foods to eat during breakfast to help stabilize blood sugar levels and to prevent overeating at meals later in the day. Planning ahead is also a key component.  Cooking - Educational psychologist conducted group or individual video education with verbal and written material and guidebook.  Patient learns eating strategies to improve overall health, including an approach to cook more at home. Recommendations include thinking of animal protein as a side  on your plate rather than center stage and focusing instead on lower calorie dense options like vegetables, fruits, whole grains, and plant-based proteins, such as beans. Making sauces  in large quantities to freeze for later and leaving the skin on your vegetables are also recommended to maximize your experience.  Cooking - Healthy Salads and Dressing Clinical staff conducted group or individual video education with verbal and written material and guidebook.  Patient learns that vegetables, fruits, whole grains, and legumes are the foundations of the Pritikin Eating Plan. Recommendations include how to incorporate each of these in flavorful and healthy salads, and how to create homemade salad dressings. Proper handling of ingredients is also covered. Cooking - Soups and [employer] - Soups and Desserts Clinical staff conducted group or individual video education with verbal and written material and guidebook.  Patient learns that Pritikin soups and desserts make for easy, nutritious, and delicious snacks and meal components that are low in sodium, fat, sugar, and calorie density, while high in vitamins, minerals, and filling fiber. Recommendations include simple and healthy ideas for soups and desserts.   Overview     The Pritikin Solution Program Overview Clinical staff conducted group or individual video education with verbal and written material and guidebook.  Patient learns that the results of the Pritikin Program have been documented in more than 100 articles published in peer-reviewed journals, and the benefits include reducing risk factors for (and, in some cases, even reversing) high cholesterol, high blood pressure, type 2 diabetes, obesity, and more! An overview of the three key pillars of the Pritikin Program will be covered: eating well, doing regular exercise, and having a healthy mind-set.  WORKSHOPS  Exercise: Exercise Basics: Building Your Action Plan Clinical staff led group instruction and group discussion with PowerPoint presentation and patient guidebook. To enhance the learning environment the use of posters, models and videos may be added. At the  conclusion of this workshop, patients will comprehend the difference between physical activity and exercise, as well as the benefits of incorporating both, into their routine. Patients will understand the FITT (Frequency, Intensity, Time, and Type) principle and how to use it to build an exercise action plan. In addition, safety concerns and other considerations for exercise and cardiac rehab will be addressed by the presenter. The purpose of this lesson is to promote a comprehensive and effective weekly exercise routine in order to improve patients' overall level of fitness.   Managing Heart Disease: Your Path to a Healthier Heart Clinical staff led group instruction and group discussion with PowerPoint presentation and patient guidebook. To enhance the learning environment the use of posters, models and videos may be added.At the conclusion of this workshop, patients will understand the anatomy and physiology of the heart. Additionally, they will understand how Pritikin's three pillars impact the risk factors, the progression, and the management of heart disease.  The purpose of this lesson is to provide a high-level overview of the heart, heart disease, and how the Pritikin lifestyle positively impacts risk factors.  Exercise Biomechanics Clinical staff led group instruction and group discussion with PowerPoint presentation and patient guidebook. To enhance the learning environment the use of posters, models and videos may be added. Patients will learn how the structural parts of their bodies function and how these functions impact their daily activities, movement, and exercise. Patients will learn how to promote a neutral spine, learn how to manage pain, and identify ways to improve their physical movement in order to promote healthy  living. The purpose of this lesson is to expose patients to common physical limitations that impact physical activity. Participants will learn practical ways  to adapt and manage aches and pains, and to minimize their effect on regular exercise. Patients will learn how to maintain good posture while sitting, walking, and lifting.  Balance Training and Fall Prevention  Clinical staff led group instruction and group discussion with PowerPoint presentation and patient guidebook. To enhance the learning environment the use of posters, models and videos may be added. At the conclusion of this workshop, patients will understand the importance of their sensorimotor skills (vision, proprioception, and the vestibular system) in maintaining their ability to balance as they age. Patients will apply a variety of balancing exercises that are appropriate for their current level of function. Patients will understand the common causes for poor balance, possible solutions to these problems, and ways to modify their physical environment in order to minimize their fall risk. The purpose of this lesson is to teach patients about the importance of maintaining balance as they age and ways to minimize their risk of falling.  WORKSHOPS   Nutrition:  Fueling a Ship broker led group instruction and group discussion with PowerPoint presentation and patient guidebook. To enhance the learning environment the use of posters, models and videos may be added. Patients will review the foundational principles of the Pritikin Eating Plan and understand what constitutes a serving size in each of the food groups. Patients will also learn Pritikin-friendly foods that are better choices when away from home and review make-ahead meal and snack options. Calorie density will be reviewed and applied to three nutrition priorities: weight maintenance, weight loss, and weight gain. The purpose of this lesson is to reinforce (in a group setting) the key concepts around what patients are recommended to eat and how to apply these guidelines when away from home by planning and selecting  Pritikin-friendly options. Patients will understand how calorie density may be adjusted for different weight management goals.  Mindful Eating  Clinical staff led group instruction and group discussion with PowerPoint presentation and patient guidebook. To enhance the learning environment the use of posters, models and videos may be added. Patients will briefly review the concepts of the Pritikin Eating Plan and the importance of low-calorie dense foods. The concept of mindful eating will be introduced as well as the importance of paying attention to internal hunger signals. Triggers for non-hunger eating and techniques for dealing with triggers will be explored. The purpose of this lesson is to provide patients with the opportunity to review the basic principles of the Pritikin Eating Plan, discuss the value of eating mindfully and how to measure internal cues of hunger and fullness using the Hunger Scale. Patients will also discuss reasons for non-hunger eating and learn strategies to use for controlling emotional eating.  Targeting Your Nutrition Priorities Clinical staff led group instruction and group discussion with PowerPoint presentation and patient guidebook. To enhance the learning environment the use of posters, models and videos may be added. Patients will learn how to determine their genetic susceptibility to disease by reviewing their family history. Patients will gain insight into the importance of diet as part of an overall healthy lifestyle in mitigating the impact of genetics and other environmental insults. The purpose of this lesson is to provide patients with the opportunity to assess their personal nutrition priorities by looking at their family history, their own health history and current risk factors. Patients will also be able  to discuss ways of prioritizing and modifying the Pritikin Eating Plan for their highest risk areas  Menu  Clinical staff led group instruction and group  discussion with PowerPoint presentation and patient guidebook. To enhance the learning environment the use of posters, models and videos may be added. Using menus brought in from [employer], or printed from [employer], patients will apply the Pritikin dining out guidelines that were presented in the [employer] video. Patients will also be able to practice these guidelines in a variety of provided scenarios. The purpose of this lesson is to provide patients with the opportunity to practice hands-on learning of the Pritikin Dining Out guidelines with actual menus and practice scenarios.  Label Reading Clinical staff led group instruction and group discussion with PowerPoint presentation and patient guidebook. To enhance the learning environment the use of posters, models and videos may be added. Patients will review and discuss the Pritikin label reading guidelines presented in [employer]'s Label Reading Educational series video. Using fool labels brought in from local grocery stores and markets, patients will apply the label reading guidelines and determine if the packaged food meet the Pritikin guidelines. The purpose of this lesson is to provide patients with the opportunity to review, discuss, and practice hands-on learning of the Pritikin Label Reading guidelines with actual packaged food labels. Cooking School  Pritik[employer]'s [employer] are designed to teach patients ways to prepare quick, simple, and affordable recipes at home. The importance of nutrition's role in chronic disease risk reduction is reflected in its emphasis in the overall Pritikin program. By learning how to prepare essential core Pritikin Eating Plan recipes, patients will increase control over what they eat; be able to customize the flavor of foods without the use of added salt, sugar, or fat; and improve the quality of the food they consume. By learning a set of core recipes which are easily  assembled, quickly prepared, and affordable, patients are more likely to prepare more healthy foods at home. These workshops focus on convenient breakfasts, simple entres, side dishes, and desserts which can be prepared with minimal effort and are consistent with nutrition recommendations for cardiovascular risk reduction. Cooking [employer] are taught by a Armed forces logistics/support/administrative officer (RD) who has been trained by the [employer]. The chef or RD has a clear understanding of the importance of minimizing - if not completely eliminating - added fat, sugar, and sodium in recipes. Throughout the series of Cooking School Workshop sessions, patients will learn about healthy ingredients and efficient methods of cooking to build confidence in their capability to prepare    Cooking School weekly topics:  Adding Flavor- Sodium-Free  Fast and Healthy Breakfasts  Powerhouse Plant-Based Proteins  Satisfying Salads and Dressings  Simple Sides and Sauces  International Cuisine-Spotlight on the [employer] Zones  Delicious Desserts  Savory Soups  [employer] - Meals in a Astronomer Appetizers and Snacks  Comforting Weekend Breakfasts  One-Pot Wonders   Fast Evening Meals  Landscape architect Your Pritikin Plate  WORKSHOPS   Healthy Mindset (Psychosocial):  Focused Goals, Sustainable Changes Clinical staff led group instruction and group discussion with PowerPoint presentation and patient guidebook. To enhance the learning environment the use of posters, models and videos may be added. Patients will be able to apply effective goal setting strategies to establish at least one personal goal, and then take consistent, meaningful action toward that goal. They will learn to identify common barriers to achieving  personal goals and develop strategies to overcome them. Patients will also gain an understanding of how our mind-set can impact our ability to achieve goals and the  importance of cultivating a positive and growth-oriented mind-set. The purpose of this lesson is to provide patients with a deeper understanding of how to set and achieve personal goals, as well as the tools and strategies needed to overcome common obstacles which may arise along the way.  From Head to Heart: The Power of a Healthy Outlook  Clinical staff led group instruction and group discussion with PowerPoint presentation and patient guidebook. To enhance the learning environment the use of posters, models and videos may be added. Patients will be able to recognize and describe the impact of emotions and mood on physical health. They will discover the importance of self-care and explore self-care practices which may work for them. Patients will also learn how to utilize the 4 C's to cultivate a healthier outlook and better manage stress and challenges. The purpose of this lesson is to demonstrate to patients how a healthy outlook is an essential part of maintaining good health, especially as they continue their cardiac rehab journey.  Healthy Sleep for a Healthy Heart Clinical staff led group instruction and group discussion with PowerPoint presentation and patient guidebook. To enhance the learning environment the use of posters, models and videos may be added. At the conclusion of this workshop, patients will be able to demonstrate knowledge of the importance of sleep to overall health, well-being, and quality of life. They will understand the symptoms of, and treatments for, common sleep disorders. Patients will also be able to identify daytime and nighttime behaviors which impact sleep, and they will be able to apply these tools to help manage sleep-related challenges. The purpose of this lesson is to provide patients with a general overview of sleep and outline the importance of quality sleep. Patients will learn about a few of the most common sleep disorders. Patients will also be introduced to the  concept of "sleep hygiene," and discover ways to self-manage certain sleeping problems through simple daily behavior changes. Finally, the workshop will motivate patients by clarifying the links between quality sleep and their goals of heart-healthy living.   Recognizing and Reducing Stress Clinical staff led group instruction and group discussion with PowerPoint presentation and patient guidebook. To enhance the learning environment the use of posters, models and videos may be added. At the conclusion of this workshop, patients will be able to understand the types of stress reactions, differentiate between acute and chronic stress, and recognize the impact that chronic stress has on their health. They will also be able to apply different coping mechanisms, such as reframing negative self-talk. Patients will have the opportunity to practice a variety of stress management techniques, such as deep abdominal breathing, progressive muscle relaxation, and/or guided imagery.  The purpose of this lesson is to educate patients on the role of stress in their lives and to provide healthy techniques for coping with it.  Learning Barriers/Preferences:  Learning Barriers/Preferences - 11/16/23 1137       Learning Barriers/Preferences   Learning Barriers Sight   wears glasses   Learning Preferences Audio;Computer/Internet;Group Instruction;Individual Instruction;Pictoral;Skilled Demonstration;Video;Verbal Instruction;Written Material             Education Topics:  Knowledge Questionnaire Score:  Knowledge Questionnaire Score - 11/16/23 1138       Knowledge Questionnaire Score   Pre Score 18/24  Core Components/Risk Factors/Patient Goals at Admission:  Personal Goals and Risk Factors at Admission - 11/16/23 1138       Core Components/Risk Factors/Patient Goals on Admission    Weight Management Yes;Obesity    Intervention Weight Management: Provide education and appropriate  resources to help participant work on and attain dietary goals.;Weight Management/Obesity: Establish reasonable short term and long term weight goals.;Obesity: Provide education and appropriate resources to help participant work on and attain dietary goals.;Weight Management: Develop a combined nutrition and exercise program designed to reach desired caloric intake, while maintaining appropriate intake of nutrient and fiber, sodium and fats, and appropriate energy expenditure required for the weight goal.    Expected Outcomes Short Term: Continue to assess and modify interventions until short term weight is achieved;Long Term: Adherence to nutrition and physical activity/exercise program aimed toward attainment of established weight goal;Understanding recommendations for meals to include 15-35% energy as protein, 25-35% energy from fat, 35-60% energy from carbohydrates, less than 200mg  of dietary cholesterol, 20-35 gm of total fiber daily;Understanding of distribution of calorie intake throughout the day with the consumption of 4-5 meals/snacks    Diabetes Yes    Intervention Provide education about signs/symptoms and action to take for hypo/hyperglycemia.;Provide education about proper nutrition, including hydration, and aerobic/resistive exercise prescription along with prescribed medications to achieve blood glucose in normal ranges: Fasting glucose 65-99 mg/dL    Expected Outcomes Short Term: Participant verbalizes understanding of the signs/symptoms and immediate care of hyper/hypoglycemia, proper foot care and importance of medication, aerobic/resistive exercise and nutrition plan for blood glucose control.;Long Term: Attainment of HbA1C < 7%.    Heart Failure Yes    Intervention Provide a combined exercise and nutrition program that is supplemented with education, support and counseling about heart failure. Directed toward relieving symptoms such as shortness of breath, decreased exercise tolerance, and  extremity edema.    Expected Outcomes Improve functional capacity of life;Short term: Attendance in program 2-3 days a week with increased exercise capacity. Reported lower sodium intake. Reported increased fruit and vegetable intake. Reports medication compliance.;Short term: Daily weights obtained and reported for increase. Utilizing diuretic protocols set by physician.;Long term: Adoption of self-care skills and reduction of barriers for early signs and symptoms recognition and intervention leading to self-care maintenance.    Hypertension Yes    Intervention Provide education on lifestyle modifcations including regular physical activity/exercise, weight management, moderate sodium restriction and increased consumption of fresh fruit, vegetables, and low fat dairy, alcohol moderation, and smoking cessation.;Monitor prescription use compliance.    Expected Outcomes Short Term: Continued assessment and intervention until BP is < 140/52mm HG in hypertensive participants. < 130/36mm HG in hypertensive participants with diabetes, heart failure or chronic kidney disease.;Long Term: Maintenance of blood pressure at goal levels.    Lipids Yes    Intervention Provide education and support for participant on nutrition & aerobic/resistive exercise along with prescribed medications to achieve LDL 70mg , HDL >40mg .    Expected Outcomes Short Term: Participant states understanding of desired cholesterol values and is compliant with medications prescribed. Participant is following exercise prescription and nutrition guidelines.;Long Term: Cholesterol controlled with medications as prescribed, with individualized exercise RX and with personalized nutrition plan. Value goals: LDL < 70mg , HDL > 40 mg.             Core Components/Risk Factors/Patient Goals Review:   Goals and Risk Factor Review     Row Name 11/22/23 1429 12/18/23 1710  Core Components/Risk Factors/Patient Goals Review   Personal  Goals Review Weight Management/Obesity;Lipids;Heart Failure;Hypertension;Diabetes Weight Management/Obesity;Lipids;Heart Failure;Hypertension;Diabetes      Review Madinah started cardiac rehab on 11/22/23. Nohelia did well with well with exercise. Nonna is somewhat deconditioned. Vital signs and CBg's were stable. Seryna is doing well with exercise at cardiac rehab. Vital signs and CBg's have been stable. Jevaeh has had some adjustments with her insulin dose due to drop in CBG's post exercise.      Expected Outcomes Ramina will continue to participate in cardiac rehab for exercise, nutrition and lifstyle modifications Kameran will continue to participate in cardiac rehab for exercise, nutrition and lifstyle modifications               Core Components/Risk Factors/Patient Goals at Discharge (Final Review):   Goals and Risk Factor Review - 12/18/23 1710       Core Components/Risk Factors/Patient Goals Review   Personal Goals Review Weight Management/Obesity;Lipids;Heart Failure;Hypertension;Diabetes    Review Tamsyn is doing well with exercise at cardiac rehab. Vital signs and CBg's have been stable. Lameka has had some adjustments with her insulin dose due to drop in CBG's post exercise.    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 Introduction to pritikin education/intensive cardiac rehab. Initial orientation packet reviewed with patient. 30 day ITP Review. Dashayla started cardiac rehab on 11/22/23. Juanice did well with exercise. 30 day ITP Review. Denni has good attendance and participation with exercise at cardiac rehab. [place] will be absent this week due to cold symptoms.              Comments: See ITP Comments

## 2023-12-19 NOTE — Progress Notes (Deleted)
 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 Swallow whole., Disp: , Rfl:    BD INSULIN SYRINGE U/F 1/2UNIT 31G X 5/16" 0.3 ML MISC, , Disp: , Rfl:    Beta Carotene (VITAMIN A) 25000 UNIT capsule, Take 25,000 Units by mouth daily., Disp: , Rfl:    carvedilol (COREG) 3.125 MG tablet, Take 1 tablet (3.125 mg total) by mouth 2 (two) times daily., Disp: 60 tablet, Rfl: 3   cyanocobalamin (VITAMIN B12) 1000 MCG tablet, Take 2,000 mcg by mouth daily., Disp: , Rfl:    dapagliflozin propanediol (FARXIGA) 10 MG TABS tablet, Take 1 tablet (10 mg total) by mouth daily., Disp: 30 tablet, Rfl: 5   ezetimibe (ZETIA) 10 MG tablet, Take 1 tablet (10 mg total) by mouth daily., Disp: 90 tablet, Rfl: 2   fish oil-omega-3 fatty acids 1000 MG capsule, Take 1 g by mouth daily., Disp: , Rfl:    furosemide (LASIX) 40 MG tablet, Take 1 tablet (40 mg total) by mouth  daily., Disp: 30 tablet, Rfl: 3   gabapentin (NEURONTIN) 100 MG capsule, Take 100 mg by mouth 2 (two) times daily. Take 100 mg at  morning and bedtime, Disp: , Rfl:    insulin NPH-regular Human (NOVOLIN 70/30) (70-30) 100 UNIT/ML injection, Inject 30-60 Units into the skin 2 (two) times daily., Disp: , Rfl:    loratadine (CLARITIN) 10 MG tablet, Take 10 mg by mouth daily., Disp: , Rfl:    Magnesium Hydroxide (DULCOLAX SOFT CHEWS PO), Take 600 mg by mouth daily., Disp: , Rfl:    Oyster Shell (OYSTER CALCIUM) 500 MG TABS tablet, Take 500 mg of elemental calcium by mouth 2 (two) times a week., Disp: , Rfl:    pantoprazole (PROTONIX) 40 MG tablet, Take 1 tablet by mouth daily. Reported on 03/24/2016, Disp: , Rfl:    sacubitril-valsartan (ENTRESTO) 24-26 MG, Take 1 tablet by mouth 2 (two) times daily., Disp: 60 tablet, Rfl: 6   sennosides-docusate sodium (SENOKOT-S) 8.6-50 MG tablet, Take 1 tablet by mouth daily., Disp: , Rfl:    spironolactone (ALDACTONE) 25 MG tablet, Take 1 tablet (25 mg total) by mouth daily., Disp: 30 tablet, Rfl: 5   sulfamethoxazole-trimethoprim (BACTRIM DS) 800-160 MG tablet, Take 1 tablet by mouth 2 (two) times daily. For 7 days, Disp: , Rfl:    traMADol (ULTRAM) 50 MG tablet, Take  1 tablet (50 mg total) by mouth every 12 (twelve) hours as needed for severe pain (pain score 7-10)., Disp: 20 tablet, Rfl: 0  Past 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    Dyslipidemia    HTN (hypertension)     Tobacco Use: Social History   Tobacco Use  Smoking Status Never   Passive exposure: Never  Smokeless Tobacco Never    Labs: Review Flowsheet  More data exists      Latest Ref Rng & Units 09/17/2023 09/18/2023 09/19/2023 09/20/2023 09/21/2023  Labs for ITP Cardiac and Pulmonary Rehab  O2 Saturation % 54.6  59.6  78.6  54.5  56.7      Capillary Blood Glucose: Lab Results  Component Value Date   GLUCAP 104 (H) 12/01/2023   GLUCAP 112 (H) 12/01/2023   GLUCAP 93 11/29/2023   GLUCAP 58 (L) 11/29/2023   GLUCAP 210 (H) 11/27/2023     Exercise Target Goals: Exercise Program Goal: Individual exercise prescription set using results from initial 6 min walk test and THRR while considering  patient's activity barriers and safety.   Exercise Prescription Goal: Initial exercise prescription builds to 30-45 minutes a day of aerobic activity, 2-3 days per week.  Home exercise guidelines will be given to patient during program as part of exercise prescription that the participant will acknowledge.  Activity Barriers & Risk Stratification:  Activity Barriers & Cardiac Risk Stratification - 11/16/23 1132       Activity Barriers & Cardiac Risk Stratification   Activity Barriers Arthritis;Deconditioning;Shortness of Breath;Decreased Ventricular Function;Balance Concerns;Assistive Device    Cardiac Risk Stratification High   <5 METs on            6 Minute Walk:  6 Minute Walk     Row Name 11/16/23 1131         6 Minute Walk   Phase Initial     Distance 720 feet     Walk Time 6 minutes     # of Rest Breaks 0     MPH 1.36     METS 1.38     RPE 9     Perceived Dyspnea  1     VO2 Peak 4.84     Symptoms Yes (comment)     Comments a little winded- resolved with rest. Used Rollator.     Resting HR 97 bpm     Resting BP 106/60     Resting Oxygen Saturation  94 %     Exercise Oxygen Saturation  during 6 min walk 94 %     Max Ex. HR 118 bpm     Max Ex. BP 118/70     2 Minute Post BP 110/68              Oxygen Initial Assessment:   Oxygen Re-Evaluation:   Oxygen Discharge (Final Oxygen Re-Evaluation):   Initial Exercise Prescription:  Initial Exercise Prescription - 11/16/23 1100       Date of Initial Exercise RX and Referring Provider   Date 11/16/23    Referring Provider Arvilla Meres, MD    Expected Discharge Date 02/10/24      NuStep   Level 1    SPM 60    Minutes 15    METs 1.6      Prescription Details  Frequency (times per week) 3    Duration Progress to 30 minutes of continuous aerobic without signs/symptoms of physical distress      Intensity   THRR 40-80% of Max Heartrate 58-118    Ratings of Perceived Exertion 11-13    Perceived Dyspnea 0-4      Progression   Progression Continue progressive overload as per policy without signs/symptoms or physical distress.      Resistance Training   Training Prescription Yes    Weight 2    Reps 10-15             Perform Capillary Blood Glucose checks as needed.  Exercise Prescription Changes:   Exercise Prescription Changes     Row Name 11/22/23 1028 12/08/23 1027 12/15/23 1033         Response to Exercise   Blood Pressure (Admit) 110/58 104/56 124/74     Blood Pressure (Exercise) 108/60 106/54 102/58     Blood Pressure (Exit) 108/60 96/58 93/59      Heart Rate (Admit) 104 bpm 90 bpm 93 bpm     Heart Rate (Exercise) 107 bpm 104 bpm 106 bpm     Heart Rate (Exit) 100 bpm 93 bpm 94 bpm     Rating of Perceived Exertion (Exercise) 11 10 12      Symptoms None None None     Comments Off to a good start with exercise. -- --     Duration Progress to 30 minutes of  aerobic without signs/symptoms of physical distress Progress to 30 minutes of  aerobic without signs/symptoms of physical distress Progress to 30 minutes of  aerobic without signs/symptoms of physical distress     Intensity THRR unchanged THRR unchanged THRR unchanged       Progression   Progression Continue to progress workloads to maintain intensity without signs/symptoms of physical distress. Continue to progress workloads to maintain intensity without signs/symptoms of physical distress. Continue to progress workloads to maintain intensity without signs/symptoms of physical distress.     Average METs 1.6 2.2 1.8       Resistance  Training   Training Prescription No Yes Yes     Weight Relaxation day, no weights. 2 lbs 2 lbs     Reps -- 10-15 10-15     Time -- 10 Minutes 10 Minutes       Interval Training   Interval Training No No No       NuStep   Level 1 1 1      SPM 64 -- 106     Minutes 25 25 25      METs 1.6 2.2 1.8              Exercise Comments:   Exercise Comments     Row Name 11/22/23 1132           Exercise Comments Nikola tolerated low intensity exercise well without symptoms.                Exercise Goals and Review:   Exercise Goals     Row Name 11/16/23 1133             Exercise Goals   Increase Physical Activity Yes       Intervention Develop an individualized exercise prescription for aerobic and resistive training based on initial evaluation findings, risk stratification, comorbidities and participant's personal goals.;Provide advice, education, support and counseling about physical activity/exercise needs.       Expected Outcomes Short Term: Attend rehab on a regular  basis to increase amount of physical activity.;Long Term: Exercising regularly at least 3-5 days a week.;Long Term: Add in home exercise to make exercise part of routine and to increase amount of physical activity.       Increase Strength and Stamina Yes       Intervention Provide advice, education, support and counseling about physical activity/exercise needs.;Develop an individualized exercise prescription for aerobic and resistive training based on initial evaluation findings, risk stratification, comorbidities and participant's personal goals.       Expected Outcomes Short Term: Increase workloads from initial exercise prescription for resistance, speed, and METs.;Short Term: Perform resistance training exercises routinely during rehab and add in resistance training at home;Long Term: Improve cardiorespiratory fitness, muscular endurance and strength as measured by increased METs and functional capacity  ( )       Able to understand and use rate of perceived exertion (RPE) scale Yes       Intervention Provide education and explanation on how to use RPE scale       Expected Outcomes Short Term: Able to use RPE daily in rehab to express subjective intensity level;Long Term:  Able to use RPE to guide intensity level when exercising independently       Knowledge and understanding of Target Heart Rate Range (THRR) Yes       Intervention Provide education and explanation of THRR including how the numbers were predicted and where they are located for reference       Expected Outcomes Short Term: Able to state/look up THRR;Short Term: Able to use daily as guideline for intensity in rehab;Long Term: Able to use THRR to govern intensity when exercising independently       Understanding of Exercise Prescription Yes       Intervention Provide education, explanation, and written materials on patient's individual exercise prescription       Expected Outcomes Short Term: Able to explain program exercise prescription;Long Term: Able to explain home exercise prescription to exercise independently                Exercise Goals Re-Evaluation :  Exercise Goals Re-Evaluation     Row Name 11/22/23 1132 12/19/23 0744           Exercise Goal Re-Evaluation   Exercise Goals Review Increase Physical Activity;Increase Strength and Stamina;Able to understand and use rate of perceived exertion (RPE) scale Increase Physical Activity;Increase Strength and Stamina;Able to understand and use rate of perceived exertion (RPE) scale      Comments Patient able to understand and use RPE scale appropriately. Patient is out sick. Unable to review goals at this time. She is making gradual progress with exericse.      Expected Outcomes Progress duration to achieve 30 minutes of exercise. Review goal upon return to cardiac rehab.               Discharge Exercise Prescription (Final Exercise Prescription Changes):   Exercise Prescription Changes - 12/15/23 1033       Response to Exercise   Blood Pressure (Admit) 124/74    Blood Pressure (Exercise) 102/58    Blood Pressure (Exit) 93/59    Heart Rate (Admit) 93 bpm    Heart Rate (Exercise) 106 bpm    Heart Rate (Exit) 94 bpm    Rating of Perceived Exertion (Exercise) 12    Symptoms None    Duration Progress to 30 minutes of  aerobic without signs/symptoms of physical distress    Intensity THRR unchanged  Progression   Progression Continue to progress workloads to maintain intensity without signs/symptoms of physical distress.    Average METs 1.8      Resistance Training   Training Prescription Yes    Weight 2 lbs    Reps 10-15    Time 10 Minutes      Interval Training   Interval Training No      NuStep   Level 1    SPM 106    Minutes 25    METs 1.8             Nutrition:  Target Goals: Understanding of nutrition guidelines, daily intake of sodium 1500mg , cholesterol 200mg , calories 30% from fat and 7% or less from saturated fats, daily to have 5 or more servings of fruits and vegetables.  Biometrics:  Pre Biometrics - 11/16/23 1130       Pre Biometrics   Waist Circumference 46 inches    Hip Circumference 46 inches    Waist to Hip Ratio 1 %    Triceps Skinfold 37 mm    % Body Fat 47.3 %    Grip Strength 20 kg    Flexibility 0 in   not done, low back pain   Single Leg Stand 1.87 seconds              Nutrition Therapy Plan and Nutrition Goals:  Nutrition Therapy & Goals - 12/18/23 1022       Nutrition Therapy   Diet Heart Healthy/Carbohydrate Consistent Diet    Drug/Food Interactions --      Personal Nutrition Goals   Nutrition Goal Patient to identify strategies for reducing cardiovascular risk by attending the [facility] education and nutrition series weekly.   goal in progress.   Personal Goal #2 Patient to improve diet quality by using the plate method as a guide for meal planning to include lean  protein/plant protein, fruits, vegetables, whole grains, nonfat dairy as part of a well-balanced diet.   goal in progress.   Comments Goals in progress. Moet has medical history of DM2, hyperlipidemia, CABGx4, chronic systolic heart failure, HTN, s/p mitral valve repair. She continues to attend the [facility] education/nutrition series regularly. A1c and LDL are at goal. She has maintained her weight since starting with our program. She continues regular follow-up with endocrinology and advanced heart failure clinic. Patient will benefit from participation in intensive cardiac rehab for nutrition, exercise, and lifestyle modification.      Intervention Plan   Intervention Prescribe, educate and counsel regarding individualized specific dietary modifications aiming towards targeted core components such as weight, hypertension, lipid management, diabetes, heart failure and other comorbidities.;Nutrition handout(s) given to patient.    Expected Outcomes Short Term Goal: Understand basic principles of dietary content, such as calories, fat, sodium, cholesterol and nutrients.;Long Term Goal: Adherence to prescribed nutrition plan.             Nutrition Assessments:  MEDIFICTS Score Key: >=70 Need to make dietary changes  40-70 Heart Healthy Diet <= 40 Therapeutic Level Cholesterol Diet    Picture Your Plate Scores: <16 Unhealthy dietary pattern with much room for improvement. 41-50 Dietary pattern unlikely to meet recommendations for good health and room for improvement. 51-60 More healthful dietary pattern, with some room for improvement.  >60 Healthy dietary pattern, although there may be some specific behaviors that could be improved.    Nutrition Goals Re-Evaluation:  Nutrition Goals Re-Evaluation     Row Name 12/18/23 1022  Goals   Current Weight 186 lb 4.6 oz (84.5 kg)       Comment Cr 1.11, GFR 52, A1c 6.9, LDL 71, HDL 37, Lpa WNL       Expected Outcome Goals in  progress. Smera has medical history of DM2, hyperlipidemia, CABGx4, chronic systolic heart failure, HTN, s/p mitral valve repair. She continues to attend the [facility] education/nutrition series regularly. A1c and LDL are at goal. She has maintained her weight since starting with our program. She continues regular follow-up with endocrinology and advanced heart failure clinic. Patient will benefit from participation in intensive cardiac rehab for nutrition, exercise, and lifestyle modification.                Nutrition Goals Re-Evaluation:  Nutrition Goals Re-Evaluation     Row Name 12/18/23 1022             Goals   Current Weight 186 lb 4.6 oz (84.5 kg)       Comment Cr 1.11, GFR 52, A1c 6.9, LDL 71, HDL 37, Lpa WNL       Expected Outcome Goals in progress. Brianda has medical history of DM2, hyperlipidemia, CABGx4, chronic systolic heart failure, HTN, s/p mitral valve repair. She continues to attend the [facility] education/nutrition series regularly. A1c and LDL are at goal. She has maintained her weight since starting with our program. She continues regular follow-up with endocrinology and advanced heart failure clinic. Patient will benefit from participation in intensive cardiac rehab for nutrition, exercise, and lifestyle modification.                Nutrition Goals Discharge (Final Nutrition Goals Re-Evaluation):  Nutrition Goals Re-Evaluation - 12/18/23 1022       Goals   Current Weight 186 lb 4.6 oz (84.5 kg)    Comment Cr 1.11, GFR 52, A1c 6.9, LDL 71, HDL 37, Lpa WNL    Expected Outcome Goals in progress. Lowell has medical history of DM2, hyperlipidemia, CABGx4, chronic systolic heart failure, HTN, s/p mitral valve repair. She continues to attend the [facility] education/nutrition series regularly. A1c and LDL are at goal. She has maintained her weight since starting with our program. She continues regular follow-up with endocrinology and advanced heart failure clinic.  Patient will benefit from participation in intensive cardiac rehab for nutrition, exercise, and lifestyle modification.             Psychosocial: Target Goals: Acknowledge presence or absence of significant depression and/or stress, maximize coping skills, provide positive support system. Participant is able to verbalize types and ability to use techniques and skills needed for reducing stress and depression.  Initial Review & Psychosocial Screening:  Initial Psych Review & Screening - 11/16/23 1134       Initial Review   Current issues with None Identified      Family Dynamics   Good Support System? Yes   Shronda has her husband and children for support   Comments Charna denies any depression/ anxiety/ stress. She is still very fatigued with ADL's and shared that she is frustrated with how slow progress has been. She is ready to return to her baseline.      Barriers   Psychosocial barriers to participate in program There are no identifiable barriers or psychosocial needs.      Screening Interventions   Interventions Encouraged to exercise;Provide feedback about the scores to participant    Expected Outcomes Short Term goal: Identification and review with participant of any Quality of Life or Depression  concerns found by scoring the questionnaire.;Long Term goal: The participant improves quality of Life and PHQ9 Scores as seen by post scores and/or verbalization of changes             Quality of Life Scores:  Quality of Life - 11/16/23 1137       Quality of Life   Select Quality of Life      Quality of Life Scores   Health/Function Pre 19 %    Socioeconomic Pre 29 %    Psych/Spiritual Pre 28.29 %    Family Pre 28.8 %    GLOBAL Pre 24.8 %            Scores of 19 and below usually indicate a poorer quality of life in these areas.  A difference of  2-3 points is a clinically meaningful difference.  A difference of 2-3 points in the total score of the Quality of Life  Index has been associated with significant improvement in overall quality of life, self-image, physical symptoms, and general health in studies assessing change in quality of life.  PHQ-9: Review Flowsheet       11/16/2023 03/24/2016  Depression screen PHQ 2/9  Decreased Interest 0 0  Down, Depressed, Hopeless 0 0  PHQ - 2 Score 0 0  Altered sleeping 0 -  Tired, decreased energy 0 -  Change in appetite 0 -  Feeling bad or failure about yourself  0 -  Trouble concentrating 0 -  Moving slowly or fidgety/restless 0 -  Suicidal thoughts 0 -  PHQ-9 Score 0 -  Difficult doing work/chores Not difficult at all -   Interpretation of Total Score  Total Score Depression Severity:  1-4 = Minimal depression, 5-9 = Mild depression, 10-14 = Moderate depression, 15-19 = Moderately severe depression, 20-27 = Severe depression   Psychosocial Evaluation and Intervention:   Psychosocial Re-Evaluation:  Psychosocial Re-Evaluation     Row Name 11/22/23 1132 12/18/23 1707           Psychosocial Re-Evaluation   Current issues with None Identified None Identified      Comments Shree did not voice any increased concerns or stressors on her first day of exercise. Shabria has returned to work . Valetta has not voiced any increased concerns or stressors during exercise.      Continue Psychosocial Services  Follow up required by staff Follow up required by staff               Psychosocial Discharge (Final Psychosocial Re-Evaluation):  Psychosocial Re-Evaluation - 12/18/23 1707       Psychosocial Re-Evaluation   Current issues with None Identified    Comments Teegan has returned to work . Dawna has not voiced any increased concerns or stressors during exercise.    Continue Psychosocial Services  Follow up required by staff             Vocational Rehabilitation: Provide vocational rehab assistance to qualifying candidates.   Vocational Rehab Evaluation & Intervention:  Vocational Rehab  - 11/16/23 1138       Initial Vocational Rehab Evaluation & Intervention   Assessment shows need for Vocational Rehabilitation No   Joana is working            Education: Education Goals: Education classes will be provided on a weekly basis, covering required topics. Participant will state understanding/return demonstration of topics presented.    Education     Row Name 11/24/23 1100     Education  Cardiac Education Topics [facility]   Select Core Videos     Core Videos   Educator Exercise Industrial/product designer Education   General Education Heart Disease Risk Reduction   Instruction Review Code 1- Verbalizes Understanding   Class Start Time 1145   Class Stop Time 1230   Class Time Calculation (min) 45 min    Row Name 11/27/23 1100     Education   Cardiac Education Topics [facility]   Select Workshops     Workshops   Educator Exercise Physiologist   Select Exercise   Exercise Workshop Location manager and Fall Prevention   Instruction Review Code 1- Verbalizes Understanding   Class Start Time 1142   Class Stop Time 1224   Class Time Calculation (min) 42 min    Row Name 11/29/23 1000     Education   Cardiac Education Topics [facility]   Customer service manager   Weekly Topic Adding Flavor - Sodium-Free   Instruction Review Code 1- Verbalizes Understanding   Class Start Time 1145   Class Stop Time 1225   Class Time Calculation (min) 40 min    Row Name 12/08/23 1300     Education   Cardiac Education Topics [facility]in   Licensed conveyancer Nutrition   Nutrition Other  Label Reading   Instruction Review Code 1- Verbalizes Understanding   Class Start Time 1152   Class Stop Time 1233   Class Time Calculation (min) 41 min    Row Name 12/11/23 1100     Education   Cardiac Education Topics [facility]in   Actor Nurse    Select Nutrition   Nutrition Becoming a [facility]rit[facility]in Chef   Instruction Review Code 1- Verbalizes Understanding   Class Start Time 1150   Class Stop Time 1226   Class Time Calculation (min) 36 min    Row Name 12/13/23 1300     Education   Cardiac Education Topics [facility]   Secondary school teacher School   Educator Dietitian;Nurse   Weekly Topic Personalizing Your [facility]ritikin Plate   Instruction Review Code 1- Verbalizes Understanding   Class Start Time 1149   Class Stop Time 1233   Class Time Calculation (min) 44 min    Row Name 12/15/23 1400     Education   Cardiac Education Topics [facility]in   Glass blower/designer Nutrition   Nutrition Workshop Label Reading   Instruction Review Code 1- Tax inspector   Class Start Time 1145   Class Stop Time 1230   Class Time Calculation (min) 45 min            Core Videos: Exercise    Move It!  Clinical staff conducted group or individual video education with verbal and written material and guidebook.  Patient learns the recommended [facility] exercise program. Exercise with the goal of living a long, healthy life. Some of the health benefits of exercise include controlled diabetes, healthier blood pressure levels, improved cholesterol levels, improved heart and lung capacity, improved sleep, and better body composition. Everyone should speak with their doctor before starting or changing an exercise routine.  Biomechanical Limitations Clinical staff conducted group or individual video education with verbal and written material and guidebook.  Patient  learns how biomechanical limitations can impact exercise and how we can mitigate and possibly overcome limitations to have an impactful and balanced exercise routine.  Body Composition Clinical staff conducted group or individual video education with verbal and written material and guidebook.  Patient learns that body composition  (ratio of muscle mass to fat mass) is a key component to assessing overall fitness, rather than body weight alone. Increased fat mass, especially visceral belly fat, can put [place] at increased risk for metabolic syndrome, type 2 diabetes, heart disease, and even death. It is recommended to combine diet and exercise (cardiovascular and resistance training) to improve your body composition. Seek guidance from your physician and exercise physiologist before implementing an exercise routine.  Exercise Action Plan Clinical staff conducted group or individual video education with verbal and written material and guidebook.  Patient learns the recommended strategies to achieve and enjoy long-term exercise adherence, including variety, self-motivation, self-efficacy, and positive decision making. Benefits of exercise include fitness, good health, weight management, more energy, better sleep, less stress, and overall well-being.  Medical   Heart Disease Risk Reduction Clinical staff conducted group or individual video education with verbal and written material and guidebook.  Patient learns our heart is our most vital organ as it circulates oxygen, nutrients, white blood cells, and hormones throughout the entire body, and carries waste away. Data supports a plant-based eating plan like the Pritikin Program for its effectiveness in slowing progression of and reversing heart disease. The video provides a number of recommendations to address heart disease.   Metabolic Syndrome and Belly Fat  Clinical staff conducted group or individual video education with verbal and written material and guidebook.  Patient learns what metabolic syndrome is, how it leads to heart disease, and how one can reverse it and keep it from coming back. You have metabolic syndrome if you have 3 of the following 5 criteria: abdominal obesity, high blood pressure, high triglycerides, low HDL cholesterol, and high blood sugar.  Hypertension and  Heart Disease Clinical staff conducted group or individual video education with verbal and written material and guidebook.  Patient learns that high blood pressure, or hypertension, is very common in the [place]. Hypertension is largely due to excessive salt intake, but other important risk factors include being overweight, physical inactivity, drinking too much alcohol, smoking, and not eating enough potassium from fruits and vegetables. High blood pressure is a leading risk factor for heart attack, stroke, congestive heart failure, dementia, kidney failure, and premature death. Long-term effects of excessive salt intake include stiffening of the arteries and thickening of heart muscle and organ damage. Recommendations include ways to reduce hypertension and the risk of heart disease.  Diseases of Our Time - Focusing on Diabetes Clinical staff conducted group or individual video education with verbal and written material and guidebook.  Patient learns why the best way to stop diseases of our time is prevention, through food and other lifestyle changes. Medicine (such as prescription pills and surgeries) is often only a Band-Aid on the problem, not a long-term solution. Most common diseases of our time include obesity, type 2 diabetes, hypertension, heart disease, and cancer. The Pritikin Program is recommended and has been proven to help reduce, reverse, and/or prevent the damaging effects of metabolic syndrome.  Nutrition   Overview of the Pritikin Eating Plan  Clinical staff conducted group or individual video education with verbal and written material and guidebook.  Patient learns about the Pritikin Eating Plan for disease risk reduction. The  Pritikin Eating Plan emphasizes a wide variety of unrefined, minimally-processed carbohydrates, like fruits, vegetables, whole grains, and legumes. Go, Caution, and Stop food choices are explained. Plant-based and lean animal proteins are emphasized.  Rationale provided for low sodium intake for blood pressure control, low added sugars for blood sugar stabilization, and low added fats and oils for coronary artery disease risk reduction and weight management.  Calorie Density  Clinical staff conducted group or individual video education with verbal and written material and guidebook.  Patient learns about calorie density and how it impacts the Pritikin Eating Plan. Knowing the characteristics of the food you choose will help you decide whether those foods will lead to weight gain or weight loss, and whether you want to consume more or less of them. Weight loss is usually a side effect of the Pritikin Eating Plan because of its focus on low calorie-dense foods.  Label Reading  Clinical staff conducted group or individual video education with verbal and written material and guidebook.  Patient learns about the Pritikin recommended label reading guidelines and corresponding recommendations regarding calorie density, added sugars, sodium content, and whole grains.  Dining Out - Part 1  Clinical staff conducted group or individual video education with verbal and written material and guidebook.  Patient learns that restaurant meals can be sabotaging because they can be so high in calories, fat, sodium, and/or sugar. Patient learns recommended strategies on how to positively address this and avoid unhealthy pitfalls.  Facts on Fats  Clinical staff conducted group or individual video education with verbal and written material and guidebook.  Patient learns that lifestyle modifications can be just as effective, if not more so, as many medications for lowering your risk of heart disease. A Pritikin lifestyle can help to reduce your risk of inflammation and atherosclerosis (cholesterol build-up, or plaque, in the artery walls). Lifestyle interventions such as dietary choices and physical activity address the cause of atherosclerosis. A review of the types of  fats and their impact on blood cholesterol levels, along with dietary recommendations to reduce fat intake is also included.  Nutrition Action Plan  Clinical staff conducted group or individual video education with verbal and written material and guidebook.  Patient learns how to incorporate Pritikin recommendations into their lifestyle. Recommendations include planning and keeping personal health goals in mind as an important part of their success.  Healthy Mind-Set    Healthy Minds, Bodies, Hearts  Clinical staff conducted group or individual video education with verbal and written material and guidebook.  Patient learns how to identify when they are stressed. Video will discuss the impact of that stress, as well as the many benefits of stress management. Patient will also be introduced to stress management techniques. The way we think, act, and feel has an impact on our hearts.  How Our Thoughts Can Heal Our Hearts  Clinical staff conducted group or individual video education with verbal and written material and guidebook.  Patient learns that negative thoughts can cause depression and anxiety. This can result in negative lifestyle behavior and serious health problems. Cognitive behavioral therapy is an effective method to help control our thoughts in order to change and improve our emotional outlook.  Additional Videos:  Exercise    Improving Performance  Clinical staff conducted group or individual video education with verbal and written material and guidebook.  Patient learns to use a non-linear approach by alternating intensity levels and lengths of time spent exercising to help burn more calories and lose more  body fat. Cardiovascular exercise helps improve heart health, metabolism, hormonal balance, blood sugar control, and recovery from fatigue. Resistance training improves strength, endurance, balance, coordination, reaction time, metabolism, and muscle mass. Flexibility exercise  improves circulation, posture, and balance. Seek guidance from your physician and exercise physiologist before implementing an exercise routine and learn your capabilities and proper form for all exercise.  Introduction to Yoga  Clinical staff conducted group or individual video education with verbal and written material and guidebook.  Patient learns about yoga, a discipline of the coming together of mind, breath, and body. The benefits of yoga include improved flexibility, improved range of motion, better posture and core strength, increased lung function, weight loss, and positive self-image. Yoga's heart health benefits include lowered blood pressure, healthier heart rate, decreased cholesterol and triglyceride levels, improved immune function, and reduced stress. Seek guidance from your physician and exercise physiologist before implementing an exercise routine and learn your capabilities and proper form for all exercise.  Medical   Aging: Enhancing Your Quality of Life  Clinical staff conducted group or individual video education with verbal and written material and guidebook.  Patient learns key strategies and recommendations to stay in good physical health and enhance quality of life, such as prevention strategies, having an advocate, securing a Health Care Proxy and Power of Attorney, and keeping a list of medications and system for tracking them. It also discusses how to avoid risk for bone loss.  Biology of Weight Control  Clinical staff conducted group or individual video education with verbal and written material and guidebook.  Patient learns that weight gain occurs because we consume more calories than we burn (eating more, moving less). Even if your body weight is normal, you may have higher ratios of fat compared to muscle mass. Too much body fat puts you at increased risk for cardiovascular disease, heart attack, stroke, type 2 diabetes, and obesity-related cancers. In addition to  exercise, following the Pritikin Eating Plan can help reduce your risk.  Decoding Lab Results  Clinical staff conducted group or individual video education with verbal and written material and guidebook.  Patient learns that lab test reflects one measurement whose values change over time and are influenced by many factors, including medication, stress, sleep, exercise, food, hydration, pre-existing medical conditions, and more. It is recommended to use the knowledge from this video to become more involved with your lab results and evaluate your numbers to speak with your doctor.   Diseases of Our Time - Overview  Clinical staff conducted group or individual video education with verbal and written material and guidebook.  Patient learns that according to the CDC, 50% to 70% of chronic diseases (such as obesity, type 2 diabetes, elevated lipids, hypertension, and heart disease) are avoidable through lifestyle improvements including healthier food choices, listening to satiety cues, and increased physical activity.  Sleep Disorders Clinical staff conducted group or individual video education with verbal and written material and guidebook.  Patient learns how good quality and duration of sleep are important to overall health and well-being. Patient also learns about sleep disorders and how they impact health along with recommendations to address them, including discussing with a physician.  Nutrition  Dining Out - Part 2 Clinical staff conducted group or individual video education with verbal and written material and guidebook.  Patient learns how to plan ahead and communicate in order to maximize their dining experience in a healthy and nutritious manner. Included are recommended food choices based on the type of  restaurant the patient is visiting.   Fueling a Banker conducted group or individual video education with verbal and written material and guidebook.  There is a  strong connection between our food choices and our health. Diseases like obesity and type 2 diabetes are very prevalent and are in large-part due to lifestyle choices. The Pritikin Eating Plan provides plenty of food and hunger-curbing satisfaction. It is easy to follow, affordable, and helps reduce health risks.  Menu Workshop  Clinical staff conducted group or individual video education with verbal and written material and guidebook.  Patient learns that restaurant meals can sabotage health goals because they are often packed with calories, fat, sodium, and sugar. Recommendations include strategies to plan ahead and to communicate with the manager, chef, or server to help order a healthier meal.  Planning Your Eating Strategy  Clinical staff conducted group or individual video education with verbal and written material and guidebook.  Patient learns about the Pritikin Eating Plan and its benefit of reducing the risk of disease. The Pritikin Eating Plan does not focus on calories. Instead, it emphasizes high-quality, nutrient-rich foods. By knowing the characteristics of the foods, we choose, we can determine their calorie density and make informed decisions.  Targeting Your Nutrition Priorities  Clinical staff conducted group or individual video education with verbal and written material and guidebook.  Patient learns that lifestyle habits have a tremendous impact on disease risk and progression. This video provides eating and physical activity recommendations based on your personal health goals, such as reducing LDL cholesterol, losing weight, preventing or controlling type 2 diabetes, and reducing high blood pressure.  Vitamins and Minerals  Clinical staff conducted group or individual video education with verbal and written material and guidebook.  Patient learns different ways to obtain key vitamins and minerals, including through a recommended healthy diet. It is important to discuss all  supplements you take with your doctor.   Healthy Mind-Set    Smoking Cessation  Clinical staff conducted group or individual video education with verbal and written material and guidebook.  Patient learns that cigarette smoking and tobacco addiction pose a serious health risk which affects millions of people. Stopping smoking will significantly reduce the risk of heart disease, lung disease, and many forms of cancer. Recommended strategies for quitting are covered, including working with your doctor to develop a successful plan.  Culinary   Becoming a Set designer conducted group or individual video education with verbal and written material and guidebook.  Patient learns that cooking at home can be healthy, cost-effective, quick, and puts them in control. Keys to cooking healthy recipes will include looking at your recipe, assessing your equipment needs, planning ahead, making it simple, choosing cost-effective seasonal ingredients, and limiting the use of added fats, salts, and sugars.  Cooking - Breakfast and Snacks  Clinical staff conducted group or individual video education with verbal and written material and guidebook.  Patient learns how important breakfast is to satiety and nutrition through the entire day. Recommendations include key foods to eat during breakfast to help stabilize blood sugar levels and to prevent overeating at meals later in the day. Planning ahead is also a key component.  Cooking - Educational psychologist conducted group or individual video education with verbal and written material and guidebook.  Patient learns eating strategies to improve overall health, including an approach to cook more at home. Recommendations include thinking of animal protein as a side  on your plate rather than center stage and focusing instead on lower calorie dense options like vegetables, fruits, whole grains, and plant-based proteins, such as beans. Making sauces  in large quantities to freeze for later and leaving the skin on your vegetables are also recommended to maximize your experience.  Cooking - Healthy Salads and Dressing Clinical staff conducted group or individual video education with verbal and written material and guidebook.  Patient learns that vegetables, fruits, whole grains, and legumes are the foundations of the Pritikin Eating Plan. Recommendations include how to incorporate each of these in flavorful and healthy salads, and how to create homemade salad dressings. Proper handling of ingredients is also covered. Cooking - Soups and [employer] - Soups and Desserts Clinical staff conducted group or individual video education with verbal and written material and guidebook.  Patient learns that Pritikin soups and desserts make for easy, nutritious, and delicious snacks and meal components that are low in sodium, fat, sugar, and calorie density, while high in vitamins, minerals, and filling fiber. Recommendations include simple and healthy ideas for soups and desserts.   Overview     The Pritikin Solution Program Overview Clinical staff conducted group or individual video education with verbal and written material and guidebook.  Patient learns that the results of the Pritikin Program have been documented in more than 100 articles published in peer-reviewed journals, and the benefits include reducing risk factors for (and, in some cases, even reversing) high cholesterol, high blood pressure, type 2 diabetes, obesity, and more! An overview of the three key pillars of the Pritikin Program will be covered: eating well, doing regular exercise, and having a healthy mind-set.  WORKSHOPS  Exercise: Exercise Basics: Building Your Action Plan Clinical staff led group instruction and group discussion with PowerPoint presentation and patient guidebook. To enhance the learning environment the use of posters, models and videos may be added. At the  conclusion of this workshop, patients will comprehend the difference between physical activity and exercise, as well as the benefits of incorporating both, into their routine. Patients will understand the FITT (Frequency, Intensity, Time, and Type) principle and how to use it to build an exercise action plan. In addition, safety concerns and other considerations for exercise and cardiac rehab will be addressed by the presenter. The purpose of this lesson is to promote a comprehensive and effective weekly exercise routine in order to improve patients' overall level of fitness.   Managing Heart Disease: Your Path to a Healthier Heart Clinical staff led group instruction and group discussion with PowerPoint presentation and patient guidebook. To enhance the learning environment the use of posters, models and videos may be added.At the conclusion of this workshop, patients will understand the anatomy and physiology of the heart. Additionally, they will understand how Pritikin's three pillars impact the risk factors, the progression, and the management of heart disease.  The purpose of this lesson is to provide a high-level overview of the heart, heart disease, and how the Pritikin lifestyle positively impacts risk factors.  Exercise Biomechanics Clinical staff led group instruction and group discussion with PowerPoint presentation and patient guidebook. To enhance the learning environment the use of posters, models and videos may be added. Patients will learn how the structural parts of their bodies function and how these functions impact their daily activities, movement, and exercise. Patients will learn how to promote a neutral spine, learn how to manage pain, and identify ways to improve their physical movement in order to promote healthy  living. The purpose of this lesson is to expose patients to common physical limitations that impact physical activity. Participants will learn practical ways  to adapt and manage aches and pains, and to minimize their effect on regular exercise. Patients will learn how to maintain good posture while sitting, walking, and lifting.  Balance Training and Fall Prevention  Clinical staff led group instruction and group discussion with PowerPoint presentation and patient guidebook. To enhance the learning environment the use of posters, models and videos may be added. At the conclusion of this workshop, patients will understand the importance of their sensorimotor skills (vision, proprioception, and the vestibular system) in maintaining their ability to balance as they age. Patients will apply a variety of balancing exercises that are appropriate for their current level of function. Patients will understand the common causes for poor balance, possible solutions to these problems, and ways to modify their physical environment in order to minimize their fall risk. The purpose of this lesson is to teach patients about the importance of maintaining balance as they age and ways to minimize their risk of falling.  WORKSHOPS   Nutrition:  Fueling a Ship broker led group instruction and group discussion with PowerPoint presentation and patient guidebook. To enhance the learning environment the use of posters, models and videos may be added. Patients will review the foundational principles of the Pritikin Eating Plan and understand what constitutes a serving size in each of the food groups. Patients will also learn Pritikin-friendly foods that are better choices when away from home and review make-ahead meal and snack options. Calorie density will be reviewed and applied to three nutrition priorities: weight maintenance, weight loss, and weight gain. The purpose of this lesson is to reinforce (in a group setting) the key concepts around what patients are recommended to eat and how to apply these guidelines when away from home by planning and selecting  Pritikin-friendly options. Patients will understand how calorie density may be adjusted for different weight management goals.  Mindful Eating  Clinical staff led group instruction and group discussion with PowerPoint presentation and patient guidebook. To enhance the learning environment the use of posters, models and videos may be added. Patients will briefly review the concepts of the Pritikin Eating Plan and the importance of low-calorie dense foods. The concept of mindful eating will be introduced as well as the importance of paying attention to internal hunger signals. Triggers for non-hunger eating and techniques for dealing with triggers will be explored. The purpose of this lesson is to provide patients with the opportunity to review the basic principles of the Pritikin Eating Plan, discuss the value of eating mindfully and how to measure internal cues of hunger and fullness using the Hunger Scale. Patients will also discuss reasons for non-hunger eating and learn strategies to use for controlling emotional eating.  Targeting Your Nutrition Priorities Clinical staff led group instruction and group discussion with PowerPoint presentation and patient guidebook. To enhance the learning environment the use of posters, models and videos may be added. Patients will learn how to determine their genetic susceptibility to disease by reviewing their family history. Patients will gain insight into the importance of diet as part of an overall healthy lifestyle in mitigating the impact of genetics and other environmental insults. The purpose of this lesson is to provide patients with the opportunity to assess their personal nutrition priorities by looking at their family history, their own health history and current risk factors. Patients will also be able  to discuss ways of prioritizing and modifying the Pritikin Eating Plan for their highest risk areas  Menu  Clinical staff led group instruction and group  discussion with PowerPoint presentation and patient guidebook. To enhance the learning environment the use of posters, models and videos may be added. Using menus brought in from [employer], or printed from [employer], patients will apply the Pritikin dining out guidelines that were presented in the [employer] video. Patients will also be able to practice these guidelines in a variety of provided scenarios. The purpose of this lesson is to provide patients with the opportunity to practice hands-on learning of the Pritikin Dining Out guidelines with actual menus and practice scenarios.  Label Reading Clinical staff led group instruction and group discussion with PowerPoint presentation and patient guidebook. To enhance the learning environment the use of posters, models and videos may be added. Patients will review and discuss the Pritikin label reading guidelines presented in Pritikin's Label Reading Educational series video. Using fool labels brought in from local grocery stores and markets, patients will apply the label reading guidelines and determine if the packaged food meet the Pritikin guidelines. The purpose of this lesson is to provide patients with the opportunity to review, discuss, and practice hands-on learning of the Pritikin Label Reading guidelines with actual packaged food labels. Cooking School  Pritikin's [employer] are designed to teach patients ways to prepare quick, simple, and affordable recipes at home. The importance of nutrition's role in chronic disease risk reduction is reflected in its emphasis in the overall Pritikin program. By learning how to prepare essential core Pritikin Eating Plan recipes, patients will increase control over what they eat; be able to customize the flavor of foods without the use of added salt, sugar, or fat; and improve the quality of the food they consume. By learning a set of core recipes which are easily  assembled, quickly prepared, and affordable, patients are more likely to prepare more healthy foods at home. These workshops focus on convenient breakfasts, simple entres, side dishes, and desserts which can be prepared with minimal effort and are consistent with nutrition recommendations for cardiovascular risk reduction. Cooking [employer] are taught by a Armed forces logistics/support/administrative officer (RD) who has been trained by the [employer]. The chef or RD has a clear understanding of the importance of minimizing - if not completely eliminating - added fat, sugar, and sodium in recipes. Throughout the series of Cooking School Workshop sessions, patients will learn about healthy ingredients and efficient methods of cooking to build confidence in their capability to prepare    Cooking School weekly topics:  Adding Flavor- Sodium-Free  Fast and Healthy Breakfasts  Powerhouse Plant-Based Proteins  Satisfying Salads and Dressings  Simple Sides and Sauces  International Cuisine-Spotlight on the [employer] Zones  Delicious Desserts  Savory Soups  [employer] - Meals in a Astronomer Appetizers and Snacks  Comforting Weekend Breakfasts  One-Pot Wonders   Fast Evening Meals  Landscape architect Your Pritikin Plate  WORKSHOPS   Healthy Mindset (Psychosocial):  Focused Goals, Sustainable Changes Clinical staff led group instruction and group discussion with PowerPoint presentation and patient guidebook. To enhance the learning environment the use of posters, models and videos may be added. Patients will be able to apply effective goal setting strategies to establish at least one personal goal, and then take consistent, meaningful action toward that goal. They will learn to identify common barriers to achieving  personal goals and develop strategies to overcome them. Patients will also gain an understanding of how our mind-set can impact our ability to achieve goals and the  importance of cultivating a positive and growth-oriented mind-set. The purpose of this lesson is to provide patients with a deeper understanding of how to set and achieve personal goals, as well as the tools and strategies needed to overcome common obstacles which may arise along the way.  From Head to Heart: The Power of a Healthy Outlook  Clinical staff led group instruction and group discussion with PowerPoint presentation and patient guidebook. To enhance the learning environment the use of posters, models and videos may be added. Patients will be able to recognize and describe the impact of emotions and mood on physical health. They will discover the importance of self-care and explore self-care practices which may work for them. Patients will also learn how to utilize the 4 C's to cultivate a healthier outlook and better manage stress and challenges. The purpose of this lesson is to demonstrate to patients how a healthy outlook is an essential part of maintaining good health, especially as they continue their cardiac rehab journey.  Healthy Sleep for a Healthy Heart Clinical staff led group instruction and group discussion with PowerPoint presentation and patient guidebook. To enhance the learning environment the use of posters, models and videos may be added. At the conclusion of this workshop, patients will be able to demonstrate knowledge of the importance of sleep to overall health, well-being, and quality of life. They will understand the symptoms of, and treatments for, common sleep disorders. Patients will also be able to identify daytime and nighttime behaviors which impact sleep, and they will be able to apply these tools to help manage sleep-related challenges. The purpose of this lesson is to provide patients with a general overview of sleep and outline the importance of quality sleep. Patients will learn about a few of the most common sleep disorders. Patients will also be introduced to the  concept of "sleep hygiene," and discover ways to self-manage certain sleeping problems through simple daily behavior changes. Finally, the workshop will motivate patients by clarifying the links between quality sleep and their goals of heart-healthy living.   Recognizing and Reducing Stress Clinical staff led group instruction and group discussion with PowerPoint presentation and patient guidebook. To enhance the learning environment the use of posters, models and videos may be added. At the conclusion of this workshop, patients will be able to understand the types of stress reactions, differentiate between acute and chronic stress, and recognize the impact that chronic stress has on their health. They will also be able to apply different coping mechanisms, such as reframing negative self-talk. Patients will have the opportunity to practice a variety of stress management techniques, such as deep abdominal breathing, progressive muscle relaxation, and/or guided imagery.  The purpose of this lesson is to educate patients on the role of stress in their lives and to provide healthy techniques for coping with it.  Learning Barriers/Preferences:  Learning Barriers/Preferences - 11/16/23 1137       Learning Barriers/Preferences   Learning Barriers Sight   wears glasses   Learning Preferences Audio;Computer/Internet;Group Instruction;Individual Instruction;Pictoral;Skilled Demonstration;Video;Verbal Instruction;Written Material             Education Topics:  Knowledge Questionnaire Score:  Knowledge Questionnaire Score - 11/16/23 1138       Knowledge Questionnaire Score   Pre Score 18/24  Core Components/Risk Factors/Patient Goals at Admission:  Personal Goals and Risk Factors at Admission - 11/16/23 1138       Core Components/Risk Factors/Patient Goals on Admission    Weight Management Yes;Obesity    Intervention Weight Management: Provide education and appropriate  resources to help participant work on and attain dietary goals.;Weight Management/Obesity: Establish reasonable short term and long term weight goals.;Obesity: Provide education and appropriate resources to help participant work on and attain dietary goals.;Weight Management: Develop a combined nutrition and exercise program designed to reach desired caloric intake, while maintaining appropriate intake of nutrient and fiber, sodium and fats, and appropriate energy expenditure required for the weight goal.    Expected Outcomes Short Term: Continue to assess and modify interventions until short term weight is achieved;Long Term: Adherence to nutrition and physical activity/exercise program aimed toward attainment of established weight goal;Understanding recommendations for meals to include 15-35% energy as protein, 25-35% energy from fat, 35-60% energy from carbohydrates, less than 200mg  of dietary cholesterol, 20-35 gm of total fiber daily;Understanding of distribution of calorie intake throughout the day with the consumption of 4-5 meals/snacks    Diabetes Yes    Intervention Provide education about signs/symptoms and action to take for hypo/hyperglycemia.;Provide education about proper nutrition, including hydration, and aerobic/resistive exercise prescription along with prescribed medications to achieve blood glucose in normal ranges: Fasting glucose 65-99 mg/dL    Expected Outcomes Short Term: Participant verbalizes understanding of the signs/symptoms and immediate care of hyper/hypoglycemia, proper foot care and importance of medication, aerobic/resistive exercise and nutrition plan for blood glucose control.;Long Term: Attainment of HbA1C < 7%.    Heart Failure Yes    Intervention Provide a combined exercise and nutrition program that is supplemented with education, support and counseling about heart failure. Directed toward relieving symptoms such as shortness of breath, decreased exercise tolerance, and  extremity edema.    Expected Outcomes Improve functional capacity of life;Short term: Attendance in program 2-3 days a week with increased exercise capacity. Reported lower sodium intake. Reported increased fruit and vegetable intake. Reports medication compliance.;Short term: Daily weights obtained and reported for increase. Utilizing diuretic protocols set by physician.;Long term: Adoption of self-care skills and reduction of barriers for early signs and symptoms recognition and intervention leading to self-care maintenance.    Hypertension Yes    Intervention Provide education on lifestyle modifcations including regular physical activity/exercise, weight management, moderate sodium restriction and increased consumption of fresh fruit, vegetables, and low fat dairy, alcohol moderation, and smoking cessation.;Monitor prescription use compliance.    Expected Outcomes Short Term: Continued assessment and intervention until BP is < 140/52mm HG in hypertensive participants. < 130/36mm HG in hypertensive participants with diabetes, heart failure or chronic kidney disease.;Long Term: Maintenance of blood pressure at goal levels.    Lipids Yes    Intervention Provide education and support for participant on nutrition & aerobic/resistive exercise along with prescribed medications to achieve LDL 70mg , HDL >40mg .    Expected Outcomes Short Term: Participant states understanding of desired cholesterol values and is compliant with medications prescribed. Participant is following exercise prescription and nutrition guidelines.;Long Term: Cholesterol controlled with medications as prescribed, with individualized exercise RX and with personalized nutrition plan. Value goals: LDL < 70mg , HDL > 40 mg.             Core Components/Risk Factors/Patient Goals Review:   Goals and Risk Factor Review     Row Name 11/22/23 1429 12/18/23 1710  Core Components/Risk Factors/Patient Goals Review   Personal  Goals Review Weight Management/Obesity;Lipids;Heart Failure;Hypertension;Diabetes Weight Management/Obesity;Lipids;Heart Failure;Hypertension;Diabetes      Review Madinah started cardiac rehab on 11/22/23. Nohelia did well with well with exercise. Nonna is somewhat deconditioned. Vital signs and CBg's were stable. Seryna is doing well with exercise at cardiac rehab. Vital signs and CBg's have been stable. Jevaeh has had some adjustments with her insulin dose due to drop in CBG's post exercise.      Expected Outcomes Ramina will continue to participate in cardiac rehab for exercise, nutrition and lifstyle modifications Kameran will continue to participate in cardiac rehab for exercise, nutrition and lifstyle modifications               Core Components/Risk Factors/Patient Goals at Discharge (Final Review):   Goals and Risk Factor Review - 12/18/23 1710       Core Components/Risk Factors/Patient Goals Review   Personal Goals Review Weight Management/Obesity;Lipids;Heart Failure;Hypertension;Diabetes    Review Tamsyn is doing well with exercise at cardiac rehab. Vital signs and CBg's have been stable. Lameka has had some adjustments with her insulin dose due to drop in CBG's post exercise.    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 Introduction to pritikin education/intensive cardiac rehab. Initial orientation packet reviewed with patient. 30 day ITP Review. Dashayla started cardiac rehab on 11/22/23. Juanice did well with exercise. 30 day ITP Review. Denni has good attendance and participation with exercise at cardiac rehab. [place] will be absent this week due to cold symptoms.              Comments: See ITP Comments

## 2023-12-20 ENCOUNTER — Encounter (HOSPITAL_COMMUNITY): Payer: Medicare HMO

## 2023-12-22 ENCOUNTER — Encounter (HOSPITAL_COMMUNITY): Payer: Medicare HMO

## 2023-12-22 ENCOUNTER — Other Ambulatory Visit: Payer: Self-pay

## 2023-12-22 DIAGNOSIS — Z951 Presence of aortocoronary bypass graft: Secondary | ICD-10-CM

## 2023-12-22 NOTE — Progress Notes (Signed)
 Patient contacted the office again today with same complaints of swelling in the lower part of her sternal incision. She noticed this yesterday. She states that there is a scab there and it is swollen. Made an appointment with Dr. Dorris Fetch with chest xray for follow-up. Patient aware of appointment date/time.

## 2023-12-25 ENCOUNTER — Encounter (HOSPITAL_COMMUNITY)
Admission: RE | Admit: 2023-12-25 | Discharge: 2023-12-25 | Disposition: A | Discharge status: Home / Self Care | Payer: Medicare HMO | Source: Ambulatory Visit | Attending: Internal Medicine | Admitting: Internal Medicine

## 2023-12-25 DIAGNOSIS — E162 Hypoglycemia, unspecified: Secondary | ICD-10-CM | POA: Diagnosis present

## 2023-12-25 DIAGNOSIS — Z951 Presence of aortocoronary bypass graft: Secondary | ICD-10-CM | POA: Insufficient documentation

## 2023-12-25 DIAGNOSIS — Z9889 Other specified postprocedural states: Secondary | ICD-10-CM | POA: Diagnosis present

## 2023-12-25 NOTE — Progress Notes (Signed)
 Diana [name] returned to exercise at cardiac rehab after being absent with the flu. Diana [name] exercised at a lighter workload without difficulty or complaints. Diana [name] said she took the last dose of her Augmentin today.Diana Headings RN BSN

## 2023-12-26 ENCOUNTER — Ambulatory Visit
Admission: RE | Admit: 2023-12-26 | Discharge: 2023-12-26 | Disposition: A | Discharge status: Home / Self Care | Source: Ambulatory Visit | Attending: Thoracic Surgery (Cardiothoracic Vascular Surgery) | Admitting: Thoracic Surgery (Cardiothoracic Vascular Surgery)

## 2023-12-26 ENCOUNTER — Other Ambulatory Visit: Payer: Self-pay | Admitting: *Deleted

## 2023-12-26 ENCOUNTER — Ambulatory Visit: Admitting: Thoracic Surgery (Cardiothoracic Vascular Surgery)

## 2023-12-26 ENCOUNTER — Telehealth (HOSPITAL_COMMUNITY): Payer: Self-pay | Admitting: Cardiology

## 2023-12-26 ENCOUNTER — Encounter: Payer: Self-pay | Admitting: Thoracic Surgery (Cardiothoracic Vascular Surgery)

## 2023-12-26 VITALS — BP 115/74 | HR 98 | Resp 18 | Ht 64.0 in | Wt 182.0 lb

## 2023-12-26 DIAGNOSIS — Z5189 Encounter for other specified aftercare: Secondary | ICD-10-CM | POA: Diagnosis not present

## 2023-12-26 DIAGNOSIS — Z951 Presence of aortocoronary bypass graft: Secondary | ICD-10-CM

## 2023-12-26 DIAGNOSIS — Z9889 Other specified postprocedural states: Secondary | ICD-10-CM | POA: Diagnosis not present

## 2023-12-26 DIAGNOSIS — I97641 Postprocedural seroma of a circulatory system organ or structure following cardiac bypass: Secondary | ICD-10-CM

## 2023-12-26 NOTE — Telephone Encounter (Signed)
 Patient left VM on triage line  Asking if amio should be discontinued     LMOM

## 2023-12-26 NOTE — H&P (View-Only) (Signed)
 [address]       [name] [address]             [phone]     HPI: [name] returns with recurrent swelling and drainage of her sternal wound.  Diana [name] is a 78 year old woman with a history of CAD, severe mitral regurgitation, pulmonary hypertension, heart failure, hypertension, hyperlipidemia, type 2 diabetes, and obesity.  Presented with chest pain and shortness of breath last fall.  Underwent coronary bypass grafting x 4 and mitral valve repair on 09/06/2023.  Postoperatively she had atrial fibrillation and went home on amiodarone and Eliquis.  I saw her in February with swelling and drainage from the inferior aspect of her sternal incision.  Chest x-ray showed that the lower to sternal wires had pulled through.  I did an I&D in the office and she treated with saline wet-to-dry dressing changes.  It healed up over the course of about 10 days.  Last week she again noted some drainage and then again developed an area of fluctuance on the lower portion of the sternal wound.  No fevers or chills.  Daughter saw the drainage, it was clear serous appearing.  Not purulent.  Past Medical History:  Diagnosis Date   CAD (coronary artery disease) 7/12-8/12   staged LAD/RCA DES   Diabetes mellitus (HCC)    Diastolic dysfunction 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    Dyslipidemia    HTN (hypertension)    Past Surgical History:  Procedure Laterality Date   CENTRAL LINE INSERTION  08/28/2023   Procedure: CENTRAL LINE INSERTION;  Surgeon: Tonny Bollman, MD;  Location[facility] INVASIVE CV LAB;  Service: Cardiovascular;;   CORONARY ARTERY BYPASS GRAFT N/A 09/06/2023   Procedure: CORONARY ARTERY BYPASS GRAFTING (CABG) TIMES FOUR USING LEFT INTERNAL MAMMARY ARTERY AND RIGHT GREATER SAPHENOUS VEIN, HARVESTED ENDOSCOPICALLY;  Surgeon: [name], MD;  Location[facility] OR;  Service: Open Heart Surgery;  Laterality: N/A;   IR THORACENTESIS ASP PLEURAL SPACE W/IMG GUIDE  10/09/2023    MITRAL VALVE REPAIR N/A 09/06/2023   Procedure: MITRAL VALVE REPAIR USING CARPENTIER-MCCARTHY-ADAMS ANNUOPLASTY RING;  Surgeon: [name], MD;  Location: [facility] OR;  Service: Open Heart Surgery;  Laterality: N/A;   PERCUTANEOUS CORONARY STENT INTERVENTION (PCI-S)  05/14/10   LAD   PERCUTANEOUS CORONARY STENT INTERVENTION (PCI-S)  05/17/10   RCA   RIGHT HEART CATH N/A 09/04/2023   Procedure: RIGHT HEART CATH;  Surgeon: Romie Minus, MD;  Location: [facility] INVASIVE CV LAB;  Service: Cardiovascular;  Laterality: N/A;   RIGHT/LEFT HEART CATH AND CORONARY ANGIOGRAPHY N/A 08/28/2023   Procedure: RIGHT/LEFT HEART CATH AND CORONARY ANGIOGRAPHY;  Surgeon: Tonny Bollman, MD;  Location: [facility] INVASIVE CV LAB;  Service: Cardiovascular;  Laterality: N/A;   TEE WITHOUT CARDIOVERSION N/A 09/06/2023   Procedure: TRANSESOPHAGEAL ECHOCARDIOGRAM;  Surgeon: [name], MD;  Location: [facility] OR;  Service: Open Heart Surgery;  Laterality: N/A;   TRANSESOPHAGEAL ECHOCARDIOGRAM (CATH LAB) N/A 08/31/2023   Procedure: TRANSESOPHAGEAL ECHOCARDIOGRAM;  Surgeon: Dolores Patty, MD;  Location: [facility] INVASIVE CV LAB;  Service: Cardiovascular;  Laterality: N/A;    Current Outpatient Medications  Medication Sig Dispense Refill   acetaminophen (TYLENOL) 325 MG tablet Take 2 tablets (650 mg total) by mouth every 6 (six) hours as needed for mild pain (pain score 1-3).     amiodarone (PACERONE) 100 MG tablet Take 1 tablet (100 mg total) by mouth daily. 30 tablet 3   apixaban (  ELIQUIS) 5 MG TABS tablet Take 1 tablet (5 mg total) by mouth 2 (two) times daily. 60 tablet 5   aspirin EC 81 MG tablet Take 1 tablet (81 mg total) by mouth daily. Swallow whole.     BD INSULIN SYRINGE U/F 1/2UNIT 31G X 5/16" 0.3 ML MISC      Beta Carotene (VITAMIN A) 25000 UNIT capsule Take 25,000 Units by mouth daily.     carvedilol (COREG) 3.125 MG tablet Take 1 tablet (3.125 mg total) by mouth 2 (two) times daily. 60 tablet 3    cyanocobalamin (VITAMIN B12) 1000 MCG tablet Take 2,000 mcg by mouth daily.     dapagliflozin propanediol (FARXIGA) 10 MG TABS tablet Take 1 tablet (10 mg total) by mouth daily. 30 tablet 5   ezetimibe (ZETIA) 10 MG tablet Take 1 tablet (10 mg total) by mouth daily. 90 tablet 2   furosemide (LASIX) 40 MG tablet Take 1 tablet (40 mg total) by mouth daily. 30 tablet 3   gabapentin (NEURONTIN) 100 MG capsule Take 100 mg by mouth 2 (two) times daily. Take 100 mg at  morning and bedtime     insulin NPH-regular Human (NOVOLIN 70/30) (70-30) 100 UNIT/ML injection Inject 30-60 Units into the skin 2 (two) times daily.     loratadine (CLARITIN) 10 MG tablet Take 10 mg by mouth daily.     Oyster Shell (OYSTER CALCIUM) 500 MG TABS tablet Take 500 mg of elemental calcium by mouth 2 (two) times a week.     pantoprazole (PROTONIX) 40 MG tablet Take 1 tablet by mouth daily. Reported on 03/24/2016     sacubitril-valsartan (ENTRESTO) 24-26 MG Take 1 tablet by mouth 2 (two) times daily. 60 tablet 6   spironolactone (ALDACTONE) 25 MG tablet Take 1 tablet (25 mg total) by mouth daily. 30 tablet 5   sulfamethoxazole-trimethoprim (BACTRIM DS) 800-160 MG tablet Take 1 tablet by mouth 2 (two) times daily. For 7 days     traMADol (ULTRAM) 50 MG tablet Take 1 tablet (50 mg total) by mouth every 12 (twelve) hours as needed for severe pain (pain score 7-10). 20 tablet 0   No current facility-administered medications for this visit.    Physical Exam BP 115/74   Pulse 98   Resp 18   Ht 5\' 4"  (1.626 m)   Wt 182 lb (82.6 kg)   SpO2 94%   BMI 31.39 kg/m  78 year old woman in no acute distress Alert and oriented x 3 with no focal deficits Cardiac regular rate and rhythm, no murmur Sternal incision with 3 by 2 cm pink raised area of fluctuance inferiorly.  Nontender.  Diagnostic Tests: I personally viewed her chest x-ray images.  No change from prior.  Impression: Diana [name] is a 78 year old woman with a history of  CAD, severe mitral regurgitation, pulmonary hypertension, heart failure, hypertension, hyperlipidemia, type 2 diabetes, and obesity.  She underwent coronary bypass grafting and mitral valve repair in November 2024.  She developed a seroma at the inferior aspect of her sternal incision about a month ago.  That was incised and drained in then treated with normal saline wet-to-dry dressing changes.  Healed up rather quickly, but now presents with recurrence.  I recommended to her that we take her to the operating room and incise and drain the area under general anesthesia.  Possible sternal wire removal and possible VAC placement depending on findings.  There is no evidence of infection currently.  I informed her of the indications, risks,  benefits, and alternatives.  She understands the risks include bleeding, infection, as well as other potential serious complications associated with anesthesia and surgery.  Plan: Incision and drainage of sternal wound, possible VAC placement on Friday, 12/29/2023. She will hold Eliquis for 2 days prior to the procedure.  [name], MD [facility] [phone]

## 2023-12-26 NOTE — Progress Notes (Signed)
 [address]       [name] [address]             [phone]     HPI: [name] returns with recurrent swelling and drainage of her sternal wound.  Diana [name] is a 78 year old woman with a history of CAD, severe mitral regurgitation, pulmonary hypertension, heart failure, hypertension, hyperlipidemia, type 2 diabetes, and obesity.  Presented with chest pain and shortness of breath last fall.  Underwent coronary bypass grafting x 4 and mitral valve repair on 09/06/2023.  Postoperatively she had atrial fibrillation and went home on amiodarone and Eliquis.  I saw her in February with swelling and drainage from the inferior aspect of her sternal incision.  Chest x-ray showed that the lower to sternal wires had pulled through.  I did an I&D in the office and she treated with saline wet-to-dry dressing changes.  It healed up over the course of about 10 days.  Last week she again noted some drainage and then again developed an area of fluctuance on the lower portion of the sternal wound.  No fevers or chills.  Daughter saw the drainage, it was clear serous appearing.  Not purulent.  Past Medical History:  Diagnosis Date   CAD (coronary artery disease) 7/12-8/12   staged LAD/RCA DES   Diabetes mellitus (HCC)    Diastolic dysfunction 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    Dyslipidemia    HTN (hypertension)    Past Surgical History:  Procedure Laterality Date   CENTRAL LINE INSERTION  08/28/2023   Procedure: CENTRAL LINE INSERTION;  Surgeon: Tonny Bollman, MD;  Location: [facility] INVASIVE CV LAB;  Service: Cardiovascular;;   CORONARY ARTERY BYPASS GRAFT N/A 09/06/2023   Procedure: CORONARY ARTERY BYPASS GRAFTING (CABG) TIMES FOUR USING LEFT INTERNAL MAMMARY ARTERY AND RIGHT GREATER SAPHENOUS VEIN, HARVESTED ENDOSCOPICALLY;  Surgeon: [name], MD;  Location: [facility] OR;  Service: Open Heart Surgery;  Laterality: N/A;   IR THORACENTESIS ASP PLEURAL SPACE W/IMG GUIDE  10/09/2023    MITRAL VALVE REPAIR N/A 09/06/2023   Procedure: MITRAL VALVE REPAIR USING CARPENTIER-MCCARTHY-ADAMS ANNUOPLASTY RING;  Surgeon: [name], MD;  Location: [facility] OR;  Service: Open Heart Surgery;  Laterality: N/A;   PERCUTANEOUS CORONARY STENT INTERVENTION (PCI-S)  05/14/10   LAD   PERCUTANEOUS CORONARY STENT INTERVENTION (PCI-S)  05/17/10   RCA   RIGHT HEART CATH N/A 09/04/2023   Procedure: RIGHT HEART CATH;  Surgeon: Romie Minus, MD;  Location: [facility] INVASIVE CV LAB;  Service: Cardiovascular;  Laterality: N/A;   RIGHT/LEFT HEART CATH AND CORONARY ANGIOGRAPHY N/A 08/28/2023   Procedure: RIGHT/LEFT HEART CATH AND CORONARY ANGIOGRAPHY;  Surgeon: Tonny Bollman, MD;  Location: [facility] INVASIVE CV LAB;  Service: Cardiovascular;  Laterality: N/A;   TEE WITHOUT CARDIOVERSION N/A 09/06/2023   Procedure: TRANSESOPHAGEAL ECHOCARDIOGRAM;  Surgeon: [name], MD;  Location: [facility] OR;  Service: Open Heart Surgery;  Laterality: N/A;   TRANSESOPHAGEAL ECHOCARDIOGRAM (CATH LAB) N/A 08/31/2023   Procedure: TRANSESOPHAGEAL ECHOCARDIOGRAM;  Surgeon: Dolores Patty, MD;  Location: [facility] INVASIVE CV LAB;  Service: Cardiovascular;  Laterality: N/A;    Current Outpatient Medications  Medication Sig Dispense Refill   acetaminophen (TYLENOL) 325 MG tablet Take 2 tablets (650 mg total) by mouth every 6 (six) hours as needed for mild pain (pain score 1-3).     amiodarone (PACERONE) 100 MG tablet Take 1 tablet (100 mg total) by mouth daily. 30 tablet 3   apixaban (  ELIQUIS) 5 MG TABS tablet Take 1 tablet (5 mg total) by mouth 2 (two) times daily. 60 tablet 5   aspirin EC 81 MG tablet Take 1 tablet (81 mg total) by mouth daily. Swallow whole.     BD INSULIN SYRINGE U/F 1/2UNIT 31G X 5/16" 0.3 ML MISC      Beta Carotene (VITAMIN A) 25000 UNIT capsule Take 25,000 Units by mouth daily.     carvedilol (COREG) 3.125 MG tablet Take 1 tablet (3.125 mg total) by mouth 2 (two) times daily. 60 tablet 3    cyanocobalamin (VITAMIN B12) 1000 MCG tablet Take 2,000 mcg by mouth daily.     dapagliflozin propanediol (FARXIGA) 10 MG TABS tablet Take 1 tablet (10 mg total) by mouth daily. 30 tablet 5   ezetimibe (ZETIA) 10 MG tablet Take 1 tablet (10 mg total) by mouth daily. 90 tablet 2   furosemide (LASIX) 40 MG tablet Take 1 tablet (40 mg total) by mouth daily. 30 tablet 3   gabapentin (NEURONTIN) 100 MG capsule Take 100 mg by mouth 2 (two) times daily. Take 100 mg at  morning and bedtime     insulin NPH-regular Human (NOVOLIN 70/30) (70-30) 100 UNIT/ML injection Inject 30-60 Units into the skin 2 (two) times daily.     loratadine (CLARITIN) 10 MG tablet Take 10 mg by mouth daily.     Oyster Shell (OYSTER CALCIUM) 500 MG TABS tablet Take 500 mg of elemental calcium by mouth 2 (two) times a week.     pantoprazole (PROTONIX) 40 MG tablet Take 1 tablet by mouth daily. Reported on 03/24/2016     sacubitril-valsartan (ENTRESTO) 24-26 MG Take 1 tablet by mouth 2 (two) times daily. 60 tablet 6   spironolactone (ALDACTONE) 25 MG tablet Take 1 tablet (25 mg total) by mouth daily. 30 tablet 5   sulfamethoxazole-trimethoprim (BACTRIM DS) 800-160 MG tablet Take 1 tablet by mouth 2 (two) times daily. For 7 days     traMADol (ULTRAM) 50 MG tablet Take 1 tablet (50 mg total) by mouth every 12 (twelve) hours as needed for severe pain (pain score 7-10). 20 tablet 0   No current facility-administered medications for this visit.    Physical Exam BP 115/74   Pulse 98   Resp 18   Ht 5\' 4"  (1.626 m)   Wt 182 lb (82.6 kg)   SpO2 94%   BMI 31.39 kg/m  78 year old woman in no acute distress Alert and oriented x 3 with no focal deficits Cardiac regular rate and rhythm, no murmur Sternal incision with 3 by 2 cm pink raised area of fluctuance inferiorly.  Nontender.  Diagnostic Tests: I personally viewed her chest x-ray images.  No change from prior.  Impression: Diana [name] is a 78 year old woman with a history of  CAD, severe mitral regurgitation, pulmonary hypertension, heart failure, hypertension, hyperlipidemia, type 2 diabetes, and obesity.  She underwent coronary bypass grafting and mitral valve repair in November 2024.  She developed a seroma at the inferior aspect of her sternal incision about a month ago.  That was incised and drained in then treated with normal saline wet-to-dry dressing changes.  Healed up rather quickly, but now presents with recurrence.  I recommended to her that we take her to the operating room and incise and drain the area under general anesthesia.  Possible sternal wire removal and possible VAC placement depending on findings.  There is no evidence of infection currently.  I informed her of the indications, risks,  benefits, and alternatives.  She understands the risks include bleeding, infection, as well as other potential serious complications associated with anesthesia and surgery.  Plan: Incision and drainage of sternal wound, possible VAC placement on Friday, 12/29/2023. She will hold Eliquis for 2 days prior to the procedure.  [name], MD [facility] [phone]

## 2023-12-27 ENCOUNTER — Encounter (HOSPITAL_COMMUNITY)
Admission: RE | Admit: 2023-12-27 | Discharge: 2023-12-27 | Disposition: A | Discharge status: Home / Self Care | Payer: Medicare HMO | Source: Ambulatory Visit | Attending: Internal Medicine

## 2023-12-27 ENCOUNTER — Other Ambulatory Visit: Payer: Self-pay | Admitting: Thoracic Surgery (Cardiothoracic Vascular Surgery)

## 2023-12-27 DIAGNOSIS — Z951 Presence of aortocoronary bypass graft: Secondary | ICD-10-CM

## 2023-12-27 DIAGNOSIS — Z9889 Other specified postprocedural states: Secondary | ICD-10-CM

## 2023-12-27 DIAGNOSIS — E162 Hypoglycemia, unspecified: Secondary | ICD-10-CM

## 2023-12-27 NOTE — Pre-Procedure Instructions (Signed)
 Surgical Instructions   Your procedure is scheduled on December 29, 2023. Report to [facility] Main Entrance "A" at 10:10 A.M., then check in with the Admitting office. Any questions or running late day of surgery: call [phone]  Questions prior to your surgery date: call [phone], Monday-Friday, 8am-4pm. If you experience any cold or flu symptoms such as cough, fever, chills, shortness of breath, etc. between now and your scheduled surgery, please notify us at the above number.     Remember:  Do not eat or drink after midnight the night before your surgery    Take these medicines the morning of surgery with A SIP OF WATER: amiodarone (PACERONE)  carvedilol (COREG)  ezetimibe (ZETIA)  gabapentin (NEURONTIN)  loratadine (CLARITIN)  pantoprazole (PROTONIX)    May take these medicines IF NEEDED: acetaminophen (TYLENOL)  albuterol (VENTOLIN HFA) inhaler - please bring inhaler with you morning of surgery ondansetron (ZOFRAN-ODT)  traMADol (ULTRAM)    STOP taking your apixaban (ELIQUIS) two days prior to surgery. Your last dose will be March 11th.  Continue taking your Aspirin through the day before surgery. DO NOT take any the morning of surgery   One week prior to surgery, STOP taking any Aleve, Naproxen, Ibuprofen, Motrin, Advil, Goody's, BC's, all herbal medications, fish oil, and non-prescription vitamins.   WHAT DO I DO ABOUT MY DIABETES MEDICATION?   THE NIGHT BEFORE SURGERY, take 50% of your normal dose of insulin NPH-regular Human (NOVOLIN 70/30).      THE MORNING OF SURGERY, take 50% of your normal dose of insulin NPH-regular Human (NOVOLIN 70/30).  STOP taking your dapagliflozin propanediol (FARXIGA) three days prior to surgery. Your last dose will be March 10th.   HOW TO MANAGE YOUR DIABETES BEFORE AND AFTER SURGERY  Why is it important to control my blood sugar before and after surgery? Improving blood sugar levels before and after surgery helps  healing and can limit problems. A way of improving blood sugar control is eating a healthy diet by:  Eating less sugar and carbohydrates  Increasing activity/exercise  Talking with your doctor about reaching your blood sugar goals High blood sugars (greater than 180 mg/dL) can raise your risk of infections and slow your recovery, so you will need to focus on controlling your diabetes during the weeks before surgery. Make sure that the doctor who takes care of your diabetes knows about your planned surgery including the date and location.  How do I manage my blood sugar before surgery? Check your blood sugar at least 4 times a day, starting 2 days before surgery, to make sure that the level is not too high or low.  Check your blood sugar the morning of your surgery when you wake up and every 2 hours until you get to the Short Stay unit.  If your blood sugar is less than 70 mg/dL, you will need to treat for low blood sugar: Do not take insulin. Treat a low blood sugar (less than 70 mg/dL) with  cup of clear juice (cranberry or apple), 4 glucose tablets, OR glucose gel. Recheck blood sugar in 15 minutes after treatment (to make sure it is greater than 70 mg/dL). If your blood sugar is not greater than 70 mg/dL on recheck, call [phone] for further instructions. Report your blood sugar to the short stay nurse when you get to Short Stay.  If you are admitted to the hospital after surgery: Your blood sugar will be checked by the staff and you will probably be  given insulin after surgery (instead of oral diabetes medicines) to make sure you have good blood sugar levels. The goal for blood sugar control after surgery is 80-180 mg/dL.             Do NOT Smoke (Tobacco/Vaping) for 24 hours prior to your procedure.  If you use a CPAP at night, you may bring your mask/headgear for your overnight stay.   You will be asked to remove any contacts, glasses, piercing's, hearing aid's,  dentures/partials prior to surgery. Please bring cases for these items if needed.    Patients discharged the day of surgery will not be allowed to drive home, and someone needs to stay with them for 24 hours.  SURGICAL WAITING ROOM VISITATION Patients may have no more than 2 support people in the waiting area - these visitors may rotate.   Pre-op nurse will coordinate an appropriate time for 1 ADULT support person, who may not rotate, to accompany patient in pre-op.  Children under the age of 35 must have an adult with them who is not the patient and must remain in the main waiting area with an adult.  If the patient needs to stay at the hospital during part of their recovery, the visitor guidelines for inpatient rooms apply.  Please refer to the [facility] website for the visitor guidelines for any additional information.   If you received a COVID test during your pre-op visit  it is requested that you wear a mask when out in public, stay away from anyone that may not be feeling well and notify your surgeon if you develop symptoms. If you have been in contact with anyone that has tested positive in the last 10 days please notify you surgeon.      Pre-operative CHG Bathing Instructions   You can play a key role in reducing the risk of infection after surgery. Your skin needs to be as free of germs as possible. You can reduce the number of germs on your skin by washing with CHG (chlorhexidine gluconate) soap before surgery. CHG is an antiseptic soap that kills germs and continues to kill germs even after washing.   DO NOT use if you have an allergy to chlorhexidine/CHG or antibacterial soaps. If your skin becomes reddened or irritated, stop using the CHG and notify one of our RNs at [phone].              TAKE A SHOWER THE NIGHT BEFORE SURGERY AND THE DAY OF SURGERY    Please keep in mind the following:  DO NOT shave, including legs and underarms, 48 hours prior to surgery.   You may  shave your face before/day of surgery.  Place clean sheets on your bed the night before surgery Use a clean washcloth (not used since being washed) for each shower. DO NOT sleep with pet's night before surgery.  CHG Shower Instructions:  Wash your face and private area with normal soap. If you choose to wash your hair, wash first with your normal shampoo.  After you use shampoo/soap, rinse your hair and body thoroughly to remove shampoo/soap residue.  Turn the water OFF and apply half the bottle of CHG soap to a CLEAN washcloth.  Apply CHG soap ONLY FROM YOUR NECK DOWN TO YOUR TOES (washing for 3-5 minutes)  DO NOT use CHG soap on face, private areas, open wounds, or sores.  Pay special attention to the area where your surgery is being performed.  If you are having  back surgery, having someone wash your back for you may be helpful. Wait 2 minutes after CHG soap is applied, then you may rinse off the CHG soap.  Pat dry with a clean towel  Put on clean pajamas    Additional instructions for the day of surgery: DO NOT APPLY any lotions, deodorants, cologne, or perfumes.   Do not wear jewelry or makeup Do not wear nail polish, gel polish, artificial nails, or any other type of covering on natural nails (fingers and toes) Do not bring valuables to the hospital. [facility] is not responsible for valuables/personal belongings. Put on clean/comfortable clothes.  Please brush your teeth.  Ask your nurse before applying any prescription medications to the skin.

## 2023-12-27 NOTE — Telephone Encounter (Signed)
 Pts daughter returned call Would like to know if she can stop amio with increased tremors   -aware this was to be discussed at follow up however cannot wait until May

## 2023-12-28 ENCOUNTER — Other Ambulatory Visit: Payer: Self-pay

## 2023-12-28 ENCOUNTER — Ambulatory Visit (HOSPITAL_COMMUNITY)
Admission: RE | Admit: 2023-12-28 | Discharge: 2023-12-28 | Disposition: A | Discharge status: Home / Self Care | Source: Ambulatory Visit | Attending: Thoracic Surgery (Cardiothoracic Vascular Surgery) | Admitting: Thoracic Surgery (Cardiothoracic Vascular Surgery)

## 2023-12-28 ENCOUNTER — Encounter (HOSPITAL_COMMUNITY): Payer: Self-pay

## 2023-12-28 ENCOUNTER — Encounter (HOSPITAL_COMMUNITY)
Admission: RE | Admit: 2023-12-28 | Discharge: 2023-12-28 | Disposition: A | Discharge status: Home / Self Care | Source: Ambulatory Visit | Attending: Thoracic Surgery (Cardiothoracic Vascular Surgery) | Admitting: Thoracic Surgery (Cardiothoracic Vascular Surgery)

## 2023-12-28 VITALS — BP 126/66 | HR 100 | Resp 18 | Ht 64.0 in | Wt 180.3 lb

## 2023-12-28 DIAGNOSIS — J449 Chronic obstructive pulmonary disease, unspecified: Secondary | ICD-10-CM | POA: Insufficient documentation

## 2023-12-28 DIAGNOSIS — I110 Hypertensive heart disease with heart failure: Secondary | ICD-10-CM | POA: Diagnosis not present

## 2023-12-28 DIAGNOSIS — T8130XA Disruption of wound, unspecified, initial encounter: Secondary | ICD-10-CM | POA: Diagnosis not present

## 2023-12-28 DIAGNOSIS — I5022 Chronic systolic (congestive) heart failure: Secondary | ICD-10-CM | POA: Diagnosis not present

## 2023-12-28 DIAGNOSIS — E785 Hyperlipidemia, unspecified: Secondary | ICD-10-CM | POA: Insufficient documentation

## 2023-12-28 DIAGNOSIS — I97641 Postprocedural seroma of a circulatory system organ or structure following cardiac bypass: Secondary | ICD-10-CM | POA: Insufficient documentation

## 2023-12-28 DIAGNOSIS — Z951 Presence of aortocoronary bypass graft: Secondary | ICD-10-CM | POA: Diagnosis not present

## 2023-12-28 DIAGNOSIS — I2510 Atherosclerotic heart disease of native coronary artery without angina pectoris: Secondary | ICD-10-CM | POA: Diagnosis not present

## 2023-12-28 DIAGNOSIS — Z01812 Encounter for preprocedural laboratory examination: Secondary | ICD-10-CM | POA: Diagnosis present

## 2023-12-28 DIAGNOSIS — E119 Type 2 diabetes mellitus without complications: Secondary | ICD-10-CM | POA: Diagnosis not present

## 2023-12-28 DIAGNOSIS — I480 Paroxysmal atrial fibrillation: Secondary | ICD-10-CM | POA: Insufficient documentation

## 2023-12-28 DIAGNOSIS — Z01818 Encounter for other preprocedural examination: Secondary | ICD-10-CM

## 2023-12-28 DIAGNOSIS — Z794 Long term (current) use of insulin: Secondary | ICD-10-CM | POA: Insufficient documentation

## 2023-12-28 HISTORY — DX: Pneumonia, unspecified organism: J18.9

## 2023-12-28 HISTORY — DX: Chronic obstructive pulmonary disease, unspecified: J44.9

## 2023-12-28 HISTORY — DX: Unspecified osteoarthritis, unspecified site: M19.90

## 2023-12-28 HISTORY — DX: Cardiac arrhythmia, unspecified: I49.9

## 2023-12-28 HISTORY — DX: Heart failure, unspecified: I50.9

## 2023-12-28 HISTORY — DX: Cardiac murmur, unspecified: R01.1

## 2023-12-28 LAB — CBC
HCT: 36.6 % (ref 36.0–46.0)
Hemoglobin: 11.1 g/dL — ABNORMAL LOW (ref 12.0–15.0)
MCH: 24.3 pg — ABNORMAL LOW (ref 26.0–34.0)
MCHC: 30.3 g/dL (ref 30.0–36.0)
MCV: 80.1 fL (ref 80.0–100.0)
Platelets: 490 10*3/uL — ABNORMAL HIGH (ref 150–400)
RBC: 4.57 MIL/uL (ref 3.87–5.11)
RDW: 18.7 % — ABNORMAL HIGH (ref 11.5–15.5)
WBC: 8.4 10*3/uL (ref 4.0–10.5)
nRBC: 0 % (ref 0.0–0.2)

## 2023-12-28 LAB — COMPREHENSIVE METABOLIC PANEL
ALT: 22 U/L (ref 0–44)
AST: 18 U/L (ref 15–41)
Albumin: 3.2 g/dL — ABNORMAL LOW (ref 3.5–5.0)
Alkaline Phosphatase: 49 U/L (ref 38–126)
Anion gap: 13 (ref 5–15)
BUN: 27 mg/dL — ABNORMAL HIGH (ref 8–23)
CO2: 23 mmol/L (ref 22–32)
Calcium: 9.1 mg/dL (ref 8.9–10.3)
Chloride: 103 mmol/L (ref 98–111)
Creatinine, Ser: 0.99 mg/dL (ref 0.44–1.00)
GFR, Estimated: 59 mL/min — ABNORMAL LOW (ref >60)
Glucose, Bld: 149 mg/dL — ABNORMAL HIGH (ref 70–99)
Potassium: 3.4 mmol/L — ABNORMAL LOW (ref 3.5–5.1)
Sodium: 139 mmol/L (ref 135–145)
Total Bilirubin: 0.4 mg/dL (ref 0.0–1.2)
Total Protein: 7.8 g/dL (ref 6.5–8.1)

## 2023-12-28 LAB — HEMOGLOBIN A1C
Hgb A1c MFr Bld: 6.7 % — ABNORMAL HIGH (ref 4.8–5.6)
Mean Plasma Glucose: 145.59 mg/dL

## 2023-12-28 LAB — PROTIME-INR
INR: 1 (ref 0.8–1.2)
Prothrombin Time: 13.7 s (ref 11.4–15.2)

## 2023-12-28 LAB — SURGICAL PCR SCREEN
MRSA, PCR: NEGATIVE
Staphylococcus aureus: NEGATIVE

## 2023-12-28 LAB — APTT: aPTT: 29 s (ref 24–36)

## 2023-12-28 LAB — GLUCOSE, CAPILLARY: Glucose-Capillary: 143 mg/dL — ABNORMAL HIGH (ref 70–99)

## 2023-12-28 NOTE — Anesthesia Preprocedure Evaluation (Addendum)
 Anesthesia Evaluation  Patient identified by MRN, date of birth, ID band Patient awake    Reviewed: Allergy & Precautions, NPO status , Patient's Chart, lab work & pertinent test results  Airway Mallampati: II  TM Distance: >3 FB Neck ROM: Full    Dental  (+) Dental Advisory Given   Pulmonary shortness of breath, COPD   breath sounds clear to auscultation       Cardiovascular hypertension, Pt. on medications and Pt. on home beta blockers + CAD, + Cardiac Stents, + CABG and +CHF  + dysrhythmias + Valvular Problems/Murmurs MR  Rhythm:Regular  ECHO 11/24:  1. Left ventricular ejection fraction, by estimation, is 25 to 30%. The  left ventricle has severely decreased function. The left ventricle  demonstrates global hypokinesis. The left ventricular internal cavity size  was mildly dilated. Left ventricular  diastolic parameters are indeterminate. There is severe akinesis of the  left ventricular, entire inferior wall.   2. Right ventricular systolic function is low normal. The right  ventricular size is normal. There is moderately elevated pulmonary artery  systolic pressure. The estimated right ventricular systolic pressure is  59.3 mmHg.   3. Left atrial size was moderately dilated.   4. Suspect ischemic MR with posterior leaflet tethering. The mitral valve  is abnormal. Severe mitral valve regurgitation.   5. The aortic valve is tricuspid. Aortic valve regurgitation is trivial.  Aortic valve sclerosis/calcification is present, without any evidence of  aortic stenosis.   6. The inferior vena cava is dilated in size with >50% respiratory  variability, suggesting right atrial pressure of 8 mmHg.    Cath 08/28/23: HEMODYNAMICS: RA:       2 mmHg (mean) RV:       44/1, 2 mmHg PA:       45/17 mmHg (29 mean) PCWP: 11 mmHg (mean) with v waves to 18    Estimated Fick CO/CI   4.46L/min, 2.32L/min/m2 Thermodilution CO/CI   4.62L/min,  2.4L/min/m2  TPG  18  mmHg     PVR  4 Wood Units PAPi  14   IMPRESSION: Significantly improved filling pressures from previous. Normalized cardiac output by thermodilution. 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   Neuro/Psych negative neurological ROS     GI/Hepatic Neg liver ROS,GERD  Medicated,,  Endo/Other  diabetes, Type 2, Oral Hypoglycemic Agents    Renal/GU negative Renal ROS     Musculoskeletal negative musculoskeletal ROS (+)    Abdominal Normal abdominal exam  (+)   Peds  Hematology Lab Results      Component                Value               Date                      WBC                      8.2                 09/05/2023                HGB                      11.2 (L)            09/05/2023  HCT                      35.2 (L)            09/05/2023                MCV                      80.7                09/05/2023                PLT                      278                 09/05/2023             Lab Results      Component                Value               Date                      NA                       131 (L)             09/05/2023                K                        4.4                 09/05/2023                CO2                      23                  09/05/2023                GLUCOSE                  263 (H)             09/05/2023                BUN                      18                  09/05/2023                CREATININE               1.02 (H)            09/05/2023                CALCIUM                  8.9                 09/05/2023                EGFR  56 (L)              08/24/2023                GFRNONAA                 57 (L)              09/05/2023              Anesthesia Other Findings   Reproductive/Obstetrics                              Anesthesia Physical Anesthesia Plan  ASA: 3  Anesthesia Plan: General   Post-op  Pain Management:    Induction: Intravenous  PONV Risk Score and Plan: 3 and Ondansetron, Treatment may vary due to age or medical condition and Dexamethasone  Airway Management Planned: LMA and Oral ETT  Additional Equipment: None  Intra-op Plan:   Post-operative Plan: Extubation in OR  Informed Consent: I have reviewed the patients History and Physical, chart, labs and discussed the procedure including the risks, benefits and alternatives for the proposed anesthesia with the patient or authorized representative who has indicated his/her understanding and acceptance.     Dental advisory given  Plan Discussed with: CRNA  Anesthesia Plan Comments: (PAT note written 12/28/2023 by Shonna Chock, PA-C.  )        Anesthesia Quick Evaluation

## 2023-12-28 NOTE — Progress Notes (Signed)
 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 She is s/p CABG/MV Repair on 09/06/23. At February follow-up she was noted to have swelling and drainage from the inferior aspect of her sternal incision. CXR showed the lower sternal wire had pulled through. I&D in the office was done with wet-to-dry dressing. The incision healed but at 12/26/23 she reported recurrent drainage. Dr. Dorris Fetch recommended I&D in the OR, possible sternal wire removal and possible VAC.   History includes never smoker, COPD, HTN, dyslipidemia, CAD (DES LAD 05/14/10 & DES RCA 05/17/10; CABG: LIMA-LAD, SVG-D1, SVG-OM, SVG-acute marginal combined with MV repair using 30 mm annuloplasty ring 09/06/23), murmur/severe MR (s/p MR Repair 09/06/23, post-op echo trivial MR with moderate MS mean gradient 5 mmHg), HFrEF, PAF (post-CABG), DM2.   She reported "stomach flu" about 2 weeks ago followed by URI symptoms and completed a course of Augmentin ~ 12/25/23. She denied ongoing sick symptoms at 12/28/23 PAT RN visit. 12/28/23 CXR is in process, but 12/26/23 CXR showed no interval change, postop chest with left-sided pleural effusion or thickening stable with some linear changes. She was evaluated by Dr. Dorris Fetch on 12/26/23 and needs procedure for sternal wound. A1c 6.7%.   On 12/27/23 cardiology said she could stop amiodarone per her request due to tremors. She is suppose to let them know if she develops recurrent  palpitations.  She reported last [place] 3 days ago and last Eliquis 12/26/23.   Anesthesia team to evaluate on the day of surgery.    VS: BP 126/66   Pulse 100   Resp 18   Ht 5\' 4"  (1.626 m)   Wt 81.8 kg   SpO2 97%   BMI 30.95 kg/m    PROVIDERS: [name], NP is PCP  Arvilla Meres, MD is HF cardiologist Nicki Guadalajara, MD is primary cardiologist [name], MD is endocrinologist ([facility])   LABS: Labs reviewed: Acceptable for surgery. (all labs ordered are listed, but only abnormal results are displayed)  Labs Reviewed  GLUCOSE, CAPILLARY - Abnormal; Notable for the following components:      Result Value   Glucose-Capillary 143 (*)    All other components within normal limits  COMPREHENSIVE METABOLIC PANEL - Abnormal; Notable for the following components:   Potassium 3.4 (*)    Glucose, Bld 149 (*)    BUN 27 (*)    Albumin 3.2 (*)    GFR, Estimated 59 (*)    All other components within normal limits  CBC - Abnormal; Notable for the following components:   Hemoglobin 11.1 (*)    MCH 24.3 (*)    RDW 18.7 (*)    Platelets 490 (*)    All other components within normal limits  HEMOGLOBIN A1C - Abnormal; Notable for the following components:   Hgb A1c MFr Bld 6.7 (*)    All other components within normal limits  SURGICAL PCR SCREEN  PROTIME-INR  APTT    IMAGES: CXR 12/28/23: In process.  CXR 12/26/23: FINDINGS: Hyperinflation. Stable sternal wires and prosthetic valve. Tiny left pleural effusion or pleural thickening is stable. Adjacent bandlike changes, scar or atelectasis. Right lung is grossly clear. No edema or pneumothorax. Degenerative changes along the spine. IMPRESSION: No interval change. Postop chest. Left-sided pleural effusion or thickening stable with some linear changes.   EKG: 11/21/23: Normal sinus rhythm Nonspecific ST and T wave abnormality Abnormal ECG When compared with ECG of 02-Oct-2023 08:46, No significant change was  found Confirmed by Donato Schultz ([id number]) on 11/21/2023 2:25:30 PM   CV: Echo 10/20/23: IMPRESSIONS   1. Left ventricular ejection fraction, by estimation, is 25 to 30%. The  left ventricle has severely decreased function. The left ventricle  demonstrates global hypokinesis. Left ventricular diastolic function could  not be evaluated. Elevated left atrial  pressure.   2. Right ventricular systolic function is normal. The right ventricular  size is normal.   3. Left atrial size was mildly dilated.   4. The mitral valve has been repaired/replaced. Mild mitral valve  regurgitation. No evidence of mitral stenosis. There is a 30 mm prosthetic  annuloplasty ring present in the mitral position.   5. The aortic valve is tricuspid. There is mild calcification of the  aortic valve. There is mild thickening of the aortic valve. Aortic valve  regurgitation is mild. Mild aortic valve stenosis. Aortic valve area, by  VTI measures 1.56 cm. Aortic valve  mean gradient measures 11.0 mmHg. Aortic valve Vmax measures 2.36 m/s.   6. The inferior vena cava is normal in size with greater than 50%  respiratory variability, suggesting right atrial pressure of 3 mmHg.  - Previous EF 30-35% 09/18/23; 25-30% 08/24/23   US Carotid 09/04/23: Summary:  - Right Carotid: Velocities in the right ICA are consistent with a 1-39%  stenosis. The ECA appears <50% stenosed. There was no evidence of  thrombus, dissection, atherosclerotic plaque or stenosis in the  cervical carotid system.  - Left Carotid: Velocities in the left ICA are consistent with a 1-39%  stenosis. The ECA appears <50% stenosed.  - Vertebrals:  Bilateral vertebral arteries demonstrate antegrade flow.  - Subclavians: Normal flow hemodynamics were seen in bilateral subclavian arteries.    Past Medical History:  Diagnosis Date   Arthritis    CAD (coronary artery disease) 7/12-8/12   staged LAD/RCA DES   CHF (congestive heart failure) (HCC)    COPD  (chronic obstructive pulmonary disease) (HCC)    Diabetes mellitus (HCC)    Diastolic dysfunction 12/15/2012   grade 1    Dyslipidemia    Dysrhythmia    A. fib post CABG   Heart murmur    Mitral Valve repair November 2024   HTN (hypertension)    Pneumonia     Past Surgical History:  Procedure Laterality Date   BREAST SURGERY Right 1978   Drainage from breast. Done in [place]   CENTRAL LINE INSERTION  08/28/2023   Procedure: CENTRAL LINE INSERTION;  Surgeon: Tonny Bollman, MD;  Location: [facility] INVASIVE CV LAB;  Service: Cardiovascular;;   CESAREAN SECTION     1976, 1979   COLONOSCOPY  08/2022   CORONARY ARTERY BYPASS GRAFT N/A 09/06/2023   Procedure: CORONARY ARTERY BYPASS GRAFTING (CABG) TIMES FOUR USING LEFT INTERNAL MAMMARY ARTERY AND RIGHT GREATER SAPHENOUS VEIN, HARVESTED ENDOSCOPICALLY;  Surgeon: [name], MD;  Location: [facility] OR;  Service: Open Heart Surgery;  Laterality: N/A;   IR  THORACENTESIS ASP PLEURAL SPACE W/IMG GUIDE  10/09/2023   MITRAL VALVE REPAIR N/A 09/06/2023   Procedure: MITRAL VALVE REPAIR USING CARPENTIER-MCCARTHY-ADAMS ANNUOPLASTY RING;  Surgeon: [name], MD;  Location: [facility] OR;  Service: Open Heart Surgery;  Laterality: N/A;   PERCUTANEOUS CORONARY STENT INTERVENTION (PCI-S)  05/14/2010   LAD   PERCUTANEOUS CORONARY STENT INTERVENTION (PCI-S)  05/17/2010   RCA   RIGHT HEART CATH N/A 09/04/2023   Procedure: RIGHT HEART CATH;  Surgeon: Romie Minus, MD;  Location: [facility] INVASIVE CV LAB;  Service: Cardiovascular;  Laterality: N/A;   RIGHT/LEFT HEART CATH AND CORONARY ANGIOGRAPHY N/A 08/28/2023   Procedure: RIGHT/LEFT HEART CATH AND CORONARY ANGIOGRAPHY;  Surgeon: Tonny Bollman, MD;  Location: [facility] INVASIVE CV LAB;  Service: Cardiovascular;  Laterality: N/A;   TEE WITHOUT CARDIOVERSION N/A 09/06/2023   Procedure: TRANSESOPHAGEAL ECHOCARDIOGRAM;  Surgeon: [name], MD;  Location: [facility] OR;  Service: Open Heart Surgery;   Laterality: N/A;   TRANSESOPHAGEAL ECHOCARDIOGRAM (CATH LAB) N/A 08/31/2023   Procedure: TRANSESOPHAGEAL ECHOCARDIOGRAM;  Surgeon: Dolores Patty, MD;  Location: [facility] INVAS[facility] CV LAB;  Service: Cardiovascular;  Laterality: N/A;    MEDICATIONS:  acetaminophen (TYLENOL) 325 MG tablet   albuterol (VENTOLIN HFA) 108 (90 Base) MCG/ACT inhaler   amiodarone (PACERONE) 100 MG tablet   apixaban (ELIQUIS) 5 MG TABS tablet   aspirin EC 81 MG tablet   BD INSULIN SYRINGE U/F 1/2UNIT 31G X 5/16" 0.3 ML MISC   Beta Carotene (VITAMIN A) 25000 UNIT capsule   carvedilol (COREG) 3.125 MG tablet   cyanocobalamin (VITAMIN B12) 1000 MCG tablet   dapagliflozin propanediol (FARXIGA) 10 MG TABS tablet   ezetimibe (ZETIA) 10 MG tablet   furosemide (LASIX) 40 MG tablet   gabapentin (NEURONTIN) 100 MG capsule   insulin NPH-regular Human (NOVOLIN 70/30) (70-30) 100 UNIT/ML injection   loratadine (CLARITIN) 10 MG tablet   ondansetron (ZOFRAN-ODT) 8 MG disintegrating tablet   Oyster Shell (OYSTER CALCIUM) 500 MG TABS tablet   pantoprazole (PROTONIX) 40 MG tablet   sacubitril-valsartan (ENTRESTO) 24-26 MG   spironolactone (ALDACTONE) 25 MG tablet   traMADol (ULTRAM) 50 MG tablet   No current facility-administered medications for this encounter.    Shonna Chock, PA-C Surgical Short Stay/Anesthesiology [facility] Phone [phone] [facility] Phone [phone] 12/28/2023 12:51 PM

## 2023-12-28 NOTE — Progress Notes (Signed)
 PCP - Dr. [name] Cardiologist - Dr. Nicki Guadalajara  Heart Failure MD - Dr. Arvilla Meres - Last office visit 11/21/2023 with NP  PPM/ICD - Denies Device Orders - n/a Rep Notified - n/a  Chest x-ray - 12/28/2023 EKG - 11/21/2023 Stress Test - 08/15/2023 ECHO - 10/20/2023 Cardiac Cath - 09/04/2023  Sleep Study - Denies CPAP - n/a  Pt is DM2. She checks her blood sugars 3-5x/day. Normal fasting range is 50-90s. CBG at pre-op appointment 143. A1c result pending.  Last dose of GLP1 agonist- n/a GLP1 instructions: n/a GLP2 stopped about three days ago (Farxiga)  Blood Thinner Instructions: Pt instructed to hold Eliquis for 2 days. Last dose was March 11th. Aspirin Instructions: Pt will continue ASA through the day before surgery and take NONE the morning of surgery  NPO after midnight   COVID TEST- n/a   Anesthesia review: Yes. Hx of CAD, Murmur with CABG and Mitral Valve Repair November 2024, Heart failure, DM, COPD. Pt also notes recent stomach flu with aspiration which led to a URI. She was started on Augmentin March 3rd and completed the 7 day course. Pt denies any current stomach or respiratory problems, just seasonal allergies.  Patient denies shortness of breath, fever, cough and chest pain at PAT appointment   All instructions explained to the patient, with a verbal understanding of the material. Patient agrees to go over the instructions while at home for a better understanding. Patient also instructed to self quarantine after being tested for COVID-19. The opportunity to ask questions was provided.

## 2023-12-28 NOTE — Telephone Encounter (Signed)
Pt aware.

## 2023-12-29 ENCOUNTER — Encounter (HOSPITAL_COMMUNITY): Payer: Medicare HMO

## 2023-12-29 ENCOUNTER — Ambulatory Visit (HOSPITAL_BASED_OUTPATIENT_CLINIC_OR_DEPARTMENT_OTHER): Admitting: Certified Registered Nurse Anesthetist

## 2023-12-29 ENCOUNTER — Encounter (HOSPITAL_COMMUNITY)
Admission: RE | Disposition: A | Discharge status: Home / Self Care | Payer: Self-pay | Source: Ambulatory Visit | Attending: Thoracic Surgery (Cardiothoracic Vascular Surgery)

## 2023-12-29 ENCOUNTER — Encounter (HOSPITAL_COMMUNITY): Payer: Self-pay | Admitting: Thoracic Surgery (Cardiothoracic Vascular Surgery)

## 2023-12-29 ENCOUNTER — Ambulatory Visit (HOSPITAL_COMMUNITY): Admitting: Vascular Surgery

## 2023-12-29 ENCOUNTER — Observation Stay (HOSPITAL_COMMUNITY)
Admission: RE | Admit: 2023-12-29 | Discharge: 2023-12-30 | Disposition: A | Discharge status: Home / Self Care | Source: Ambulatory Visit | Attending: Thoracic Surgery (Cardiothoracic Vascular Surgery) | Admitting: Thoracic Surgery (Cardiothoracic Vascular Surgery)

## 2023-12-29 ENCOUNTER — Other Ambulatory Visit: Payer: Self-pay

## 2023-12-29 DIAGNOSIS — I97641 Postprocedural seroma of a circulatory system organ or structure following cardiac bypass: Secondary | ICD-10-CM

## 2023-12-29 DIAGNOSIS — I5023 Acute on chronic systolic (congestive) heart failure: Secondary | ICD-10-CM | POA: Insufficient documentation

## 2023-12-29 DIAGNOSIS — J449 Chronic obstructive pulmonary disease, unspecified: Secondary | ICD-10-CM | POA: Insufficient documentation

## 2023-12-29 DIAGNOSIS — T81329A Deep disruption or dehiscence of operation wound, unspecified, initial encounter: Principal | ICD-10-CM | POA: Insufficient documentation

## 2023-12-29 DIAGNOSIS — Z79899 Other long term (current) drug therapy: Secondary | ICD-10-CM | POA: Diagnosis not present

## 2023-12-29 DIAGNOSIS — Z7901 Long term (current) use of anticoagulants: Secondary | ICD-10-CM | POA: Insufficient documentation

## 2023-12-29 DIAGNOSIS — Z794 Long term (current) use of insulin: Secondary | ICD-10-CM | POA: Diagnosis not present

## 2023-12-29 DIAGNOSIS — Z951 Presence of aortocoronary bypass graft: Secondary | ICD-10-CM | POA: Diagnosis not present

## 2023-12-29 DIAGNOSIS — Y848 Other medical procedures as the cause of abnormal reaction of the patient, or of later complication, without mention of misadventure at the time of the procedure: Secondary | ICD-10-CM | POA: Insufficient documentation

## 2023-12-29 DIAGNOSIS — I110 Hypertensive heart disease with heart failure: Secondary | ICD-10-CM | POA: Diagnosis not present

## 2023-12-29 DIAGNOSIS — E119 Type 2 diabetes mellitus without complications: Secondary | ICD-10-CM | POA: Diagnosis not present

## 2023-12-29 DIAGNOSIS — I2510 Atherosclerotic heart disease of native coronary artery without angina pectoris: Secondary | ICD-10-CM | POA: Diagnosis not present

## 2023-12-29 DIAGNOSIS — T81328A Disruption or dehiscence of closure of other specified internal operation (surgical) wound, initial encounter: Secondary | ICD-10-CM

## 2023-12-29 DIAGNOSIS — T85898A Other specified complication of other internal prosthetic devices, implants and grafts, initial encounter: Secondary | ICD-10-CM | POA: Diagnosis not present

## 2023-12-29 DIAGNOSIS — Z7982 Long term (current) use of aspirin: Secondary | ICD-10-CM | POA: Insufficient documentation

## 2023-12-29 DIAGNOSIS — L089 Local infection of the skin and subcutaneous tissue, unspecified: Principal | ICD-10-CM | POA: Diagnosis present

## 2023-12-29 LAB — GLUCOSE, CAPILLARY
Glucose-Capillary: 102 mg/dL — ABNORMAL HIGH (ref 70–99)
Glucose-Capillary: 75 mg/dL (ref 70–99)
Glucose-Capillary: 78 mg/dL (ref 70–99)
Glucose-Capillary: 83 mg/dL (ref 70–99)
Glucose-Capillary: 105 mg/dL — ABNORMAL HIGH (ref 70–99)

## 2023-12-29 SURGERY — IRRIGATION AND DEBRIDEMENT WOUND
Anesthesia: General | Site: Chest

## 2023-12-29 MED ORDER — CEFAZOLIN SODIUM-DEXTROSE 2-3 GM-%(50ML) IV SOLR
INTRAVENOUS | Status: DC | PRN
  Administered 2023-12-29: 2 g via INTRAVENOUS

## 2023-12-29 MED ORDER — TRAMADOL HCL 50 MG PO TABS
50.0000 mg | ORAL_TABLET | Freq: Four times a day (QID) | ORAL | 0 refills | Status: AC | PRN
Start: 2023-12-29 — End: ?

## 2023-12-29 MED ORDER — ACETAMINOPHEN 500 MG PO TABS
1000.0000 mg | ORAL_TABLET | Freq: Four times a day (QID) | ORAL | Status: DC | PRN
  Administered 2023-12-29 – 2023-12-30 (×2): 1000 mg via ORAL
  Filled 2023-12-29 (×2): qty 2

## 2023-12-29 MED ORDER — INSULIN ASPART 100 UNIT/ML IJ SOLN: 0.0000 [IU] | Freq: Every day | INTRAMUSCULAR | Status: DC

## 2023-12-29 MED ORDER — MELATONIN 3 MG PO TABS
3.0000 mg | ORAL_TABLET | Freq: Every day | ORAL | Status: DC
  Administered 2023-12-29: 3 mg via ORAL
  Filled 2023-12-29: qty 1

## 2023-12-29 MED ORDER — FENTANYL CITRATE (PF) 100 MCG/2ML IJ SOLN
25.0000 ug | INTRAMUSCULAR | Status: DC | PRN
  Administered 2023-12-29: 25 ug via INTRAVENOUS

## 2023-12-29 MED ORDER — OXYCODONE HCL 5 MG PO TABS
5.0000 mg | ORAL_TABLET | Freq: Once | ORAL | Status: AC
  Administered 2023-12-29: 5 mg via ORAL

## 2023-12-29 MED ORDER — ROCURONIUM BROMIDE 10 MG/ML (PF) SYRINGE
PREFILLED_SYRINGE | INTRAVENOUS | Status: DC | PRN
Start: 2023-12-29 — End: 2023-12-29
  Administered 2023-12-29: 50 mg via INTRAVENOUS

## 2023-12-29 MED ORDER — ALUM & MAG HYDROXIDE-SIMETH 200-200-20 MG/5ML PO SUSP: 15.0000 mL | Freq: Four times a day (QID) | ORAL | Status: DC | PRN

## 2023-12-29 MED ORDER — LACTATED RINGERS IV SOLN: INTRAVENOUS | Status: DC

## 2023-12-29 MED ORDER — ASPIRIN 81 MG PO TBEC
81.0000 mg | DELAYED_RELEASE_TABLET | Freq: Every day | ORAL | Status: DC
  Administered 2023-12-29 – 2023-12-30 (×2): 81 mg via ORAL
  Filled 2023-12-29 (×2): qty 1

## 2023-12-29 MED ORDER — TRAMADOL HCL 50 MG PO TABS: 50.0000 mg | ORAL_TABLET | Freq: Four times a day (QID) | ORAL | Status: DC | PRN

## 2023-12-29 MED ORDER — CHLORHEXIDINE GLUCONATE 0.12 % MT SOLN
15.0000 mL | Freq: Once | OROMUCOSAL | Status: AC
  Administered 2023-12-29: 15 mL via OROMUCOSAL
  Filled 2023-12-29: qty 15

## 2023-12-29 MED ORDER — INSULIN ASPART 100 UNIT/ML IJ SOLN: 0.0000 [IU] | INTRAMUSCULAR | Status: DC | PRN

## 2023-12-29 MED ORDER — ONDANSETRON HCL 4 MG/2ML IJ SOLN
INTRAMUSCULAR | Status: AC
  Filled 2023-12-29: qty 2

## 2023-12-29 MED ORDER — CARVEDILOL 3.125 MG PO TABS
3.1250 mg | ORAL_TABLET | Freq: Two times a day (BID) | ORAL | Status: DC
  Administered 2023-12-29 – 2023-12-30 (×2): 3.125 mg via ORAL
  Filled 2023-12-29 (×2): qty 1

## 2023-12-29 MED ORDER — ONDANSETRON 4 MG PO TBDP: 8.0000 mg | ORAL_TABLET | Freq: Three times a day (TID) | ORAL | Status: DC | PRN

## 2023-12-29 MED ORDER — GABAPENTIN 100 MG PO CAPS
100.0000 mg | ORAL_CAPSULE | Freq: Two times a day (BID) | ORAL | Status: DC
  Administered 2023-12-29 – 2023-12-30 (×2): 100 mg via ORAL
  Filled 2023-12-29 (×2): qty 1

## 2023-12-29 MED ORDER — EZETIMIBE 10 MG PO TABS
10.0000 mg | ORAL_TABLET | Freq: Every day | ORAL | Status: DC
  Administered 2023-12-30: 10 mg via ORAL
  Filled 2023-12-29: qty 1

## 2023-12-29 MED ORDER — SACUBITRIL-VALSARTAN 24-26 MG PO TABS
1.0000 | ORAL_TABLET | Freq: Two times a day (BID) | ORAL | Status: DC
  Administered 2023-12-30: 1 via ORAL
  Filled 2023-12-29: qty 1

## 2023-12-29 MED ORDER — PROPOFOL 500 MG/50ML IV EMUL
INTRAVENOUS | Status: DC | PRN
  Administered 2023-12-29: 125 ug/kg/min via INTRAVENOUS

## 2023-12-29 MED ORDER — 0.9 % SODIUM CHLORIDE (POUR BTL) OPTIME
TOPICAL | Status: DC | PRN
  Administered 2023-12-29 (×3): 1000 mL

## 2023-12-29 MED ORDER — FENTANYL CITRATE (PF) 250 MCG/5ML IJ SOLN
INTRAMUSCULAR | Status: DC | PRN
Start: 2023-12-29 — End: 2023-12-29
  Administered 2023-12-29 (×2): 50 ug via INTRAVENOUS

## 2023-12-29 MED ORDER — DAPAGLIFLOZIN PROPANEDIOL 10 MG PO TABS
10.0000 mg | ORAL_TABLET | Freq: Every day | ORAL | Status: DC
  Administered 2023-12-30: 10 mg via ORAL
  Filled 2023-12-29: qty 1

## 2023-12-29 MED ORDER — ORAL CARE MOUTH RINSE: 15.0000 mL | Freq: Once | OROMUCOSAL | Status: AC

## 2023-12-29 MED ORDER — VANCOMYCIN HCL 1000 MG IV SOLR
INTRAVENOUS | Status: AC
  Filled 2023-12-29: qty 20

## 2023-12-29 MED ORDER — LIDOCAINE HCL (PF) 1 % IJ SOLN
INTRAMUSCULAR | Status: AC
  Filled 2023-12-29: qty 30

## 2023-12-29 MED ORDER — NOREPINEPHRINE 4 MG/250ML-% IV SOLN
INTRAVENOUS | Status: DC | PRN
  Administered 2023-12-29: 2 ug/min via INTRAVENOUS

## 2023-12-29 MED ORDER — INSULIN ASPART PROT & ASPART (70-30 MIX) 100 UNIT/ML ~~LOC~~ SUSP
30.0000 [IU] | Freq: Two times a day (BID) | SUBCUTANEOUS | Status: DC
Start: 2023-12-30 — End: 2023-12-30
  Administered 2023-12-30: 30 [IU] via SUBCUTANEOUS
  Filled 2023-12-29: qty 10

## 2023-12-29 MED ORDER — FENTANYL CITRATE (PF) 100 MCG/2ML IJ SOLN
INTRAMUSCULAR | Status: AC
  Administered 2023-12-29: 25 ug via INTRAVENOUS
  Filled 2023-12-29: qty 2

## 2023-12-29 MED ORDER — ONDANSETRON HCL 4 MG/2ML IJ SOLN
INTRAMUSCULAR | Status: DC | PRN
  Administered 2023-12-29 (×2): 4 mg via INTRAVENOUS

## 2023-12-29 MED ORDER — SPIRONOLACTONE 25 MG PO TABS
25.0000 mg | ORAL_TABLET | Freq: Every day | ORAL | Status: DC
Start: 2023-12-30 — End: 2023-12-30
  Administered 2023-12-30: 25 mg via ORAL
  Filled 2023-12-29: qty 1

## 2023-12-29 MED ORDER — SUGAMMADEX SODIUM 200 MG/2ML IV SOLN
INTRAVENOUS | Status: DC | PRN
Start: 2023-12-29 — End: 2023-12-29
  Administered 2023-12-29: 200 mg via INTRAVENOUS

## 2023-12-29 MED ORDER — PROPOFOL 10 MG/ML IV BOLUS
INTRAVENOUS | Status: AC
  Filled 2023-12-29: qty 20

## 2023-12-29 MED ORDER — FUROSEMIDE 40 MG PO TABS
40.0000 mg | ORAL_TABLET | Freq: Every day | ORAL | Status: DC
  Administered 2023-12-30: 40 mg via ORAL
  Filled 2023-12-29: qty 1

## 2023-12-29 MED ORDER — OXYCODONE HCL 5 MG PO TABS
ORAL_TABLET | ORAL | Status: AC
  Filled 2023-12-29: qty 1

## 2023-12-29 MED ORDER — LIDOCAINE 2% (20 MG/ML) 5 ML SYRINGE
INTRAMUSCULAR | Status: DC | PRN
  Administered 2023-12-29: 40 mg via INTRAVENOUS

## 2023-12-29 MED ORDER — PANTOPRAZOLE SODIUM 40 MG PO TBEC
40.0000 mg | DELAYED_RELEASE_TABLET | Freq: Every day | ORAL | Status: DC
  Administered 2023-12-30: 40 mg via ORAL
  Filled 2023-12-29: qty 1

## 2023-12-29 MED ORDER — INSULIN ASPART 100 UNIT/ML IJ SOLN
0.0000 [IU] | Freq: Three times a day (TID) | INTRAMUSCULAR | Status: DC
  Administered 2023-12-30: 4 [IU] via SUBCUTANEOUS

## 2023-12-29 MED ORDER — FENTANYL CITRATE (PF) 250 MCG/5ML IJ SOLN
INTRAMUSCULAR | Status: AC
  Filled 2023-12-29: qty 5

## 2023-12-29 MED ORDER — PROPOFOL 10 MG/ML IV BOLUS
INTRAVENOUS | Status: DC | PRN
Start: 2023-12-29 — End: 2023-12-29
  Administered 2023-12-29: 50 mg via INTRAVENOUS
  Administered 2023-12-29: 40 mg via INTRAVENOUS

## 2023-12-29 MED ORDER — BUPIVACAINE HCL (PF) 0.5 % IJ SOLN
INTRAMUSCULAR | Status: AC
  Filled 2023-12-29: qty 30

## 2023-12-29 SURGICAL SUPPLY — 49 items
BLADE CLIPPER SURG (BLADE) ×4 IMPLANT
BLADE SURG 10 STRL SS (BLADE) ×2 IMPLANT
CANISTER SUCT 3000ML PPV (MISCELLANEOUS) ×4 IMPLANT
CANISTER WOUNDNEG PRESSURE 500 (CANNISTER) IMPLANT
CLEANER TIP ELECTROSURG 2X2 (MISCELLANEOUS) IMPLANT
CLIP TI MEDIUM 24 (CLIP) IMPLANT
CLIP TI MEDIUM 6 (CLIP) ×2 IMPLANT
CLIP TI WIDE RED SMALL 24 (CLIP) IMPLANT
CNTNR URN SCR LID CUP LEK RST (MISCELLANEOUS) IMPLANT
DRAPE CHEST BREAST 15X10 FENES (DRAPES) IMPLANT
DRAPE DERMATAC (DRAPES) IMPLANT
DRAPE INCISE IOBAN 66X45 STRL (DRAPES) IMPLANT
DRAPE LAPAROSCOPIC ABDOMINAL (DRAPES) ×4 IMPLANT
DRAPE WARM FLUID 44X44 (DRAPES) IMPLANT
DRSG VAC GRANUFOAM SM (GAUZE/BANDAGES/DRESSINGS) IMPLANT
ELECT REM PT RETURN 9FT ADLT (ELECTROSURGICAL) ×4 IMPLANT
ELECTRODE REM PT RTRN 9FT ADLT (ELECTROSURGICAL) ×4 IMPLANT
GAUZE 4X4 16PLY ~~LOC~~+RFID DBL (SPONGE) ×4 IMPLANT
GAUZE PAD ABD 8X10 STRL (GAUZE/BANDAGES/DRESSINGS) IMPLANT
GAUZE SPONGE 4X4 12PLY STRL (GAUZE/BANDAGES/DRESSINGS) ×4 IMPLANT
GLOVE SS BIOGEL STRL SZ 7.5 (GLOVE) ×4 IMPLANT
GLOVE SURG SIGNA 7.5 PF LTX (GLOVE) ×6 IMPLANT
GOWN STRL REUS W/ TWL LRG LVL3 (GOWN DISPOSABLE) ×6 IMPLANT
GOWN STRL REUS W/ TWL XL LVL3 (GOWN DISPOSABLE) ×4 IMPLANT
HEMOSTAT POWDER SURGIFOAM 1G (HEMOSTASIS) IMPLANT
KIT BASIN OR (CUSTOM PROCEDURE TRAY) ×4 IMPLANT
KIT TURNOVER KIT B (KITS) ×2 IMPLANT
NDL 25GX 5/8IN NON SAFETY (NEEDLE) IMPLANT
NEEDLE 25GX 5/8IN NON SAFETY (NEEDLE) IMPLANT
NS IRRIG 1000ML POUR BTL (IV SOLUTION) ×4 IMPLANT
PACK CHEST (CUSTOM PROCEDURE TRAY) ×2 IMPLANT
PAD ARMBOARD POSITIONER FOAM (MISCELLANEOUS) ×8 IMPLANT
PENCIL BUTTON HOLSTER BLD 10FT (ELECTRODE) IMPLANT
SET HNDPC FAN SPRY TIP SCT (DISPOSABLE) IMPLANT
SUT STEEL 6MS V (SUTURE) IMPLANT
SUT STEEL STERNAL CCS#1 18IN (SUTURE) IMPLANT
SUT STEEL SZ 6 DBL 3X14 BALL (SUTURE) IMPLANT
SUT VIC AB 1 CTX36XBRD ANBCTR (SUTURE) ×4 IMPLANT
SUT VIC AB 2-0 CTX 27 (SUTURE) ×4 IMPLANT
SUT VIC AB 2-0 SH 27XBRD (SUTURE) ×2 IMPLANT
SUT VIC AB 3-0 X1 27 (SUTURE) ×6 IMPLANT
SWAB COLLECTION DEVICE MRSA (MISCELLANEOUS) IMPLANT
SWAB CULTURE ESWAB REG 1ML (MISCELLANEOUS) IMPLANT
SYR 5ML LL (SYRINGE) IMPLANT
SYR CONTROL 10ML LL (SYRINGE) IMPLANT
TOWEL GREEN STERILE (TOWEL DISPOSABLE) ×4 IMPLANT
TOWEL GREEN STERILE FF (TOWEL DISPOSABLE) ×4 IMPLANT
TRAY FOLEY MTR SLVR 14FR STAT (SET/KITS/TRAYS/PACK) IMPLANT
WATER STERILE IRR 1000ML POUR (IV SOLUTION) ×4 IMPLANT

## 2023-12-29 NOTE — Care Management (Signed)
 ED RNCM received call from [facility] PACU RN concerning a wound vac and HH  for discharge home from [facility] PACU. Reviewed chart [facility] insurance. Contacted [employer] Rep Alvis Lemmings [phone] she is initiating insurance authorization, awaiting authorization.  Also contacted [facility] in Network for [place], accepted referral for start of care 3/17 as per [facility] order.  Awaiting info from [employer] to provide update on auth for  home wound vac.

## 2023-12-29 NOTE — Care Management (Signed)
 Spoke with Dawn with [employer] was out of Network. Attempted to arrange home wound vac with [facility], main office contacted [phone]  I was informed that the office would be closing at 5p and they would not be able to process the wound vac today. Updated Whitney Muse RN Director of PACU who will contact Dr. Dorris Fetch to admit patient pending the processing of home wound vac. TOC will follow   Michel Bickers RN, BSN CNOR ED RN Care Manager [phone]

## 2023-12-29 NOTE — Op Note (Signed)
 NAME: Diana [name], FORGIONE MEDICAL RECORD NO: [id number] ACCOUNT NO: [id number] DATE OF BIRTH: [birth year] FACILITY: [facility] LOCATION: [facility] PHYSICIAN: Salvatore Decent. Dorris Fetch, MD  Operative Report   DATE OF PROCEDURE: 12/29/2023  PREOPERATIVE DIAGNOSIS:  Sternal wound dehiscence.  POSTOPERATIVE DIAGNOSIS:  Sternal wound dehiscence.  PROCEDURE:  Incision and drainage of sternal wound with excisional debridement, sternal wire removal, application of wound VAC (wound size 7 x 5 x 5 cm).  SURGEON:  Salvatore Decent. Dorris Fetch, MD.  ASSISTANT:  Raynelle Bring, RNFA.  ANESTHESIA:  General.  FINDINGS:  Dehiscence of lower portion of sternal wound with wire pull-through, granulation tissue, no evidence of deep wound infection, debrided to clean viable tissue.  Resulting wound 7 x 5 x 5 cm  CLINICAL NOTE:  [name] is a 78 year old woman who had undergone coronary bypass grafting and mitral valve repair back in 08/2023.  She developed drainage from the inferior aspect of her sternal incision.  She had incision and drainage in the office and saline wet to dry dressing changes and the wound healed over the course of about 10 days.  Chest x-ray showed pull-through of the lower two sternal wires.  Recently, she developed recurrent drainage and then an area of fluctuance.  She was advised to  undergo incision and drainage and VAC placement.  The indications, risks, benefits, and alternatives were discussed in detail with the patient. She understood and accepted the risks and agreed to proceed.  OPERATIVE NOTE:  [name] was brought to the operating room on 12/29/2023. She had induction of general anesthesia.  Sequential compression devices were placed on the calves for DVT prophylaxis.  A Bair Hugger was placed for active warming. Intravenous Ancef was administered. The chest was prepped and draped in the usual sterile fashion.  A timeout was performed.  An incision was made incorporating the fluctuant area.  This was an elliptical incision over the inferior aspect of the sternum.  It was carried through the skin and subcutaneous tissue.  The tissue was friable and did bleed easily. The fluctuant area was maintained intact until approaching the xiphoid process and the inferior aspect of the "mass" was excised.  The base of the wound had granulation tissue.  There was no purulence or evidence of infection. The xiphoid and lower sternal cartilages were excised.  The wound was copiously irrigated with saline.  The granulation tissue was debrided back to healthy viable tissue.  The wound was again irrigated. Final inspection was made for hemostasis.  A VAC sponge was cut to fit the wound.  The final dimensions of the wound were 7 cm in length, 5 cm in width, and 5 cm in depth. The vacuum was applied with a good seal.  All sponge, needle, and instrument counts were correct at the end of the procedure.  The patient was transported from the operating room to the post-anesthesia care unit in good condition.   NIK D: 12/29/2023 4:52:36 pm T: 12/29/2023 10:02:00 pm  JOB: [id number]/ [id number]

## 2023-12-29 NOTE — Anesthesia Procedure Notes (Signed)
 Procedure Name: Intubation Date/Time: 12/29/2023 12:56 PM  Performed by: Samara Deist, CRNAPre-anesthesia Checklist: Patient identified, Emergency Drugs available, Suction available and Patient being monitored Patient Re-evaluated:Patient Re-evaluated prior to induction Oxygen Delivery Method: Circle System Utilized Preoxygenation: Pre-oxygenation with 100% oxygen Induction Type: IV induction Ventilation: Mask ventilation without difficulty Grade View: Grade I Tube type: Oral Tube size: 7.0 mm Number of attempts: 1 Airway Equipment and Method: Stylet and Oral airway Placement Confirmation: ETT inserted through vocal cords under direct vision, positive ETCO2 and breath sounds checked- equal and bilateral Tube secured with: Tape Dental Injury: Teeth and Oropharynx as per pre-operative assessment

## 2023-12-29 NOTE — Brief Op Note (Signed)
 12/29/2023  2:51 PM  PATIENT:  Diana [name]  78 y.o. female  PRE-OPERATIVE DIAGNOSIS:  STERNAL WOUND DRAINAGE  POST-OPERATIVE DIAGNOSIS:  STERNAL WOUND DRAINAGE  PROCEDURE:  Procedure(s) with comments: IRRIGATION AND DEBRIDEMENT WOUND (N/A) - I&D sternal wound APPLICATION, WOUND VAC (N/A) REMOVAL, STERNAL WIRE (N/A) REMOVAL, STERNAL WIRE (N/A)  SURGEON:  Surgeons and Role: Panel 1:    * [name], MD - Primary Panel 2:    * [name], MD - Primary  PHYSICIAN ASSISTANT:   ASSISTANTS: Raynelle Bring RNFA   ANESTHESIA:   general  EBL:  minimal   BLOOD ADMINISTERED:none  DRAINS:  VAC in sternal wound    LOCAL MEDICATIONS USED:  NONE  SPECIMEN:  Source of Specimen:  sternal wound  DISPOSITION OF SPECIMEN:   Micro, Path  COUNTS:  YES  TOURNIQUET:  * No tourniquets in log *  DICTATION: .Other Dictation: Dictation Number -  PLAN OF CARE: Discharge to home after PACU  PATIENT DISPOSITION:  PACU - hemodynamically stable.   Delay start of Pharmacological VTE agent (>24hrs) due to surgical blood loss or risk of bleeding: not applicable

## 2023-12-29 NOTE — Discharge Instructions (Addendum)
 Do not drive or engage in heavy physical activity for 24 hours.  You may shower tomorrow.  Take acetaminophen (Tylenol) 1000 mg ( 2 extra strength) 4 times daily for 3 days, then can use as needed.  You have a prescription for tramadol (Ultram) a narcotic pain reliever.  You may take 1 to 2 tablets (50- 100 mg) by mouth 4 times daily as needed for moderate to severe pain, which you can use along with the Tylenol.  Resume your normal medications this evening except apixaban (Eliquis).  You may resume  Eliquis tomorrow evening.  We will arrange a home health nurse for wound VAC changes. Wound vac changes are to be done Monday and Friday. [facility] nurse first appointment to be Friday 01/05/2024.  Call [phone] if you develop fever > 101 F, chills, or notice excessive pain, swelling, redness, tenderness or drainage around the [facility]  dressing  Dr. Sunday Corn office will call you Monday and arrange follow up Monday or Tuesday. Please bring wound VAC and supplies to this appointment.

## 2023-12-29 NOTE — Interval H&P Note (Signed)
 History and Physical Interval Note:  12/29/2023 1:27 PM  Diana [name]  has presented today for surgery, with the diagnosis of STERNAL WOUND DRAINAGE.  The various methods of treatment have been discussed with the patient and family. After consideration of risks, benefits and other options for treatment, the patient has consented to  Procedure(s) with comments: IRRIGATION AND DEBRIDEMENT WOUND (N/A) - I&D sternal wound APPLICATION, WOUND VAC (N/A) REMOVAL, STERNAL WIRE (N/A) as a surgical intervention.  The patient's history has been reviewed, patient examined, no change in status, stable for surgery.  I have reviewed the patient's chart and labs.  Questions were answered to the patient's satisfaction.     [name]

## 2023-12-29 NOTE — Transfer of Care (Signed)
 Immediate Anesthesia Transfer of Care Note  Patient: Diana [name]  Procedure(s) Performed: IRRIGATION AND DEBRIDEMENT WOUND APPLICATION, WOUND VAC REMOVAL, STERNAL WIRE (Chest) REMOVAL, STERNAL WIRE (Chest)  Patient Location: PACU  Anesthesia Type:General  Level of Consciousness: awake, alert , and oriented  Airway & Oxygen Therapy: Patient Spontanous Breathing and Patient connected to nasal cannula oxygen  Post-op Assessment: Report given to RN and Post -op Vital signs reviewed and stable  Post vital signs: Reviewed and stable  Last Vitals:  Vitals Value Taken Time  BP 98/80 12/29/23 1503  Temp    Pulse 92 12/29/23 1508  Resp 20 12/29/23 1508  SpO2 93 % 12/29/23 1508  Vitals shown include unfiled device data.  Last Pain:  Vitals:   12/29/23 1018  TempSrc:   PainSc: 0-No pain         Complications: No notable events documented.

## 2023-12-30 DIAGNOSIS — T81329A Deep disruption or dehiscence of operation wound, unspecified, initial encounter: Secondary | ICD-10-CM | POA: Diagnosis not present

## 2023-12-30 LAB — GLUCOSE, CAPILLARY
Glucose-Capillary: 166 mg/dL — ABNORMAL HIGH (ref 70–99)
Glucose-Capillary: 114 mg/dL — ABNORMAL HIGH (ref 70–99)

## 2023-12-30 NOTE — Discharge Summary (Signed)
 [address]       [place] [address]             [phone]    Physician Discharge Summary  Patient ID: Diana [name] MRN: [id number] DOB/AGE: September 12, 1946 78 y.o.  Admit date: 12/29/2023 Discharge date: 12/30/2023  Admission Diagnoses:  Patient Active Problem List   Diagnosis Date Noted   Sternal wound infection 12/29/2023   Sternal wound dehiscence 12/29/2023   S/P mitral valve repair 09/06/2023   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/2023   Coronary artery disease 09/06/2023   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, <= 40%) (HCC) 08/28/2023   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, <= 40%) and combined systolic and diastolic dysfunction (HCC) 08/28/2023   COPD exacerbation (HCC) 11/13/2016   Diabetes mellitus with diabetic neuropathy (HCC) 08/24/2015   Dyslipidemia    Diastolic dysfunction-garde 1    CAD-staged LAD DES (July 2011), RCA DES (Aug 2011) 05/24/2013   DM2 (diabetes mellitus, type 2) (HCC) 05/24/2013   HTN (hypertension) 05/24/2013   Obesity (BMI 30-39.9) 05/24/2013     Discharge Diagnoses:  Patient Active Problem List   Diagnosis Date Noted   Sternal wound infection 12/29/2023   Sternal wound dehiscence 12/29/2023   S/P mitral valve repair 09/06/2023   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/2023   Coronary artery disease 09/06/2023   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, <= 40%) (HCC) 08/28/2023   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, <= 40%) and combined systolic and diastolic dysfunction (HCC) 08/28/2023   COPD exacerbation (HCC) 11/13/2016   Diabetes mellitus with diabetic neuropathy (HCC) 08/24/2015   Dyslipidemia    Diastolic dysfunction-garde 1    CAD-staged LAD DES (July 2011), RCA DES (Aug 2011) 05/24/2013   DM2 (diabetes mellitus, type 2) (HCC) 05/24/2013   HTN (hypertension) 05/24/2013   Obesity (BMI 30-39.9) 05/24/2013    Discharged Condition: Stable  HPI: This is a 78 year old woman with a history of CAD, severe mitral regurgitation, pulmonary hypertension, heart failure, hypertension, hyperlipidemia, type 2 diabetes, and obesity.  Presented with chest pain and shortness of breath last fall.  Underwent coronary bypass grafting x 4 and mitral valve repair on 09/06/2023.  Postoperatively she had atrial fibrillation and went home on amiodarone and Eliquis.   I saw her in February with swelling and drainage from the inferior aspect of her sternal incision.  Chest x-ray showed that the lower to sternal wires had pulled through.  I did an I&D in the office and she treated with saline wet-to-dry dressing changes.  It healed up over the course of about 10 days.   Last week she again noted some drainage and then again developed an area of fluctuance on the lower portion of the sternal wound.  No fevers or chills.  Daughter saw the drainage, it was clear serous appearing.  Not purulent.  Dr. Dorris Fetch discussed the need for to take her to the operating room and incise and drain the area under general anesthesia. Possible sternal wire removal and possible VAC placement depending on findings. Potential risks, benefits, and complications of the surgery were discussed with the patient and she agreed to proceed with surgery.  Hospital Course: Patient underwent incision and drainage of sternal wound with excisional debridement, sternal wire removal, application of wound VAC (wound size 7 x 5 x 5 cm). Because the office was closed, wound vac with  [facility] was not able to be processed. She was admitted for overnight stay. Unfortunately, per [facility] nurse, [facility] would start processing wound vac today;however, will be Monday morning before wound vac can be delivered. After discussion with Dr. Dorris Fetch, it was decided to use Prevena wound vac kit (the tubing and vac) so that hospital placed vac would not leave the hospital.  Prevena wound vac is functioning. I spoke with Kathi Der RN TOC. She is to continue with arranging for regular wound vac (not Prevena) and supplies as initially requested. Wound vac paperwork being processed and expected to be delivered on Monday. HH RN will come out Friday 03/21. Patient will bring wound vac and supplies to office appointment either Monday or Tuesday and TCTS will do first vac change.  We explained to patient and her daughter that Chester Holstein needs to be fully charged then she will be discharged today.   Treatments: surgery:  Incision and drainage of sternal wound with excisional debridement, sternal wire removal, application of wound VAC, wound size 7 x 5 x 5 cm by Dr. Dorris Fetch on 12/29/2023.  Discharge Exam: Blood pressure (!) 127/57, pulse 87, temperature 98.5 F (36.9 C), temperature source Oral, resp. rate 18, height 5\' 4"  (1.626 m), weight 81.2 kg, SpO2 91%. Cardiovascular: RRR, Pulmonary: Clear to auscultation bilaterally; Wound: Vac in place and functioning. Serosanguinous drainage from lower sternal wound   Discharge Medications: The patient has been discharged on:  Allergies as of 12/30/2023       Reactions   Keflex [cephalexin] Hives   Lisinopril Cough   Sulfa Antibiotics Hives        Medication List     TAKE these medications    acetaminophen 325 MG tablet Commonly known as: TYLENOL Take 2 tablets (650 mg total) by mouth every 6 (six) hours as needed for mild pain (pain score 1-3).   albuterol 108 (90 Base) MCG/ACT inhaler Commonly known as: VENTOLIN HFA Inhale 1-2 puffs into the lungs every 6 (six) hours as needed for shortness of breath or wheezing.   apixaban 5 MG Tabs tablet Commonly known as: ELIQUIS Take 1 tablet (5 mg total) by mouth 2 (two) times daily.   aspirin EC 81 MG tablet Take 1 tablet (81 mg total) by mouth daily. Swallow whole.   BD Insulin Syringe U/F 1/2Unit 31G X 5/16" 0.3 ML Misc Generic drug: Insulin Syringe-Needle  U-100   carvedilol 3.125 MG tablet Commonly known as: COREG Take 1 tablet (3.125 mg total) by mouth 2 (two) times daily.   cyanocobalamin 1000 MCG tablet Commonly known as: VITAMIN B12 Take 2,000 mcg by mouth daily.   dapagliflozin propanediol 10 MG Tabs tablet Commonly known as: FARXIGA Take 1 tablet (10 mg total) by mouth daily.   Entresto 24-26 MG Generic drug: sacubitril-valsartan Take 1 tablet by mouth 2 (two) times daily.   ezetimibe 10 MG tablet Commonly known as: ZETIA Take 1 tablet (10 mg total) by mouth daily.   furosemide 40 MG tablet Commonly known as: LASIX Take 1 tablet (40 mg total) by mouth daily.   gabapentin 100 MG capsule Commonly known as: NEURONTIN Take 100 mg by mouth 2 (two) times daily.   loratadine 10 MG tablet Commonly known as: CLARITIN Take 10 mg by mouth daily.   NovoLIN 70/30 (70-30) 100 UNIT/ML injection Generic drug: insulin NPH-regular Human Inject 30-60 Units into the skin 2 (two) times daily.   ondansetron 8 MG disintegrating tablet Commonly known as: ZOFRAN-ODT 8 mg every 8 (  eight) hours as needed for nausea.   oyster calcium 500 MG Tabs tablet Take 500 mg of elemental calcium by mouth 2 (two) times a week.   pantoprazole 40 MG tablet Commonly known as: PROTONIX Take 40 mg by mouth daily.   spironolactone 25 MG tablet Commonly known as: ALDACTONE Take 1 tablet (25 mg total) by mouth daily.   traMADol 50 MG tablet Commonly known as: ULTRAM Take 1-2 tablets (50-100 mg total) by mouth every 6 (six) hours as needed for moderate pain (pain score 4-6) or severe pain (pain score 7-10). What changed:  how much to take when to take this reasons to take this   vitamin A 13086 UNIT capsule Take 25,000 Units by mouth daily.        Follow-up Information     [name], MD. Go on 01/08/2024.   Specialty: Cardiothoracic Surgery Why: Office will call with appointment time. Please bring wound vac sponge to office  appointment so that vac can be change. Contact information: [address] [place] [address] [phone]                 Signed:  Elenore Rota 12/30/2023, 10:55 AM

## 2023-12-30 NOTE — Care Management Obs Status (Signed)
 MEDICARE OBSERVATION STATUS NOTIFICATION   Patient Details  Name: Diana [name] MRN: [id number] Date of Birth: 03/05/1946   Medicare Observation Status Notification Given:  Yes    Tanisha Lutes G., RN 12/30/2023, 8:59 AM

## 2023-12-30 NOTE — Hospital Course (Signed)
 HPI: This is a 78 year old woman with a history of CAD, severe mitral regurgitation, pulmonary hypertension, heart failure, hypertension, hyperlipidemia, type 2 diabetes, and obesity.  Presented with chest pain and shortness of breath last fall.  Underwent coronary bypass grafting x 4 and mitral valve repair on 09/06/2023.  Postoperatively she had atrial fibrillation and went home on amiodarone and Eliquis.   I saw her in February with swelling and drainage from the inferior aspect of her sternal incision.  Chest x-ray showed that the lower to sternal wires had pulled through.  I did an I&D in the office and she treated with saline wet-to-dry dressing changes.  It healed up over the course of about 10 days.   Last week she again noted some drainage and then again developed an area of fluctuance on the lower portion of the sternal wound.  No fevers or chills.  Daughter saw the drainage, it was clear serous appearing.  Not purulent.  Dr. Dorris Fetch discussed the need for to take her to the operating room and incise and drain the area under general anesthesia. Possible sternal wire removal and possible VAC placement depending on findings. Potential risks, benefits, and complications of the surgery were discussed with the patient and she agreed to proceed with surgery.  Hospital Course: Patient underwent incision and drainage of sternal wound with excisional debridement, sternal wire removal, application of wound VAC (wound size 7 x 5 x 5 cm). Because the office was closed, wound vac with [facility] was not able to be processed. She was admitted for overnight stay. Unfortunately, per [facility] nurse, [facility] would start processing wound vac today;however, will be Monday morning before wound vac can be delivered. After discussion with Dr. Dorris Fetch, it was decided to use Prevena wound vac kit (the tubing and vac) so that hospital placed vac would not leave the hospital. Prevena wound vac is functioning. I  spoke with Kathi Der RN TOC. She is to continue with arranging for regular wound vac (not Prevena) and supplies as initially requested. Wound vac paperwork being processed and expected to be delivered on Monday. HH RN will come out Friday 03/21. Patient will bring wound vac and supplies to office appointment either Monday or Tuesday and TCTS will do first vac change.  We explained to patient and her daughter that Chester Holstein needs to be fully charged then she will be discharged today.

## 2023-12-30 NOTE — Progress Notes (Addendum)
 RNCM received phone call from [facility], [place], stating patient was being discharged home today with Prevena vac with plans to have wound vac from [facility] placed Monday, to be delivered to patient's home and patient is to follow up in office.   Mitch at [employer] notified.  Denyse Amass at [facility] notified of [facility] services to start Friday 3/21, per Doree Fudge, PA.

## 2023-12-30 NOTE — Progress Notes (Signed)
 RNCM spoke with Diana [name] at [employer]-will start processing wound vac today, however will be Monday morning before wound vac can be delivered.

## 2023-12-30 NOTE — Plan of Care (Signed)
 Patient AAOx4. Wound vac in place with dressing to sternum C/D/I. Safety precautions maintained.  Discharge instructions discussed with patient and daughter, verbalized understanding. Prevena vac fully charged. Patient left with all belongings in no acute or respiratory distress. Escorted to lobby in wc.    Problem: Metabolic: Goal: Ability to maintain appropriate glucose levels will improve Outcome: Progressing   Problem: Skin Integrity: Goal: Risk for impaired skin integrity will decrease Outcome: Progressing   Problem: Education: Goal: Knowledge of General Education information will improve Description: Including pain rating scale, medication(s)/side effects and non-pharmacologic comfort measures Outcome: Progressing

## 2023-12-30 NOTE — Progress Notes (Addendum)
      [address]       [name] [address]             [phone]        1 Day Post-Op Procedure(s) (LRB): IRRIGATION AND DEBRIDEMENT WOUND (N/A) APPLICATION, WOUND VAC (N/A) REMOVAL, STERNAL WIRE (N/A) REMOVAL, STERNAL WIRE (N/A)  Subjective: Patient without complaints this am. She wants to go home.  Objective: Vital signs in last 24 hours: Temp:  [97.2 F (36.2 C)-98.6 F (37 C)] 98.5 F (36.9 C) (03/15 0823) Pulse Rate:  [80-97] 87 (03/15 0823) Cardiac Rhythm: Normal sinus rhythm (03/14 1700) Resp:  [17-26] 18 (03/15 0823) BP: (95-141)/(45-80) 127/57 (03/15 0823) SpO2:  [91 %-100 %] 91 % (03/15 0823) Weight:  [81.2 kg] 81.2 kg (03/14 1004)   Current Weight  12/29/23 81.2 kg      Intake/Output from previous day: 03/14 0701 - 03/15 0700 In: -  Out: 10 [Blood:10]   Physical Exam:  Cardiovascular: RRR, Pulmonary: Clear to auscultation bilaterally; Wound: Vac in place and functioning. Serosanguinous drainage from lower sternal wound  Lab Results: CBC: Recent Labs    12/28/23 1050  WBC 8.4  HGB 11.1*  HCT 36.6  PLT 490*   BMET:  Recent Labs    12/28/23 1050  NA 139  K 3.4*  CL 103  CO2 23  GLUCOSE 149*  BUN 27*  CREATININE 0.99  CALCIUM 9.1    PT/INR:  Lab Results  Component Value Date   INR 1.0 12/28/2023   INR 1.5 (H) 09/06/2023   INR 1.0 09/06/2023   ABG:  INR: Will add last result for INR, ABG once components are confirmed Will add last 4 CBG results once components are confirmed  Assessment/Plan:  1. CV - SR. 2.  Pulmonary - On room air. 3. As discussed with Dr. Dorris Fetch, used Prevena wound care tubing to connect to existing wound vac (no sponge change as was in OR yesterday to place) and Prevena plugged in to have a full charge. Patient's daughter present.  I spoke with Kathi Der RN. Wound vac paperwork being processed and expected to be delivered on Monday. HH RN will come out Friday 03/21. Patient  will bring wound vac and supplies to office appointment either Monday or Tuesday and TCTS will do first vac change.  4. Patient to be discharged today. Patient's daughter already picked up prescription for Tramadol so no additional prescription necessary.    Nakota Elsen M ZimmermanPA-C 9:34 AM

## 2023-12-30 NOTE — Plan of Care (Signed)

## 2023-12-31 NOTE — Progress Notes (Signed)
 12/31/23, 1143, Communicated with Doree Fudge, PA, signature needed for wound vac via [facility] to process order. Doree Fudge, PA, wants [facility] to contact her office Monday morning for needed signature(s).  Mitch at [employer] notified to contact office Monday morning.

## 2024-01-01 ENCOUNTER — Encounter (HOSPITAL_COMMUNITY): Admission: RE | Admit: 2024-01-01 | Payer: Medicare HMO | Source: Ambulatory Visit

## 2024-01-01 ENCOUNTER — Telehealth (HOSPITAL_COMMUNITY): Payer: Self-pay | Admitting: *Deleted

## 2024-01-01 ENCOUNTER — Encounter (HOSPITAL_COMMUNITY): Payer: Self-pay | Admitting: Thoracic Surgery (Cardiothoracic Vascular Surgery)

## 2024-01-01 NOTE — Telephone Encounter (Signed)
 Jakya returned call to [facility], cardiac rehab. Lulie indicates that she is doing ok after her surgery. Evonne has a "temporary wound vac"  Waiting on the other which is on order.  Does not have a in person follow up appt scheduled as of yet, communicating with Dr. Denyce Robert office via phone.  Once the other wound vac arrives she is to let the office know so an appt can be made.  Home health has been to her home for an assessment.  Will keep [place] updated when she learns more. Alanson Aly, BSN Cardiac and Emergency planning/management officer

## 2024-01-01 NOTE — Telephone Encounter (Signed)
 Left message to call cardiac rehab. Will cancel appointments for the rest of the week. Will need clearance from Dr Dorris Fetch S/P ID and Vac surgery to return to exercise at cardiac rehab.Thayer Headings RN BSN

## 2024-01-02 ENCOUNTER — Ambulatory Visit (INDEPENDENT_AMBULATORY_CARE_PROVIDER_SITE_OTHER): Payer: Self-pay | Admitting: Thoracic Surgery (Cardiothoracic Vascular Surgery)

## 2024-01-02 ENCOUNTER — Telehealth: Payer: Self-pay

## 2024-01-02 VITALS — BP 128/73 | HR 92 | Resp 18 | Ht 64.0 in | Wt 183.0 lb

## 2024-01-02 DIAGNOSIS — Z5189 Encounter for other specified aftercare: Secondary | ICD-10-CM

## 2024-01-02 DIAGNOSIS — T81328A Disruption or dehiscence of closure of other specified internal operation (surgical) wound, initial encounter: Secondary | ICD-10-CM | POA: Diagnosis not present

## 2024-01-02 DIAGNOSIS — T81328D Disruption or dehiscence of closure of other specified internal operation (surgical) wound, subsequent encounter: Secondary | ICD-10-CM

## 2024-01-02 LAB — SURGICAL PATHOLOGY

## 2024-01-02 NOTE — Telephone Encounter (Signed)
 Sue Lush, RN with [facility] home health contacted the office stating patient has not received wound vac or supplies while she was with the patient in the home today. Patient is s/p I&D with sternal wire removal and wound vac placement. Patient was to get supplies delivered to her home Monday but they have not arrived. She will be a Monday/ Friday vac change. Patient confirmed this. Contacted [employer] referral/order was not placed with liaison.  Referral faxed to [phone]. Advised patient to contact our office back by Thursday if she has not heard from [employer] about supplies delivery. If not, patient will be scheduled to come into the office on Friday for dressing/vac change. Patient acknowledged receipt.

## 2024-01-02 NOTE — Progress Notes (Signed)
 [address]       [name] [address]             [phone]     HPI: Diana [name] returns for follow-up after recent debridement of her sternal wound.  Diana [name] is a 78 year old woman with a history of CAD, severe MR, pulmonary hypertension, heart failure, hypertension, hyperlipidemia, type 2 diabetes, and obesity.  She presented with chest pain and shortness of breath.  Underwent coronary bypass grafting x 4 and mitral valve repair on 09/06/2023.  She developed swelling and drainage in the inferior aspect of her sternal incision.  Her lower 2 sternal wires had pulled through.  We did an I&D and treated with local wound care.  It healed up but then recurred and was even more fluctuant.  Took her to the OR on Friday for a excisional debridement and also remove the 2 lower sternal wires.  A VAC was placed.  She does have some discomfort which is to be expected.  Unfortunately we still have not secured a home VAC for her.  She still has the temporary Prevena wound VAC in place.  Past Medical History:  Diagnosis Date   Arthritis    CAD (coronary artery disease) 7/12-8/12   staged LAD/RCA DES   CHF (congestive heart failure) (HCC)    COPD (chronic obstructive pulmonary disease) (HCC)    Diabetes mellitus (HCC)    Diastolic dysfunction 12/15/2012   grade 1    Dyslipidemia    Dysrhythmia    A. fib post CABG   Heart murmur    Mitral Valve repair November 2024   HTN (hypertension)    Pneumonia     Current Outpatient Medications  Medication Sig Dispense Refill   acetaminophen (TYLENOL) 325 MG tablet Take 2 tablets (650 mg total) by mouth every 6 (six) hours as needed for mild pain (pain score 1-3).     albuterol (VENTOLIN HFA) 108 (90 Base) MCG/ACT inhaler Inhale 1-2 puffs into the lungs every 6 (six) hours as needed for shortness of breath or wheezing.     apixaban (ELIQUIS) 5 MG TABS tablet Take 1 tablet (5 mg total) by mouth 2 (two) times daily. 60 tablet  5   aspirin EC 81 MG tablet Take 1 tablet (81 mg total) by mouth daily. Swallow whole.     BD INSULIN SYRINGE U/F 1/2UNIT 31G X 5/16" 0.3 ML MISC      Beta Carotene (VITAMIN A) 25000 UNIT capsule Take 25,000 Units by mouth daily.     carvedilol (COREG) 3.125 MG tablet Take 1 tablet (3.125 mg total) by mouth 2 (two) times daily. 60 tablet 3   cyanocobalamin (VITAMIN B12) 1000 MCG tablet Take 2,000 mcg by mouth daily.     dapagliflozin propanediol (FARXIGA) 10 MG TABS tablet Take 1 tablet (10 mg total) by mouth daily. 30 tablet 5   ezetimibe (ZETIA) 10 MG tablet Take 1 tablet (10 mg total) by mouth daily. 90 tablet 2   furosemide (LASIX) 40 MG tablet Take 1 tablet (40 mg total) by mouth daily. 30 tablet 3   gabapentin (NEURONTIN) 100 MG capsule Take 100 mg by mouth 2 (two) times daily.     insulin NPH-regular Human (NOVOLIN 70/30) (70-30) 100 UNIT/ML injection Inject 30-60 Units into the skin 2 (two) times daily.     loratadine (CLARITIN) 10 MG tablet Take 10 mg by mouth daily.     ondansetron (ZOFRAN-ODT) 8 MG disintegrating tablet 8  mg every 8 (eight) hours as needed for nausea.     Oyster Shell (OYSTER CALCIUM) 500 MG TABS tablet Take 500 mg of elemental calcium by mouth 2 (two) times a week.     pantoprazole (PROTONIX) 40 MG tablet Take 40 mg by mouth daily.     sacubitril-valsartan (ENTRESTO) 24-26 MG Take 1 tablet by mouth 2 (two) times daily. 60 tablet 6   spironolactone (ALDACTONE) 25 MG tablet Take 1 tablet (25 mg total) by mouth daily. 30 tablet 5   traMADol (ULTRAM) 50 MG tablet Take 1-2 tablets (50-100 mg total) by mouth every 6 (six) hours as needed for moderate pain (pain score 4-6) or severe pain (pain score 7-10). 30 tablet 0   No current facility-administered medications for this visit.    Physical Exam BP 128/73   Pulse 92   Resp 18   Ht 5\' 4"  (1.626 m)   Wt 183 lb (83 kg)   SpO2 96%   BMI 31.69 kg/m  78 year old woman in no acute distress Alert and oriented x 3 with  no focal deficits Wound VAC in place, skin edges normal   Impression: Diana [name] is a 78 year old woman with a history of CAD, severe MR, pulmonary hypertension, heart failure, hypertension, hyperlipidemia, type 2 diabetes, and obesity.  She presented with chest pain and shortness of breath.  Underwent coronary bypass grafting x 4 and mitral valve repair on 09/06/2023.  Complicated by lower sternal dehiscence.  Underwent incision and drainage of her sternal wound which was really an excisional debridement of the lower portion of the wound last week.  Has a VAC in place.  Unfortunately case management was not able to secure a home VAC for her so we sent her home with a temporary Prevena VAC.  Still awaiting approval from the home health company for home [facility].  Will leave current VAC in place.  Once the home VAC situation has been straightened out we will help her change that if need be.  Otherwise we will plan to see her back in a week.  If for some reason she cannot get a home VAC.  She will need to come back to the office later this week to have a new Prevena VAC placed.  Plan: Return in 1 week, sooner if necessary  [name], MD [facility] [phone]

## 2024-01-03 ENCOUNTER — Telehealth: Payer: Self-pay | Admitting: *Deleted

## 2024-01-03 ENCOUNTER — Encounter (HOSPITAL_COMMUNITY): Payer: Medicare HMO

## 2024-01-03 LAB — AEROBIC/ANAEROBIC CULTURE W GRAM STAIN (SURGICAL/DEEP WOUND): Culture: NO GROWTH

## 2024-01-03 NOTE — Telephone Encounter (Signed)
 Received phone call from patient stating [employer] would be delivering wound vac and supplies to her this afternoon. Patient calling about next steps. Sue Lush, [facility] RN with [facility], contacted to make aware of supplies. States she will go out tomorrow morning first thing to change VAC and place new sponge. Patient aware of plan. Dr. Dorris Fetch made aware as well. Patient to follow up in clinic next Tuesday.

## 2024-01-03 NOTE — Telephone Encounter (Signed)
 Order form for wound vac received from [employer]. Form filled out and re-faxed to [phone].

## 2024-01-05 ENCOUNTER — Encounter (HOSPITAL_COMMUNITY): Payer: Medicare HMO

## 2024-01-05 NOTE — Anesthesia Postprocedure Evaluation (Signed)
 Anesthesia Post Note  Patient: Diana [name]  Procedure(s) Performed: IRRIGATION AND DEBRIDEMENT WOUND APPLICATION, WOUND VAC REMOVAL, STERNAL WIRE (Chest) REMOVAL, STERNAL WIRE (Chest)     Patient location during evaluation: PACU Anesthesia Type: General Level of consciousness: awake and alert Pain management: pain level controlled Vital Signs Assessment: post-procedure vital signs reviewed and stable Respiratory status: spontaneous breathing, nonlabored ventilation and respiratory function stable Cardiovascular status: blood pressure returned to baseline and stable Postop Assessment: no apparent nausea or vomiting Anesthetic complications: no   No notable events documented.                Lana Flaim

## 2024-01-08 ENCOUNTER — Encounter (HOSPITAL_COMMUNITY): Payer: Medicare HMO

## 2024-01-09 ENCOUNTER — Ambulatory Visit (INDEPENDENT_AMBULATORY_CARE_PROVIDER_SITE_OTHER): Payer: Self-pay | Admitting: Thoracic Surgery (Cardiothoracic Vascular Surgery)

## 2024-01-09 ENCOUNTER — Encounter: Payer: Self-pay | Admitting: Thoracic Surgery (Cardiothoracic Vascular Surgery)

## 2024-01-09 VITALS — BP 135/74 | HR 99 | Resp 20 | Ht 64.0 in | Wt 182.2 lb

## 2024-01-09 DIAGNOSIS — Z5189 Encounter for other specified aftercare: Secondary | ICD-10-CM | POA: Insufficient documentation

## 2024-01-09 NOTE — Progress Notes (Signed)
 [address]       [name] [address]             [phone]    HPI: Diana [name] returns for follow-up of her sternal wound.  Diana [name] is a 78 year old woman with a history of CAD, severe MR, pulmonary hypertension, heart failure, hypertension, hyperlipidemia, type 2 diabetes, and obesity.  A coronary bypass grafting and mitral valve repair on 09/06/2023.  She presented back with some swelling and drainage from the inferior aspect of her sternal wound.  2 lower sternal wires had pulled through.  We did an I&D and treated with local wound care and it healed up but reoccurred about a month later with more swelling and fluctuance.  She underwent incision and drainage and sternal wire removal and VAC placement on 12/29/2023.  She does have some pain in that area with dressing change, but otherwise feels well.  Past Medical History:  Diagnosis Date   Arthritis    CAD (coronary artery disease) 7/12-8/12   staged LAD/RCA DES   CHF (congestive heart failure) (HCC)    COPD (chronic obstructive pulmonary disease) (HCC)    Diabetes mellitus (HCC)    Diastolic dysfunction 12/15/2012   grade 1    Dyslipidemia    Dysrhythmia    A. fib post CABG   Heart murmur    Mitral Valve repair November 2024   HTN (hypertension)    Pneumonia     Current Outpatient Medications  Medication Sig Dispense Refill   acetaminophen (TYLENOL) 325 MG tablet Take 2 tablets (650 mg total) by mouth every 6 (six) hours as needed for mild pain (pain score 1-3).     albuterol (VENTOLIN HFA) 108 (90 Base) MCG/ACT inhaler Inhale 1-2 puffs into the lungs every 6 (six) hours as needed for shortness of breath or wheezing.     apixaban (ELIQUIS) 5 MG TABS tablet Take 1 tablet (5 mg total) by mouth 2 (two) times daily. 60 tablet 5   aspirin EC 81 MG tablet Take 1 tablet (81 mg total) by mouth daily. Swallow whole.     BD INSULIN SYRINGE U/F 1/2UNIT 31G X 5/16" 0.3 ML MISC      Beta Carotene  (VITAMIN A) 25000 UNIT capsule Take 25,000 Units by mouth daily.     carvedilol (COREG) 3.125 MG tablet Take 1 tablet (3.125 mg total) by mouth 2 (two) times daily. 60 tablet 3   cyanocobalamin (VITAMIN B12) 1000 MCG tablet Take 2,000 mcg by mouth daily.     dapagliflozin propanediol (FARXIGA) 10 MG TABS tablet Take 1 tablet (10 mg total) by mouth daily. 30 tablet 5   ezetimibe (ZETIA) 10 MG tablet Take 1 tablet (10 mg total) by mouth daily. 90 tablet 2   furosemide (LASIX) 40 MG tablet Take 1 tablet (40 mg total) by mouth daily. 30 tablet 3   gabapentin (NEURONTIN) 100 MG capsule Take 100 mg by mouth 2 (two) times daily.     insulin NPH-regular Human (NOVOLIN 70/30) (70-30) 100 UNIT/ML injection Inject 30-60 Units into the skin 2 (two) times daily.     loratadine (CLARITIN) 10 MG tablet Take 10 mg by mouth daily.     ondansetron (ZOFRAN-ODT) 8 MG disintegrating tablet 8 mg every 8 (eight) hours as needed for nausea.     Oyster Shell (OYSTER CALCIUM) 500 MG TABS tablet Take 500 mg of elemental calcium by mouth 2 (two) times a week.     pantoprazole (  PROTONIX) 40 MG tablet Take 40 mg by mouth daily.     sacubitril-valsartan (ENTRESTO) 24-26 MG Take 1 tablet by mouth 2 (two) times daily. 60 tablet 6   spironolactone (ALDACTONE) 25 MG tablet Take 1 tablet (25 mg total) by mouth daily. 30 tablet 5   traMADol (ULTRAM) 50 MG tablet Take 1-2 tablets (50-100 mg total) by mouth every 6 (six) hours as needed for moderate pain (pain score 4-6) or severe pain (pain score 7-10). 30 tablet 0   No current facility-administered medications for this visit.    Physical Exam BP 135/74 (BP Location: Right Arm, Patient Position: Sitting, Cuff Size: Normal)   Pulse 99   Resp 20   Ht 5\' 4"  (1.626 m)   Wt 182 lb 3.2 oz (82.6 kg)   SpO2 97% Comment: RA  BMI 31.79 kg/m  78 year old woman in no acute distress Wound approximately 7 x 3 x 4 cm, granulating over 95%, 5% slough  Diagnostic Tests: OR cultures  negative  Impression: Diana [name] is a 78 year old woman with a history of CAD, severe MR, pulmonary hypertension, heart failure, hypertension, hyperlipidemia, type 2 diabetes, and obesity.  A coronary bypass grafting and mitral valve repair on 09/06/2023.  Presented back with dehiscence of the lower portion of her sternal wound.  Now has a VAC in place.  I am really pleased with the wound in general.  There is granulation tissue over about 95% of the surface area.  Small amount of slough superiorly.  Wound is already contracting.  Will continue twice weekly VAC dressing changes for now.  She does have home health coming to do those.  Plan: Return in 2 weeks to check on progress.  [name], MD [facility] [phone]

## 2024-01-10 ENCOUNTER — Other Ambulatory Visit (HOSPITAL_BASED_OUTPATIENT_CLINIC_OR_DEPARTMENT_OTHER): Payer: Self-pay

## 2024-01-10 ENCOUNTER — Encounter (HOSPITAL_COMMUNITY): Payer: Medicare HMO

## 2024-01-10 ENCOUNTER — Other Ambulatory Visit (HOSPITAL_COMMUNITY): Payer: Self-pay

## 2024-01-12 ENCOUNTER — Encounter (HOSPITAL_COMMUNITY): Payer: Medicare HMO

## 2024-01-15 ENCOUNTER — Encounter (HOSPITAL_COMMUNITY): Payer: Medicare HMO

## 2024-01-15 NOTE — Addendum Note (Signed)
 Encounter addended by: Artist Pais on: 01/15/2024 7:22 AM  Actions taken: Flowsheet data copied forward, Flowsheet accepted

## 2024-01-15 NOTE — Addendum Note (Signed)
 Encounter addended by: Artist Pais on: 01/15/2024 7:43 AM  Actions taken: Flowsheet data copied forward, Flowsheet accepted, Pend clinical note

## 2024-01-15 NOTE — Progress Notes (Incomplete)
 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  Hypoglycemia  Patient's Home Medications on Admission:  Current Outpatient Medications:    acetaminophen (TYLENOL) 325 MG tablet, Take 2 tablets (650 mg total) by mouth every 6 (six) hours as needed for mild pain (pain score 1-3)., Disp: , Rfl:    albuterol (VENTOLIN HFA) 108 (90 Base) MCG/ACT inhaler, Inhale 1-2 puffs into the lungs every 6 (six) hours as needed for shortness of breath or wheezing., Disp: , Rfl:    apixaban (ELIQUIS) 5 MG TABS tablet, Take 1 tablet (5 mg total) by mouth 2 (two) times daily., Disp: 60 tablet, Rfl: 5   aspirin EC 81 MG tablet, Take 1 tablet (81 mg total) by mouth daily. Swallow whole., Disp: , Rfl:    BD INSULIN SYRINGE U/F 1/2UNIT 31G X 5/16" 0.3 ML MISC, , Disp: , Rfl:    Beta Carotene (VITAMIN A) 25000 UNIT capsule, Take 25,000 Units by mouth daily., Disp: , Rfl:    carvedilol (COREG) 3.125 MG tablet, Take 1 tablet (3.125 mg total) by mouth 2 (two) times daily., Disp: 60 tablet, Rfl: 3   cyanocobalamin (VITAMIN B12) 1000 MCG tablet, Take 2,000 mcg by mouth daily., Disp: , Rfl:    dapagliflozin propanediol (FARXIGA) 10 MG TABS tablet, Take 1 tablet (10 mg total) by mouth daily., Disp: 30 tablet, Rfl: 5   ezetimibe (ZETIA) 10 MG tablet, Take 1 tablet (10 mg total) by mouth daily., Disp: 90 tablet, Rfl: 2   furosemide (LASIX) 40 MG tablet, Take 1 tablet (40 mg total) by mouth daily.,  Disp: 30 tablet, Rfl: 3   gabapentin (NEURONTIN) 100 MG capsule, Take 100 mg by mouth 2 (two) times daily., Disp: , Rfl:    insulin NPH-regular Human (NOVOLIN 70/30) (70-30) 100 UNIT/ML injection, Inject 30-60 Units into the skin 2 (two) times daily., Disp: , Rfl:    loratadine (CLARITIN) 10 MG tablet, Take 10 mg by mouth daily., Disp: , Rfl:    ondansetron (ZOFRAN-ODT) 8 MG disintegrating tablet, 8 mg every 8 (eight) hours as needed for nausea., Disp: , Rfl:    Oyster Shell (OYSTER CALCIUM) 500 MG TABS tablet, Take 500 mg of elemental calcium by mouth 2 (two) times a week., Disp: , Rfl:    OZEMPIC, 0.25 OR 0.5 MG/DOSE, 2 MG/3ML SOPN, Inject 0.25 mg into the skin once a week for 28 days, THEN 0.5 mg once a week for 28 days., Disp: 3 mL, Rfl: 5   pantoprazole (PROTONIX) 40 MG tablet, Take 40 mg by mouth daily., Disp: , Rfl:    sacubitril-valsartan (ENTRESTO) 24-26 MG, Take 1 tablet by mouth 2 (two) times daily., Disp: 60 tablet, Rfl: 6   spironolactone (ALDACTONE) 25 MG tablet, Take 1 tablet (25 mg total) by mouth daily., Disp: 30 tablet, Rfl: 5   traMADol (ULTRAM) 50 MG tablet, Take 1-2 tablets (50-100 mg total) by mouth every 6 (six) hours as needed for moderate pain (pain score 4-6)  or severe pain (pain score 7-10)., Disp: 30 tablet, Rfl: 0  Past Medical History: Past Medical History:  Diagnosis Date   Arthritis    CAD (coronary artery disease) 7/12-8/12   staged LAD/RCA DES   CHF (congestive heart failure) (HCC)    COPD (chronic obstructive pulmonary disease) (HCC)    Diabetes mellitus (HCC)    Diastolic dysfunction 12/15/2012   grade 1    Dyslipidemia    Dysrhythmia    A. fib post CABG   Heart murmur    Mitral Valve repair November 2024   HTN (hypertension)    Pneumonia     Tobacco Use: Social History   Tobacco Use  Smoking Status Never   Passive exposure: Never  Smokeless Tobacco Never    Labs: Review Flowsheet  More data exists      Latest Ref Rng & Units 09/18/2023  09/19/2023 09/20/2023 09/21/2023 12/28/2023  Labs for ITP Cardiac and Pulmonary Rehab  Hemoglobin A1c 4.8 - 5.6 % - - - - 6.7   O2 Saturation % 59.6  78.6  54.5  56.7  -    Capillary Blood Glucose: Lab Results  Component Value Date   GLUCAP 114 (H) 12/30/2023   GLUCAP 166 (H) 12/30/2023   GLUCAP 105 (H) 12/29/2023   GLUCAP 83 12/29/2023   GLUCAP 78 12/29/2023     Exercise Target Goals: Exercise Program Goal: Individual exercise prescription set using results from initial 6 min walk test and THRR while considering  patient's activity barriers and safety.   Exercise Prescription Goal: Initial exercise prescription builds to 30-45 minutes a day of aerobic activity, 2-3 days per week.  Home exercise guidelines will be given to patient during program as part of exercise prescription that the participant will acknowledge.  Activity Barriers & Risk Stratification:  Activity Barriers & Cardiac Risk Stratification - 11/16/23 1132       Activity Barriers & Cardiac Risk Stratification   Activity Barriers Arthritis;Deconditioning;Shortness of Breath;Decreased Ventricular Function;Balance Concerns;Assistive Device    Cardiac Risk Stratification High   <5 METs on            6 Minute Walk:  6 Minute Walk     Row Name 11/16/23 1131         6 Minute Walk   Phase Initial     Distance 720 feet     Walk Time 6 minutes     # of Rest Breaks 0     MPH 1.36     METS 1.38     RPE 9     Perceived Dyspnea  1     VO2 Peak 4.84     Symptoms Yes (comment)     Comments a little winded- resolved with rest. Used Rollator.     Resting HR 97 bpm     Resting BP 106/60     Resting Oxygen Saturation  94 %     Exercise Oxygen Saturation  during 6 min walk 94 %     Max Ex. HR 118 bpm     Max Ex. BP 118/70     2 Minute Post BP 110/68              Oxygen Initial Assessment:   Oxygen Re-Evaluation:   Oxygen Discharge (Final Oxygen Re-Evaluation):   Initial Exercise  Prescription:  Initial Exercise Prescription - 11/16/23 1100       Date of Initial Exercise RX and Referring Provider   Date 11/16/23    Referring  Provider Arvilla Meres, MD    Expected Discharge Date 02/10/24      NuStep   Level 1    SPM 60    Minutes 15    METs 1.6      Prescription Details   Frequency (times per week) 3    Duration Progress to 30 minutes of continuous aerobic without signs/symptoms of physical distress      Intensity   THRR 40-80% of Max Heartrate 58-118    Ratings of Perceived Exertion 11-13    Perceived Dyspnea 0-4      Progression   Progression Continue progressive overload as per policy without signs/symptoms or physical distress.      Resistance Training   Training Prescription Yes    Weight 2    Reps 10-15             Perform Capillary Blood Glucose checks as needed.  Exercise Prescription Changes:   Exercise Prescription Changes     Row Name 11/22/23 1028 12/08/23 1027 12/15/23 1033 12/27/23 1019       Response to Exercise   Blood Pressure (Admit) 110/58 104/56 124/74 112/62    Blood Pressure (Exercise) 108/60 106/54 102/58 --    Blood Pressure (Exit) 108/60 96/58 93/59  114/60    Heart Rate (Admit) 104 bpm 90 bpm 93 bpm 98 bpm    Heart Rate (Exercise) 107 bpm 104 bpm 106 bpm 109 bpm    Heart Rate (Exit) 100 bpm 93 bpm 94 bpm 93 bpm    Rating of Perceived Exertion (Exercise) 11 10 12 11     Symptoms None None None None    Comments Off to a good start with exercise. -- -- --    Duration Progress to 30 minutes of  aerobic without signs/symptoms of physical distress Progress to 30 minutes of  aerobic without signs/symptoms of physical distress Progress to 30 minutes of  aerobic without signs/symptoms of physical distress Progress to 30 minutes of  aerobic without signs/symptoms of physical distress    Intensity THRR unchanged THRR unchanged THRR unchanged THRR unchanged      Progression   Progression Continue to progress workloads  to maintain intensity without signs/symptoms of physical distress. Continue to progress workloads to maintain intensity without signs/symptoms of physical distress. Continue to progress workloads to maintain intensity without signs/symptoms of physical distress. Continue to progress workloads to maintain intensity without signs/symptoms of physical distress.    Average METs 1.6 2.2 1.8 1.8      Resistance Training   Training Prescription No Yes Yes No    Weight Relaxation day, no weights. 2 lbs 2 lbs Relaxation day, no weights    Reps -- 10-15 10-15 --    Time -- 10 Minutes 10 Minutes 10 Minutes      Interval Training   Interval Training No No No No      NuStep   Level 1 1 1 1     SPM 64 -- 106 96    Minutes 25 25 25 25     METs 1.6 2.2 1.8 1.8             Exercise Comments:   Exercise Comments     Row Name 11/22/23 1132           Exercise Comments Diana [name] tolerated low intensity exercise well without symptoms.                Exercise Goals and Review:   Exercise Goals     Row Name  11/16/23 1133             Exercise Goals   Increase Physical Activity Yes       Intervention Develop an individualized exercise prescription for aerobic and resistive training based on initial evaluation findings, risk stratification, comorbidities and participant's personal goals.;Provide advice, education, support and counseling about physical activity/exercise needs.       Expected Outcomes Short Term: Attend rehab on a regular basis to increase amount of physical activity.;Long Term: Exercising regularly at least 3-5 days a week.;Long Term: Add in home exercise to make exercise part of routine and to increase amount of physical activity.       Increase Strength and Stamina Yes       Intervention Provide advice, education, support and counseling about physical activity/exercise needs.;Develop an individualized exercise prescription for aerobic and resistive training based on initial  evaluation findings, risk stratification, comorbidities and participant's personal goals.       Expected Outcomes Short Term: Increase workloads from initial exercise prescription for resistance, speed, and METs.;Short Term: Perform resistance training exercises routinely during rehab and add in resistance training at home;Long Term: Improve cardiorespiratory fitness, muscular endurance and strength as measured by increased METs and functional capacity ( )       Able to understand and use rate of perceived exertion (RPE) scale Yes       Intervention Provide education and explanation on how to use RPE scale       Expected Outcomes Short Term: Able to use RPE daily in rehab to express subjective intensity level;Long Term:  Able to use RPE to guide intensity level when exercising independently       Knowledge and understanding of Target Heart Rate Range (THRR) Yes       Intervention Provide education and explanation of THRR including how the numbers were predicted and where they are located for reference       Expected Outcomes Short Term: Able to state/look up THRR;Short Term: Able to use daily as guideline for intensity in rehab;Long Term: Able to use THRR to govern intensity when exercising independently       Understanding of Exercise Prescription Yes       Intervention Provide education, explanation, and written materials on patient's individual exercise prescription       Expected Outcomes Short Term: Able to explain program exercise prescription;Long Term: Able to explain home exercise prescription to exercise independently                Exercise Goals Re-Evaluation :  Exercise Goals Re-Evaluation     Row Name 11/22/23 1132 12/19/23 0744 01/15/24 0724         Exercise Goal Re-Evaluation   Exercise Goals Review Increase Physical Activity;Increase Strength and Stamina;Able to understand and use rate of perceived exertion (RPE) scale Increase Physical Activity;Increase Strength and  Stamina;Able to understand and use rate of perceived exertion (RPE) scale Increase Physical Activity;Increase Strength and Stamina;Able to understand and use rate of perceived exertion (RPE) scale     Comments Patient able to understand and use RPE scale appropriately. Patient is out sick. Unable to review goals at this time. She is making gradual progress with exericse. Diana [name] had sternal wound drained and sternal wire removed on 12/29/23 and still has wound VAC in place. 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     Expected Outcomes Progress duration to achieve 30 minutes of exercise. Review goal  upon return to cardiac rehab. Review goal upon return to cardiac rehab.              Discharge Exercise Prescription (Final Exercise Prescription Changes):  Exercise Prescription Changes - 12/27/23 1019       Response to Exercise   Blood Pressure (Admit) 112/62    Blood Pressure (Exit) 114/60    Heart Rate (Admit) 98 bpm    Heart Rate (Exercise) 109 bpm    Heart Rate (Exit) 93 bpm    Rating of Perceived Exertion (Exercise) 11    Symptoms None    Duration Progress to 30 minutes of  aerobic without signs/symptoms of physical distress    Intensity THRR unchanged      Progression   Progression Continue to progress workloads to maintain intensity without signs/symptoms of physical distress.    Average METs 1.8      Resistance Training   Training Prescription No    Weight Relaxation day, no weights    Time 10 Minutes      Interval Training   Interval Training No      NuStep   Level 1    SPM 96    Minutes 25    METs 1.8             Nutrition:  Target Goals: Understanding of nutrition guidelines, daily intake of sodium 1500mg , cholesterol 200mg , calories 30% from fat and 7% or less from saturated fats, daily to have 5 or more servings of fruits and vegetables.  Biometrics:  Pre Biometrics - 11/16/23 1130        Pre Biometrics   Waist Circumference 46 inches    Hip Circumference 46 inches    Waist to Hip Ratio 1 %    Triceps Skinfold 37 mm    % Body Fat 47.3 %    Grip Strength 20 kg    Flexibility 0 in   not done, low back pain   Single Leg Stand 1.87 seconds              Nutrition Therapy Plan and Nutrition Goals:  Nutrition Therapy & Goals - 12/18/23 1022       Nutrition Therapy   Diet Heart Healthy/Carbohydrate Consistent Diet    Drug/Food Interactions --      Personal Nutrition Goals   Nutrition Goal Patient to identify strategies for reducing cardiovascular risk by attending the [facility] education and nutrition series weekly.   goal in progress.   Personal Goal #2 Patient to improve diet quality by using the plate method as a guide for meal planning to include lean protein/plant protein, fruits, vegetables, whole grains, nonfat dairy as part of a well-balanced diet.   goal in progress.   Comments Goals in progress. Diana [name] has medical history of DM2, hyperlipidemia, CABGx4, chronic systolic heart failure, HTN, s/p mitral valve repair. She continues to attend the [facility] education/nutrition series regularly. A1c and LDL are at goal. She has maintained her weight since starting with our program. She continues regular follow-up with endocrinology and advanced heart failure clinic. Patient will benefit from participation in intensive cardiac rehab for nutrition, exercise, and lifestyle modification.      Intervention Plan   Intervention Prescribe, educate and counsel regarding individualized specific dietary modifications aiming towards targeted core components such as weight, hypertension, lipid management, diabetes, heart failure and other comorbidities.;Nutrition handout(s) given to patient.    Expected Outcomes Short Term Goal: Understand basic principles of dietary content, such as calories, fat, sodium, cholesterol  and nutrients.;Long Term Goal: Adherence to prescribed nutrition plan.              Nutrition Assessments:  MEDIFICTS Score Key: >=70 Need to make dietary changes  40-70 Heart Healthy Diet <= 40 Therapeutic Level Cholesterol Diet    Picture Your Plate Scores: <04 Unhealthy dietary pattern with much room for improvement. 41-50 Dietary pattern unlikely to meet recommendations for good health and room for improvement. 51-60 More healthful dietary pattern, with some room for improvement.  >60 Healthy dietary pattern, although there may be some specific behaviors that could be improved.    Nutrition Goals Re-Evaluation:  Nutrition Goals Re-Evaluation     Row Name 12/18/23 1022             Goals   Current Weight 186 lb 4.6 oz (84.5 kg)       Comment Cr 1.11, GFR 52, A1c 6.9, LDL 71, HDL 37, Lpa WNL       Expected Outcome Goals in progress. Diana [name] has medical history of DM2, hyperlipidemia, CABGx4, chronic systolic heart failure, HTN, s/p mitral valve repair. She continues to attend the [facility] education/nutrition series regularly. A1c and LDL are at goal. She has maintained her weight since starting with our program. She continues regular follow-up with endocrinology and advanced heart failure clinic. Patient will benefit from participation in intensive cardiac rehab for nutrition, exercise, and lifestyle modification.                Nutrition Goals Re-Evaluation:  Nutrition Goals Re-Evaluation     Row Name 12/18/23 1022             Goals   Current Weight 186 lb 4.6 oz (84.5 kg)       Comment Cr 1.11, GFR 52, A1c 6.9, LDL 71, HDL 37, Lpa WNL       Expected Outcome Goals in progress. Diana [name] has medical history of DM2, hyperlipidemia, CABGx4, chronic systolic heart failure, HTN, s/p mitral valve repair. She continues to attend the [facility] education/nutrition series regularly. A1c and LDL are at goal. She has maintained her weight since starting with our program. She continues regular follow-up with endocrinology and advanced heart failure  clinic. Patient will benefit from participation in intensive cardiac rehab for nutrition, exercise, and lifestyle modification.                Nutrition Goals Discharge (Final Nutrition Goals Re-Evaluation):  Nutrition Goals Re-Evaluation - 12/18/23 1022       Goals   Current Weight 186 lb 4.6 oz (84.5 kg)    Comment Cr 1.11, GFR 52, A1c 6.9, LDL 71, HDL 37, Lpa WNL    Expected Outcome Goals in progress. Diana [name] has medical history of DM2, hyperlipidemia, CABGx4, chronic systolic heart failure, HTN, s/p mitral valve repair. She continues to attend the [facility] education/nutrition series regularly. A1c and LDL are at goal. She has maintained her weight since starting with our program. She continues regular follow-up with endocrinology and advanced heart failure clinic. Patient will benefit from participation in intensive cardiac rehab for nutrition, exercise, and lifestyle modification.             Psychosocial: Target Goals: Acknowledge presence or absence of significant depression and/or stress, maximize coping skills, provide positive support system. Participant is able to verbalize types and ability to use techniques and skills needed for reducing stress and depression.  Initial Review & Psychosocial Screening:  Initial Psych Review & Screening - 11/16/23 1134  Initial Review   Current issues with None Identified      Family Dynamics   Good Support System? Yes   Diana [name] has her husband and children for support   Comments Diana [name] denies any depression/ anxiety/ stress. She is still very fatigued with ADL's and shared that she is frustrated with how slow progress has been. She is ready to return to her baseline.      Barriers   Psychosocial barriers to participate in program There are no identifiable barriers or psychosocial needs.      Screening Interventions   Interventions Encouraged to exercise;Provide feedback about the scores to participant    Expected Outcomes  Short Term goal: Identification and review with participant of any Quality of Life or Depression concerns found by scoring the questionnaire.;Long Term goal: The participant improves quality of Life and PHQ9 Scores as seen by post scores and/or verbalization of changes             Quality of Life Scores:  Quality of Life - 11/16/23 1137       Quality of Life   Select Quality of Life      Quality of Life Scores   Health/Function Pre 19 %    Socioeconomic Pre 29 %    Psych/Spiritual Pre 28.29 %    Family Pre 28.8 %    GLOBAL Pre 24.8 %            Scores of 19 and below usually indicate a poorer quality of life in these areas.  A difference of  2-3 points is a clinically meaningful difference.  A difference of 2-3 points in the total score of the Quality of Life Index has been associated with significant improvement in overall quality of life, self-image, physical symptoms, and general health in studies assessing change in quality of life.  PHQ-9: Review Flowsheet       11/16/2023 03/24/2016  Depression screen PHQ 2/9  Decreased Interest 0 0  Down, Depressed, Hopeless 0 0  PHQ - 2 Score 0 0  Altered sleeping 0 -  Tired, decreased energy 0 -  Change in appetite 0 -  Feeling bad or failure about yourself  0 -  Trouble concentrating 0 -  Moving slowly or fidgety/restless 0 -  Suicidal thoughts 0 -  PHQ-9 Score 0 -  Difficult doing work/chores Not difficult at all -   Interpretation of Total Score  Total Score Depression Severity:  1-4 = Minimal depression, 5-9 = Mild depression, 10-14 = Moderate depression, 15-19 = Moderately severe depression, 20-27 = Severe depression   Psychosocial Evaluation and Intervention:   Psychosocial Re-Evaluation:  Psychosocial Re-Evaluation     Row Name 11/22/23 1132 12/18/23 1707           Psychosocial Re-Evaluation   Current issues with None Identified None Identified      Comments Diana [name] did not voice any increased concerns or  stressors on her first day of exercise. Diana [name] has returned to work . Diana [name] has not voiced any increased concerns or stressors during exercise.      Continue Psychosocial Services  Follow up required by staff Follow up required by staff               Psychosocial Discharge (Final Psychosocial Re-Evaluation):  Psychosocial Re-Evaluation - 12/18/23 1707       Psychosocial Re-Evaluation   Current issues with None Identified    Comments Diana [name] has returned to work . Diana [name] has not voiced any increased  concerns or stressors during exercise.    Continue Psychosocial Services  Follow up required by staff             Vocational Rehabilitation: Provide vocational rehab assistance to qualifying candidates.   Vocational Rehab Evaluation & Intervention:  Vocational Rehab - 11/16/23 1138       Initial Vocational Rehab Evaluation & Intervention   Assessment shows need for Vocational Rehabilitation No   Diana [name] is working            Education: Education Goals: Education classes will be provided on a weekly basis, covering required topics. Participant will state understanding/return demonstration of topics presented.    Education     Row Name 11/24/23 1100     Education   Cardiac Education Topics [facility]   Hospital doctor Education   General Education Heart Disease Risk Reduction   Instruction Review Code 1- Verbalizes Understanding   Class Start Time 1145   Class Stop Time 1230   Class Time Calculation (min) 45 min    Row Name 11/27/23 1100     Education   Cardiac Education Topics [facility]   [employer] Workshops     Workshops   Educator Exercise Physiologist   [facility] Exercise   Exercise Workshop Location manager and Fall Prevention   Instruction Review Code 1- Verbalizes Understanding   Class Start Time 1142   Class Stop Time 1224   Class Time Calculation (min) 42 min    Row Name 11/29/23  1000     Education   Cardiac Education Topics [facility]   Customer service manager   Weekly Topic Adding Flavor - Sodium-Free   Instruction Review Code 1- Verbalizes Understanding   Class Start Time 1145   Class Stop Time 1225   Class Time Calculation (min) 40 min    Row Name 12/08/23 1300     Education   Cardiac Education Topics [facility]   Licensed conveyancer Nutrition   Nutrition Other  Label Reading   Instruction Review Code 1- Verbalizes Understanding   Class Start Time 1152   Class Stop Time 1233   Class Time Calculation (min) 41 min    Row Name 12/11/23 1100     Education   Cardiac Education Topics [facility]   Actor Nurse   Select Nutrition   Nutrition Becoming a [employer] Chef   Instruction Review Code 1- Verbalizes Understanding   Class Start Time 1150   Class Stop Time 1226   Class Time Calculation (min) 36 min    Row Name 12/13/23 1300     Education   Cardiac Education Topics [facility]   Secondary school teacher School   Educator Dietitian;Nurse   Weekly Topic Personalizing Your [facility] Plate   Instruction Review Code 1- Verbalizes Understanding   Class Start Time 1149   Class Stop Time 1233   Class Time Calculation (min) 44 min    Row Name 12/15/23 1400     Education   Cardiac Education Topics Pritikin   Glass blower/designer Nutrition   Nutrition Workshop Label Reading   Instruction Review Code 1- Education officer, environmental  Time 1145   Class Stop Time 1230   Class Time Calculation (min) 45 min    Row Name 12/27/23 1600     Education   Cardiac Education Topics Pritikin   Customer service manager   Weekly Topic Efficiency Cooking - Meals in a Snap   Instruction Review Code 1- Verbalizes Understanding   Class  Start Time 1145   Class Stop Time 1225   Class Time Calculation (min) 40 min            Core Videos: Exercise    Move It!  Clinical staff conducted group or individual video education with verbal and written material and guidebook.  Patient learns the recommended Pritikin exercise program. Exercise with the goal of living a long, healthy life. Some of the health benefits of exercise include controlled diabetes, healthier blood pressure levels, improved cholesterol levels, improved heart and lung capacity, improved sleep, and better body composition. Everyone should speak with their doctor before starting or changing an exercise routine.  Biomechanical Limitations Clinical staff conducted group or individual video education with verbal and written material and guidebook.  Patient learns how biomechanical limitations can impact exercise and how we can mitigate and possibly overcome limitations to have an impactful and balanced exercise routine.  Body Composition Clinical staff conducted group or individual video education with verbal and written material and guidebook.  Patient learns that body composition (ratio of muscle mass to fat mass) is a key component to assessing overall fitness, rather than body weight alone. Increased fat mass, especially visceral belly fat, can put [place] at increased risk for metabolic syndrome, type 2 diabetes, heart disease, and even death. It is recommended to combine diet and exercise (cardiovascular and resistance training) to improve your body composition. Seek guidance from your physician and exercise physiologist before implementing an exercise routine.  Exercise Action Plan Clinical staff conducted group or individual video education with verbal and written material and guidebook.  Patient learns the recommended strategies to achieve and enjoy long-term exercise adherence, including variety, self-motivation, self-efficacy, and positive decision making.  Benefits of exercise include fitness, good health, weight management, more energy, better sleep, less stress, and overall well-being.  Medical   Heart Disease Risk Reduction Clinical staff conducted group or individual video education with verbal and written material and guidebook.  Patient learns our heart is our most vital organ as it circulates oxygen, nutrients, white blood cells, and hormones throughout the entire body, and carries waste away. Data supports a plant-based eating plan like the Pritikin Program for its effectiveness in slowing progression of and reversing heart disease. The video provides a number of recommendations to address heart disease.   Metabolic Syndrome and Belly Fat  Clinical staff conducted group or individual video education with verbal and written material and guidebook.  Patient learns what metabolic syndrome is, how it leads to heart disease, and how one can reverse it and keep it from coming back. You have metabolic syndrome if you have 3 of the following 5 criteria: abdominal obesity, high blood pressure, high triglycerides, low HDL cholesterol, and high blood sugar.  Hypertension and Heart Disease Clinical staff conducted group or individual video education with verbal and written material and guidebook.  Patient learns that high blood pressure, or hypertension, is very common in the [place]. Hypertension is largely due to excessive salt intake, but other important risk factors include being overweight, physical inactivity, drinking too much alcohol,  smoking, and not eating enough potassium from fruits and vegetables. High blood pressure is a leading risk factor for heart attack, stroke, congestive heart failure, dementia, kidney failure, and premature death. Long-term effects of excessive salt intake include stiffening of the arteries and thickening of heart muscle and organ damage. Recommendations include ways to reduce hypertension and the risk of heart  disease.  Diseases of Our Time - Focusing on Diabetes Clinical staff conducted group or individual video education with verbal and written material and guidebook.  Patient learns why the best way to stop diseases of our time is prevention, through food and other lifestyle changes. Medicine (such as prescription pills and surgeries) is often only a Band-Aid on the problem, not a long-term solution. Most common diseases of our time include obesity, type 2 diabetes, hypertension, heart disease, and cancer. The Pritikin Program is recommended and has been proven to help reduce, reverse, and/or prevent the damaging effects of metabolic syndrome.  Nutrition   Overview of the Pritikin Eating Plan  Clinical staff conducted group or individual video education with verbal and written material and guidebook.  Patient learns about the Pritikin Eating Plan for disease risk reduction. The Pritikin Eating Plan emphasizes a wide variety of unrefined, minimally-processed carbohydrates, like fruits, vegetables, whole grains, and legumes. Go, Caution, and Stop food choices are explained. Plant-based and lean animal proteins are emphasized. Rationale provided for low sodium intake for blood pressure control, low added sugars for blood sugar stabilization, and low added fats and oils for coronary artery disease risk reduction and weight management.  Calorie Density  Clinical staff conducted group or individual video education with verbal and written material and guidebook.  Patient learns about calorie density and how it impacts the Pritikin Eating Plan. Knowing the characteristics of the food you choose will help you decide whether those foods will lead to weight gain or weight loss, and whether you want to consume more or less of them. Weight loss is usually a side effect of the Pritikin Eating Plan because of its focus on low calorie-dense foods.  Label Reading  Clinical staff conducted group or individual video  education with verbal and written material and guidebook.  Patient learns about the Pritikin recommended label reading guidelines and corresponding recommendations regarding calorie density, added sugars, sodium content, and whole grains.  Dining Out - Part 1  Clinical staff conducted group or individual video education with verbal and written material and guidebook.  Patient learns that restaurant meals can be sabotaging because they can be so high in calories, fat, sodium, and/or sugar. Patient learns recommended strategies on how to positively address this and avoid unhealthy pitfalls.  Facts on Fats  Clinical staff conducted group or individual video education with verbal and written material and guidebook.  Patient learns that lifestyle modifications can be just as effective, if not more so, as many medications for lowering your risk of heart disease. A Pritikin lifestyle can help to reduce your risk of inflammation and atherosclerosis (cholesterol build-up, or plaque, in the artery walls). Lifestyle interventions such as dietary choices and physical activity address the cause of atherosclerosis. A review of the types of fats and their impact on blood cholesterol levels, along with dietary recommendations to reduce fat intake is also included.  Nutrition Action Plan  Clinical staff conducted group or individual video education with verbal and written material and guidebook.  Patient learns how to incorporate Pritikin recommendations into their lifestyle. Recommendations include planning and keeping personal health goals in  mind as an important part of their success.  Healthy Mind-Set    Healthy Minds, Bodies, Hearts  Clinical staff conducted group or individual video education with verbal and written material and guidebook.  Patient learns how to identify when they are stressed. Video will discuss the impact of that stress, as well as the many benefits of stress management. Patient will also  be introduced to stress management techniques. The way we think, act, and feel has an impact on our hearts.  How Our Thoughts Can Heal Our Hearts  Clinical staff conducted group or individual video education with verbal and written material and guidebook.  Patient learns that negative thoughts can cause depression and anxiety. This can result in negative lifestyle behavior and serious health problems. Cognitive behavioral therapy is an effective method to help control our thoughts in order to change and improve our emotional outlook.  Additional Videos:  Exercise    Improving Performance  Clinical staff conducted group or individual video education with verbal and written material and guidebook.  Patient learns to use a non-linear approach by alternating intensity levels and lengths of time spent exercising to help burn more calories and lose more body fat. Cardiovascular exercise helps improve heart health, metabolism, hormonal balance, blood sugar control, and recovery from fatigue. Resistance training improves strength, endurance, balance, coordination, reaction time, metabolism, and muscle mass. Flexibility exercise improves circulation, posture, and balance. Seek guidance from your physician and exercise physiologist before implementing an exercise routine and learn your capabilities and proper form for all exercise.  Introduction to Yoga  Clinical staff conducted group or individual video education with verbal and written material and guidebook.  Patient learns about yoga, a discipline of the coming together of mind, breath, and body. The benefits of yoga include improved flexibility, improved range of motion, better posture and core strength, increased lung function, weight loss, and positive self-image. Yoga's heart health benefits include lowered blood pressure, healthier heart rate, decreased cholesterol and triglyceride levels, improved immune function, and reduced stress. Seek guidance  from your physician and exercise physiologist before implementing an exercise routine and learn your capabilities and proper form for all exercise.  Medical   Aging: Enhancing Your Quality of Life  Clinical staff conducted group or individual video education with verbal and written material and guidebook.  Patient learns key strategies and recommendations to stay in good physical health and enhance quality of life, such as prevention strategies, having an advocate, securing a Health Care Proxy and Power of Attorney, and keeping a list of medications and system for tracking them. It also discusses how to avoid risk for bone loss.  Biology of Weight Control  Clinical staff conducted group or individual video education with verbal and written material and guidebook.  Patient learns that weight gain occurs because we consume more calories than we burn (eating more, moving less). Even if your body weight is normal, you may have higher ratios of fat compared to muscle mass. Too much body fat puts you at increased risk for cardiovascular disease, heart attack, stroke, type 2 diabetes, and obesity-related cancers. In addition to exercise, following the Pritikin Eating Plan can help reduce your risk.  Decoding Lab Results  Clinical staff conducted group or individual video education with verbal and written material and guidebook.  Patient learns that lab test reflects one measurement whose values change over time and are influenced by many factors, including medication, stress, sleep, exercise, food, hydration, pre-existing medical conditions, and more. It is recommended to  use the knowledge from this video to become more involved with your lab results and evaluate your numbers to speak with your doctor.   Diseases of Our Time - Overview  Clinical staff conducted group or individual video education with verbal and written material and guidebook.  Patient learns that according to the CDC, 50% to 70% of  chronic diseases (such as obesity, type 2 diabetes, elevated lipids, hypertension, and heart disease) are avoidable through lifestyle improvements including healthier food choices, listening to satiety cues, and increased physical activity.  Sleep Disorders Clinical staff conducted group or individual video education with verbal and written material and guidebook.  Patient learns how good quality and duration of sleep are important to overall health and well-being. Patient also learns about sleep disorders and how they impact health along with recommendations to address them, including discussing with a physician.  Nutrition  Dining Out - Part 2 Clinical staff conducted group or individual video education with verbal and written material and guidebook.  Patient learns how to plan ahead and communicate in order to maximize their dining experience in a healthy and nutritious manner. Included are recommended food choices based on the type of restaurant the patient is visiting.   Fueling a Banker conducted group or individual video education with verbal and written material and guidebook.  There is a strong connection between our food choices and our health. Diseases like obesity and type 2 diabetes are very prevalent and are in large-part due to lifestyle choices. The Pritikin Eating Plan provides plenty of food and hunger-curbing satisfaction. It is easy to follow, affordable, and helps reduce health risks.  Menu Workshop  Clinical staff conducted group or individual video education with verbal and written material and guidebook.  Patient learns that restaurant meals can sabotage health goals because they are often packed with calories, fat, sodium, and sugar. Recommendations include strategies to plan ahead and to communicate with the manager, chef, or server to help order a healthier meal.  Planning Your Eating Strategy  Clinical staff conducted group or individual video  education with verbal and written material and guidebook.  Patient learns about the Pritikin Eating Plan and its benefit of reducing the risk of disease. The Pritikin Eating Plan does not focus on calories. Instead, it emphasizes high-quality, nutrient-rich foods. By knowing the characteristics of the foods, we choose, we can determine their calorie density and make informed decisions.  Targeting Your Nutrition Priorities  Clinical staff conducted group or individual video education with verbal and written material and guidebook.  Patient learns that lifestyle habits have a tremendous impact on disease risk and progression. This video provides eating and physical activity recommendations based on your personal health goals, such as reducing LDL cholesterol, losing weight, preventing or controlling type 2 diabetes, and reducing high blood pressure.  Vitamins and Minerals  Clinical staff conducted group or individual video education with verbal and written material and guidebook.  Patient learns different ways to obtain key vitamins and minerals, including through a recommended healthy diet. It is important to discuss all supplements you take with your doctor.   Healthy Mind-Set    Smoking Cessation  Clinical staff conducted group or individual video education with verbal and written material and guidebook.  Patient learns that cigarette smoking and tobacco addiction pose a serious health risk which affects millions of people. Stopping smoking will significantly reduce the risk of heart disease, lung disease, and many forms of cancer. Recommended strategies for quitting are  covered, including working with your doctor to develop a successful plan.  Culinary   Becoming a Set designer conducted group or individual video education with verbal and written material and guidebook.  Patient learns that cooking at home can be healthy, cost-effective, quick, and puts them in control. Keys to  cooking healthy recipes will include looking at your recipe, assessing your equipment needs, planning ahead, making it simple, choosing cost-effective seasonal ingredients, and limiting the use of added fats, salts, and sugars.  Cooking - Breakfast and Snacks  Clinical staff conducted group or individual video education with verbal and written material and guidebook.  Patient learns how important breakfast is to satiety and nutrition through the entire day. Recommendations include key foods to eat during breakfast to help stabilize blood sugar levels and to prevent overeating at meals later in the day. Planning ahead is also a key component.  Cooking - Educational psychologist conducted group or individual video education with verbal and written material and guidebook.  Patient learns eating strategies to improve overall health, including an approach to cook more at home. Recommendations include thinking of animal protein as a side on your plate rather than center stage and focusing instead on lower calorie dense options like vegetables, fruits, whole grains, and plant-based proteins, such as beans. Making sauces in large quantities to freeze for later and leaving the skin on your vegetables are also recommended to maximize your experience.  Cooking - Healthy Salads and Dressing Clinical staff conducted group or individual video education with verbal and written material and guidebook.  Patient learns that vegetables, fruits, whole grains, and legumes are the foundations of the Pritikin Eating Plan. Recommendations include how to incorporate each of these in flavorful and healthy salads, and how to create homemade salad dressings. Proper handling of ingredients is also covered. Cooking - Soups and [employer] - Soups and Desserts Clinical staff conducted group or individual video education with verbal and written material and guidebook.  Patient learns that Pritikin soups and desserts  make for easy, nutritious, and delicious snacks and meal components that are low in sodium, fat, sugar, and calorie density, while high in vitamins, minerals, and filling fiber. Recommendations include simple and healthy ideas for soups and desserts.   Overview     The Pritikin Solution Program Overview Clinical staff conducted group or individual video education with verbal and written material and guidebook.  Patient learns that the results of the Pritikin Program have been documented in more than 100 articles published in peer-reviewed journals, and the benefits include reducing risk factors for (and, in some cases, even reversing) high cholesterol, high blood pressure, type 2 diabetes, obesity, and more! An overview of the three key pillars of the Pritikin Program will be covered: eating well, doing regular exercise, and having a healthy mind-set.  WORKSHOPS  Exercise: Exercise Basics: Building Your Action Plan Clinical staff led group instruction and group discussion with PowerPoint presentation and patient guidebook. To enhance the learning environment the use of posters, models and videos may be added. At the conclusion of this workshop, patients will comprehend the difference between physical activity and exercise, as well as the benefits of incorporating both, into their routine. Patients will understand the FITT (Frequency, Intensity, Time, and Type) principle and how to use it to build an exercise action plan. In addition, safety concerns and other considerations for exercise and cardiac rehab will be addressed by the presenter. The purpose of this  lesson is to promote a comprehensive and effective weekly exercise routine in order to improve patients' overall level of fitness.   Managing Heart Disease: Your Path to a Healthier Heart Clinical staff led group instruction and group discussion with PowerPoint presentation and patient guidebook. To enhance the learning environment the use  of posters, models and videos may be added.At the conclusion of this workshop, patients will understand the anatomy and physiology of the heart. Additionally, they will understand how Pritikin's three pillars impact the risk factors, the progression, and the management of heart disease.  The purpose of this lesson is to provide a high-level overview of the heart, heart disease, and how the Pritikin lifestyle positively impacts risk factors.  Exercise Biomechanics Clinical staff led group instruction and group discussion with PowerPoint presentation and patient guidebook. To enhance the learning environment the use of posters, models and videos may be added. Patients will learn how the structural parts of their bodies function and how these functions impact their daily activities, movement, and exercise. Patients will learn how to promote a neutral spine, learn how to manage pain, and identify ways to improve their physical movement in order to promote healthy living. The purpose of this lesson is to expose patients to common physical limitations that impact physical activity. Participants will learn practical ways to adapt and manage aches and pains, and to minimize their effect on regular exercise. Patients will learn how to maintain good posture while sitting, walking, and lifting.  Balance Training and Fall Prevention  Clinical staff led group instruction and group discussion with PowerPoint presentation and patient guidebook. To enhance the learning environment the use of posters, models and videos may be added. At the conclusion of this workshop, patients will understand the importance of their sensorimotor skills (vision, proprioception, and the vestibular system) in maintaining their ability to balance as they age. Patients will apply a variety of balancing exercises that are appropriate for their current level of function. Patients will understand the common causes for poor balance,  possible solutions to these problems, and ways to modify their physical environment in order to minimize their fall risk. The purpose of this lesson is to teach patients about the importance of maintaining balance as they age and ways to minimize their risk of falling.  WORKSHOPS   Nutrition:  Fueling a Ship broker led group instruction and group discussion with PowerPoint presentation and patient guidebook. To enhance the learning environment the use of posters, models and videos may be added. Patients will review the foundational principles of the Pritikin Eating Plan and understand what constitutes a serving size in each of the food groups. Patients will also learn Pritikin-friendly foods that are better choices when away from home and review make-ahead meal and snack options. Calorie density will be reviewed and applied to three nutrition priorities: weight maintenance, weight loss, and weight gain. The purpose of this lesson is to reinforce (in a group setting) the key concepts around what patients are recommended to eat and how to apply these guidelines when away from home by planning and selecting Pritikin-friendly options. Patients will understand how calorie density may be adjusted for different weight management goals.  Mindful Eating  Clinical staff led group instruction and group discussion with PowerPoint presentation and patient guidebook. To enhance the learning environment the use of posters, models and videos may be added. Patients will briefly review the concepts of the Pritikin Eating Plan and the importance of low-calorie dense foods. The concept of  mindful eating will be introduced as well as the importance of paying attention to internal hunger signals. Triggers for non-hunger eating and techniques for dealing with triggers will be explored. The purpose of this lesson is to provide patients with the opportunity to review the basic principles of the Pritikin Eating  Plan, discuss the value of eating mindfully and how to measure internal cues of hunger and fullness using the Hunger Scale. Patients will also discuss reasons for non-hunger eating and learn strategies to use for controlling emotional eating.  Targeting Your Nutrition Priorities Clinical staff led group instruction and group discussion with PowerPoint presentation and patient guidebook. To enhance the learning environment the use of posters, models and videos may be added. Patients will learn how to determine their genetic susceptibility to disease by reviewing their family history. Patients will gain insight into the importance of diet as part of an overall healthy lifestyle in mitigating the impact of genetics and other environmental insults. The purpose of this lesson is to provide patients with the opportunity to assess their personal nutrition priorities by looking at their family history, their own health history and current risk factors. Patients will also be able to discuss ways of prioritizing and modifying the Pritikin Eating Plan for their highest risk areas  Menu  Clinical staff led group instruction and group discussion with PowerPoint presentation and patient guidebook. To enhance the learning environment the use of posters, models and videos may be added. Using menus brought in from [employer], or printed from [employer], patients will apply the Pritikin dining out guidelines that were presented in the [employer] video. Patients will also be able to practice these guidelines in a variety of provided scenarios. The purpose of this lesson is to provide patients with the opportunity to practice hands-on learning of the Pritikin Dining Out guidelines with actual menus and practice scenarios.  Label Reading Clinical staff led group instruction and group discussion with PowerPoint presentation and patient guidebook. To enhance the learning environment the use of  posters, models and videos may be added. Patients will review and discuss the Pritikin label reading guidelines presented in [employer]'s Label Reading Educational series video. Using fool labels brought in from local grocery stores and markets, patients will apply the label reading guidelines and determine if the packaged food meet the Pritikin guidelines. The purpose of this lesson is to provide patients with the opportunity to review, discuss, and practice hands-on learning of the Pritikin Label Reading guidelines with actual packaged food labels. Cooking School  [employer]'s [employer] are designed to teach patients ways to prepare quick, simple, and affordable recipes at home. The importance of nutrition's role in chronic disease risk reduction is reflected in its emphasis in the overall Pritikin program. By learning how to prepare essential core Pritikin Eating Plan recipes, patients will increase control over what they eat; be able to customize the flavor of foods without the use of added salt, sugar, or fat; and improve the quality of the food they consume. By learning a set of core recipes which are easily assembled, quickly prepared, and affordable, patients are more likely to prepare more healthy foods at home. These workshops focus on convenient breakfasts, simple entres, side dishes, and desserts which can be prepared with minimal effort and are consistent with nutrition recommendations for cardiovascular risk reduction. Cooking [employer] are taught by a Armed forces logistics/support/administrative officer (RD) who has been trained by the [employer]. The chef or  RD has a clear understanding of the importance of minimizing - if not completely eliminating - added fat, sugar, and sodium in recipes. Throughout the series of Cooking School Workshop sessions, patients will learn about healthy ingredients and efficient methods of cooking to build confidence in their capability to  prepare    Cooking School weekly topics:  Adding Flavor- Sodium-Free  Fast and Healthy Breakfasts  Powerhouse Plant-Based Proteins  Satisfying Salads and Dressings  Simple Sides and Sauces  International Cuisine-Spotlight on the [employer] Zones  Delicious Desserts  Savory Soups  [employer] - Meals in a Astronomer Appetizers and Snacks  Comforting Weekend Breakfasts  One-Pot Wonders   Fast Evening Meals  Landscape architect Your Pritikin Plate  WORKSHOPS   Healthy Mindset (Psychosocial):  Focused Goals, Sustainable Changes Clinical staff led group instruction and group discussion with PowerPoint presentation and patient guidebook. To enhance the learning environment the use of posters, models and videos may be added. Patients will be able to apply effective goal setting strategies to establish at least one personal goal, and then take consistent, meaningful action toward that goal. They will learn to identify common barriers to achieving personal goals and develop strategies to overcome them. Patients will also gain an understanding of how our mind-set can impact our ability to achieve goals and the importance of cultivating a positive and growth-oriented mind-set. The purpose of this lesson is to provide patients with a deeper understanding of how to set and achieve personal goals, as well as the tools and strategies needed to overcome common obstacles which may arise along the way.  From Head to Heart: The Power of a Healthy Outlook  Clinical staff led group instruction and group discussion with PowerPoint presentation and patient guidebook. To enhance the learning environment the use of posters, models and videos may be added. Patients will be able to recognize and describe the impact of emotions and mood on physical health. They will discover the importance of self-care and explore self-care practices which may work for them. Patients will also learn how to utilize  the 4 C's to cultivate a healthier outlook and better manage stress and challenges. The purpose of this lesson is to demonstrate to patients how a healthy outlook is an essential part of maintaining good health, especially as they continue their cardiac rehab journey.  Healthy Sleep for a Healthy Heart Clinical staff led group instruction and group discussion with PowerPoint presentation and patient guidebook. To enhance the learning environment the use of posters, models and videos may be added. At the conclusion of this workshop, patients will be able to demonstrate knowledge of the importance of sleep to overall health, well-being, and quality of life. They will understand the symptoms of, and treatments for, common sleep disorders. Patients will also be able to identify daytime and nighttime behaviors which impact sleep, and they will be able to apply these tools to help manage sleep-related challenges. The purpose of this lesson is to provide patients with a general overview of sleep and outline the importance of quality sleep. Patients will learn about a few of the most common sleep disorders. Patients will also be introduced to the concept of "sleep hygiene," and discover ways to self-manage certain sleeping problems through simple daily behavior changes. Finally, the workshop will motivate patients by clarifying the links between quality sleep and their goals of heart-healthy living.   Recognizing and Reducing Stress Clinical staff led group instruction and group discussion with PowerPoint presentation and  patient guidebook. To enhance the learning environment the use of posters, models and videos may be added. At the conclusion of this workshop, patients will be able to understand the types of stress reactions, differentiate between acute and chronic stress, and recognize the impact that chronic stress has on their health. They will also be able to apply different coping mechanisms, such as reframing  negative self-talk. Patients will have the opportunity to practice a variety of stress management techniques, such as deep abdominal breathing, progressive muscle relaxation, and/or guided imagery.  The purpose of this lesson is to educate patients on the role of stress in their lives and to provide healthy techniques for coping with it.  Learning Barriers/Preferences:  Learning Barriers/Preferences - 11/16/23 1137       Learning Barriers/Preferences   Learning Barriers Sight   wears glasses   Learning Preferences Audio;Computer/Internet;Group Instruction;Individual Instruction;Pictoral;Skilled Demonstration;Video;Verbal Instruction;Written Material             Education Topics:  Knowledge Questionnaire Score:  Knowledge Questionnaire Score - 11/16/23 1138       Knowledge Questionnaire Score   Pre Score 18/24             Core Components/Risk Factors/Patient Goals at Admission:  Personal Goals and Risk Factors at Admission - 11/16/23 1138       Core Components/Risk Factors/Patient Goals on Admission    Weight Management Yes;Obesity    Intervention Weight Management: Provide education and appropriate resources to help participant work on and attain dietary goals.;Weight Management/Obesity: Establish reasonable short term and long term weight goals.;Obesity: Provide education and appropriate resources to help participant work on and attain dietary goals.;Weight Management: Develop a combined nutrition and exercise program designed to reach desired caloric intake, while maintaining appropriate intake of nutrient and fiber, sodium and fats, and appropriate energy expenditure required for the weight goal.    Expected Outcomes Short Term: Continue to assess and modify interventions until short term weight is achieved;Long Term: Adherence to nutrition and physical activity/exercise program aimed toward attainment of established weight goal;Understanding recommendations for meals to  include 15-35% energy as protein, 25-35% energy from fat, 35-60% energy from carbohydrates, less than 200mg  of dietary cholesterol, 20-35 gm of total fiber daily;Understanding of distribution of calorie intake throughout the day with the consumption of 4-5 meals/snacks    Diabetes Yes    Intervention Provide education about signs/symptoms and action to take for hypo/hyperglycemia.;Provide education about proper nutrition, including hydration, and aerobic/resistive exercise prescription along with prescribed medications to achieve blood glucose in normal ranges: Fasting glucose 65-99 mg/dL    Expected Outcomes Short Term: Participant verbalizes understanding of the signs/symptoms and immediate care of hyper/hypoglycemia, proper foot care and importance of medication, aerobic/resistive exercise and nutrition plan for blood glucose control.;Long Term: Attainment of HbA1C < 7%.    Heart Failure Yes    Intervention Provide a combined exercise and nutrition program that is supplemented with education, support and counseling about heart failure. Directed toward relieving symptoms such as shortness of breath, decreased exercise tolerance, and extremity edema.    Expected Outcomes Improve functional capacity of life;Short term: Attendance in program 2-3 days a week with increased exercise capacity. Reported lower sodium intake. Reported increased fruit and vegetable intake. Reports medication compliance.;Short term: Daily weights obtained and reported for increase. Utilizing diuretic protocols set by physician.;Long term: Adoption of self-care skills and reduction of barriers for early signs and symptoms recognition and intervention leading to self-care maintenance.    Hypertension Yes  Intervention Provide education on lifestyle modifcations including regular physical activity/exercise, weight management, moderate sodium restriction and increased consumption of fresh fruit, vegetables, and low fat dairy, alcohol  moderation, and smoking cessation.;Monitor prescription use compliance.    Expected Outcomes Short Term: Continued assessment and intervention until BP is < 140/34mm HG in hypertensive participants. < 130/43mm HG in hypertensive participants with diabetes, heart failure or chronic kidney disease.;Long Term: Maintenance of blood pressure at goal levels.    Lipids Yes    Intervention Provide education and support for participant on nutrition & aerobic/resistive exercise along with prescribed medications to achieve LDL 70mg , HDL >40mg .    Expected Outcomes Short Term: Participant states understanding of desired cholesterol values and is compliant with medications prescribed. Participant is following exercise prescription and nutrition guidelines.;Long Term: Cholesterol controlled with medications as prescribed, with individualized exercise RX and with personalized nutrition plan. Value goals: LDL < 70mg , HDL > 40 mg.             Core Components/Risk Factors/Patient Goals Review:   Goals and Risk Factor Review     Row Name 11/22/23 1429 12/18/23 1710           Core Components/Risk Factors/Patient Goals Review   Personal Goals Review Weight Management/Obesity;Lipids;Heart Failure;Hypertension;Diabetes Weight Management/Obesity;Lipids;Heart Failure;Hypertension;Diabetes      Review Diana [name] started cardiac rehab on 11/22/23. Diana [name] did well with well with exercise. Diana [name] is somewhat deconditioned. Vital signs and CBg's were stable. Diana [name] is doing well with exercise at cardiac rehab. Vital signs and CBg's have been stable. Diana [name] has had some adjustments with her insulin dose due to drop in CBG's post exercise.      Expected Outcomes Diana [name] will continue to participate in cardiac rehab for exercise, nutrition and lifstyle modifications Diana [name] will continue to participate in cardiac rehab for exercise, nutrition and lifstyle modifications               Core Components/Risk Factors/Patient  Goals at Discharge (Final Review):   Goals and Risk Factor Review - 12/18/23 1710       Core Components/Risk Factors/Patient Goals Review   Personal Goals Review Weight Management/Obesity;Lipids;Heart Failure;Hypertension;Diabetes    Review Diana [name] is doing well with exercise at cardiac rehab. Vital signs and CBg's have been stable. Diana [name] has had some adjustments with her insulin dose due to drop in CBG's post exercise.    Expected Outcomes Bailee will continue to participate in cardiac rehab for exercise, nutrition and lifstyle modifications             ITP Comments:  ITP Comments     Row Name 11/16/23 0909 11/22/23 1131 12/18/23 1705       ITP Comments Dr. Armanda Magic medical director. Introduction to pritikin education/intensive cardiac rehab. Initial orientation packet reviewed with patient. 30 day ITP Review. Jodene started cardiac rehab on 11/22/23. Donnabelle did well with exercise. 30 day ITP Review. Aniah has good attendance and participation with exercise at cardiac rehab. Brae will be absent this week due to cold symptoms.              Comments: ***

## 2024-01-16 ENCOUNTER — Encounter (HOSPITAL_COMMUNITY): Payer: Self-pay | Admitting: *Deleted

## 2024-01-16 DIAGNOSIS — Z9889 Other specified postprocedural states: Secondary | ICD-10-CM

## 2024-01-16 DIAGNOSIS — Z951 Presence of aortocoronary bypass graft: Secondary | ICD-10-CM

## 2024-01-16 NOTE — Addendum Note (Signed)
 Encounter addended by: Artist Pais on: 01/16/2024 8:00 AM  Actions taken: Delete clinical note

## 2024-01-16 NOTE — Progress Notes (Signed)
 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 Swallow whole., Disp: , Rfl:    BD INSULIN SYRINGE U/F 1/2UNIT 31G X 5/16" 0.3 ML MISC, , Disp: , Rfl:    Beta Carotene (VITAMIN A) 25000 UNIT capsule, Take 25,000 Units by mouth daily., Disp: , Rfl:    carvedilol (COREG) 3.125 MG tablet, Take 1 tablet (3.125 mg total) by mouth 2 (two) times daily., Disp: 60 tablet, Rfl: 3   cyanocobalamin (VITAMIN B12) 1000 MCG tablet, Take 2,000 mcg by mouth daily., Disp: , Rfl:    dapagliflozin propanediol (FARXIGA) 10 MG TABS tablet, Take 1 tablet (10 mg total) by mouth daily., Disp: 30 tablet, Rfl: 5   ezetimibe (ZETIA) 10 MG tablet, Take 1 tablet (10 mg total) by mouth daily., Disp: 90 tablet, Rfl: 2   furosemide (LASIX) 40 MG tablet, Take 1 tablet (40 mg total) by mouth daily., Disp: 30 tablet,  Rfl: 3   gabapentin (NEURONTIN) 100 MG capsule, Take 100 mg by mouth 2 (two) times daily., Disp: , Rfl:    insulin NPH-regular Human (NOVOLIN 70/30) (70-30) 100 UNIT/ML injection, Inject 30-60 Units into the skin 2 (two) times daily., Disp: , Rfl:    loratadine (CLARITIN) 10 MG tablet, Take 10 mg by mouth daily., Disp: , Rfl:    ondansetron (ZOFRAN-ODT) 8 MG disintegrating tablet, 8 mg every 8 (eight) hours as needed for nausea., Disp: , Rfl:    Oyster Shell (OYSTER CALCIUM) 500 MG TABS tablet, Take 500 mg of elemental calcium by mouth 2 (two) times a week., Disp: , Rfl:    OZEMPIC, 0.25 OR 0.5 MG/DOSE, 2 MG/3ML SOPN, Inject 0.25 mg into the skin once a week for 28 days, THEN 0.5 mg once a week for 28 days., Disp: 3 mL, Rfl: 5   pantoprazole (PROTONIX) 40 MG tablet, Take 40 mg by mouth daily., Disp: , Rfl:    sacubitril-valsartan (ENTRESTO) 24-26 MG, Take 1 tablet by mouth 2 (two) times daily., Disp: 60 tablet, Rfl: 6   spironolactone (ALDACTONE) 25 MG tablet, Take 1 tablet (25 mg total) by mouth daily., Disp: 30 tablet, Rfl: 5   traMADol (ULTRAM) 50 MG tablet, Take 1-2 tablets (50-100 mg total) by mouth every 6 (six) hours as needed for moderate pain (pain score 4-6) or severe  pain (pain score 7-10)., Disp: 30 tablet, Rfl: 0  Past Medical History: Past Medical History:  Diagnosis Date   Arthritis    CAD (coronary artery disease) 7/12-8/12   staged LAD/RCA DES   CHF (congestive heart failure) (HCC)    COPD (chronic obstructive pulmonary disease) (HCC)    Diabetes mellitus (HCC)    Diastolic dysfunction 12/15/2012   grade 1    Dyslipidemia    Dysrhythmia    A. fib post CABG   Heart murmur    Mitral Valve repair November 2024   HTN (hypertension)    Pneumonia     Tobacco Use: Social History   Tobacco Use  Smoking Status Never   Passive exposure: Never  Smokeless Tobacco Never    Labs: Review Flowsheet  More data exists      Latest Ref Rng & Units 09/18/2023 09/19/2023  09/20/2023 09/21/2023 12/28/2023  Labs for ITP Cardiac and Pulmonary Rehab  Hemoglobin A1c 4.8 - 5.6 % - - - - 6.7   O2 Saturation % 59.6  78.6  54.5  56.7  -    Capillary Blood Glucose: Lab Results  Component Value Date   GLUCAP 114 (H) 12/30/2023   GLUCAP 166 (H) 12/30/2023   GLUCAP 105 (H) 12/29/2023   GLUCAP 83 12/29/2023   GLUCAP 78 12/29/2023     Exercise Target Goals: Exercise Program Goal: Individual exercise prescription set using results from initial 6 min walk test and THRR while considering  patient's activity barriers and safety.   Exercise Prescription Goal: Initial exercise prescription builds to 30-45 minutes a day of aerobic activity, 2-3 days per week.  Home exercise guidelines will be given to patient during program as part of exercise prescription that the participant will acknowledge.  Activity Barriers & Risk Stratification:   6 Minute Walk:   Oxygen Initial Assessment:   Oxygen Re-Evaluation:   Oxygen Discharge (Final Oxygen Re-Evaluation):   Initial Exercise Prescription:   Perform Capillary Blood Glucose checks as needed.  Exercise Prescription Changes:   Exercise Prescription Changes     Row Name 11/22/23 1028 12/08/23 1027 12/15/23 1033 12/27/23 1019       Response to Exercise   Blood Pressure (Admit) 110/58 104/56 124/74 112/62    Blood Pressure (Exercise) 108/60 106/54 102/58 --    Blood Pressure (Exit) 108/60 96/58 93/59  114/60    Heart Rate (Admit) 104 bpm 90 bpm 93 bpm 98 bpm    Heart Rate (Exercise) 107 bpm 104 bpm 106 bpm 109 bpm    Heart Rate (Exit) 100 bpm 93 bpm 94 bpm 93 bpm    Rating of Perceived Exertion (Exercise) 11 10 12 11     Symptoms None None None None    Comments Off to a good start with exercise. -- -- --    Duration Progress to 30 minutes of  aerobic without signs/symptoms of physical distress Progress to 30 minutes of  aerobic without signs/symptoms of physical distress Progress to 30 minutes of  aerobic  without signs/symptoms of physical distress Progress to 30 minutes of  aerobic without signs/symptoms of physical distress    Intensity THRR unchanged THRR unchanged THRR unchanged THRR unchanged      Progression   Progression Continue to progress workloads to maintain intensity without signs/symptoms of physical distress. Continue to progress workloads to maintain intensity without signs/symptoms of physical distress. Continue to progress workloads to maintain intensity without signs/symptoms of physical distress. Continue to progress workloads to maintain intensity without signs/symptoms of physical  distress.    Average METs 1.6 2.2 1.8 1.8      Resistance Training   Training Prescription No Yes Yes No    Weight Relaxation day, no weights. 2 lbs 2 lbs Relaxation day, no weights    Reps -- 10-15 10-15 --    Time -- 10 Minutes 10 Minutes 10 Minutes      Interval Training   Interval Training No No No No      NuStep   Level 1 1 1 1     SPM 64 -- 106 96    Minutes 25 25 25 25     METs 1.6 2.2 1.8 1.8             Exercise Comments:   Exercise Comments     Row Name 11/22/23 1132           Exercise Comments Diana [name] tolerated low intensity exercise well without symptoms.                Exercise Goals and Review:   Exercise Goals Re-Evaluation :  Exercise Goals Re-Evaluation     Row Name 11/22/23 1132 12/19/23 0744 01/15/24 0724         Exercise Goal Re-Evaluation   Exercise Goals Review Increase Physical Activity;Increase Strength and Stamina;Able to understand and use rate of perceived exertion (RPE) scale Increase Physical Activity;Increase Strength and Stamina;Able to understand and use rate of perceived exertion (RPE) scale Increase Physical Activity;Increase Strength and Stamina;Able to understand and use rate of perceived exertion (RPE) scale     Comments Patient able to understand and use RPE scale appropriately. Patient is out sick. Unable to review goals at  this time. She is making gradual progress with exericse. Diana [name] had sternal wound drained and sternal wire removed on 12/29/23 and still has wound VAC in place. 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     Expected Outcomes Progress duration to achieve 30 minutes of exercise. Review goal upon return to cardiac rehab. Review goal upon return to cardiac rehab.              Discharge Exercise Prescription (Final Exercise Prescription Changes):  Exercise Prescription Changes - 12/27/23 1019       Response to Exercise   Blood Pressure (Admit) 112/62    Blood Pressure (Exit) 114/60    Heart Rate (Admit) 98 bpm    Heart Rate (Exercise) 109 bpm    Heart Rate (Exit) 93 bpm    Rating of Perceived Exertion (Exercise) 11    Symptoms None    Duration Progress to 30 minutes of  aerobic without signs/symptoms of physical distress    Intensity THRR unchanged      Progression   Progression Continue to progress workloads to maintain intensity without signs/symptoms of physical distress.    Average METs 1.8      Resistance Training   Training Prescription No    Weight Relaxation day, no weights    Time 10 Minutes      Interval Training   Interval Training No      NuStep   Level 1    SPM 96    Minutes 25    METs 1.8             Nutrition:  Target Goals: Understanding of nutrition guidelines, daily intake of sodium 1500mg , cholesterol 200mg , calories 30% from fat and 7% or less from saturated fats, daily to have  5 or more servings of fruits and vegetables.  Biometrics:    Nutrition Therapy Plan and Nutrition Goals:  Nutrition Therapy & Goals - 12/18/23 1022       Nutrition Therapy   Diet Heart Healthy/Carbohydrate Consistent Diet    Drug/Food Interactions --      Personal Nutrition Goals   Nutrition Goal Patient to identify strategies for reducing cardiovascular risk by attending the [facility] education and  nutrition series weekly.   goal in progress.   Personal Goal #2 Patient to improve diet quality by using the plate method as a guide for meal planning to include lean protein/plant protein, fruits, vegetables, whole grains, nonfat dairy as part of a well-balanced diet.   goal in progress.   Comments Goals in progress. Diana [name] has medical history of DM2, hyperlipidemia, CABGx4, chronic systolic heart failure, HTN, s/p mitral valve repair. She continues to attend the [facility] education/nutrition series regularly. A1c and LDL are at goal. She has maintained her weight since starting with our program. She continues regular follow-up with endocrinology and advanced heart failure clinic. Patient will benefit from participation in intensive cardiac rehab for nutrition, exercise, and lifestyle modification.      Intervention Plan   Intervention Prescribe, educate and counsel regarding individualized specific dietary modifications aiming towards targeted core components such as weight, hypertension, lipid management, diabetes, heart failure and other comorbidities.;Nutrition handout(s) given to patient.    Expected Outcomes Short Term Goal: Understand basic principles of dietary content, such as calories, fat, sodium, cholesterol and nutrients.;Long Term Goal: Adherence to prescribed nutrition plan.             Nutrition Assessments:  MEDIFICTS Score Key: >=70 Need to make dietary changes  40-70 Heart Healthy Diet <= 40 Therapeutic Level Cholesterol Diet    Picture Your Plate Scores: <60 Unhealthy dietary pattern with much room for improvement. 41-50 Dietary pattern unlikely to meet recommendations for good health and room for improvement. 51-60 More healthful dietary pattern, with some room for improvement.  >60 Healthy dietary pattern, although there may be some specific behaviors that could be improved.    Nutrition Goals Re-Evaluation:  Nutrition Goals Re-Evaluation     Row Name 12/18/23  1022             Goals   Current Weight 186 lb 4.6 oz (84.5 kg)       Comment Cr 1.11, GFR 52, A1c 6.9, LDL 71, HDL 37, Lpa WNL       Expected Outcome Goals in progress. Diana [name] has medical history of DM2, hyperlipidemia, CABGx4, chronic systolic heart failure, HTN, s/p mitral valve repair. She continues to attend the [facility] education/nutrition series regularly. A1c and LDL are at goal. She has maintained her weight since starting with our program. She continues regular follow-up with endocrinology and advanced heart failure clinic. Patient will benefit from participation in intensive cardiac rehab for nutrition, exercise, and lifestyle modification.                Nutrition Goals Re-Evaluation:  Nutrition Goals Re-Evaluation     Row Name 12/18/23 1022             Goals   Current Weight 186 lb 4.6 oz (84.5 kg)       Comment Cr 1.11, GFR 52, A1c 6.9, LDL 71, HDL 37, Lpa WNL       Expected Outcome Goals in progress. Diana [name] has medical history of DM2, hyperlipidemia, CABGx4, chronic systolic heart failure, HTN, s/p mitral valve repair.  She continues to attend the [facility] education/nutrition series regularly. A1c and LDL are at goal. She has maintained her weight since starting with our program. She continues regular follow-up with endocrinology and advanced heart failure clinic. Patient will benefit from participation in intensive cardiac rehab for nutrition, exercise, and lifestyle modification.                Nutrition Goals Discharge (Final Nutrition Goals Re-Evaluation):  Nutrition Goals Re-Evaluation - 12/18/23 1022       Goals   Current Weight 186 lb 4.6 oz (84.5 kg)    Comment Cr 1.11, GFR 52, A1c 6.9, LDL 71, HDL 37, Lpa WNL    Expected Outcome Goals in progress. Diana [name] has medical history of DM2, hyperlipidemia, CABGx4, chronic systolic heart failure, HTN, s/p mitral valve repair. She continues to attend the [facility] education/nutrition series regularly. A1c and  LDL are at goal. She has maintained her weight since starting with our program. She continues regular follow-up with endocrinology and advanced heart failure clinic. Patient will benefit from participation in intensive cardiac rehab for nutrition, exercise, and lifestyle modification.             Psychosocial: Target Goals: Acknowledge presence or absence of significant depression and/or stress, maximize coping skills, provide positive support system. Participant is able to verbalize types and ability to use techniques and skills needed for reducing stress and depression.  Initial Review & Psychosocial Screening:   Quality of Life Scores:  Scores of 19 and below usually indicate a poorer quality of life in these areas.  A difference of  2-3 points is a clinically meaningful difference.  A difference of 2-3 points in the total score of the Quality of Life Index has been associated with significant improvement in overall quality of life, self-image, physical symptoms, and general health in studies assessing change in quality of life.  PHQ-9: Review Flowsheet       11/16/2023 03/24/2016  Depression screen PHQ 2/9  Decreased Interest 0 0  Down, Depressed, Hopeless 0 0  PHQ - 2 Score 0 0  Altered sleeping 0 -  Tired, decreased energy 0 -  Change in appetite 0 -  Feeling bad or failure about yourself  0 -  Trouble concentrating 0 -  Moving slowly or fidgety/restless 0 -  Suicidal thoughts 0 -  PHQ-9 Score 0 -  Difficult doing work/chores Not difficult at all -   Interpretation of Total Score  Total Score Depression Severity:  1-4 = Minimal depression, 5-9 = Mild depression, 10-14 = Moderate depression, 15-19 = Moderately severe depression, 20-27 = Severe depression   Psychosocial Evaluation and Intervention:   Psychosocial Re-Evaluation:  Psychosocial Re-Evaluation     Row Name 11/22/23 1132 12/18/23 1707 01/16/24 0858         Psychosocial Re-Evaluation   Current issues  with None Identified None Identified None Identified     Comments Diana [name] did not voice any increased concerns or stressors on her first day of exercise. Diana [name] has returned to work . Diana [name] has not voiced any increased concerns or stressors during exercise. Exercise is currently on hold unable to re evaluate     Continue Psychosocial Services  Follow up required by staff Follow up required by staff Follow up required by staff              Psychosocial Discharge (Final Psychosocial Re-Evaluation):  Psychosocial Re-Evaluation - 01/16/24 1610       Psychosocial Re-Evaluation   Current issues with  None Identified    Comments Exercise is currently on hold unable to re evaluate    Continue Psychosocial Services  Follow up required by staff             Vocational Rehabilitation: Provide vocational rehab assistance to qualifying candidates.   Vocational Rehab Evaluation & Intervention:   Education: Education Goals: Education classes will be provided on a weekly basis, covering required topics. Participant will state understanding/return demonstration of topics presented.    Education     Row Name 11/24/23 1100     Education   Cardiac Education Topics Pritikin   Hospital doctor Education   General Education Heart Disease Risk Reduction   Instruction Review Code 1- Verbalizes Understanding   Class Start Time 1145   Class Stop Time 1230   Class Time Calculation (min) 45 min    Row Name 11/27/23 1100     Education   Cardiac Education Topics Pritikin   [employer] Workshops     Workshops   Educator Exercise Physiologist   [employer] Exercise   Exercise Workshop Location manager and Fall Prevention   Instruction Review Code 1- Verbalizes Understanding   Class Start Time 1142   Class Stop Time 1224   Class Time Calculation (min) 42 min    Row Name 11/29/23 1000     Education   Cardiac Education Topics  Pritikin   Customer service manager   Weekly Topic Adding Flavor - Sodium-Free   Instruction Review Code 1- Verbalizes Understanding   Class Start Time 1145   Class Stop Time 1225   Class Time Calculation (min) 40 min    Row Name 12/08/23 1300     Education   Cardiac Education Topics Pritikin   Licensed conveyancer Nutrition   Nutrition Other  Label Reading   Instruction Review Code 1- Verbalizes Understanding   Class Start Time 1152   Class Stop Time 1233   Class Time Calculation (min) 41 min    Row Name 12/11/23 1100     Education   Cardiac Education Topics [facility]ritikin   Actor Nurse   Select Nutrition   Nutrition Becoming a Pritikin Chef   Instruction Review Code 1- Verbalizes Understanding   Class Start Time 1150   Class Stop Time 1226   Class Time Calculation (min) 36 min    Row Name 12/13/23 1300     Education   Cardiac Education Topics Pritikin   Secondary school teacher School   Educator Dietitian;Nurse   Weekly Topic Personalizing Your Pritikin Plate   Instruction Review Code 1- Verbalizes Understanding   Class Start Time 1149   Class Stop Time 1233   Class Time Calculation (min) 44 min    Row Name 12/15/23 1400     Education   Cardiac Education Topics Pritikin   Glass blower/designer Nutrition   Nutrition Workshop Label Reading   Instruction Review Code 1- Verbalizes Understanding   Class Start Time 1145   Class Stop Time 1230   Class Time Calculation (min) 45 min    Row Name 12/27/23 1600     Education   Cardiac  Education Topics Pritikin   Environmental education officer - Meals in a Snap   Instruction Review Code 1- Verbalizes Understanding   Class Start Time 1145   Class Stop Time 1225   Class  Time Calculation (min) 40 min            Core Videos: Exercise    Move It!  Clinical staff conducted group or individual video education with verbal and written material and guidebook.  Patient learns the recommended Pritikin exercise program. Exercise with the goal of living a long, healthy life. Some of the health benefits of exercise include controlled diabetes, healthier blood pressure levels, improved cholesterol levels, improved heart and lung capacity, improved sleep, and better body composition. Everyone should speak with their doctor before starting or changing an exercise routine.  Biomechanical Limitations Clinical staff conducted group or individual video education with verbal and written material and guidebook.  Patient learns how biomechanical limitations can impact exercise and how we can mitigate and possibly overcome limitations to have an impactful and balanced exercise routine.  Body Composition Clinical staff conducted group or individual video education with verbal and written material and guidebook.  Patient learns that body composition (ratio of muscle mass to fat mass) is a key component to assessing overall fitness, rather than body weight alone. Increased fat mass, especially visceral belly fat, can put [place] at increased risk for metabolic syndrome, type 2 diabetes, heart disease, and even death. It is recommended to combine diet and exercise (cardiovascular and resistance training) to improve your body composition. Seek guidance from your physician and exercise physiologist before implementing an exercise routine.  Exercise Action Plan Clinical staff conducted group or individual video education with verbal and written material and guidebook.  Patient learns the recommended strategies to achieve and enjoy long-term exercise adherence, including variety, self-motivation, self-efficacy, and positive decision making. Benefits of exercise include fitness, good health,  weight management, more energy, better sleep, less stress, and overall well-being.  Medical   Heart Disease Risk Reduction Clinical staff conducted group or individual video education with verbal and written material and guidebook.  Patient learns our heart is our most vital organ as it circulates oxygen, nutrients, white blood cells, and hormones throughout the entire body, and carries waste away. Data supports a plant-based eating plan like the Pritikin Program for its effectiveness in slowing progression of and reversing heart disease. The video provides a number of recommendations to address heart disease.   Metabolic Syndrome and Belly Fat  Clinical staff conducted group or individual video education with verbal and written material and guidebook.  Patient learns what metabolic syndrome is, how it leads to heart disease, and how one can reverse it and keep it from coming back. You have metabolic syndrome if you have 3 of the following 5 criteria: abdominal obesity, high blood pressure, high triglycerides, low HDL cholesterol, and high blood sugar.  Hypertension and Heart Disease Clinical staff conducted group or individual video education with verbal and written material and guidebook.  Patient learns that high blood pressure, or hypertension, is very common in the [place]. Hypertension is largely due to excessive salt intake, but other important risk factors include being overweight, physical inactivity, drinking too much alcohol, smoking, and not eating enough potassium from fruits and vegetables. High blood pressure is a leading risk factor for heart attack, stroke, congestive heart failure, dementia, kidney failure, and premature death.  Long-term effects of excessive salt intake include stiffening of the arteries and thickening of heart muscle and organ damage. Recommendations include ways to reduce hypertension and the risk of heart disease.  Diseases of Our Time - Focusing on  Diabetes Clinical staff conducted group or individual video education with verbal and written material and guidebook.  Patient learns why the best way to stop diseases of our time is prevention, through food and other lifestyle changes. Medicine (such as prescription pills and surgeries) is often only a Band-Aid on the problem, not a long-term solution. Most common diseases of our time include obesity, type 2 diabetes, hypertension, heart disease, and cancer. The Pritikin Program is recommended and has been proven to help reduce, reverse, and/or prevent the damaging effects of metabolic syndrome.  Nutrition   Overview of the Pritikin Eating Plan  Clinical staff conducted group or individual video education with verbal and written material and guidebook.  Patient learns about the Pritikin Eating Plan for disease risk reduction. The Pritikin Eating Plan emphasizes a wide variety of unrefined, minimally-processed carbohydrates, like fruits, vegetables, whole grains, and legumes. Go, Caution, and Stop food choices are explained. Plant-based and lean animal proteins are emphasized. Rationale provided for low sodium intake for blood pressure control, low added sugars for blood sugar stabilization, and low added fats and oils for coronary artery disease risk reduction and weight management.  Calorie Density  Clinical staff conducted group or individual video education with verbal and written material and guidebook.  Patient learns about calorie density and how it impacts the Pritikin Eating Plan. Knowing the characteristics of the food you choose will help you decide whether those foods will lead to weight gain or weight loss, and whether you want to consume more or less of them. Weight loss is usually a side effect of the Pritikin Eating Plan because of its focus on low calorie-dense foods.  Label Reading  Clinical staff conducted group or individual video education with verbal and written material and  guidebook.  Patient learns about the Pritikin recommended label reading guidelines and corresponding recommendations regarding calorie density, added sugars, sodium content, and whole grains.  Dining Out - Part 1  Clinical staff conducted group or individual video education with verbal and written material and guidebook.  Patient learns that restaurant meals can be sabotaging because they can be so high in calories, fat, sodium, and/or sugar. Patient learns recommended strategies on how to positively address this and avoid unhealthy pitfalls.  Facts on Fats  Clinical staff conducted group or individual video education with verbal and written material and guidebook.  Patient learns that lifestyle modifications can be just as effective, if not more so, as many medications for lowering your risk of heart disease. A Pritikin lifestyle can help to reduce your risk of inflammation and atherosclerosis (cholesterol build-up, or plaque, in the artery walls). Lifestyle interventions such as dietary choices and physical activity address the cause of atherosclerosis. A review of the types of fats and their impact on blood cholesterol levels, along with dietary recommendations to reduce fat intake is also included.  Nutrition Action Plan  Clinical staff conducted group or individual video education with verbal and written material and guidebook.  Patient learns how to incorporate Pritikin recommendations into their lifestyle. Recommendations include planning and keeping personal health goals in mind as an important part of their success.  Healthy Mind-Set    Healthy Minds, Bodies, Hearts  Clinical staff conducted group or individual video education with verbal and written  material and guidebook.  Patient learns how to identify when they are stressed. Video will discuss the impact of that stress, as well as the many benefits of stress management. Patient will also be introduced to stress management techniques.  The way we think, act, and feel has an impact on our hearts.  How Our Thoughts Can Heal Our Hearts  Clinical staff conducted group or individual video education with verbal and written material and guidebook.  Patient learns that negative thoughts can cause depression and anxiety. This can result in negative lifestyle behavior and serious health problems. Cognitive behavioral therapy is an effective method to help control our thoughts in order to change and improve our emotional outlook.  Additional Videos:  Exercise    Improving Performance  Clinical staff conducted group or individual video education with verbal and written material and guidebook.  Patient learns to use a non-linear approach by alternating intensity levels and lengths of time spent exercising to help burn more calories and lose more body fat. Cardiovascular exercise helps improve heart health, metabolism, hormonal balance, blood sugar control, and recovery from fatigue. Resistance training improves strength, endurance, balance, coordination, reaction time, metabolism, and muscle mass. Flexibility exercise improves circulation, posture, and balance. Seek guidance from your physician and exercise physiologist before implementing an exercise routine and learn your capabilities and proper form for all exercise.  Introduction to Yoga  Clinical staff conducted group or individual video education with verbal and written material and guidebook.  Patient learns about yoga, a discipline of the coming together of mind, breath, and body. The benefits of yoga include improved flexibility, improved range of motion, better posture and core strength, increased lung function, weight loss, and positive self-image. Yoga's heart health benefits include lowered blood pressure, healthier heart rate, decreased cholesterol and triglyceride levels, improved immune function, and reduced stress. Seek guidance from your physician and exercise physiologist  before implementing an exercise routine and learn your capabilities and proper form for all exercise.  Medical   Aging: Enhancing Your Quality of Life  Clinical staff conducted group or individual video education with verbal and written material and guidebook.  Patient learns key strategies and recommendations to stay in good physical health and enhance quality of life, such as prevention strategies, having an advocate, securing a Health Care Proxy and Power of Attorney, and keeping a list of medications and system for tracking them. It also discusses how to avoid risk for bone loss.  Biology of Weight Control  Clinical staff conducted group or individual video education with verbal and written material and guidebook.  Patient learns that weight gain occurs because we consume more calories than we burn (eating more, moving less). Even if your body weight is normal, you may have higher ratios of fat compared to muscle mass. Too much body fat puts you at increased risk for cardiovascular disease, heart attack, stroke, type 2 diabetes, and obesity-related cancers. In addition to exercise, following the Pritikin Eating Plan can help reduce your risk.  Decoding Lab Results  Clinical staff conducted group or individual video education with verbal and written material and guidebook.  Patient learns that lab test reflects one measurement whose values change over time and are influenced by many factors, including medication, stress, sleep, exercise, food, hydration, pre-existing medical conditions, and more. It is recommended to use the knowledge from this video to become more involved with your lab results and evaluate your numbers to speak with your doctor.   Diseases of Our Time - Overview  Clinical staff conducted group or individual video education with verbal and written material and guidebook.  Patient learns that according to the CDC, 50% to 70% of chronic diseases (such as obesity, type 2 diabetes,  elevated lipids, hypertension, and heart disease) are avoidable through lifestyle improvements including healthier food choices, listening to satiety cues, and increased physical activity.  Sleep Disorders Clinical staff conducted group or individual video education with verbal and written material and guidebook.  Patient learns how good quality and duration of sleep are important to overall health and well-being. Patient also learns about sleep disorders and how they impact health along with recommendations to address them, including discussing with a physician.  Nutrition  Dining Out - Part 2 Clinical staff conducted group or individual video education with verbal and written material and guidebook.  Patient learns how to plan ahead and communicate in order to maximize their dining experience in a healthy and nutritious manner. Included are recommended food choices based on the type of restaurant the patient is visiting.   Fueling a Banker conducted group or individual video education with verbal and written material and guidebook.  There is a strong connection between our food choices and our health. Diseases like obesity and type 2 diabetes are very prevalent and are in large-part due to lifestyle choices. The Pritikin Eating Plan provides plenty of food and hunger-curbing satisfaction. It is easy to follow, affordable, and helps reduce health risks.  Menu Workshop  Clinical staff conducted group or individual video education with verbal and written material and guidebook.  Patient learns that restaurant meals can sabotage health goals because they are often packed with calories, fat, sodium, and sugar. Recommendations include strategies to plan ahead and to communicate with the manager, chef, or server to help order a healthier meal.  Planning Your Eating Strategy  Clinical staff conducted group or individual video education with verbal and written material and  guidebook.  Patient learns about the Pritikin Eating Plan and its benefit of reducing the risk of disease. The Pritikin Eating Plan does not focus on calories. Instead, it emphasizes high-quality, nutrient-rich foods. By knowing the characteristics of the foods, we choose, we can determine their calorie density and make informed decisions.  Targeting Your Nutrition Priorities  Clinical staff conducted group or individual video education with verbal and written material and guidebook.  Patient learns that lifestyle habits have a tremendous impact on disease risk and progression. This video provides eating and physical activity recommendations based on your personal health goals, such as reducing LDL cholesterol, losing weight, preventing or controlling type 2 diabetes, and reducing high blood pressure.  Vitamins and Minerals  Clinical staff conducted group or individual video education with verbal and written material and guidebook.  Patient learns different ways to obtain key vitamins and minerals, including through a recommended healthy diet. It is important to discuss all supplements you take with your doctor.   Healthy Mind-Set    Smoking Cessation  Clinical staff conducted group or individual video education with verbal and written material and guidebook.  Patient learns that cigarette smoking and tobacco addiction pose a serious health risk which affects millions of people. Stopping smoking will significantly reduce the risk of heart disease, lung disease, and many forms of cancer. Recommended strategies for quitting are covered, including working with your doctor to develop a successful plan.  Culinary   Becoming a Set designer conducted group or individual video education with verbal and written  material and guidebook.  Patient learns that cooking at home can be healthy, cost-effective, quick, and puts them in control. Keys to cooking healthy recipes will include looking  at your recipe, assessing your equipment needs, planning ahead, making it simple, choosing cost-effective seasonal ingredients, and limiting the use of added fats, salts, and sugars.  Cooking - Breakfast and Snacks  Clinical staff conducted group or individual video education with verbal and written material and guidebook.  Patient learns how important breakfast is to satiety and nutrition through the entire day. Recommendations include key foods to eat during breakfast to help stabilize blood sugar levels and to prevent overeating at meals later in the day. Planning ahead is also a key component.  Cooking - Educational psychologist conducted group or individual video education with verbal and written material and guidebook.  Patient learns eating strategies to improve overall health, including an approach to cook more at home. Recommendations include thinking of animal protein as a side on your plate rather than center stage and focusing instead on lower calorie dense options like vegetables, fruits, whole grains, and plant-based proteins, such as beans. Making sauces in large quantities to freeze for later and leaving the skin on your vegetables are also recommended to maximize your experience.  Cooking - Healthy Salads and Dressing Clinical staff conducted group or individual video education with verbal and written material and guidebook.  Patient learns that vegetables, fruits, whole grains, and legumes are the foundations of the Pritikin Eating Plan. Recommendations include how to incorporate each of these in flavorful and healthy salads, and how to create homemade salad dressings. Proper handling of ingredients is also covered. Cooking - Soups and [employer] - Soups and Desserts Clinical staff conducted group or individual video education with verbal and written material and guidebook.  Patient learns that Pritikin soups and desserts make for easy, nutritious, and delicious snacks  and meal components that are low in sodium, fat, sugar, and calorie density, while high in vitamins, minerals, and filling fiber. Recommendations include simple and healthy ideas for soups and desserts.   Overview     The Pritikin Solution Program Overview Clinical staff conducted group or individual video education with verbal and written material and guidebook.  Patient learns that the results of the Pritikin Program have been documented in more than 100 articles published in peer-reviewed journals, and the benefits include reducing risk factors for (and, in some cases, even reversing) high cholesterol, high blood pressure, type 2 diabetes, obesity, and more! An overview of the three key pillars of the Pritikin Program will be covered: eating well, doing regular exercise, and having a healthy mind-set.  WORKSHOPS  Exercise: Exercise Basics: Building Your Action Plan Clinical staff led group instruction and group discussion with PowerPoint presentation and patient guidebook. To enhance the learning environment the use of posters, models and videos may be added. At the conclusion of this workshop, patients will comprehend the difference between physical activity and exercise, as well as the benefits of incorporating both, into their routine. Patients will understand the FITT (Frequency, Intensity, Time, and Type) principle and how to use it to build an exercise action plan. In addition, safety concerns and other considerations for exercise and cardiac rehab will be addressed by the presenter. The purpose of this lesson is to promote a comprehensive and effective weekly exercise routine in order to improve patients' overall level of fitness.   Managing Heart Disease: Your Path to a Healthier Heart Clinical  staff led group instruction and group discussion with PowerPoint presentation and patient guidebook. To enhance the learning environment the use of posters, models and videos may be added.At the  conclusion of this workshop, patients will understand the anatomy and physiology of the heart. Additionally, they will understand how Pritikin's three pillars impact the risk factors, the progression, and the management of heart disease.  The purpose of this lesson is to provide a high-level overview of the heart, heart disease, and how the Pritikin lifestyle positively impacts risk factors.  Exercise Biomechanics Clinical staff led group instruction and group discussion with PowerPoint presentation and patient guidebook. To enhance the learning environment the use of posters, models and videos may be added. Patients will learn how the structural parts of their bodies function and how these functions impact their daily activities, movement, and exercise. Patients will learn how to promote a neutral spine, learn how to manage pain, and identify ways to improve their physical movement in order to promote healthy living. The purpose of this lesson is to expose patients to common physical limitations that impact physical activity. Participants will learn practical ways to adapt and manage aches and pains, and to minimize their effect on regular exercise. Patients will learn how to maintain good posture while sitting, walking, and lifting.  Balance Training and Fall Prevention  Clinical staff led group instruction and group discussion with PowerPoint presentation and patient guidebook. To enhance the learning environment the use of posters, models and videos may be added. At the conclusion of this workshop, patients will understand the importance of their sensorimotor skills (vision, proprioception, and the vestibular system) in maintaining their ability to balance as they age. Patients will apply a variety of balancing exercises that are appropriate for their current level of function. Patients will understand the common causes for poor balance, possible solutions to these problems, and ways to  modify their physical environment in order to minimize their fall risk. The purpose of this lesson is to teach patients about the importance of maintaining balance as they age and ways to minimize their risk of falling.  WORKSHOPS   Nutrition:  Fueling a Ship broker led group instruction and group discussion with PowerPoint presentation and patient guidebook. To enhance the learning environment the use of posters, models and videos may be added. Patients will review the foundational principles of the Pritikin Eating Plan and understand what constitutes a serving size in each of the food groups. Patients will also learn Pritikin-friendly foods that are better choices when away from home and review make-ahead meal and snack options. Calorie density will be reviewed and applied to three nutrition priorities: weight maintenance, weight loss, and weight gain. The purpose of this lesson is to reinforce (in a group setting) the key concepts around what patients are recommended to eat and how to apply these guidelines when away from home by planning and selecting Pritikin-friendly options. Patients will understand how calorie density may be adjusted for different weight management goals.  Mindful Eating  Clinical staff led group instruction and group discussion with PowerPoint presentation and patient guidebook. To enhance the learning environment the use of posters, models and videos may be added. Patients will briefly review the concepts of the Pritikin Eating Plan and the importance of low-calorie dense foods. The concept of mindful eating will be introduced as well as the importance of paying attention to internal hunger signals. Triggers for non-hunger eating and techniques for dealing with triggers will be explored. The purpose  of this lesson is to provide patients with the opportunity to review the basic principles of the Pritikin Eating Plan, discuss the value of eating mindfully and how to  measure internal cues of hunger and fullness using the Hunger Scale. Patients will also discuss reasons for non-hunger eating and learn strategies to use for controlling emotional eating.  Targeting Your Nutrition Priorities Clinical staff led group instruction and group discussion with PowerPoint presentation and patient guidebook. To enhance the learning environment the use of posters, models and videos may be added. Patients will learn how to determine their genetic susceptibility to disease by reviewing their family history. Patients will gain insight into the importance of diet as part of an overall healthy lifestyle in mitigating the impact of genetics and other environmental insults. The purpose of this lesson is to provide patients with the opportunity to assess their personal nutrition priorities by looking at their family history, their own health history and current risk factors. Patients will also be able to discuss ways of prioritizing and modifying the Pritikin Eating Plan for their highest risk areas  Menu  Clinical staff led group instruction and group discussion with PowerPoint presentation and patient guidebook. To enhance the learning environment the use of posters, models and videos may be added. Using menus brought in from [employer], or printed from [employer], patients will apply the Pritikin dining out guidelines that were presented in the [employer] video. Patients will also be able to practice these guidelines in a variety of provided scenarios. The purpose of this lesson is to provide patients with the opportunity to practice hands-on learning of the Pritikin Dining Out guidelines with actual menus and practice scenarios.  Label Reading Clinical staff led group instruction and group discussion with PowerPoint presentation and patient guidebook. To enhance the learning environment the use of posters, models and videos may be added. Patients will review  and discuss the Pritikin label reading guidelines presented in Pritikin's Label Reading Educational series video. Using fool labels brought in from local grocery stores and markets, patients will apply the label reading guidelines and determine if the packaged food meet the Pritikin guidelines. The purpose of this lesson is to provide patients with the opportunity to review, discuss, and practice hands-on learning of the Pritikin Label Reading guidelines with actual packaged food labels. Cooking School  Pritikin's [employer] are designed to teach patients ways to prepare quick, simple, and affordable recipes at home. The importance of nutrition's role in chronic disease risk reduction is reflected in its emphasis in the overall Pritikin program. By learning how to prepare essential core Pritikin Eating Plan recipes, patients will increase control over what they eat; be able to customize the flavor of foods without the use of added salt, sugar, or fat; and improve the quality of the food they consume. By learning a set of core recipes which are easily assembled, quickly prepared, and affordable, patients are more likely to prepare more healthy foods at home. These workshops focus on convenient breakfasts, simple entres, side dishes, and desserts which can be prepared with minimal effort and are consistent with nutrition recommendations for cardiovascular risk reduction. Cooking [employer] are taught by a Armed forces logistics/support/administrative officer (RD) who has been trained by the [employer]. The chef or RD has a clear understanding of the importance of minimizing - if not completely eliminating - added fat, sugar, and sodium in recipes. Throughout the series of [employer], patients  will learn about healthy ingredients and efficient methods of cooking to build confidence in their capability to prepare    Cooking School weekly topics:  Adding Flavor- Sodium-Free  Fast  and Healthy Breakfasts  Powerhouse Plant-Based Proteins  Satisfying Salads and Dressings  Simple Sides and Sauces  International Cuisine-Spotlight on the Blue Zones  Delicious Desserts  Savory Soups  Efficiency Cooking - Meals in a Snap  Tasty Appetizers and Snacks  Comforting Weekend Breakfasts  One-Pot Wonders   Fast Evening Meals  Landscape architect Your Pritikin Plate  WORKSHOPS   Healthy Mindset (Psychosocial):  Focused Goals, Sustainable Changes Clinical staff led group instruction and group discussion with PowerPoint presentation and patient guidebook. To enhance the learning environment the use of posters, models and videos may be added. Patients will be able to apply effective goal setting strategies to establish at least one personal goal, and then take consistent, meaningful action toward that goal. They will learn to identify common barriers to achieving personal goals and develop strategies to overcome them. Patients will also gain an understanding of how our mind-set can impact our ability to achieve goals and the importance of cultivating a positive and growth-oriented mind-set. The purpose of this lesson is to provide patients with a deeper understanding of how to set and achieve personal goals, as well as the tools and strategies needed to overcome common obstacles which may arise along the way.  From Head to Heart: The Power of a Healthy Outlook  Clinical staff led group instruction and group discussion with PowerPoint presentation and patient guidebook. To enhance the learning environment the use of posters, models and videos may be added. Patients will be able to recognize and describe the impact of emotions and mood on physical health. They will discover the importance of self-care and explore self-care practices which may work for them. Patients will also learn how to utilize the 4 C's to cultivate a healthier outlook and better manage stress and  challenges. The purpose of this lesson is to demonstrate to patients how a healthy outlook is an essential part of maintaining good health, especially as they continue their cardiac rehab journey.  Healthy Sleep for a Healthy Heart Clinical staff led group instruction and group discussion with PowerPoint presentation and patient guidebook. To enhance the learning environment the use of posters, models and videos may be added. At the conclusion of this workshop, patients will be able to demonstrate knowledge of the importance of sleep to overall health, well-being, and quality of life. They will understand the symptoms of, and treatments for, common sleep disorders. Patients will also be able to identify daytime and nighttime behaviors which impact sleep, and they will be able to apply these tools to help manage sleep-related challenges. The purpose of this lesson is to provide patients with a general overview of sleep and outline the importance of quality sleep. Patients will learn about a few of the most common sleep disorders. Patients will also be introduced to the concept of "sleep hygiene," and discover ways to self-manage certain sleeping problems through simple daily behavior changes. Finally, the workshop will motivate patients by clarifying the links between quality sleep and their goals of heart-healthy living.   Recognizing and Reducing Stress Clinical staff led group instruction and group discussion with PowerPoint presentation and patient guidebook. To enhance the learning environment the use of posters, models and videos may be added. At the conclusion of this workshop, patients will be able to understand the types of  stress reactions, differentiate between acute and chronic stress, and recognize the impact that chronic stress has on their health. They will also be able to apply different coping mechanisms, such as reframing negative self-talk. Patients will have the opportunity to practice a  variety of stress management techniques, such as deep abdominal breathing, progressive muscle relaxation, and/or guided imagery.  The purpose of this lesson is to educate patients on the role of stress in their lives and to provide healthy techniques for coping with it.  Learning Barriers/Preferences:   Education Topics:  Knowledge Questionnaire Score:   Core Components/Risk Factors/Patient Goals at Admission:   Core Components/Risk Factors/Patient Goals Review:   Goals and Risk Factor Review     Row Name 11/22/23 1429 12/18/23 1710 01/16/24 0859         Core Components/Risk Factors/Patient Goals Review   Personal Goals Review Weight Management/Obesity;Lipids;Heart Failure;Hypertension;Diabetes Weight Management/Obesity;Lipids;Heart Failure;Hypertension;Diabetes Weight Management/Obesity;Lipids;Heart Failure;Hypertension;Diabetes     Review Diana [name] started cardiac rehab on 11/22/23. Diana [name] did well with well with exercise. Diana [name] is somewhat deconditioned. Vital signs and CBg's were stable. Diana [name] is doing well with exercise at cardiac rehab. Vital signs and CBg's have been stable. Diana [name] has had some adjustments with her insulin dose due to drop in CBG's post exercise. Diana [name]'s exercise is on hold due to  sternal wound debridment and Vac applicaiton. Diana [name]'s next office follow up is on 01/30/24. Await MD clearance to return to group exercise.     Expected Outcomes Diana [name] will continue to participate in cardiac rehab for exercise, nutrition and lifstyle modifications Diana [name] will continue to participate in cardiac rehab for exercise, nutrition and lifstyle modifications Diana [name] will continue to participate in cardiac rehab for exercise, nutrition and lifstyle modifications              Core Components/Risk Factors/Patient Goals at Discharge (Final Review):   Goals and Risk Factor Review - 01/16/24 0859       Core Components/Risk Factors/Patient Goals Review   Personal Goals Review  Weight Management/Obesity;Lipids;Heart Failure;Hypertension;Diabetes    Review Diana [name]'s exercise is on hold due to  sternal wound debridment and Vac applicaiton. Diana [name]'s next office follow up is on 01/30/24. Await MD clearance to return to group exercise.    Expected Outcomes Diana [name] will continue to participate in cardiac rehab for exercise, nutrition and lifstyle modifications             ITP Comments:  ITP Comments     Row Name 11/22/23 1131 12/18/23 1705 01/16/24 0856       ITP Comments 30 day ITP Review. Diana [name] started cardiac rehab on 11/22/23. Diana [name] did well with exercise. 30 day ITP Review. Diana [name] has good attendance and participation with exercise at cardiac rehab. Diana [name] will be absent this week due to cold symptoms. 30 day ITP Review. Kassadee exericse is currently on hold due to I and D/ Vac placement surgery              Comments: See ITP Comments

## 2024-01-17 ENCOUNTER — Encounter (HOSPITAL_COMMUNITY): Payer: Medicare HMO

## 2024-01-19 ENCOUNTER — Encounter (HOSPITAL_COMMUNITY): Payer: Medicare HMO

## 2024-01-22 ENCOUNTER — Encounter (HOSPITAL_COMMUNITY): Payer: Medicare HMO

## 2024-01-22 NOTE — Addendum Note (Signed)
 Encounter addended by: [place], Takeela Peil G, RD on: 01/22/2024 11:18 AM  Actions taken: Flowsheet data copied forward, Flowsheet accepted

## 2024-01-24 ENCOUNTER — Encounter (HOSPITAL_COMMUNITY): Payer: Medicare HMO

## 2024-01-26 ENCOUNTER — Encounter (HOSPITAL_COMMUNITY): Payer: Medicare HMO

## 2024-01-29 ENCOUNTER — Encounter (HOSPITAL_COMMUNITY): Payer: Medicare HMO

## 2024-01-30 ENCOUNTER — Encounter: Payer: Self-pay | Admitting: Thoracic Surgery (Cardiothoracic Vascular Surgery)

## 2024-01-30 ENCOUNTER — Telehealth: Payer: Self-pay

## 2024-01-30 ENCOUNTER — Telehealth (HOSPITAL_COMMUNITY): Payer: Self-pay | Admitting: *Deleted

## 2024-01-30 ENCOUNTER — Ambulatory Visit (INDEPENDENT_AMBULATORY_CARE_PROVIDER_SITE_OTHER): Payer: Self-pay | Admitting: Thoracic Surgery (Cardiothoracic Vascular Surgery)

## 2024-01-30 VITALS — BP 106/67 | HR 99 | Resp 20 | Wt 181.2 lb

## 2024-01-30 DIAGNOSIS — Z5189 Encounter for other specified aftercare: Secondary | ICD-10-CM

## 2024-01-30 NOTE — Telephone Encounter (Signed)
-----   Message from Nurse Carlette C sent at 01/30/2024  2:35 PM EDT ----- Regarding: RE: Resume Cardiac Rehab Great! Thanks for letting us  know. I am sure she is pleased. Carlette ----- Message ----- From: Barbra Ley, RN Sent: 01/30/2024   1:42 PM EDT To: Cyndee Dragon, RN; Nickie Barnes, RN Subject: Resume Cardiac Rehab                           Hello all-  Patient was seen in the clinic today. Her wound vac was discontinued and is doing dressing changes from here on out. Dr. Luna Salinas has cleared her to return to Cardiac Rehab.  Thanks, Odilia Bennett

## 2024-01-30 NOTE — Telephone Encounter (Signed)
-----   Message from Nurse Odilia Bennett B sent at 01/30/2024  1:40 PM EDT ----- Regarding: Resume Cardiac Rehab Hello all-  Patient was seen in the clinic today. Her wound vac was discontinued and is doing dressing changes from here on out. Dr. Luna Salinas has cleared her to return to Cardiac Rehab.  Thanks, Odilia Bennett

## 2024-01-30 NOTE — Progress Notes (Signed)
 [address]       [place] [name] [address]             [phone]     HPI: Diana [name] returns for a wound check  Diana [name] is a 78 year old woman with a history of CAD, severe MR, pulmonary hypertension, heart failure, hypertension, hyperlipidemia, type 2 diabetes, and obesity.  A coronary bypass grafting and mitral valve repair on 09/06/2023.   She presented back with some swelling and drainage from the inferior aspect of her sternal wound.  2 lower sternal wires had pulled through.  We did an I&D and treated with local wound care and it healed up but reoccurred about a month later with more swelling and fluctuance.  She underwent incision and drainage and sternal wire removal and VAC placement on 12/29/2023.  I saw in the office was on 01/09/2024.  The wound was granulating nicely.  Past Medical History:  Diagnosis Date   Arthritis    CAD (coronary artery disease) 7/12-8/12   staged LAD/RCA DES   CHF (congestive heart failure) (HCC)    COPD (chronic obstructive pulmonary disease) (HCC)    Diabetes mellitus (HCC)    Diastolic dysfunction 12/15/2012   grade 1    Dyslipidemia    Dysrhythmia    A. fib post CABG   Heart murmur    Mitral Valve repair November 2024   HTN (hypertension)    Pneumonia     Current Outpatient Medications  Medication Sig Dispense Refill   acetaminophen (TYLENOL) 325 MG tablet Take 2 tablets (650 mg total) by mouth every 6 (six) hours as needed for mild pain (pain score 1-3).     albuterol (VENTOLIN HFA) 108 (90 Base) MCG/ACT inhaler Inhale 1-2 puffs into the lungs every 6 (six) hours as needed for shortness of breath or wheezing.     apixaban (ELIQUIS) 5 MG TABS tablet Take 1 tablet (5 mg total) by mouth 2 (two) times daily. 60 tablet 5   aspirin EC 81 MG tablet Take 1 tablet (81 mg total) by mouth daily. Swallow whole.     BD INSULIN SYRINGE U/F 1/2UNIT 31G X 5/16" 0.3 ML MISC      Beta Carotene (VITAMIN A) 25000 UNIT capsule  Take 25,000 Units by mouth daily.     carvedilol (COREG) 3.125 MG tablet Take 1 tablet (3.125 mg total) by mouth 2 (two) times daily. 60 tablet 3   cyanocobalamin (VITAMIN B12) 1000 MCG tablet Take 2,000 mcg by mouth daily.     dapagliflozin propanediol (FARXIGA) 10 MG TABS tablet Take 1 tablet (10 mg total) by mouth daily. 30 tablet 5   ezetimibe (ZETIA) 10 MG tablet Take 1 tablet (10 mg total) by mouth daily. 90 tablet 2   furosemide (LASIX) 40 MG tablet Take 1 tablet (40 mg total) by mouth daily. 30 tablet 3   gabapentin (NEURONTIN) 100 MG capsule Take 100 mg by mouth 2 (two) times daily.     insulin NPH-regular Human (NOVOLIN 70/30) (70-30) 100 UNIT/ML injection Inject 30-60 Units into the skin 2 (two) times daily.     loratadine (CLARITIN) 10 MG tablet Take 10 mg by mouth daily.     ondansetron (ZOFRAN-ODT) 8 MG disintegrating tablet 8 mg every 8 (eight) hours as needed for nausea.     Oyster Shell (OYSTER CALCIUM) 500 MG TABS tablet Take 500 mg of elemental calcium by mouth 2 (two) times a week.     OZEMPIC, 0.25  OR 0.5 MG/DOSE, 2 MG/3ML SOPN Inject 0.25 mg into the skin once a week for 28 days, THEN 0.5 mg once a week for 28 days. 3 mL 5   pantoprazole (PROTONIX) 40 MG tablet Take 40 mg by mouth daily.     sacubitril-valsartan (ENTRESTO) 24-26 MG Take 1 tablet by mouth 2 (two) times daily. 60 tablet 6   spironolactone (ALDACTONE) 25 MG tablet Take 1 tablet (25 mg total) by mouth daily. 30 tablet 5   traMADol (ULTRAM) 50 MG tablet Take 1-2 tablets (50-100 mg total) by mouth every 6 (six) hours as needed for moderate pain (pain score 4-6) or severe pain (pain score 7-10). 30 tablet 0   No current facility-administered medications for this visit.    Physical Exam BP 106/67 (BP Location: Right Arm, Patient Position: Sitting, Cuff Size: Normal)   Pulse 99   Resp 20   Wt 181 lb 3.2 oz (82.2 kg)   SpO2 98% Comment: RA  BMI 31.45 kg/m  78 year old woman in no acute distress Sternal wound  markedly decreased in size approximately 3 cm in length, 5 mm in width, and 5 mm in depth.  Granulating.  Impression: Diana [name] is a 78 year old woman with a history of CAD, severe MR, pulmonary hypertension, heart failure, hypertension, hyperlipidemia, type 2 diabetes, and obesity. A coronary bypass grafting and mitral valve repair on 09/06/2023. Presented back with dehiscence of the lower portion of her sternal wound. Now has a VAC in place.   Wound healing rapidly with VAC.  There now really is not deep enough for wound to place a VAC sponge.  I think at this point can change to normal saline wet-to-dry dressing changes twice daily.  Her daughter will help her with that.  She may resume cardiac rehab.  Will set her up for an appointment in about a month to check on this but if it heals completely she can cancel that appointment.    Diana Higashi, MD [facility] [phone]

## 2024-01-30 NOTE — Telephone Encounter (Signed)
 Contacted [facility], spoke with Cain Castillo, RN. Advised to discontinue home health per Dr. Audree Bless request. She acknowledged receipt.

## 2024-01-31 ENCOUNTER — Encounter (HOSPITAL_COMMUNITY)
Admission: RE | Admit: 2024-01-31 | Discharge: 2024-01-31 | Disposition: A | Discharge status: Home / Self Care | Payer: Medicare HMO | Source: Ambulatory Visit | Attending: Internal Medicine | Admitting: Internal Medicine

## 2024-01-31 DIAGNOSIS — Z9889 Other specified postprocedural states: Secondary | ICD-10-CM | POA: Diagnosis present

## 2024-01-31 DIAGNOSIS — Z951 Presence of aortocoronary bypass graft: Secondary | ICD-10-CM | POA: Diagnosis present

## 2024-01-31 DIAGNOSIS — E162 Hypoglycemia, unspecified: Secondary | ICD-10-CM | POA: Diagnosis present

## 2024-01-31 LAB — GLUCOSE, CAPILLARY: Glucose-Capillary: 112 mg/dL — ABNORMAL HIGH (ref 70–99)

## 2024-01-31 NOTE — Progress Notes (Signed)
 Patient returned to exercise per Dr Luna Salinas and exercised without difficulty. Vital signs and CBG's were stable.Monte Antonio RN BSN

## 2024-02-02 ENCOUNTER — Encounter (HOSPITAL_COMMUNITY)
Admission: RE | Admit: 2024-02-02 | Discharge: 2024-02-02 | Disposition: A | Discharge status: Home / Self Care | Payer: Medicare HMO | Source: Ambulatory Visit | Attending: Internal Medicine | Admitting: Internal Medicine

## 2024-02-02 DIAGNOSIS — Z951 Presence of aortocoronary bypass graft: Secondary | ICD-10-CM | POA: Diagnosis not present

## 2024-02-02 DIAGNOSIS — Z9889 Other specified postprocedural states: Secondary | ICD-10-CM

## 2024-02-02 DIAGNOSIS — E162 Hypoglycemia, unspecified: Secondary | ICD-10-CM

## 2024-02-05 ENCOUNTER — Encounter (HOSPITAL_COMMUNITY)
Admission: RE | Admit: 2024-02-05 | Discharge: 2024-02-05 | Disposition: A | Discharge status: Home / Self Care | Payer: Medicare HMO | Source: Ambulatory Visit | Attending: Internal Medicine | Admitting: Internal Medicine

## 2024-02-05 DIAGNOSIS — Z9889 Other specified postprocedural states: Secondary | ICD-10-CM

## 2024-02-05 DIAGNOSIS — Z951 Presence of aortocoronary bypass graft: Secondary | ICD-10-CM | POA: Diagnosis not present

## 2024-02-06 NOTE — Progress Notes (Signed)
 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  Patient's Home Medications on Admission:  Current Outpatient Medications:    acetaminophen  (TYLENOL ) 325 MG tablet, Take 2 tablets (650 mg total) by mouth every 6 (six) hours as needed for mild pain (pain score 1-3)., Disp: , Rfl:    albuterol (VENTOLIN HFA) 108 (90 Base) MCG/ACT inhaler, Inhale 1-2 puffs into the lungs every 6 (six) hours as needed for shortness of breath or wheezing., Disp: , Rfl:    apixaban  (ELIQUIS ) 5 MG TABS tablet, Take 1 tablet (5 mg total) by mouth 2 (two) times daily., Disp: 60 tablet, Rfl: 5   aspirin  EC 81 MG tablet, Take 1 tablet (81 mg total) by mouth daily. Swallow whole., Disp: , Rfl:    BD INSULIN  SYRINGE U/F 1/2UNIT 31G X 5/16" 0.3 ML MISC, , Disp: , Rfl:    Beta Carotene (VITAMIN A) 25000 UNIT capsule, Take 25,000 Units by mouth daily., Disp: , Rfl:    carvedilol  (COREG ) 3.125 MG tablet, Take 1 tablet (3.125 mg total) by mouth 2 (two) times daily., Disp: 60 tablet, Rfl: 3   cyanocobalamin (VITAMIN B12) 1000 MCG tablet, Take 2,000 mcg by mouth daily., Disp: , Rfl:    dapagliflozin  propanediol (FARXIGA ) 10 MG TABS tablet, Take 1 tablet (10 mg total) by mouth daily., Disp: 30 tablet, Rfl: 5   ezetimibe  (ZETIA ) 10 MG tablet, Take 1 tablet (10 mg total) by mouth daily., Disp: 90 tablet, Rfl: 2   furosemide  (LASIX ) 40 MG tablet, Take 1 tablet (40 mg total) by mouth daily., Disp: 30 tablet,  Rfl: 3   gabapentin  (NEURONTIN ) 100 MG capsule, Take 100 mg by mouth 2 (two) times daily., Disp: , Rfl:    insulin  NPH-regular Human (NOVOLIN 70/30) (70-30) 100 UNIT/ML injection, Inject 30-60 Units into the skin 2 (two) times daily., Disp: , Rfl:    loratadine  (CLARITIN ) 10 MG tablet, Take 10 mg by mouth daily., Disp: , Rfl:    ondansetron  (ZOFRAN -ODT) 8 MG disintegrating tablet, 8 mg every 8 (eight) hours as needed for nausea., Disp: , Rfl:    Oyster Shell (OYSTER CALCIUM ) 500 MG TABS tablet, Take 500 mg of elemental calcium  by mouth 2 (two) times a week., Disp: , Rfl:    OZEMPIC , 0.25 OR 0.5 MG/DOSE, 2 MG/3ML SOPN, Inject 0.25 mg into the skin once a week for 28 days, THEN 0.5 mg once a week for 28 days., Disp: 3 mL, Rfl: 5   pantoprazole  (PROTONIX ) 40 MG tablet, Take 40 mg by mouth daily., Disp: , Rfl:    sacubitril -valsartan  (ENTRESTO ) 24-26 MG, Take 1 tablet by mouth 2 (two) times daily., Disp: 60 tablet, Rfl: 6   spironolactone  (ALDACTONE ) 25 MG tablet, Take 1 tablet (25 mg total) by mouth daily., Disp: 30 tablet, Rfl: 5   traMADol  (ULTRAM ) 50 MG tablet, Take 1-2 tablets (50-100 mg total) by mouth every 6 (six) hours as needed for moderate pain (pain score 4-6) or severe  pain (pain score 7-10)., Disp: 30 tablet, Rfl: 0  Past Medical History: Past Medical History:  Diagnosis Date   Arthritis    CAD (coronary artery disease) 7/12-8/12   staged LAD/RCA DES   CHF (congestive heart failure) (HCC)    COPD (chronic obstructive pulmonary disease) (HCC)    Diabetes mellitus (HCC)    Diastolic dysfunction 12/15/2012   grade 1    Dyslipidemia    Dysrhythmia    A. fib post CABG   Heart murmur    Mitral Valve repair November 2024   HTN (hypertension)    Pneumonia     Tobacco Use: Social History   Tobacco Use  Smoking Status Never   Passive exposure: Never  Smokeless Tobacco Never    Labs: Review Flowsheet  More data exists      Latest Ref Rng & Units 09/18/2023 09/19/2023  09/20/2023 09/21/2023 12/28/2023  Labs for ITP Cardiac and Pulmonary Rehab  Hemoglobin A1c 4.8 - 5.6 % - - - - 6.7   O2 Saturation % 59.6  78.6  54.5  56.7  -    Capillary Blood Glucose: Lab Results  Component Value Date   GLUCAP 112 (H) 01/31/2024   GLUCAP 114 (H) 12/30/2023   GLUCAP 166 (H) 12/30/2023   GLUCAP 105 (H) 12/29/2023   GLUCAP 83 12/29/2023     Exercise Target Goals: Exercise Program Goal: Individual exercise prescription set using results from initial 6 min walk test and THRR while considering  patient's activity barriers and safety.   Exercise Prescription Goal: Initial exercise prescription builds to 30-45 minutes a day of aerobic activity, 2-3 days per week.  Home exercise guidelines will be given to patient during program as part of exercise prescription that the participant will acknowledge.  Activity Barriers & Risk Stratification:  Activity Barriers & Cardiac Risk Stratification - 11/16/23 1132       Activity Barriers & Cardiac Risk Stratification   Activity Barriers Arthritis;Deconditioning;Shortness of Breath;Decreased Ventricular Function;Balance Concerns;Assistive Device    Cardiac Risk Stratification High   <5 METs on            6 Minute Walk:  6 Minute Walk     Row Name 11/16/23 1131         6 Minute Walk   Phase Initial     Distance 720 feet     Walk Time 6 minutes     # of Rest Breaks 0     MPH 1.36     METS 1.38     RPE 9     Perceived Dyspnea  1     VO2 Peak 4.84     Symptoms Yes (comment)     Comments a little winded- resolved with rest. Used Rollator.     Resting HR 97 bpm     Resting BP 106/60     Resting Oxygen Saturation  94 %     Exercise Oxygen Saturation  during 6 min walk 94 %     Max Ex. HR 118 bpm     Max Ex. BP 118/70     2 Minute Post BP 110/68              Oxygen Initial Assessment:   Oxygen Re-Evaluation:   Oxygen Discharge (Final Oxygen Re-Evaluation):   Initial Exercise Prescription:   Initial Exercise Prescription - 11/16/23 1100       Date of Initial Exercise RX and Referring Provider   Date 11/16/23    Referring Provider  Jules Oar, MD    Expected Discharge Date 02/10/24      NuStep   Level 1    SPM 60    Minutes 15    METs 1.6      Prescription Details   Frequency (times per week) 3    Duration Progress to 30 minutes of continuous aerobic without signs/symptoms of physical distress      Intensity   THRR 40-80% of Max Heartrate 58-118    Ratings of Perceived Exertion 11-13    Perceived Dyspnea 0-4      Progression   Progression Continue progressive overload as per policy without signs/symptoms or physical distress.      Resistance Training   Training Prescription Yes    Weight 2    Reps 10-15             Perform Capillary Blood Glucose checks as needed.  Exercise Prescription Changes:   Exercise Prescription Changes     Row Name 11/22/23 1028 12/08/23 1027 12/15/23 1033 12/27/23 1019 01/31/24 1022     Response to Exercise   Blood Pressure (Admit) 110/58 104/56 124/74 112/62 94/60   Blood Pressure (Exercise) 108/60 106/54 102/58 -- 124/68   Blood Pressure (Exit) 108/60 96/58 93/59  114/60 94/62   Heart Rate (Admit) 104 bpm 90 bpm 93 bpm 98 bpm 100 bpm   Heart Rate (Exercise) 107 bpm 104 bpm 106 bpm 109 bpm 117 bpm   Heart Rate (Exit) 100 bpm 93 bpm 94 bpm 93 bpm 101 bpm   Oxygen Saturation (Exercise) -- -- -- -- 97 %   Rating of Perceived Exertion (Exercise) 11 10 12 11 9    Symptoms None None None None None   Comments Off to a good start with exercise. -- -- -- Reviewed goals with Benancio Bracket.   Duration Progress to 30 minutes of  aerobic without signs/symptoms of physical distress Progress to 30 minutes of  aerobic without signs/symptoms of physical distress Progress to 30 minutes of  aerobic without signs/symptoms of physical distress Progress to 30 minutes of  aerobic without signs/symptoms of physical distress Progress to 30 minutes of   aerobic without signs/symptoms of physical distress   Intensity THRR unchanged THRR unchanged THRR unchanged THRR unchanged THRR unchanged     Progression   Progression Continue to progress workloads to maintain intensity without signs/symptoms of physical distress. Continue to progress workloads to maintain intensity without signs/symptoms of physical distress. Continue to progress workloads to maintain intensity without signs/symptoms of physical distress. Continue to progress workloads to maintain intensity without signs/symptoms of physical distress. Continue to progress workloads to maintain intensity without signs/symptoms of physical distress.   Average METs 1.6 2.2 1.8 1.8 2     Resistance Training   Training Prescription No Yes Yes No No   Weight Relaxation day, no weights. 2 lbs 2 lbs Relaxation day, no weights Relaxation day, no weights   Reps -- 10-15 10-15 -- --   Time -- 10 Minutes 10 Minutes 10 Minutes 10 Minutes     Interval Training   Interval Training No No No No No     NuStep   Level 1 1 1 1 1    SPM 64 -- 106 96 110   Minutes 25 25 25 25 25    METs 1.6 2.2 1.8 1.8 1.8            Exercise Comments:   Exercise Comments     Row Name 11/22/23 1132 01/31/24 1610  Exercise Comments Autry tolerated low intensity exercise well without symptoms. Reviewed goals with Benancio Bracket.               Exercise Goals and Review:   Exercise Goals     Row Name 11/16/23 1133             Exercise Goals   Increase Physical Activity Yes       Intervention Develop an individualized exercise prescription for aerobic and resistive training based on initial evaluation findings, risk stratification, comorbidities and participant's personal goals.;Provide advice, education, support and counseling about physical activity/exercise needs.       Expected Outcomes Short Term: Attend rehab on a regular basis to increase amount of physical activity.;Long Term: Exercising  regularly at least 3-5 days a week.;Long Term: Add in home exercise to make exercise part of routine and to increase amount of physical activity.       Increase Strength and Stamina Yes       Intervention Provide advice, education, support and counseling about physical activity/exercise needs.;Develop an individualized exercise prescription for aerobic and resistive training based on initial evaluation findings, risk stratification, comorbidities and participant's personal goals.       Expected Outcomes Short Term: Increase workloads from initial exercise prescription for resistance, speed, and METs.;Short Term: Perform resistance training exercises routinely during rehab and add in resistance training at home;Long Term: Improve cardiorespiratory fitness, muscular endurance and strength as measured by increased METs and functional capacity ( )       Able to understand and use rate of perceived exertion (RPE) scale Yes       Intervention Provide education and explanation on how to use RPE scale       Expected Outcomes Short Term: Able to use RPE daily in rehab to express subjective intensity level;Long Term:  Able to use RPE to guide intensity level when exercising independently       Knowledge and understanding of Target Heart Rate Range (THRR) Yes       Intervention Provide education and explanation of THRR including how the numbers were predicted and where they are located for reference       Expected Outcomes Short Term: Able to state/look up THRR;Short Term: Able to use daily as guideline for intensity in rehab;Long Term: Able to use THRR to govern intensity when exercising independently       Understanding of Exercise Prescription Yes       Intervention Provide education, explanation, and written materials on patient's individual exercise prescription       Expected Outcomes Short Term: Able to explain program exercise prescription;Long Term: Able to explain home exercise prescription to exercise  independently                Exercise Goals Re-Evaluation :  Exercise Goals Re-Evaluation     Row Name 11/22/23 1132 12/19/23 0744 01/15/24 0724 01/31/24 0949       Exercise Goal Re-Evaluation   Exercise Goals Review Increase Physical Activity;Increase Strength and Stamina;Able to understand and use rate of perceived exertion (RPE) scale Increase Physical Activity;Increase Strength and Stamina;Able to understand and use rate of perceived exertion (RPE) scale Increase Physical Activity;Increase Strength and Stamina;Able to understand and use rate of perceived exertion (RPE) scale Increase Physical Activity;Increase Strength and Stamina;Able to understand and use rate of perceived exertion (RPE) scale    Comments Patient able to understand and use RPE scale appropriately. Patient is out sick. Unable to review goals at this time. She is  making gradual progress with exericse. Kerri had sternal wound drained and sternal wire removed on 12/29/23 and still has wound VAC in place. 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 Jonia returned to exercise today and tolerated low intensity exercise well without symptoms. Her goal is to get back to where she was prior to her hospitalization. She still has balance concerns. She walks with a can for stability. Warm-up and cool-down stretches performed seated.    Expected Outcomes Progress duration to achieve 30 minutes of exercise. Review goal upon return to cardiac rehab. Review goal upon return to cardiac rehab. Progress workloads as tolerated to help increase strength and stamina.             Discharge Exercise Prescription (Final Exercise Prescription Changes):  Exercise Prescription Changes - 01/31/24 1022       Response to Exercise   Blood Pressure (Admit) 94/60    Blood Pressure (Exercise) 124/68    Blood Pressure (Exit) 94/62    Heart Rate (Admit) 100 bpm    Heart Rate (Exercise)  117 bpm    Heart Rate (Exit) 101 bpm    Oxygen Saturation (Exercise) 97 %    Rating of Perceived Exertion (Exercise) 9    Symptoms None    Comments Reviewed goals with Benancio Bracket.    Duration Progress to 30 minutes of  aerobic without signs/symptoms of physical distress    Intensity THRR unchanged      Progression   Progression Continue to progress workloads to maintain intensity without signs/symptoms of physical distress.    Average METs 2      Resistance Training   Training Prescription No    Weight Relaxation day, no weights    Time 10 Minutes      Interval Training   Interval Training No      NuStep   Level 1    SPM 110    Minutes 25    METs 1.8             Nutrition:  Target Goals: Understanding of nutrition guidelines, daily intake of sodium 1500mg , cholesterol 200mg , calories 30% from fat and 7% or less from saturated fats, daily to have 5 or more servings of fruits and vegetables.  Biometrics:  Pre Biometrics - 11/16/23 1130       Pre Biometrics   Waist Circumference 46 inches    Hip Circumference 46 inches    Waist to Hip Ratio 1 %    Triceps Skinfold 37 mm    % Body Fat 47.3 %    Grip Strength 20 kg    Flexibility 0 in   not done, low back pain   Single Leg Stand 1.87 seconds              Nutrition Therapy Plan and Nutrition Goals:  Nutrition Therapy & Goals - 01/22/24 1114       Nutrition Therapy   Diet Heart Healthy/Carbohydrate Consistent Diet      Personal Nutrition Goals   Nutrition Goal Patient to identify strategies for reducing cardiovascular risk by attending the [facility] education and nutrition series weekly.   goal in progress.   Personal Goal #2 Patient to improve diet quality by using the plate method as a guide for meal planning to include lean protein/plant protein, fruits, vegetables, whole grains, nonfat dairy as part of a well-balanced diet.   goal in progress.   Comments Tamsin has not attended  intensive cardiac rehab  since 12/27/23 due to seroma of sternal incision and subsequent surgery. Dua has medical history of DM2, hyperlipidemia, CABGx4, chronic systolic heart failure, HTN, s/p mitral valve repair. Prior to her abscence, She attended the [facility] education/nutrition series regularly. A1c and LDL are at goal. She continues regular follow-up with endocrinology and advanced heart failure clinic. Patient will continue to benefit from participation in intensive cardiac rehab for nutrition, exercise, and lifestyle modification.      Intervention Plan   Intervention Prescribe, educate and counsel regarding individualized specific dietary modifications aiming towards targeted core components such as weight, hypertension, lipid management, diabetes, heart failure and other comorbidities.;Nutrition handout(s) given to patient.    Expected Outcomes Short Term Goal: Understand basic principles of dietary content, such as calories, fat, sodium, cholesterol and nutrients.;Long Term Goal: Adherence to prescribed nutrition plan.             Nutrition Assessments:  MEDIFICTS Score Key: >=70 Need to make dietary changes  40-70 Heart Healthy Diet <= 40 Therapeutic Level Cholesterol Diet    Picture Your Plate Scores: <16 Unhealthy dietary pattern with much room for improvement. 41-50 Dietary pattern unlikely to meet recommendations for good health and room for improvement. 51-60 More healthful dietary pattern, with some room for improvement.  >60 Healthy dietary pattern, although there may be some specific behaviors that could be improved.    Nutrition Goals Re-Evaluation:  Nutrition Goals Re-Evaluation     Row Name 12/18/23 1022 01/22/24 1114           Goals   Current Weight 186 lb 4.6 oz (84.5 kg) 181 lb 14.1 oz (82.5 kg)  weight from last attended session on 12/27/23      Comment Cr 1.11, GFR 52, A1c 6.9, LDL 71, HDL 37, Lpa WNL no new labs; most recent labs Cr 1.11, GFR 52, A1c 6.7, LDL 71, HDL 37,  Lpa WNL      Expected Outcome Goals in progress. Jowanna has medical history of DM2, hyperlipidemia, CABGx4, chronic systolic heart failure, HTN, s/p mitral valve repair. She continues to attend the [facility] education/nutrition series regularly. A1c and LDL are at goal. She has maintained her weight since starting with our program. She continues regular follow-up with endocrinology and advanced heart failure clinic. Patient will benefit from participation in intensive cardiac rehab for nutrition, exercise, and lifestyle modification. Jacalyn has not attended intensive cardiac rehab since 12/27/23 due to seroma of sternal incision and subsequent surgery. Schyler has medical history of DM2, hyperlipidemia, CABGx4, chronic systolic heart failure, HTN, s/p mitral valve repair. Prior to her abscence, She attended the [facility] education/nutrition series regularly. A1c and LDL are at goal. She continues regular follow-up with endocrinology and advanced heart failure clinic. Patient will continue to benefit from participation in intensive cardiac rehab for nutrition, exercise, and lifestyle modification.               Nutrition Goals Re-Evaluation:  Nutrition Goals Re-Evaluation     Row Name 12/18/23 1022 01/22/24 1114           Goals   Current Weight 186 lb 4.6 oz (84.5 kg) 181 lb 14.1 oz (82.5 kg)  weight from last attended session on 12/27/23      Comment Cr 1.11, GFR 52, A1c 6.9, LDL 71, HDL 37, Lpa WNL no new labs; most recent labs Cr 1.11, GFR 52, A1c 6.7, LDL 71, HDL 37, Lpa WNL      Expected Outcome Goals in progress. Benancio Bracket  has medical history of DM2, hyperlipidemia, CABGx4, chronic systolic heart failure, HTN, s/p mitral valve repair. She continues to attend the [facility] education/nutrition series regularly. A1c and LDL are at goal. She has maintained her weight since starting with our program. She continues regular follow-up with endocrinology and advanced heart failure clinic. Patient will benefit  from participation in intensive cardiac rehab for nutrition, exercise, and lifestyle modification. Maximina has not attended intensive cardiac rehab since 12/27/23 due to seroma of sternal incision and subsequent surgery. Genelda has medical history of DM2, hyperlipidemia, CABGx4, chronic systolic heart failure, HTN, s/p mitral valve repair. Prior to her abscence, She attended the [facility] education/nutrition series regularly. A1c and LDL are at goal. She continues regular follow-up with endocrinology and advanced heart failure clinic. Patient will continue to benefit from participation in intensive cardiac rehab for nutrition, exercise, and lifestyle modification.               Nutrition Goals Discharge (Final Nutrition Goals Re-Evaluation):  Nutrition Goals Re-Evaluation - 01/22/24 1114       Goals   Current Weight 181 lb 14.1 oz (82.5 kg)   weight from last attended session on 12/27/23   Comment no new labs; most recent labs Cr 1.11, GFR 52, A1c 6.7, LDL 71, HDL 37, Lpa WNL    Expected Outcome Laasia has not attended intensive cardiac rehab since 12/27/23 due to seroma of sternal incision and subsequent surgery. Lexey has medical history of DM2, hyperlipidemia, CABGx4, chronic systolic heart failure, HTN, s/p mitral valve repair. Prior to her abscence, She attended the [facility] education/nutrition series regularly. A1c and LDL are at goal. She continues regular follow-up with endocrinology and advanced heart failure clinic. Patient will continue to benefit from participation in intensive cardiac rehab for nutrition, exercise, and lifestyle modification.             Psychosocial: Target Goals: Acknowledge presence or absence of significant depression and/or stress, maximize coping skills, provide positive support system. Participant is able to verbalize types and ability to use techniques and skills needed for reducing stress and depression.  Initial Review & Psychosocial Screening:  Initial  Psych Review & Screening - 11/16/23 1134       Initial Review   Current issues with None Identified      Family Dynamics   Good Support System? Yes   Kareem has her husband and children for support   Comments Faustine denies any depression/ anxiety/ stress. She is still very fatigued with ADL's and shared that she is frustrated with how slow progress has been. She is ready to return to her baseline.      Barriers   Psychosocial barriers to participate in program There are no identifiable barriers or psychosocial needs.      Screening Interventions   Interventions Encouraged to exercise;Provide feedback about the scores to participant    Expected Outcomes Short Term goal: Identification and review with participant of any Quality of Life or Depression concerns found by scoring the questionnaire.;Long Term goal: The participant improves quality of Life and PHQ9 Scores as seen by post scores and/or verbalization of changes             Quality of Life Scores:  Quality of Life - 11/16/23 1137       Quality of Life   Select Quality of Life      Quality of Life Scores   Health/Function Pre 19 %    Socioeconomic Pre 29 %    Psych/Spiritual Pre  28.29 %    Family Pre 28.8 %    GLOBAL Pre 24.8 %            Scores of 19 and below usually indicate a poorer quality of life in these areas.  A difference of  2-3 points is a clinically meaningful difference.  A difference of 2-3 points in the total score of the Quality of Life Index has been associated with significant improvement in overall quality of life, self-image, physical symptoms, and general health in studies assessing change in quality of life.  PHQ-9: Review Flowsheet       11/16/2023 03/24/2016  Depression screen PHQ 2/9  Decreased Interest 0 0  Down, Depressed, Hopeless 0 0  PHQ - 2 Score 0 0  Altered sleeping 0 -  Tired, decreased energy 0 -  Change in appetite 0 -  Feeling bad or failure about yourself  0 -  Trouble  concentrating 0 -  Moving slowly or fidgety/restless 0 -  Suicidal thoughts 0 -  PHQ-9 Score 0 -  Difficult doing work/chores Not difficult at all -   Interpretation of Total Score  Total Score Depression Severity:  1-4 = Minimal depression, 5-9 = Mild depression, 10-14 = Moderate depression, 15-19 = Moderately severe depression, 20-27 = Severe depression   Psychosocial Evaluation and Intervention:   Psychosocial Re-Evaluation:  Psychosocial Re-Evaluation     Row Name 11/22/23 1132 12/18/23 1707 01/16/24 0858 02/02/24 1005       Psychosocial Re-Evaluation   Current issues with None Identified None Identified None Identified None Identified    Comments Leanza did not voice any increased concerns or stressors on her first day of exercise. Faatima has returned to work . Cristian has not voiced any increased concerns or stressors during exercise. Exercise is currently on hold unable to re evaluate Nixie returned to cardiac rehab after sternal debridement/ Vac placement. Maion has not voiced any increased concerns or stressors. 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 Maion has not voiced any increased concerns or stressors. Amor is returning to work    Interventions Encouraged to attend Cardiac Rehabilitation for the exercise    Continue Psychosocial Services  No Follow up required             Vocational Rehabilitation: Provide vocational rehab assistance to qualifying candidates.    Vocational Rehab Evaluation & Intervention:  Vocational Rehab - 11/16/23 1138       Initial Vocational Rehab Evaluation & Intervention   Assessment shows need for Vocational Rehabilitation No   Bekka is working            Education: Education Goals: Education classes will be provided on a weekly basis, covering required topics. Participant will state understanding/return demonstration of topics presented.    Education     Row Name 11/24/23 1100     Education   Cardiac Education Topics Pritikin   Nurse, children's  Educator Exercise Industrial/product designer Education   General Education Heart Disease Risk Reduction   Instruction Review Code 1- Verbalizes Understanding   Class Start Time 1145   Class Stop Time 1230   Class Time Calculation (min) 45 min    Row Name 11/27/23 1100     Education   Cardiac Education Topics Pritikin   [employer] Workshops     Workshops   Educator Exercise Physiologist   [employer] Exercise   Exercise Workshop Location manager and Fall Prevention   Instruction Review Code 1- Verbalizes Understanding   Class Start Time 1142   Class Stop Time 1224   Class Time Calculation (min) 42 min    Row Name 11/29/23 1000     Education   Cardiac Education Topics Pritikin   Customer service manager   Weekly Topic Adding Flavor - Sodium-Free   Instruction Review Code 1- Verbalizes Understanding   Class Start Time 1145   Class Stop Time 1225   Class Time Calculation (min) 40 min    Row Name 12/08/23 1300     Education   Cardiac Education Topics Pritikin   Licensed conveyancer Nutrition   Nutrition Other  Label Reading   Instruction Review Code 1- Verbalizes Understanding   Class Start Time 1152   Class Stop Time 1233   Class Time Calculation (min) 41 min    Row Name 12/11/23 1100     Education   Cardiac Education Topics Pritikin    Actor Nurse   Select Nutrition   Nutrition Becoming a Pritikin Chef   Instruction Review Code 1- Verbalizes Understanding   Class Start Time 1150   Class Stop Time 1226   Class Time Calculation (min) 36 min    Row Name 12/13/23 1300     Education   Cardiac Education Topics Pritikin   Secondary school teacher School   Educator Dietitian;Nurse   Weekly Topic Personalizing Your Pritikin Plate   Instruction Review Code 1- Verbalizes Understanding   Class Start Time 1149   Class Stop Time 1233   Class Time Calculation (min) 44 min    Row Name 12/15/23 1400     Education   Cardiac Education Topics Pritikin   Glass blower/designer Nutrition   Nutrition Workshop Label Reading   Instruction Review Code 1- Tax inspector   Class Start Time 1145   Class Stop Time 1230   Class Time Calculation (min) 45 min    Row Name 12/27/23 1600     Education   Cardiac Education Topics Pritikin   Customer service manager   Weekly Topic Efficiency Cooking - Meals in a Snap   Instruction Review Code 1- Verbalizes Understanding   Class Start Time 1145   Class Stop Time 1225   Class Time Calculation (min) 40 min    Row Name 01/31/24 1500     Education   Cardiac Education Topics Pritikin   Customer service manager   Weekly Topic Simple Sides and Sauces   Instruction Review Code 1- Verbalizes Understanding   Class Start Time 1145   Class Stop  Time 1225   Class Time Calculation (min) 40 min    Row Name 02/05/24 1300     Education   Cardiac Education Topics Pritikin   [employer] Workshops     Workshops   Educator Exercise Physiologist   [employer] Exercise   Exercise Workshop Managing Heart Disease: Your Path to a Healthier Heart   Instruction Review Code 1- Verbalizes Understanding   Class Start Time 1145    Class Stop Time 1237   Class Time Calculation (min) 52 min            Core Videos: Exercise    Move It!  Clinical staff conducted group or individual video education with verbal and written material and guidebook.  Patient learns the recommended Pritikin exercise program. Exercise with the goal of living a long, healthy life. Some of the health benefits of exercise include controlled diabetes, healthier blood pressure levels, improved cholesterol levels, improved heart and lung capacity, improved sleep, and better body composition. Everyone should speak with their doctor before starting or changing an exercise routine.  Biomechanical Limitations Clinical staff conducted group or individual video education with verbal and written material and guidebook.  Patient learns how biomechanical limitations can impact exercise and how we can mitigate and possibly overcome limitations to have an impactful and balanced exercise routine.  Body Composition Clinical staff conducted group or individual video education with verbal and written material and guidebook.  Patient learns that body composition (ratio of muscle mass to fat mass) is a key component to assessing overall fitness, rather than body weight alone. Increased fat mass, especially visceral belly fat, can put us  at increased risk for metabolic syndrome, type 2 diabetes, heart disease, and even death. It is recommended to combine diet and exercise (cardiovascular and resistance training) to improve your body composition. Seek guidance from your physician and exercise physiologist before implementing an exercise routine.  Exercise Action Plan Clinical staff conducted group or individual video education with verbal and written material and guidebook.  Patient learns the recommended strategies to achieve and enjoy long-term exercise adherence, including variety, self-motivation, self-efficacy, and positive decision making. Benefits of exercise  include fitness, good health, weight management, more energy, better sleep, less stress, and overall well-being.  Medical   Heart Disease Risk Reduction Clinical staff conducted group or individual video education with verbal and written material and guidebook.  Patient learns our heart is our most vital organ as it circulates oxygen, nutrients, white blood cells, and hormones throughout the entire body, and carries waste away. Data supports a plant-based eating plan like the Pritikin Program for its effectiveness in slowing progression of and reversing heart disease. The video provides a number of recommendations to address heart disease.   Metabolic Syndrome and Belly Fat  Clinical staff conducted group or individual video education with verbal and written material and guidebook.  Patient learns what metabolic syndrome is, how it leads to heart disease, and how one can reverse it and keep it from coming back. You have metabolic syndrome if you have 3 of the following 5 criteria: abdominal obesity, high blood pressure, high triglycerides, low HDL cholesterol, and high blood sugar.  Hypertension and Heart Disease Clinical staff conducted group or individual video education with verbal and written material and guidebook.  Patient learns that high blood pressure, or hypertension, is very common in the [place] . Hypertension is largely due to excessive salt intake, but other important risk factors include being overweight, physical inactivity, drinking too much alcohol, smoking,  and not eating enough potassium from fruits and vegetables. High blood pressure is a leading risk factor for heart attack, stroke, congestive heart failure, dementia, kidney failure, and premature death. Long-term effects of excessive salt intake include stiffening of the arteries and thickening of heart muscle and organ damage. Recommendations include ways to reduce hypertension and the risk of heart disease.  Diseases of  Our Time - Focusing on Diabetes Clinical staff conducted group or individual video education with verbal and written material and guidebook.  Patient learns why the best way to stop diseases of our time is prevention, through food and other lifestyle changes. Medicine (such as prescription pills and surgeries) is often only a Band-Aid on the problem, not a long-term solution. Most common diseases of our time include obesity, type 2 diabetes, hypertension, heart disease, and cancer. The Pritikin Program is recommended and has been proven to help reduce, reverse, and/or prevent the damaging effects of metabolic syndrome.  Nutrition   Overview of the Pritikin Eating Plan  Clinical staff conducted group or individual video education with verbal and written material and guidebook.  Patient learns about the Pritikin Eating Plan for disease risk reduction. The Pritikin Eating Plan emphasizes a wide variety of unrefined, minimally-processed carbohydrates, like fruits, vegetables, whole grains, and legumes. Go, Caution, and Stop food choices are explained. Plant-based and lean animal proteins are emphasized. Rationale provided for low sodium intake for blood pressure control, low added sugars for blood sugar stabilization, and low added fats and oils for coronary artery disease risk reduction and weight management.  Calorie Density  Clinical staff conducted group or individual video education with verbal and written material and guidebook.  Patient learns about calorie density and how it impacts the Pritikin Eating Plan. Knowing the characteristics of the food you choose will help you decide whether those foods will lead to weight gain or weight loss, and whether you want to consume more or less of them. Weight loss is usually a side effect of the Pritikin Eating Plan because of its focus on low calorie-dense foods.  Label Reading  Clinical staff conducted group or individual video education with verbal and  written material and guidebook.  Patient learns about the Pritikin recommended label reading guidelines and corresponding recommendations regarding calorie density, added sugars, sodium content, and whole grains.  Dining Out - Part 1  Clinical staff conducted group or individual video education with verbal and written material and guidebook.  Patient learns that restaurant meals can be sabotaging because they can be so high in calories, fat, sodium, and/or sugar. Patient learns recommended strategies on how to positively address this and avoid unhealthy pitfalls.  Facts on Fats  Clinical staff conducted group or individual video education with verbal and written material and guidebook.  Patient learns that lifestyle modifications can be just as effective, if not more so, as many medications for lowering your risk of heart disease. A Pritikin lifestyle can help to reduce your risk of inflammation and atherosclerosis (cholesterol build-up, or plaque, in the artery walls). Lifestyle interventions such as dietary choices and physical activity address the cause of atherosclerosis. A review of the types of fats and their impact on blood cholesterol levels, along with dietary recommendations to reduce fat intake is also included.  Nutrition Action Plan  Clinical staff conducted group or individual video education with verbal and written material and guidebook.  Patient learns how to incorporate Pritikin recommendations into their lifestyle. Recommendations include planning and keeping personal health goals in mind  as an important part of their success.  Healthy Mind-Set    Healthy Minds, Bodies, Hearts  Clinical staff conducted group or individual video education with verbal and written material and guidebook.  Patient learns how to identify when they are stressed. Video will discuss the impact of that stress, as well as the many benefits of stress management. Patient will also be introduced to stress  management techniques. The way we think, act, and feel has an impact on our hearts.  How Our Thoughts Can Heal Our Hearts  Clinical staff conducted group or individual video education with verbal and written material and guidebook.  Patient learns that negative thoughts can cause depression and anxiety. This can result in negative lifestyle behavior and serious health problems. Cognitive behavioral therapy is an effective method to help control our thoughts in order to change and improve our emotional outlook.  Additional Videos:  Exercise    Improving Performance  Clinical staff conducted group or individual video education with verbal and written material and guidebook.  Patient learns to use a non-linear approach by alternating intensity levels and lengths of time spent exercising to help burn more calories and lose more body fat. Cardiovascular exercise helps improve heart health, metabolism, hormonal balance, blood sugar control, and recovery from fatigue. Resistance training improves strength, endurance, balance, coordination, reaction time, metabolism, and muscle mass. Flexibility exercise improves circulation, posture, and balance. Seek guidance from your physician and exercise physiologist before implementing an exercise routine and learn your capabilities and proper form for all exercise.  Introduction to Yoga  Clinical staff conducted group or individual video education with verbal and written material and guidebook.  Patient learns about yoga, a discipline of the coming together of mind, breath, and body. The benefits of yoga include improved flexibility, improved range of motion, better posture and core strength, increased lung function, weight loss, and positive self-image. Yoga's heart health benefits include lowered blood pressure, healthier heart rate, decreased cholesterol and triglyceride levels, improved immune function, and reduced stress. Seek guidance from your physician and  exercise physiologist before implementing an exercise routine and learn your capabilities and proper form for all exercise.  Medical   Aging: Enhancing Your Quality of Life  Clinical staff conducted group or individual video education with verbal and written material and guidebook.  Patient learns key strategies and recommendations to stay in good physical health and enhance quality of life, such as prevention strategies, having an advocate, securing a Health Care Proxy and Power of Attorney, and keeping a list of medications and system for tracking them. It also discusses how to avoid risk for bone loss.  Biology of Weight Control  Clinical staff conducted group or individual video education with verbal and written material and guidebook.  Patient learns that weight gain occurs because we consume more calories than we burn (eating more, moving less). Even if your body weight is normal, you may have higher ratios of fat compared to muscle mass. Too much body fat puts you at increased risk for cardiovascular disease, heart attack, stroke, type 2 diabetes, and obesity-related cancers. In addition to exercise, following the Pritikin Eating Plan can help reduce your risk.  Decoding Lab Results  Clinical staff conducted group or individual video education with verbal and written material and guidebook.  Patient learns that lab test reflects one measurement whose values change over time and are influenced by many factors, including medication, stress, sleep, exercise, food, hydration, pre-existing medical conditions, and more. It is recommended to use  the knowledge from this video to become more involved with your lab results and evaluate your numbers to speak with your doctor.   Diseases of Our Time - Overview  Clinical staff conducted group or individual video education with verbal and written material and guidebook.  Patient learns that according to the CDC, 50% to 70% of chronic diseases (such as  obesity, type 2 diabetes, elevated lipids, hypertension, and heart disease) are avoidable through lifestyle improvements including healthier food choices, listening to satiety cues, and increased physical activity.  Sleep Disorders Clinical staff conducted group or individual video education with verbal and written material and guidebook.  Patient learns how good quality and duration of sleep are important to overall health and well-being. Patient also learns about sleep disorders and how they impact health along with recommendations to address them, including discussing with a physician.  Nutrition  Dining Out - Part 2 Clinical staff conducted group or individual video education with verbal and written material and guidebook.  Patient learns how to plan ahead and communicate in order to maximize their dining experience in a healthy and nutritious manner. Included are recommended food choices based on the type of restaurant the patient is visiting.   Fueling a Banker conducted group or individual video education with verbal and written material and guidebook.  There is a strong connection between our food choices and our health. Diseases like obesity and type 2 diabetes are very prevalent and are in large-part due to lifestyle choices. The Pritikin Eating Plan provides plenty of food and hunger-curbing satisfaction. It is easy to follow, affordable, and helps reduce health risks.  Menu Workshop  Clinical staff conducted group or individual video education with verbal and written material and guidebook.  Patient learns that restaurant meals can sabotage health goals because they are often packed with calories, fat, sodium, and sugar. Recommendations include strategies to plan ahead and to communicate with the manager, chef, or server to help order a healthier meal.  Planning Your Eating Strategy  Clinical staff conducted group or individual video education with verbal and  written material and guidebook.  Patient learns about the Pritikin Eating Plan and its benefit of reducing the risk of disease. The Pritikin Eating Plan does not focus on calories. Instead, it emphasizes high-quality, nutrient-rich foods. By knowing the characteristics of the foods, we choose, we can determine their calorie density and make informed decisions.  Targeting Your Nutrition Priorities  Clinical staff conducted group or individual video education with verbal and written material and guidebook.  Patient learns that lifestyle habits have a tremendous impact on disease risk and progression. This video provides eating and physical activity recommendations based on your personal health goals, such as reducing LDL cholesterol, losing weight, preventing or controlling type 2 diabetes, and reducing high blood pressure.  Vitamins and Minerals  Clinical staff conducted group or individual video education with verbal and written material and guidebook.  Patient learns different ways to obtain key vitamins and minerals, including through a recommended healthy diet. It is important to discuss all supplements you take with your doctor.   Healthy Mind-Set    Smoking Cessation  Clinical staff conducted group or individual video education with verbal and written material and guidebook.  Patient learns that cigarette smoking and tobacco addiction pose a serious health risk which affects millions of people. Stopping smoking will significantly reduce the risk of heart disease, lung disease, and many forms of cancer. Recommended strategies for quitting are covered,  including working with your doctor to develop a successful plan.  Culinary   Becoming a Set designer conducted group or individual video education with verbal and written material and guidebook.  Patient learns that cooking at home can be healthy, cost-effective, quick, and puts them in control. Keys to cooking healthy recipes  will include looking at your recipe, assessing your equipment needs, planning ahead, making it simple, choosing cost-effective seasonal ingredients, and limiting the use of added fats, salts, and sugars.  Cooking - Breakfast and Snacks  Clinical staff conducted group or individual video education with verbal and written material and guidebook.  Patient learns how important breakfast is to satiety and nutrition through the entire day. Recommendations include key foods to eat during breakfast to help stabilize blood sugar levels and to prevent overeating at meals later in the day. Planning ahead is also a key component.  Cooking - Educational psychologist conducted group or individual video education with verbal and written material and guidebook.  Patient learns eating strategies to improve overall health, including an approach to cook more at home. Recommendations include thinking of animal protein as a side on your plate rather than center stage and focusing instead on lower calorie dense options like vegetables, fruits, whole grains, and plant-based proteins, such as beans. Making sauces in large quantities to freeze for later and leaving the skin on your vegetables are also recommended to maximize your experience.  Cooking - Healthy Salads and Dressing Clinical staff conducted group or individual video education with verbal and written material and guidebook.  Patient learns that vegetables, fruits, whole grains, and legumes are the foundations of the Pritikin Eating Plan. Recommendations include how to incorporate each of these in flavorful and healthy salads, and how to create homemade salad dressings. Proper handling of ingredients is also covered. Cooking - Soups and [employer] - Soups and Desserts Clinical staff conducted group or individual video education with verbal and written material and guidebook.  Patient learns that Pritikin soups and desserts make for easy, nutritious,  and delicious snacks and meal components that are low in sodium, fat, sugar, and calorie density, while high in vitamins, minerals, and filling fiber. Recommendations include simple and healthy ideas for soups and desserts.   Overview     The Pritikin Solution Program Overview Clinical staff conducted group or individual video education with verbal and written material and guidebook.  Patient learns that the results of the Pritikin Program have been documented in more than 100 articles published in peer-reviewed journals, and the benefits include reducing risk factors for (and, in some cases, even reversing) high cholesterol, high blood pressure, type 2 diabetes, obesity, and more! An overview of the three key pillars of the Pritikin Program will be covered: eating well, doing regular exercise, and having a healthy mind-set.  WORKSHOPS  Exercise: Exercise Basics: Building Your Action Plan Clinical staff led group instruction and group discussion with PowerPoint presentation and patient guidebook. To enhance the learning environment the use of posters, models and videos may be added. At the conclusion of this workshop, patients will comprehend the difference between physical activity and exercise, as well as the benefits of incorporating both, into their routine. Patients will understand the FITT (Frequency, Intensity, Time, and Type) principle and how to use it to build an exercise action plan. In addition, safety concerns and other considerations for exercise and cardiac rehab will be addressed by the presenter. The purpose of this lesson  is to promote a comprehensive and effective weekly exercise routine in order to improve patients' overall level of fitness.   Managing Heart Disease: Your Path to a Healthier Heart Clinical staff led group instruction and group discussion with PowerPoint presentation and patient guidebook. To enhance the learning environment the use of posters, models and  videos may be added.At the conclusion of this workshop, patients will understand the anatomy and physiology of the heart. Additionally, they will understand how Pritikin's three pillars impact the risk factors, the progression, and the management of heart disease.  The purpose of this lesson is to provide a high-level overview of the heart, heart disease, and how the Pritikin lifestyle positively impacts risk factors.  Exercise [employer] Clinical staff led group instruction and group discussion with PowerPoint presentation and patient guidebook. To enhance the learning environment the use of posters, models and videos may be added. Patients will learn how the structural parts of their bodies function and how these functions impact their daily activities, movement, and exercise. Patients will learn how to promote a neutral spine, learn how to manage pain, and identify ways to improve their physical movement in order to promote healthy living. The purpose of this lesson is to expose patients to common physical limitations that impact physical activity. Participants will learn practical ways to adapt and manage aches and pains, and to minimize their effect on regular exercise. Patients will learn how to maintain good posture while sitting, walking, and lifting.  Balance Training and Fall Prevention  Clinical staff led group instruction and group discussion with PowerPoint presentation and patient guidebook. To enhance the learning environment the use of posters, models and videos may be added. At the conclusion of this workshop, patients will understand the importance of their sensorimotor skills (vision, proprioception, and the vestibular system) in maintaining their ability to balance as they age. Patients will apply a variety of balancing exercises that are appropriate for their current level of function. Patients will understand the common causes for poor balance, possible solutions to these  problems, and ways to modify their physical environment in order to minimize their fall risk. The purpose of this lesson is to teach patients about the importance of maintaining balance as they age and ways to minimize their risk of falling.  WORKSHOPS   Nutrition:  Fueling a Ship broker led group instruction and group discussion with PowerPoint presentation and patient guidebook. To enhance the learning environment the use of posters, models and videos may be added. Patients will review the foundational principles of the Pritikin Eating Plan and understand what constitutes a serving size in each of the food groups. Patients will also learn Pritikin-friendly foods that are better choices when away from home and review make-ahead meal and snack options. Calorie density will be reviewed and applied to three nutrition priorities: weight maintenance, weight loss, and weight gain. The purpose of this lesson is to reinforce (in a group setting) the key concepts around what patients are recommended to eat and how to apply these guidelines when away from home by planning and selecting Pritikin-friendly options. Patients will understand how calorie density may be adjusted for different weight management goals.  Mindful Eating  Clinical staff led group instruction and group discussion with PowerPoint presentation and patient guidebook. To enhance the learning environment the use of posters, models and videos may be added. Patients will briefly review the concepts of the Pritikin Eating Plan and the importance of low-calorie dense foods. The concept of mindful  eating will be introduced as well as the importance of paying attention to internal hunger signals. Triggers for non-hunger eating and techniques for dealing with triggers will be explored. The purpose of this lesson is to provide patients with the opportunity to review the basic principles of the Pritikin Eating Plan, discuss the value of  eating mindfully and how to measure internal cues of hunger and fullness using the Hunger Scale. Patients will also discuss reasons for non-hunger eating and learn strategies to use for controlling emotional eating.  Targeting Your Nutrition Priorities Clinical staff led group instruction and group discussion with PowerPoint presentation and patient guidebook. To enhance the learning environment the use of posters, models and videos may be added. Patients will learn how to determine their genetic susceptibility to disease by reviewing their family history. Patients will gain insight into the importance of diet as part of an overall healthy lifestyle in mitigating the impact of genetics and other environmental insults. The purpose of this lesson is to provide patients with the opportunity to assess their personal nutrition priorities by looking at their family history, their own health history and current risk factors. Patients will also be able to discuss ways of prioritizing and modifying the Pritikin Eating Plan for their highest risk areas  Menu  Clinical staff led group instruction and group discussion with PowerPoint presentation and patient guidebook. To enhance the learning environment the use of posters, models and videos may be added. Using menus brought in from [employer], or printed from [employer], patients will apply the Pritikin dining out guidelines that were presented in the [employer] video. Patients will also be able to practice these guidelines in a variety of provided scenarios. The purpose of this lesson is to provide patients with the opportunity to practice hands-on learning of the Pritikin Dining Out guidelines with actual menus and practice scenarios.  Label Reading Clinical staff led group instruction and group discussion with PowerPoint presentation and patient guidebook. To enhance the learning environment the use of posters, models and videos may be  added. Patients will review and discuss the Pritikin label reading guidelines presented in Pritikin's Label Reading Educational series video. Using fool labels brought in from local grocery stores and markets, patients will apply the label reading guidelines and determine if the packaged food meet the Pritikin guidelines. The purpose of this lesson is to provide patients with the opportunity to review, discuss, and practice hands-on learning of the Pritikin Label Reading guidelines with actual packaged food labels. Cooking School  Pritikin's [employer] are designed to teach patients ways to prepare quick, simple, and affordable recipes at home. The importance of nutrition's role in chronic disease risk reduction is reflected in its emphasis in the overall Pritikin program. By learning how to prepare essential core Pritikin Eating Plan recipes, patients will increase control over what they eat; be able to customize the flavor of foods without the use of added salt, sugar, or fat; and improve the quality of the food they consume. By learning a set of core recipes which are easily assembled, quickly prepared, and affordable, patients are more likely to prepare more healthy foods at home. These workshops focus on convenient breakfasts, simple entres, side dishes, and desserts which can be prepared with minimal effort and are consistent with nutrition recommendations for cardiovascular risk reduction. Cooking [employer] are taught by a Armed forces logistics/support/administrative officer (RD) who has been trained by the [employer]. The chef or RD  has a clear understanding of the importance of minimizing - if not completely eliminating - added fat, sugar, and sodium in recipes. Throughout the series of Cooking School Workshop sessions, patients will learn about healthy ingredients and efficient methods of cooking to build confidence in their capability to prepare    Cooking School weekly topics:  Adding  Flavor- Sodium-Free  Fast and Healthy Breakfasts  Powerhouse Plant-Based Proteins  Satisfying Salads and Dressings  Simple Sides and Sauces  International Cuisine-Spotlight on the [employer] Zones  Delicious Desserts  Savory Soups  [employer] - Meals in a Astronomer Appetizers and Snacks  Comforting Weekend Breakfasts  One-Pot Wonders   Fast Evening Meals  Landscape architect Your Pritikin Plate  WORKSHOPS   Healthy Mindset (Psychosocial):  Focused Goals, Sustainable Changes Clinical staff led group instruction and group discussion with PowerPoint presentation and patient guidebook. To enhance the learning environment the use of posters, models and videos may be added. Patients will be able to apply effective goal setting strategies to establish at least one personal goal, and then take consistent, meaningful action toward that goal. They will learn to identify common barriers to achieving personal goals and develop strategies to overcome them. Patients will also gain an understanding of how our mind-set can impact our ability to achieve goals and the importance of cultivating a positive and growth-oriented mind-set. The purpose of this lesson is to provide patients with a deeper understanding of how to set and achieve personal goals, as well as the tools and strategies needed to overcome common obstacles which may arise along the way.  From Head to Heart: The Power of a Healthy Outlook  Clinical staff led group instruction and group discussion with PowerPoint presentation and patient guidebook. To enhance the learning environment the use of posters, models and videos may be added. Patients will be able to recognize and describe the impact of emotions and mood on physical health. They will discover the importance of self-care and explore self-care practices which may work for them. Patients will also learn how to utilize the 4 C's to cultivate a healthier outlook and better  manage stress and challenges. The purpose of this lesson is to demonstrate to patients how a healthy outlook is an essential part of maintaining good health, especially as they continue their cardiac rehab journey.  Healthy Sleep for a Healthy Heart Clinical staff led group instruction and group discussion with PowerPoint presentation and patient guidebook. To enhance the learning environment the use of posters, models and videos may be added. At the conclusion of this workshop, patients will be able to demonstrate knowledge of the importance of sleep to overall health, well-being, and quality of life. They will understand the symptoms of, and treatments for, common sleep disorders. Patients will also be able to identify daytime and nighttime behaviors which impact sleep, and they will be able to apply these tools to help manage sleep-related challenges. The purpose of this lesson is to provide patients with a general overview of sleep and outline the importance of quality sleep. Patients will learn about a few of the most common sleep disorders. Patients will also be introduced to the concept of "sleep hygiene," and discover ways to self-manage certain sleeping problems through simple daily behavior changes. Finally, the workshop will motivate patients by clarifying the links between quality sleep and their goals of heart-healthy living.   Recognizing and Reducing Stress Clinical staff led group instruction and group discussion with PowerPoint presentation and patient  guidebook. To enhance the learning environment the use of posters, models and videos may be added. At the conclusion of this workshop, patients will be able to understand the types of stress reactions, differentiate between acute and chronic stress, and recognize the impact that chronic stress has on their health. They will also be able to apply different coping mechanisms, such as reframing negative self-talk. Patients will have the opportunity  to practice a variety of stress management techniques, such as deep abdominal breathing, progressive muscle relaxation, and/or guided imagery.  The purpose of this lesson is to educate patients on the role of stress in their lives and to provide healthy techniques for coping with it.  Learning Barriers/Preferences:  Learning Barriers/Preferences - 11/16/23 1137       Learning Barriers/Preferences   Learning Barriers Sight   wears glasses   Learning Preferences Audio;Computer/Internet;Group Instruction;Individual Instruction;Pictoral;Skilled Demonstration;Video;Verbal Instruction;Written Material             Education Topics:  Knowledge Questionnaire Score:  Knowledge Questionnaire Score - 11/16/23 1138       Knowledge Questionnaire Score   Pre Score 18/24             Core Components/Risk Factors/Patient Goals at Admission:  Personal Goals and Risk Factors at Admission - 11/16/23 1138       Core Components/Risk Factors/Patient Goals on Admission    Weight Management Yes;Obesity    Intervention Weight Management: Provide education and appropriate resources to help participant work on and attain dietary goals.;Weight Management/Obesity: Establish reasonable short term and long term weight goals.;Obesity: Provide education and appropriate resources to help participant work on and attain dietary goals.;Weight Management: Develop a combined nutrition and exercise program designed to reach desired caloric intake, while maintaining appropriate intake of nutrient and fiber, sodium and fats, and appropriate energy expenditure required for the weight goal.    Expected Outcomes Short Term: Continue to assess and modify interventions until short term weight is achieved;Long Term: Adherence to nutrition and physical activity/exercise program aimed toward attainment of established weight goal;Understanding recommendations for meals to include 15-35% energy as protein, 25-35% energy from fat,  35-60% energy from carbohydrates, less than 200mg  of dietary cholesterol, 20-35 gm of total fiber daily;Understanding of distribution of calorie intake throughout the day with the consumption of 4-5 meals/snacks    Diabetes Yes    Intervention Provide education about signs/symptoms and action to take for hypo/hyperglycemia.;Provide education about proper nutrition, including hydration, and aerobic/resistive exercise prescription along with prescribed medications to achieve blood glucose in normal ranges: Fasting glucose 65-99 mg/dL    Expected Outcomes Short Term: Participant verbalizes understanding of the signs/symptoms and immediate care of hyper/hypoglycemia, proper foot care and importance of medication, aerobic/resistive exercise and nutrition plan for blood glucose control.;Long Term: Attainment of HbA1C < 7%.    Heart Failure Yes    Intervention Provide a combined exercise and nutrition program that is supplemented with education, support and counseling about heart failure. Directed toward relieving symptoms such as shortness of breath, decreased exercise tolerance, and extremity edema.    Expected Outcomes Improve functional capacity of life;Short term: Attendance in program 2-3 days a week with increased exercise capacity. Reported lower sodium intake. Reported increased fruit and vegetable intake. Reports medication compliance.;Short term: Daily weights obtained and reported for increase. Utilizing diuretic protocols set by physician.;Long term: Adoption of self-care skills and reduction of barriers for early signs and symptoms recognition and intervention leading to self-care maintenance.    Hypertension Yes  Intervention Provide education on lifestyle modifcations including regular physical activity/exercise, weight management, moderate sodium restriction and increased consumption of fresh fruit, vegetables, and low fat dairy, alcohol moderation, and smoking cessation.;Monitor prescription use  compliance.    Expected Outcomes Short Term: Continued assessment and intervention until BP is < 140/79mm HG in hypertensive participants. < 130/21mm HG in hypertensive participants with diabetes, heart failure or chronic kidney disease.;Long Term: Maintenance of blood pressure at goal levels.    Lipids Yes    Intervention Provide education and support for participant on nutrition & aerobic/resistive exercise along with prescribed medications to achieve LDL 70mg , HDL >40mg .    Expected Outcomes Short Term: Participant states understanding of desired cholesterol values and is compliant with medications prescribed. Participant is following exercise prescription and nutrition guidelines.;Long Term: Cholesterol controlled with medications as prescribed, with individualized exercise RX and with personalized nutrition plan. Value goals: LDL < 70mg , HDL > 40 mg.             Core Components/Risk Factors/Patient Goals Review:   Goals and Risk Factor Review     Row Name 11/22/23 1429 12/18/23 1710 01/16/24 0859 02/02/24 1008       Core Components/Risk Factors/Patient Goals Review   Personal Goals Review Weight Management/Obesity;Lipids;Heart Failure;Hypertension;Diabetes Weight Management/Obesity;Lipids;Heart Failure;Hypertension;Diabetes Weight Management/Obesity;Lipids;Heart Failure;Hypertension;Diabetes Weight Management/Obesity;Lipids;Heart Failure;Hypertension;Diabetes    Review Berdell started cardiac rehab on 11/22/23. Bailley did well with well with exercise. Cariana is somewhat deconditioned. Vital signs and CBg's were stable. Mayia is doing well with exercise at cardiac rehab. Vital signs and CBg's have been stable. Carisa has had some adjustments with her insulin  dose due to drop in CBG's post exercise. Sorcha's exercise is on hold due to  sternal wound debridment and Vac applicaiton. Abigayle's next office follow up is on 01/30/24. Await MD clearance to return to group exercise. Rosa returned to  exercise at cardiac rehab. Vital signs and CBg's were stable. Milderd has lost 1 kg since starting cardiac rehab.    Expected Outcomes Aiyannah will continue to participate in cardiac rehab for exercise, nutrition and lifstyle modifications Jackquline will continue to participate in cardiac rehab for exercise, nutrition and lifstyle modifications Victory will continue to participate in cardiac rehab for exercise, nutrition and lifstyle modifications Mery will continue to participate in cardiac rehab for exercise, nutrition and lifstyle modifications             Core Components/Risk Factors/Patient Goals at Discharge (Final Review):   Goals and Risk Factor Review - 02/02/24 1008       Core Components/Risk Factors/Patient Goals Review   Personal Goals Review Weight Management/Obesity;Lipids;Heart Failure;Hypertension;Diabetes    Review Dalylah returned to exercise at cardiac rehab. Vital signs and CBg's were stable. Sela has lost 1 kg since starting cardiac rehab.    Expected Outcomes Yailen will continue to participate in cardiac rehab for exercise, nutrition and lifstyle modifications             ITP Comments:  ITP Comments     Row Name 11/16/23 0909 11/22/23 1131 12/18/23 1705 01/16/24 0856 02/02/24 1005   ITP Comments Dr. Gaylyn Keas medical director. Introduction to pritikin education/intensive cardiac rehab. Initial orientation packet reviewed with patient. 30 day ITP Review. Jarissa started cardiac rehab on 11/22/23. Wiletta did well with exercise. 30 day ITP Review. Lunette has good attendance and participation with exercise at cardiac rehab. Tajanae will be absent this week due to cold symptoms. 30 day ITP Review. Brinlynn exericse is currently on hold due to I  and D/ Vac placement surgery 30 day ITP Review. Zafira  returned  to exericse at cardiac rehab and exercised without difficulty.            Comments: See ITP Comments

## 2024-02-07 ENCOUNTER — Encounter (HOSPITAL_COMMUNITY)
Admission: RE | Admit: 2024-02-07 | Discharge: 2024-02-07 | Disposition: A | Discharge status: Home / Self Care | Payer: Medicare HMO | Source: Ambulatory Visit | Attending: Internal Medicine

## 2024-02-07 DIAGNOSIS — Z951 Presence of aortocoronary bypass graft: Secondary | ICD-10-CM

## 2024-02-07 DIAGNOSIS — E162 Hypoglycemia, unspecified: Secondary | ICD-10-CM

## 2024-02-07 DIAGNOSIS — Z9889 Other specified postprocedural states: Secondary | ICD-10-CM

## 2024-02-07 LAB — GLUCOSE, CAPILLARY
Glucose-Capillary: 188 mg/dL — ABNORMAL HIGH (ref 70–99)
Glucose-Capillary: 138 mg/dL — ABNORMAL HIGH (ref 70–99)

## 2024-02-09 ENCOUNTER — Encounter (HOSPITAL_COMMUNITY)
Admission: RE | Admit: 2024-02-09 | Discharge: 2024-02-09 | Disposition: A | Discharge status: Home / Self Care | Source: Ambulatory Visit | Attending: Internal Medicine | Admitting: Internal Medicine

## 2024-02-09 DIAGNOSIS — Z9889 Other specified postprocedural states: Secondary | ICD-10-CM

## 2024-02-09 DIAGNOSIS — Z951 Presence of aortocoronary bypass graft: Secondary | ICD-10-CM

## 2024-02-09 NOTE — Progress Notes (Signed)
 Reviewed home exercise guidelines with [facility] including endpoints, temperature precautions, target heart rate and rate of perceived exertion. She is currently walking ~5 minutes, 2 days/week as her mode of home exercise. We discussed increasing her duration 1-2 minutes each session with a goal of reaching 30 minutes continuously or accumulating 30 minutes (15 minutes twice daily), 2 days/ week. Her goal is to increase endurance. She states she can tell a big difference since starting the program. She is able to go up and down stairs easier. Diana [name] voices understanding of instructions given.  Doree Games, MS, ACSM CEP

## 2024-02-12 ENCOUNTER — Encounter (HOSPITAL_COMMUNITY)
Admission: RE | Admit: 2024-02-12 | Discharge: 2024-02-12 | Disposition: A | Discharge status: Home / Self Care | Source: Ambulatory Visit | Attending: Internal Medicine | Admitting: Internal Medicine

## 2024-02-12 DIAGNOSIS — Z9889 Other specified postprocedural states: Secondary | ICD-10-CM

## 2024-02-12 DIAGNOSIS — Z951 Presence of aortocoronary bypass graft: Secondary | ICD-10-CM

## 2024-02-14 ENCOUNTER — Encounter (HOSPITAL_COMMUNITY)
Admission: RE | Admit: 2024-02-14 | Discharge: 2024-02-14 | Disposition: A | Discharge status: Home / Self Care | Source: Ambulatory Visit | Attending: Internal Medicine | Admitting: Internal Medicine

## 2024-02-14 DIAGNOSIS — Z951 Presence of aortocoronary bypass graft: Secondary | ICD-10-CM | POA: Diagnosis not present

## 2024-02-14 DIAGNOSIS — E162 Hypoglycemia, unspecified: Secondary | ICD-10-CM

## 2024-02-14 DIAGNOSIS — Z9889 Other specified postprocedural states: Secondary | ICD-10-CM

## 2024-02-14 LAB — GLUCOSE, CAPILLARY: Glucose-Capillary: 165 mg/dL — ABNORMAL HIGH (ref 70–99)

## 2024-02-15 ENCOUNTER — Other Ambulatory Visit (HOSPITAL_BASED_OUTPATIENT_CLINIC_OR_DEPARTMENT_OTHER): Payer: Self-pay

## 2024-02-16 ENCOUNTER — Encounter (HOSPITAL_COMMUNITY)
Admission: RE | Admit: 2024-02-16 | Discharge: 2024-02-16 | Disposition: A | Discharge status: Home / Self Care | Source: Ambulatory Visit | Attending: Internal Medicine | Admitting: Internal Medicine

## 2024-02-16 DIAGNOSIS — I5022 Chronic systolic (congestive) heart failure: Secondary | ICD-10-CM | POA: Diagnosis present

## 2024-02-16 DIAGNOSIS — Z9889 Other specified postprocedural states: Secondary | ICD-10-CM | POA: Diagnosis present

## 2024-02-16 DIAGNOSIS — E119 Type 2 diabetes mellitus without complications: Secondary | ICD-10-CM | POA: Diagnosis present

## 2024-02-16 DIAGNOSIS — E162 Hypoglycemia, unspecified: Secondary | ICD-10-CM | POA: Insufficient documentation

## 2024-02-16 DIAGNOSIS — Z951 Presence of aortocoronary bypass graft: Secondary | ICD-10-CM | POA: Insufficient documentation

## 2024-02-16 DIAGNOSIS — I2510 Atherosclerotic heart disease of native coronary artery without angina pectoris: Secondary | ICD-10-CM | POA: Diagnosis present

## 2024-02-16 DIAGNOSIS — Z794 Long term (current) use of insulin: Secondary | ICD-10-CM | POA: Insufficient documentation

## 2024-02-17 ENCOUNTER — Other Ambulatory Visit (HOSPITAL_COMMUNITY): Payer: Self-pay | Admitting: Family Medicine

## 2024-02-19 ENCOUNTER — Other Ambulatory Visit (HOSPITAL_COMMUNITY): Payer: Self-pay | Admitting: Internal Medicine

## 2024-02-19 ENCOUNTER — Ambulatory Visit (HOSPITAL_BASED_OUTPATIENT_CLINIC_OR_DEPARTMENT_OTHER)
Admission: RE | Admit: 2024-02-19 | Discharge: 2024-02-19 | Disposition: A | Discharge status: Home / Self Care | Source: Ambulatory Visit

## 2024-02-19 ENCOUNTER — Encounter (HOSPITAL_COMMUNITY)
Admission: RE | Admit: 2024-02-19 | Discharge: 2024-02-19 | Disposition: A | Discharge status: Home / Self Care | Source: Ambulatory Visit | Attending: Internal Medicine | Admitting: Internal Medicine

## 2024-02-19 ENCOUNTER — Inpatient Hospital Stay (HOSPITAL_COMMUNITY)
Admission: RE | Admit: 2024-02-19 | Discharge: 2024-02-19 | Disposition: A | Discharge status: Home / Self Care | Source: Ambulatory Visit | Attending: Internal Medicine | Admitting: Internal Medicine

## 2024-02-19 ENCOUNTER — Encounter (HOSPITAL_COMMUNITY): Payer: Self-pay | Admitting: Internal Medicine

## 2024-02-19 ENCOUNTER — Other Ambulatory Visit (HOSPITAL_COMMUNITY): Payer: Self-pay | Admitting: Radiology

## 2024-02-19 ENCOUNTER — Ambulatory Visit (HOSPITAL_BASED_OUTPATIENT_CLINIC_OR_DEPARTMENT_OTHER)
Admission: RE | Admit: 2024-02-19 | Discharge: 2024-02-19 | Disposition: A | Discharge status: Home / Self Care | Source: Ambulatory Visit | Attending: Internal Medicine | Admitting: Internal Medicine

## 2024-02-19 VITALS — BP 108/74 | HR 94 | Wt 183.4 lb

## 2024-02-19 DIAGNOSIS — I2510 Atherosclerotic heart disease of native coronary artery without angina pectoris: Secondary | ICD-10-CM | POA: Insufficient documentation

## 2024-02-19 DIAGNOSIS — I255 Ischemic cardiomyopathy: Secondary | ICD-10-CM | POA: Insufficient documentation

## 2024-02-19 DIAGNOSIS — Z79899 Other long term (current) drug therapy: Secondary | ICD-10-CM | POA: Insufficient documentation

## 2024-02-19 DIAGNOSIS — E119 Type 2 diabetes mellitus without complications: Secondary | ICD-10-CM

## 2024-02-19 DIAGNOSIS — Z7984 Long term (current) use of oral hypoglycemic drugs: Secondary | ICD-10-CM | POA: Insufficient documentation

## 2024-02-19 DIAGNOSIS — I4891 Unspecified atrial fibrillation: Secondary | ICD-10-CM

## 2024-02-19 DIAGNOSIS — I5022 Chronic systolic (congestive) heart failure: Secondary | ICD-10-CM | POA: Insufficient documentation

## 2024-02-19 DIAGNOSIS — I110 Hypertensive heart disease with heart failure: Secondary | ICD-10-CM | POA: Insufficient documentation

## 2024-02-19 DIAGNOSIS — Z955 Presence of coronary angioplasty implant and graft: Secondary | ICD-10-CM | POA: Insufficient documentation

## 2024-02-19 DIAGNOSIS — Z7982 Long term (current) use of aspirin: Secondary | ICD-10-CM | POA: Insufficient documentation

## 2024-02-19 DIAGNOSIS — Z9889 Other specified postprocedural states: Secondary | ICD-10-CM

## 2024-02-19 DIAGNOSIS — E162 Hypoglycemia, unspecified: Secondary | ICD-10-CM | POA: Diagnosis not present

## 2024-02-19 DIAGNOSIS — Z951 Presence of aortocoronary bypass graft: Secondary | ICD-10-CM

## 2024-02-19 DIAGNOSIS — Z794 Long term (current) use of insulin: Secondary | ICD-10-CM | POA: Insufficient documentation

## 2024-02-19 DIAGNOSIS — I480 Paroxysmal atrial fibrillation: Secondary | ICD-10-CM | POA: Insufficient documentation

## 2024-02-19 DIAGNOSIS — Z7985 Long-term (current) use of injectable non-insulin antidiabetic drugs: Secondary | ICD-10-CM | POA: Insufficient documentation

## 2024-02-19 DIAGNOSIS — E785 Hyperlipidemia, unspecified: Secondary | ICD-10-CM | POA: Insufficient documentation

## 2024-02-19 DIAGNOSIS — J449 Chronic obstructive pulmonary disease, unspecified: Secondary | ICD-10-CM | POA: Insufficient documentation

## 2024-02-19 LAB — BASIC METABOLIC PANEL WITH GFR
Anion gap: 11 (ref 5–15)
BUN: 28 mg/dL — ABNORMAL HIGH (ref 8–23)
CO2: 29 mmol/L (ref 22–32)
Calcium: 9.3 mg/dL (ref 8.9–10.3)
Chloride: 97 mmol/L — ABNORMAL LOW (ref 98–111)
Creatinine, Ser: 1.14 mg/dL — ABNORMAL HIGH (ref 0.44–1.00)
GFR, Estimated: 50 mL/min — ABNORMAL LOW (ref >60)
Glucose, Bld: 99 mg/dL (ref 70–99)
Potassium: 4.7 mmol/L (ref 3.5–5.1)
Sodium: 137 mmol/L (ref 135–145)

## 2024-02-19 LAB — CBC
HCT: 37.5 % (ref 36.0–46.0)
Hemoglobin: 11.5 g/dL — ABNORMAL LOW (ref 12.0–15.0)
MCH: 24.8 pg — ABNORMAL LOW (ref 26.0–34.0)
MCHC: 30.7 g/dL (ref 30.0–36.0)
MCV: 80.8 fL (ref 80.0–100.0)
Platelets: 442 10*3/uL — ABNORMAL HIGH (ref 150–400)
RBC: 4.64 MIL/uL (ref 3.87–5.11)
RDW: 18.6 % — ABNORMAL HIGH (ref 11.5–15.5)
WBC: 6.2 10*3/uL (ref 4.0–10.5)
nRBC: 0 % (ref 0.0–0.2)

## 2024-02-19 LAB — ECHOCARDIOGRAM COMPLETE
AR max vel: 2.37 cm2
AV Area VTI: 2.79 cm2
AV Area mean vel: 2.52 cm2
AV Mean grad: 4 mmHg
AV Peak grad: 7.7 mmHg
Ao pk vel: 1.39 m/s
Area-P 1/2: 5.5 cm2
Calc EF: 34 %
MV VTI: 2.97 cm2
S' Lateral: 4.5 cm
Single Plane A2C EF: 35.9 %
Single Plane A4C EF: 30.9 %

## 2024-02-19 LAB — GLUCOSE, CAPILLARY
Glucose-Capillary: 73 mg/dL (ref 70–99)
Glucose-Capillary: 84 mg/dL (ref 70–99)

## 2024-02-19 LAB — BRAIN NATRIURETIC PEPTIDE: B Natriuretic Peptide: 155.1 pg/mL — ABNORMAL HIGH (ref 0.0–100.0)

## 2024-02-19 NOTE — Addendum Note (Signed)
 Encounter addended by: Glorietta Lark, RN on: 02/19/2024 2:59 PM  Actions taken: Order list changed, Diagnosis association updated, Clinical Note Signed, Charge Capture section accepted

## 2024-02-19 NOTE — Progress Notes (Signed)
 Advanced [facility] Failure Clinic Note   Referring Physician: PCP: Armida Berry, NP PCP-Cardiologist: Magnus Schuller, MD  [facility]: Dr. Julane Ny  Chief Complaint: Heart Failure follow up  HPI:  78 y/o female w/ h/o CAD s/p remote LAD and RCA PCI in 2011, Type 2DM and HLD, recently diagnosed w/ new systolic heart failure and severe MR by echo 11/24. 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 Severe MR also confirmed by TEE. AHF team  was consulted for HF optimization prior to CABG + MVR. She was optimized w/ milrinone  and diuresed w/ IV Lasix . Underwent CABG x4 (LIMA to LAD, SVG to diagonal, SVG to OM, SVG to acute marginal) + MVR by Dr. Luna Salinas. Weaned off pressors and transitioned to oral GDMT. Also developed PAF post op and placed on amio. Discharged home on 12/5.   Developed sternal wound infection.  She underwent incision and drainage and sternal wire removal and VAC placement on 12/29/2023. Site continues to heal well. Small spot still there.   Today she returns for HF follow up with her daughter. Feels much better. Going to CR and walking on her own. No fevers or chills. Can walk at a time. No CP or undue SOB. No edema, orthopnea or PND. At CR initial BP 110-120 and after exercise can go down to 98. BP at home 100-120   Echo today EF 30-35% MVR ok RV ok Personally reviewed   Past Medical History:  Diagnosis Date   Arthritis    CAD (coronary artery disease) 7/12-8/12   staged LAD/RCA DES   CHF (congestive heart failure) (HCC)    COPD (chronic obstructive pulmonary disease) (HCC)    Diabetes mellitus (HCC)    Diastolic dysfunction 12/15/2012   grade 1    Dyslipidemia    Dysrhythmia    A. fib post CABG   Heart murmur    Mitral Valve repair November 2024   HTN (hypertension)    Pneumonia     Current Outpatient Medications  Medication Sig Dispense Refill   acetaminophen   (TYLENOL ) 325 MG tablet Take 2 tablets (650 mg total) by mouth every 6 (six) hours as needed for mild pain (pain score 1-3).     albuterol (VENTOLIN HFA) 108 (90 Base) MCG/ACT inhaler Inhale 1-2 puffs into the lungs every 6 (six) hours as needed for shortness of breath or wheezing.     apixaban  (ELIQUIS ) 5 MG TABS tablet Take 1 tablet (5 mg total) by mouth 2 (two) times daily. 60 tablet 5   aspirin  EC 81 MG tablet Take 1 tablet (81 mg total) by mouth daily. Swallow whole.     BD INSULIN  SYRINGE U/F 1/2UNIT 31G X 5/16" 0.3 ML MISC      Beta Carotene (VITAMIN A) 25000 UNIT capsule Take 25,000 Units by mouth daily.     carvedilol  (COREG ) 3.125 MG tablet Take 1 tablet (3.125 mg total) by mouth 2 (two) times daily. 60 tablet 3   cyanocobalamin (VITAMIN B12) 1000 MCG tablet Take 2,000 mcg by mouth daily.     dapagliflozin  propanediol (FARXIGA ) 10 MG TABS tablet Take 1 tablet (10 mg total) by mouth daily. 30 tablet 5   ezetimibe  (ZETIA ) 10 MG tablet Take 1 tablet (10 mg total) by mouth daily. 90 tablet 2   furosemide  (LASIX ) 40 MG tablet Take 1 tablet (40 mg total) by mouth daily. 30 tablet 3   gabapentin  (NEURONTIN ) 100 MG capsule Take 100 mg  by mouth 2 (two) times daily.     insulin  NPH-regular Human (NOVOLIN 70/30) (70-30) 100 UNIT/ML injection Inject 30-60 Units into the skin 2 (two) times daily.     loratadine  (CLARITIN ) 10 MG tablet Take 10 mg by mouth daily.     ondansetron  (ZOFRAN -ODT) 8 MG disintegrating tablet 8 mg every 8 (eight) hours as needed for nausea.     Oyster Shell (OYSTER CALCIUM ) 500 MG TABS tablet Take 500 mg of elemental calcium  by mouth 2 (two) times a week.     pantoprazole  (PROTONIX ) 40 MG tablet Take 40 mg by mouth daily.     sacubitril -valsartan  (ENTRESTO ) 24-26 MG Take 1 tablet by mouth 2 (two) times daily. 60 tablet 6   Semaglutide ,0.25 or 0.5MG /DOS, (OZEMPIC , 0.25 OR 0.5 MG/DOSE,) 2 MG/1.5ML SOPN Inject 0.25 mg into the skin once a week.     spironolactone  (ALDACTONE ) 25  MG tablet Take 1 tablet (25 mg total) by mouth daily. 30 tablet 5   traMADol  (ULTRAM ) 50 MG tablet Take 1-2 tablets (50-100 mg total) by mouth every 6 (six) hours as needed for moderate pain (pain score 4-6) or severe pain (pain score 7-10). 30 tablet 0   No current facility-administered medications for this encounter.    Allergies  Allergen Reactions   Keflex [Cephalexin] Hives   Lisinopril Cough   Sulfa Antibiotics Hives      Social History   Socioeconomic History   Marital status: Married    Spouse name: Not on file   Number of children: Not on file   Years of education: Not on file   Highest education level: Not on file  Occupational History   Not on file  Tobacco Use   Smoking status: Never    Passive exposure: Never   Smokeless tobacco: Never  Vaping Use   Vaping status: Never Used  Substance and Sexual Activity   Alcohol use: Yes    Comment: Very Rare   Drug use: Never   Sexual activity: Not Currently    Partners: Male  Other Topics Concern   Not on file  Social History Narrative   Not on file   Social Drivers of Health   Financial Resource Strain: Not on file  Food Insecurity: Low Risk  (02/06/2024)   Received from [facility]   Hunger Vital Sign    Worried About Running Out of Food in the Last Year: Never true    Ran Out of Food in the Last Year: Never true  Transportation Needs: No Transportation Needs (02/06/2024)   Received from [employer]    In the past 12 months, has lack of reliable transportation kept you from medical appointments, meetings, work or from getting things needed for daily living? : No  Physical Activity: Not on file  Stress: Not on file  Social Connections: Moderately Integrated (12/30/2023)   Social Connection and Isolation Panel [NHANES]    Frequency of Communication with Friends and Family: More than three times a week    Frequency of Social Gatherings with Friends and Family: More than three times a week     Attends Religious Services: 1 to 4 times per year    Active Member of [employer] or Organizations: No    Attends Banker Meetings: Never    Marital Status: Married  Catering manager Violence: Not At Risk (12/30/2023)   Humiliation, Afraid, Rape, and Kick questionnaire    Fear of Current or Ex-Partner: No    Emotionally Abused:  No    Physically Abused: No    Sexually Abused: No      Family History  Problem Relation Age of Onset   CAD Neg Hx     Vitals:   02/19/24 1404  BP: 108/74  Pulse: 94  SpO2: 98%  Weight: 83.2 kg (183 lb 6.4 oz)    Filed Weights   02/19/24 1404  Weight: 83.2 kg (183 lb 6.4 oz)     PHYSICAL EXAM: General:  Well appearing. No resp difficulty HEENT: normal Neck: supple. no JVD. Carotids 2+ bilat; no bruits. No lymphadenopathy or thryomegaly appreciated. Cor: PMI nondisplaced. Sternal wound healed well Regular rate & rhythm. No rubs, gallops or murmurs. Lungs: clear Abdomen: obese soft, nontender, nondistended. No hepatosplenomegaly. No bruits or masses. Good bowel sounds. Extremities: no cyanosis, clubbing, rash, edema Neuro: alert & orientedx3, cranial nerves grossly intact. moves all 4 extremities w/o difficulty. Affect pleasant   ASSESSMENT & PLAN:  1. Chronic Systolic Heart Failure  - Echo 2014 w/ normal EF 50-55%, normal RV - Echo 11/24 EF 25-30%, severe MR, RV low normal  - Ischemic CM. LHC w/ severe MVCAD.  - RHC 11/24 c/w decompensated heart failure, severe LV dysfunction, and severe mitral regurgitation (RA 21, PAP 83/37/56, PCWP 36, CO 4.11, CI 2.04) - TEE: EF 25%, RV severely reduced, severe LAE, 3-4+ MR - S/p CABG X 4 (LIMA to LAD, SVG to diagonal, SVG to OM, SVG to acute marginal) + MVRepair on 11/20 - Post Op Echo 12/24: EF 30-35%, s/p MV repair with trivial MR and mean gradient 5 mmHg.  - echo today 02/19/24: EF 30-35% MVR stable. Inferior wall AK. RV ok Personally reviewed - NYHA II. Volume ok  - Continue Lasix  to 60 mg  daily - Continue Entresto  24-26 mg bid. - Continue farxiga  10 mg daily - Continue spironolactone  25 mg  - Carvedilol  3.125 bid  - Will continue to follow. If EF remains < 35% consider ICD   2. H/o Severe MR s/p MVRepair 11/24 - appeared ischemic w/ posterior leaflet tethering on echo 11/24. TEE 3-4+ MR - Now s/p MVR 11/20 at time of CABG 11/24 - MVR remains stable on echo today - discussed need for SBE prophylaxis   3. Severe MVCAD  - S/p CABG X 4 (LIMA to LAD, SVG to diagonal, SVG to OM, SVG to acute marginal) on 11/20 - No s/s angina - ASA 81 mg daily  - Crestor  40 mg daily   4. Post Op PAF/Atrial Tach  - in NSR  - now off amio - will place Zio x 14 days. If no AF can stop Eliquis    5. Type 2DM  - Hgb A1c 6.4% - Follows with Dr. Katina Parlor (Endo @ [facility])  6. Sternal Wound - resolved  Jules Oar, MD 02/19/24

## 2024-02-19 NOTE — Progress Notes (Signed)
  Echocardiogram 2D Echocardiogram has been performed.  Diana [name] L Diana [name] RDCS 02/19/2024, 1:34 PM

## 2024-02-19 NOTE — Patient Instructions (Signed)
 Medication Changes:  None, continue current medications  Lab Work:  Labs done today, your results will be available in MyChart, we will contact you for abnormal readings.  Testing/Procedures:  Your provider has recommended that  you wear a Zio Patch for 14 days.  This monitor will record your heart rhythm for our review.  IF you have any symptoms while wearing the monitor please press the button.  If you have any issues with the patch or you notice a red or orange light on it please call the company at [phone].  Once you remove the patch please mail it back to the company as soon as possible so we can get the results.   Special Instructions // Education:  Do the following things EVERYDAY: Weigh yourself in the morning before breakfast. Write it down and keep it in a log. Take your medicines as prescribed Eat low salt foods--Limit salt (sodium) to 2000 mg per day.  Stay as active as you can everyday Limit all fluids for the day to less than 2 liters   Follow-Up in: 4 months (September), **PLEASE CALL OUR OFFICE IN JUNE TO SCHEDULE THIS APPOINTMENT   At the [facility], you and your health needs are our priority. We have a designated team specialized in the treatment of Heart Failure. This Care Team includes your primary Heart Failure Specialized Cardiologist (physician), Advanced Practice Providers (APPs- Physician Assistants and Nurse Practitioners), and Pharmacist who all work together to provide you with the care you need, when you need it.   You may see any of the following providers on your designated Care Team at your next follow up:  Dr. Jules Oar Dr. Peder Bourdon Dr. Alwin Baars Dr. Judyth Nunnery Nieves Bars, NP Ruddy Corral, [place] [facility] [place], [place] Dennise Fitz, NP [place] Lee, NP Luster Salters, PharmD   Please be sure to bring in all your medications bottles to every appointment.   Need to Contact Us :  If you have  any questions or concerns before your next appointment please send us  a message through [place] or call our office at [phone].    TO LEAVE A MESSAGE FOR THE NURSE SELECT OPTION 2, PLEASE LEAVE A MESSAGE INCLUDING: YOUR NAME DATE OF BIRTH CALL BACK NUMBER REASON FOR CALL**this is important as we prioritize the call backs  YOU WILL RECEIVE A CALL BACK THE SAME DAY AS LONG AS YOU CALL BEFORE 4:00 PM

## 2024-02-21 ENCOUNTER — Encounter (HOSPITAL_COMMUNITY)
Admission: RE | Admit: 2024-02-21 | Discharge: 2024-02-21 | Disposition: A | Discharge status: Home / Self Care | Source: Ambulatory Visit | Attending: Internal Medicine

## 2024-02-21 DIAGNOSIS — Z951 Presence of aortocoronary bypass graft: Secondary | ICD-10-CM

## 2024-02-21 DIAGNOSIS — Z9889 Other specified postprocedural states: Secondary | ICD-10-CM

## 2024-02-23 ENCOUNTER — Encounter (HOSPITAL_COMMUNITY)
Admission: RE | Admit: 2024-02-23 | Discharge: 2024-02-23 | Disposition: A | Discharge status: Home / Self Care | Source: Ambulatory Visit | Attending: Internal Medicine | Admitting: Internal Medicine

## 2024-02-23 DIAGNOSIS — Z9889 Other specified postprocedural states: Secondary | ICD-10-CM

## 2024-02-23 DIAGNOSIS — Z951 Presence of aortocoronary bypass graft: Secondary | ICD-10-CM

## 2024-02-26 ENCOUNTER — Encounter (HOSPITAL_COMMUNITY)
Admission: RE | Admit: 2024-02-26 | Discharge: 2024-02-26 | Disposition: A | Discharge status: Home / Self Care | Source: Ambulatory Visit | Attending: Internal Medicine

## 2024-02-26 DIAGNOSIS — Z951 Presence of aortocoronary bypass graft: Secondary | ICD-10-CM | POA: Diagnosis not present

## 2024-02-26 DIAGNOSIS — Z9889 Other specified postprocedural states: Secondary | ICD-10-CM

## 2024-02-26 DIAGNOSIS — E162 Hypoglycemia, unspecified: Secondary | ICD-10-CM

## 2024-02-26 LAB — GLUCOSE, CAPILLARY
Glucose-Capillary: 120 mg/dL — ABNORMAL HIGH (ref 70–99)
Glucose-Capillary: 95 mg/dL (ref 70–99)

## 2024-02-28 ENCOUNTER — Encounter (HOSPITAL_COMMUNITY)
Admission: RE | Admit: 2024-02-28 | Discharge: 2024-02-28 | Disposition: A | Discharge status: Home / Self Care | Source: Ambulatory Visit | Attending: Internal Medicine | Admitting: Internal Medicine

## 2024-02-28 DIAGNOSIS — Z9889 Other specified postprocedural states: Secondary | ICD-10-CM

## 2024-02-28 DIAGNOSIS — Z951 Presence of aortocoronary bypass graft: Secondary | ICD-10-CM

## 2024-03-01 ENCOUNTER — Encounter (HOSPITAL_COMMUNITY)
Admission: RE | Admit: 2024-03-01 | Discharge: 2024-03-01 | Disposition: A | Discharge status: Home / Self Care | Source: Ambulatory Visit | Attending: Internal Medicine | Admitting: Internal Medicine

## 2024-03-01 VITALS — BP 100/62 | HR 98 | Ht 64.0 in | Wt 186.9 lb

## 2024-03-01 DIAGNOSIS — Z951 Presence of aortocoronary bypass graft: Secondary | ICD-10-CM

## 2024-03-01 DIAGNOSIS — Z9889 Other specified postprocedural states: Secondary | ICD-10-CM

## 2024-03-01 NOTE — Progress Notes (Signed)
 Discharge Progress Report  Patient Details  Name: Diana [name] MRN: [id number] Date of Birth: 06/15/1946 Referring Provider:   Flowsheet Row INTENSIVE CARDIAC REHAB ORIENT from 11/16/2023 in [facility]  Referring Provider Jules Oar, MD        Number of Visits: 14  Reason for Discharge:  Patient reached a stable level of exercise. Patient independent in their exercise. Patient has met program and personal goals.  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  ADL [facility]:   Initial Exercise Prescription:  Initial Exercise Prescription - 11/16/23 1100       Date of Initial Exercise RX and Referring Provider   Date 11/16/23    Referring Provider Jules Oar, MD    Expected Discharge Date 02/10/24      NuStep   Level 1    SPM 60    Minutes 15    METs 1.6      Prescription Details   Frequency (times per week) 3    Duration Progress to 30 minutes of continuous aerobic without signs/symptoms of physical distress      Intensity   THRR 40-80% of Max Heartrate 58-118    Ratings of Perceived Exertion 11-13    Perceived Dyspnea 0-4      Progression   Progression Continue progressive overload as per policy without signs/symptoms or physical distress.      Resistance Training   Training Prescription Yes    Weight 2    Reps 10-15             Discharge Exercise Prescription (Final Exercise Prescription Changes):  Exercise Prescription Changes - 03/01/24 1015       Response to Exercise   Blood Pressure (Admit) 100/62    Blood Pressure (Exit) 94/64    Heart Rate (Admit) 98 bpm    Heart Rate (Exercise) 108 bpm    Heart Rate (Exit) 103 bpm    Rating of Perceived Exertion (Exercise) 9    Symptoms None    Comments Diana [name] completed the  cardiac rehab program today.    Duration Progress to 30 minutes of  aerobic without signs/symptoms of physical distress    Intensity THRR unchanged      Progression   Progression Continue to progress workloads to maintain intensity without signs/symptoms of physical distress.    Average METs 2      Resistance Training   Training Prescription Yes    Weight 2 lbs    Reps 10-15    Time 10 Minutes      Interval Training   Interval Training No      NuStep   Level 2    SPM 123    Minutes 25    METs 2      Home Exercise Plan   Plans to continue exercise at Home (comment)   Walking   Frequency Add 2 additional days to program exercise sessions.    Initial Home Exercises Provided 02/09/24             Functional Capacity:  6 Minute Walk     Row Name 11/16/23 1131 02/23/24 1044       6 Minute Walk   Phase Initial Discharge    Distance 720 feet 1052 feet    Distance %  Change -- 46.11 %    Distance Feet Change -- 332 ft    Walk Time 6 minutes 6 minutes    # of Rest Breaks 0 0    MPH 1.36 1.99    METS 1.38 2.04    RPE 9 7    Perceived Dyspnea  1 1    VO2 Peak 4.84 7.16    Symptoms Yes (comment) Yes (comment)    Comments a little winded- resolved with rest. Used Rollator. Mild shortness of breath    Resting HR 97 bpm 90 bpm    Resting BP 106/60 118/78    Resting Oxygen Saturation  94 % 99 %    Exercise Oxygen Saturation  during 6 min walk 94 % 96 %    Max Ex. HR 118 bpm 122 bpm    Max Ex. BP 118/70 126/52    2 Minute Post BP 110/68 98/80             Psychological, QOL, Others - Outcomes: PHQ 2/9:    03/01/2024    9:53 AM 11/16/2023   11:30 AM 03/24/2016    9:14 AM  Depression screen PHQ 2/9  Decreased Interest 0 0 0  Down, Depressed, Hopeless 0 0 0  PHQ - 2 Score 0 0 0  Altered sleeping 0 0   Tired, decreased energy 0 0   Change in appetite 0 0   Feeling bad or failure about yourself  0 0   Trouble concentrating 0 0   Moving slowly or fidgety/restless  0 0   Suicidal thoughts 0 0   PHQ-9 Score 0 0   Difficult doing work/chores Not difficult at all Not difficult at all     Quality of Life:  Quality of Life - 03/01/24 0956       Quality of Life   Select Quality of Life      Quality of Life Scores   Health/Function Pre 19 %    Health/Function Post 28.62 %    Health/Function % Change 50.63 %    Socioeconomic Pre 29 %    Socioeconomic Post 30 %    Socioeconomic % Change  3.45 %    Psych/Spiritual Pre 28.29 %    Psych/Spiritual Post 29.14 %    Psych/Spiritual % Change 3 %    Family Pre 28.8 %    Family Post 27.6 %    Family % Change -4.17 %    GLOBAL Pre 24.8 %    GLOBAL Post 28.88 %    GLOBAL % Change 16.45 %             Personal Goals: Goals established at orientation with interventions provided to work toward goal.  Personal Goals and Risk Factors at Admission - 11/16/23 1138       Core Components/Risk Factors/Patient Goals on Admission    Weight Management Yes;Obesity    Intervention Weight Management: Provide education and appropriate resources to help participant work on and attain dietary goals.;Weight Management/Obesity: Establish reasonable short term and long term weight goals.;Obesity: Provide education and appropriate resources to help participant work on and attain dietary goals.;Weight Management: Develop a combined nutrition and exercise program designed to reach desired caloric intake, while maintaining appropriate intake of nutrient and fiber, sodium and fats, and appropriate energy expenditure required for the weight goal.    Expected Outcomes Short Term: Continue to assess and modify interventions until short term weight is achieved;Long Term: Adherence to nutrition and physical activity/exercise program aimed  toward attainment of established weight goal;Understanding recommendations for meals to include 15-35% energy as protein, 25-35% energy from fat, 35-60% energy from carbohydrates, less than 200mg  of  dietary cholesterol, 20-35 gm of total fiber daily;Understanding of distribution of calorie intake throughout the day with the consumption of 4-5 meals/snacks    Diabetes Yes    Intervention Provide education about signs/symptoms and action to take for hypo/hyperglycemia.;Provide education about proper nutrition, including hydration, and aerobic/resistive exercise prescription along with prescribed medications to achieve blood glucose in normal ranges: Fasting glucose 65-99 mg/dL    Expected Outcomes Short Term: Participant verbalizes understanding of the signs/symptoms and immediate care of hyper/hypoglycemia, proper foot care and importance of medication, aerobic/resistive exercise and nutrition plan for blood glucose control.;Long Term: Attainment of HbA1C < 7%.    Heart Failure Yes    Intervention Provide a combined exercise and nutrition program that is supplemented with education, support and counseling about heart failure. Directed toward relieving symptoms such as shortness of breath, decreased exercise tolerance, and extremity edema.    Expected Outcomes Improve functional capacity of life;Short term: Attendance in program 2-3 days a week with increased exercise capacity. Reported lower sodium intake. Reported increased fruit and vegetable intake. Reports medication compliance.;Short term: Daily weights obtained and reported for increase. Utilizing diuretic protocols set by physician.;Long term: Adoption of self-care skills and reduction of barriers for early signs and symptoms recognition and intervention leading to self-care maintenance.    Hypertension Yes    Intervention Provide education on lifestyle modifcations including regular physical activity/exercise, weight management, moderate sodium restriction and increased consumption of fresh fruit, vegetables, and low fat dairy, alcohol moderation, and smoking cessation.;Monitor prescription use compliance.    Expected Outcomes Short Term:  Continued assessment and intervention until BP is < 140/78mm HG in hypertensive participants. < 130/40mm HG in hypertensive participants with diabetes, heart failure or chronic kidney disease.;Long Term: Maintenance of blood pressure at goal levels.    Lipids Yes    Intervention Provide education and support for participant on nutrition & aerobic/resistive exercise along with prescribed medications to achieve LDL 70mg , HDL >40mg .    Expected Outcomes Short Term: Participant states understanding of desired cholesterol values and is compliant with medications prescribed. Participant is following exercise prescription and nutrition guidelines.;Long Term: Cholesterol controlled with medications as prescribed, with individualized exercise RX and with personalized nutrition plan. Value goals: LDL < 70mg , HDL > 40 mg.              Personal Goals Discharge:  Goals and Risk Factor Review     Row Name 11/22/23 1429 12/18/23 1710 01/16/24 0859 02/02/24 1008 03/12/24 0927     Core Components/Risk Factors/Patient Goals Review   Personal Goals Review Weight Management/Obesity;Lipids;Heart Failure;Hypertension;Diabetes Weight Management/Obesity;Lipids;Heart Failure;Hypertension;Diabetes Weight Management/Obesity;Lipids;Heart Failure;Hypertension;Diabetes Weight Management/Obesity;Lipids;Heart Failure;Hypertension;Diabetes Weight Management/Obesity;Lipids;Heart Failure;Hypertension;Diabetes   Review Diana [name] started cardiac rehab on 11/22/23. Diana [name] did well with well with exercise. Diana [name] is somewhat deconditioned. Vital signs and CBg's were stable. Diana [name] is doing well with exercise at cardiac rehab. Vital signs and CBg's have been stable. Diana [name] has had some adjustments with her insulin  dose due to drop in CBG's post exercise. Diana [name]'s exercise is on hold due to  sternal wound debridment and Vac applicaiton. Diana [name]'s next office follow up is on 01/30/24. Await MD clearance to return to group exercise. Diana [name]  returned to exercise at cardiac rehab. Vital signs and CBg's were stable. Diana [name] has lost 1 kg since starting cardiac rehab. Diana [name] completed cardiac rehab on 03/01/24. Vital  signs and CBG's were stable.   Expected Outcomes Diana [name] will continue to participate in cardiac rehab for exercise, nutrition and lifstyle modifications Diana [name] will continue to participate in cardiac rehab for exercise, nutrition and lifstyle modifications Diana [name] will continue to participate in cardiac rehab for exercise, nutrition and lifstyle modifications Diana [name] will continue to participate in cardiac rehab for exercise, nutrition and lifstyle modifications Diana [name] will continue to  exercise,  follow nutrition and lifstyle modifications upon completion of cardiac rehab.            Exercise Goals and Review:  Exercise Goals     Row Name 11/16/23 1133             Exercise Goals   Increase Physical Activity Yes       Intervention Develop an individualized exercise prescription for aerobic and resistive training based on initial evaluation findings, risk stratification, comorbidities and participant's personal goals.;Provide advice, education, support and counseling about physical activity/exercise needs.       Expected Outcomes Short Term: Attend rehab on a regular basis to increase amount of physical activity.;Long Term: Exercising regularly at least 3-5 days a week.;Long Term: Add in home exercise to make exercise part of routine and to increase amount of physical activity.       Increase Strength and Stamina Yes       Intervention Provide advice, education, support and counseling about physical activity/exercise needs.;Develop an individualized exercise prescription for aerobic and resistive training based on initial evaluation findings, risk stratification, comorbidities and participant's personal goals.       Expected Outcomes Short Term: Increase workloads from initial exercise prescription for resistance, speed,  and METs.;Short Term: Perform resistance training exercises routinely during rehab and add in resistance training at home;Long Term: Improve cardiorespiratory fitness, muscular endurance and strength as measured by increased METs and functional capacity ( )       Able to understand and use rate of perceived exertion (RPE) scale Yes       Intervention Provide education and explanation on how to use RPE scale       Expected Outcomes Short Term: Able to use RPE daily in rehab to express subjective intensity level;Long Term:  Able to use RPE to guide intensity level when exercising independently       Knowledge and understanding of Target Heart Rate Range (THRR) Yes       Intervention Provide education and explanation of THRR including how the numbers were predicted and where they are located for reference       Expected Outcomes Short Term: Able to state/look up THRR;Short Term: Able to use daily as guideline for intensity in rehab;Long Term: Able to use THRR to govern intensity when exercising independently       Understanding of Exercise Prescription Yes       Intervention Provide education, explanation, and written materials on patient's individual exercise prescription       Expected Outcomes Short Term: Able to explain program exercise prescription;Long Term: Able to explain home exercise prescription to exercise independently                Exercise Goals Re-Evaluation:  Exercise Goals Re-Evaluation     Row Name 11/22/23 1132 12/19/23 0744 01/15/24 0724 01/31/24 0949 02/09/24 1100     Exercise Goal Re-Evaluation   Exercise Goals Review Increase Physical Activity;Increase Strength and Stamina;Able to understand and use rate of perceived exertion (RPE) scale Increase Physical Activity;Increase Strength and Stamina;Able to understand and use rate of  perceived exertion (RPE) scale Increase Physical Activity;Increase Strength and Stamina;Able to understand and use rate of perceived exertion  (RPE) scale Increase Physical Activity;Increase Strength and Stamina;Able to understand and use rate of perceived exertion (RPE) scale Increase Physical Activity;Increase Strength and Stamina;Able to understand and use rate of perceived exertion (RPE) scale;Knowledge and understanding of Target Heart Rate Range (THRR);Understanding of Exercise Prescription   Comments Patient able to understand and use RPE scale appropriately. Patient is out sick. Unable to review goals at this time. She is making gradual progress with exericse. Diana [name] had sternal wound drained and sternal wire removed on 12/29/23 and still has wound VAC in place. 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 Diana [name] returned to exercise today and tolerated low intensity exercise well without symptoms. Her goal is to get back to where she was prior to her hospitalization. She still has balance concerns. She walks with a can for stability. Warm-up and cool-down stretches performed seated. Reviewed exercise prescription with Diana [name]. She is doing little short durations 2 days/week. Discussed increasing duration, and she is amenable. She has noticed an improvement in her ability to go up and down stairs. Her goal is to increase endurance.   Expected Outcomes Progress duration to achieve 30 minutes of exercise. Review goal upon return to cardiac rehab. Review goal upon return to cardiac rehab. Progress workloads as tolerated to help increase strength and stamina. Kei will increase walking duration 1-2 minutes each session with a goal of achieving 30 minutes walking, 2 days/week in addition to exercise at cardiac rehab to increase her enurance.    Row Name 02/26/24 1134 03/01/24 1129           Exercise Goal Re-Evaluation   Exercise Goals Review Increase Physical Activity;Increase Strength and Stamina;Able to understand and use rate of perceived exertion (RPE) scale;Knowledge and  understanding of Target Heart Rate Range (THRR);Understanding of Exercise Prescription Increase Physical Activity;Increase Strength and Stamina;Able to understand and use rate of perceived exertion (RPE) scale;Knowledge and understanding of Target Heart Rate Range (THRR);Understanding of Exercise Prescription      Comments Diana [name] is making gradual progress with exercise. Her functional capacity increased 46% as measured by . She feels she's benefitted from participation in the program. She will complete cardiac rehab on Friday. Diana [name] completed the cardiac rehab program today and is pleased with her progress. She plans to continue walking at home as her mode of exercise.      Expected Outcomes Continue to progess workloads as tolerated. Diana [name] will walk 30 minutes 3-5 days/week to maintain health and fitness gains.               Nutrition & Weight - Outcomes:  Pre Biometrics - 11/16/23 1130       Pre Biometrics   Waist Circumference 46 inches    Hip Circumference 46 inches    Waist to Hip Ratio 1 %    Triceps Skinfold 37 mm    % Body Fat 47.3 %    Grip Strength 20 kg    Flexibility 0 in   not done, low back pain   Single Leg Stand 1.87 seconds             Post Biometrics - 03/01/24 0939        Post  Biometrics   Height 5\' 4"  (1.626 m)    Waist Circumference 45.25 inches    Hip Circumference 45 inches  Waist to Hip Ratio 1.01 %    BMI (Calculated) 32.07    Triceps Skinfold 32 mm    % Body Fat 46.3 %    Grip Strength 19 kg    Flexibility --   not done, low back pain   Single Leg Stand 2.57 seconds             Nutrition:  Nutrition Therapy & Goals - 03/01/24 0948       Nutrition Therapy   Diet Heart Healthy/Carbohydrate Consistent Diet      Personal Nutrition Goals   Nutrition Goal Patient to identify strategies for reducing cardiovascular risk by attending the [facility] education and nutrition series weekly.   goal met.   Personal Goal #2 Patient to  improve diet quality by using the plate method as a guide for meal planning to include lean protein/plant protein, fruits, vegetables, whole grains, nonfat dairy as part of a well-balanced diet.   goal in action.   Comments Goals in action. Diana [name] has medical history of DM2, hyperlipidemia, CABGx4, chronic systolic heart failure, HTN, s/p mitral valve repair. She has attended the [facility] education/nutrition series regularly. A1c and LDL are at goal. She continues regular follow-up with endocrinology and advanced heart failure clinic. Her daughter does the majority of cooking/grocery shopping and has adopted many heart healthy habits including reduced sodium, increased dietary fiber, etc. Patient will continue to benefit from adherence to nutrition, exercise, and lifestyle modification.      Intervention Plan   Intervention Prescribe, educate and counsel regarding individualized specific dietary modifications aiming towards targeted core components such as weight, hypertension, lipid management, diabetes, heart failure and other comorbidities.;Nutrition handout(s) given to patient.    Expected Outcomes Short Term Goal: Understand basic principles of dietary content, such as calories, fat, sodium, cholesterol and nutrients.;Long Term Goal: Adherence to prescribed nutrition plan.             Nutrition Discharge:  Nutrition Assessments - 03/04/24 0950       Rate Your Plate Scores   Post Score 71             Education Questionnaire Score:  Knowledge Questionnaire Score - 03/01/24 0947       Knowledge Questionnaire Score   Pre Score 18/24    Post Score 23/24             Goals reviewed with patient; copy given to patient.Pt graduates from  Intensive/Traditional cardiac rehab program on 03/01/24  with completion of  20 exercise and  20 education sessions. Pt maintained good attendance and progressed nicely during his participation in rehab as evidenced by increased MET level. Diana [name]  was absent due to having a sternal wound debridement and VAC application.  Diana [name] increased her distance on her post exercise walk test by 332 feet.   Medication list reconciled. Repeat  PHQ score- 0 .  Pt has made significant lifestyle changes and should be commended for their success. Diana [name] achieved her goals during cardiac rehab.   Pt plans to continue exercise at home walking. We are proud of Laylee's progress. Kida said that cardiac rehab has been very helpful.Monte Antonio RN BSN

## 2024-03-12 ENCOUNTER — Ambulatory Visit
Payer: Self-pay | Attending: Thoracic Surgery (Cardiothoracic Vascular Surgery) | Admitting: Thoracic Surgery (Cardiothoracic Vascular Surgery)

## 2024-03-12 VITALS — BP 111/69 | HR 104 | Resp 20 | Ht 64.0 in | Wt 186.0 lb

## 2024-03-12 DIAGNOSIS — T81328D Disruption or dehiscence of closure of other specified internal operation (surgical) wound, subsequent encounter: Secondary | ICD-10-CM

## 2024-03-12 DIAGNOSIS — Z09 Encounter for follow-up examination after completed treatment for conditions other than malignant neoplasm: Secondary | ICD-10-CM

## 2024-03-12 DIAGNOSIS — Z951 Presence of aortocoronary bypass graft: Secondary | ICD-10-CM

## 2024-03-12 NOTE — Progress Notes (Signed)
 [address]       [place] [address]             [phone]     HPI: Mrs. Sidman returns for follow-up of her sternal wound.  Dmya Long is a 78 year old woman with a history of coronary disease, severe mitral regurgitation, hypertension tension, heart failure, pulmonary hypertension, hyperlipidemia, type 2 diabetes, obesity.  She had CABG and mitral valve repair in November 2024.  Developed some swelling and drainage at the inferior aspect of her sternal wound.  She had 2 lower sternal wires that had pulled through.  Initially treated with incision and drainage and local wound care but it recurred so she underwent operative I&D and sternal wire removal and VAC placement on 12/29/2023.  I last saw her in the office on 01/30/2024.  She was doing well at that time.  There was no space for the [facility] so she was changed to saline wet-to-dry's.  The wound is now completely healed.  She does not have any significant pain but she does itch a lot at that area.  Completed cardiac rehab.  Saw Dr. Bensimhon a couple of weeks ago.  Past Medical History:  Diagnosis Date   Arthritis    CAD (coronary artery disease) 7/12-8/12   staged LAD/RCA DES   CHF (congestive heart failure) (HCC)    COPD (chronic obstructive pulmonary disease) (HCC)    Diabetes mellitus (HCC)    Diastolic dysfunction 12/15/2012   grade 1    Dyslipidemia    Dysrhythmia    A. fib post CABG   Heart murmur    Mitral Valve repair November 2024   HTN (hypertension)    Pneumonia     Current Outpatient Medications  Medication Sig Dispense Refill   acetaminophen  (TYLENOL ) 325 MG tablet Take 2 tablets (650 mg total) by mouth every 6 (six) hours as needed for mild pain (pain score 1-3).     albuterol (VENTOLIN HFA) 108 (90 Base) MCG/ACT inhaler Inhale 1-2 puffs into the lungs every 6 (six) hours as needed for shortness of breath or wheezing.     apixaban  (ELIQUIS ) 5 MG TABS tablet Take 1 tablet (5 mg total)  by mouth 2 (two) times daily. 60 tablet 5   aspirin  EC 81 MG tablet Take 1 tablet (81 mg total) by mouth daily. Swallow whole.     BD INSULIN  SYRINGE U/F 1/2UNIT 31G X 5/16" 0.3 ML MISC      Beta Carotene (VITAMIN A) 25000 UNIT capsule Take 25,000 Units by mouth daily.     carvedilol  (COREG ) 3.125 MG tablet TAKE 1 TABLET BY MOUTH 2 TIMES DAILY. 180 tablet 1   cyanocobalamin (VITAMIN B12) 1000 MCG tablet Take 2,000 mcg by mouth daily.     dapagliflozin  propanediol (FARXIGA ) 10 MG TABS tablet Take 1 tablet (10 mg total) by mouth daily. 30 tablet 5   ezetimibe  (ZETIA ) 10 MG tablet Take 1 tablet (10 mg total) by mouth daily. 90 tablet 2   furosemide  (LASIX ) 40 MG tablet Take 1 tablet (40 mg total) by mouth daily. 30 tablet 3   gabapentin  (NEURONTIN ) 100 MG capsule Take 100 mg by mouth 2 (two) times daily.     insulin  NPH-regular Human (NOVOLIN 70/30) (70-30) 100 UNIT/ML injection Inject 30-60 Units into the skin 2 (two) times daily.     loratadine  (CLARITIN ) 10 MG tablet Take 10 mg by mouth daily.     ondansetron  (ZOFRAN -ODT) 8 MG disintegrating tablet  8 mg every 8 (eight) hours as needed for nausea.     Oyster Shell (OYSTER CALCIUM ) 500 MG TABS tablet Take 500 mg of elemental calcium  by mouth 2 (two) times a week.     pantoprazole  (PROTONIX ) 40 MG tablet Take 40 mg by mouth daily.     sacubitril -valsartan  (ENTRESTO ) 24-26 MG Take 1 tablet by mouth 2 (two) times daily. 60 tablet 6   Semaglutide ,0.25 or 0.5MG /DOS, (OZEMPIC , 0.25 OR 0.5 MG/DOSE,) 2 MG/1.5ML SOPN Inject 0.25 mg into the skin once a week.     spironolactone  (ALDACTONE ) 25 MG tablet Take 1 tablet (25 mg total) by mouth daily. 30 tablet 5   traMADol  (ULTRAM ) 50 MG tablet Take 1-2 tablets (50-100 mg total) by mouth every 6 (six) hours as needed for moderate pain (pain score 4-6) or severe pain (pain score 7-10). 30 tablet 0   No current facility-administered medications for this visit.    Physical Exam BP 111/69   Pulse (!) 104   Resp  20   Ht 5\' 4"  (1.626 m)   Wt 186 lb (84.4 kg)   SpO2 99% Comment: RA  BMI 31.74 kg/m  78 year old woman in no acute distress Alert and oriented x 3 with no focal deficits Cardiac regular rate and rhythm Sternal incision well-healed with mild keloid formation at previous open site Lungs clear bilaterally No peripheral edema   Impression: Jazzlin Clements is a 78 year old woman with a history of CAD, severe MR, pulmonary hypertension, heart failure, hypertension, hyperlipidemia, type 2 diabetes, and obesity. A coronary bypass grafting and mitral valve repair on 09/06/2023.  Presented back with dehiscence of the lower portion of her sternal wound and required incision and drainage and VAC placement in late March.  Wound dehiscence-status post incision and drainage, sternal wire removal and VAC placement.  Wound is now healed.  Itching but no significant pain.  Recommended she try putting Mederma or hydrocortisone  cream on the wound.   Plan: Follow-up with Dr. Bensimhon I will be happy to see her back anytime in the future if I can be of any further assistance with her care  Zelphia Higashi, MD [facility] [phone]

## 2024-03-21 ENCOUNTER — Other Ambulatory Visit: Payer: Self-pay | Admitting: Nurse Practitioner

## 2024-03-21 DIAGNOSIS — Z1231 Encounter for screening mammogram for malignant neoplasm of breast: Secondary | ICD-10-CM

## 2024-03-27 ENCOUNTER — Ambulatory Visit
Admission: RE | Admit: 2024-03-27 | Discharge: 2024-03-27 | Disposition: A | Discharge status: Home / Self Care | Source: Ambulatory Visit | Attending: Nurse Practitioner | Admitting: Nurse Practitioner

## 2024-03-27 DIAGNOSIS — Z1231 Encounter for screening mammogram for malignant neoplasm of breast: Secondary | ICD-10-CM

## 2024-03-31 ENCOUNTER — Other Ambulatory Visit (HOSPITAL_COMMUNITY): Payer: Self-pay | Admitting: Adult Health

## 2024-03-31 DIAGNOSIS — I5043 Acute on chronic combined systolic (congestive) and diastolic (congestive) heart failure: Secondary | ICD-10-CM

## 2024-04-16 ENCOUNTER — Other Ambulatory Visit (HOSPITAL_BASED_OUTPATIENT_CLINIC_OR_DEPARTMENT_OTHER): Payer: Self-pay

## 2024-04-17 ENCOUNTER — Other Ambulatory Visit (HOSPITAL_BASED_OUTPATIENT_CLINIC_OR_DEPARTMENT_OTHER): Payer: Self-pay

## 2024-04-17 MED ORDER — OZEMPIC (0.25 OR 0.5 MG/DOSE) 2 MG/3ML ~~LOC~~ SOPN
PEN_INJECTOR | SUBCUTANEOUS | 5 refills | Status: DC
  Filled 2024-04-17: qty 3, 28d supply, fill #0
  Filled 2024-05-15: qty 3, 42d supply, fill #1
  Filled 2024-07-22: qty 3, 28d supply, fill #2
  Filled 2024-09-02: qty 3, 28d supply, fill #3

## 2024-04-18 ENCOUNTER — Other Ambulatory Visit (HOSPITAL_BASED_OUTPATIENT_CLINIC_OR_DEPARTMENT_OTHER): Payer: Self-pay

## 2024-04-20 ENCOUNTER — Other Ambulatory Visit (HOSPITAL_COMMUNITY): Payer: Self-pay | Admitting: Cardiology

## 2024-04-20 DIAGNOSIS — I5043 Acute on chronic combined systolic (congestive) and diastolic (congestive) heart failure: Secondary | ICD-10-CM

## 2024-05-15 ENCOUNTER — Other Ambulatory Visit (HOSPITAL_BASED_OUTPATIENT_CLINIC_OR_DEPARTMENT_OTHER): Payer: Self-pay

## 2024-05-15 ENCOUNTER — Other Ambulatory Visit (HOSPITAL_COMMUNITY): Payer: Self-pay

## 2024-06-26 ENCOUNTER — Other Ambulatory Visit (HOSPITAL_COMMUNITY): Payer: Self-pay | Admitting: Internal Medicine

## 2024-07-22 ENCOUNTER — Other Ambulatory Visit: Payer: Self-pay

## 2024-07-22 ENCOUNTER — Other Ambulatory Visit (HOSPITAL_BASED_OUTPATIENT_CLINIC_OR_DEPARTMENT_OTHER): Payer: Self-pay

## 2024-07-26 ENCOUNTER — Other Ambulatory Visit (HOSPITAL_COMMUNITY): Payer: Self-pay | Admitting: Family Medicine

## 2024-08-07 ENCOUNTER — Other Ambulatory Visit (HOSPITAL_COMMUNITY): Payer: Self-pay | Admitting: Family Medicine

## 2024-09-02 ENCOUNTER — Other Ambulatory Visit (HOSPITAL_COMMUNITY): Payer: Self-pay | Admitting: Family Medicine

## 2024-09-02 ENCOUNTER — Other Ambulatory Visit (HOSPITAL_BASED_OUTPATIENT_CLINIC_OR_DEPARTMENT_OTHER): Payer: Self-pay

## 2024-09-03 ENCOUNTER — Telehealth (HOSPITAL_COMMUNITY): Payer: Self-pay

## 2024-09-03 NOTE — Progress Notes (Signed)
 Advanced Heart Failure Clinic Note   PCP: Barbra Odor, NP PCP-Cardiologist: Debby Sor, MD (Inactive)  AHFC: Dr. Cherrie   HPI: 78 y.o. female w/ h/o CAD s/p remote LAD and RCA PCI in 2011, Type 2DM and HLD, recently diagnosed w/ new systolic heart failure and severe MR by echo 11/24. Referred for outpatient [facility] which showed severe MVCAD, severe ischemic MR, elevated biventricular filling pressures and low output. Severe MR also confirmed by TEE. AHF team  was consulted for HF optimization prior to CABG + MVR. She was optimized w/ milrinone  and diuresed w/ IV Lasix . Underwent CABG x4 (LIMA to LAD, SVG to diagonal, SVG to OM, SVG to acute marginal) + MVR by Dr. Kerrin. Weaned off pressors and transitioned to oral GDMT. Also developed PAF post op and placed on amio. Discharged home on 12/5.   Developed sternal wound infection.  She underwent incision and drainage and sternal wire removal and VAC placement on 12/29/2023. Site has healed well.   Echo 5/25 EF 30-35% MVR ok RV ok   Zio placed 5/25 to assess for afib. Monitor showed rare PACs and PVCs. No AF.   She returns today for heart failure follow up. Feeling good. Getting around more. No significant dyspnea w/ basic ADLs but SOB ambulating stairs/inclines. NYHA Class II. Denies CP.  Denies palpitations. Compliant w/ medications. BP well controlled, 118/60.   EKG today shows NSR 99 bpm.    Past Medical History:  Diagnosis Date   Arthritis    CAD (coronary artery disease) 7/12-8/12   staged LAD/RCA DES   CHF (congestive heart failure) (HCC)    COPD (chronic obstructive pulmonary disease) (HCC)    Diabetes mellitus (HCC)    Diastolic dysfunction 12/15/2012   grade 1    Dyslipidemia    Dysrhythmia    A. fib post CABG   Heart murmur    Mitral Valve repair November 2024   HTN (hypertension)    Pneumonia     Current Outpatient Medications  Medication Sig Dispense Refill   acetaminophen  (TYLENOL ) 325 MG tablet Take  2 tablets (650 mg total) by mouth every 6 (six) hours as needed for mild pain (pain score 1-3).     albuterol (VENTOLIN HFA) 108 (90 Base) MCG/ACT inhaler Inhale 1-2 puffs into the lungs every 6 (six) hours as needed for shortness of breath or wheezing.     apixaban  (ELIQUIS ) 5 MG TABS tablet Take 1 tablet (5 mg total) by mouth 2 (two) times daily. 60 tablet 5   aspirin  EC 81 MG tablet Take 1 tablet (81 mg total) by mouth daily. Swallow whole.     BD INSULIN  SYRINGE U/F 1/2UNIT 31G X 5/16 0.3 ML MISC      carvedilol  (COREG ) 3.125 MG tablet Take 1 tablet (3.125 mg total) by mouth 2 (two) times daily. 180 tablet 1   cyanocobalamin (VITAMIN B12) 1000 MCG tablet Take 2,000 mcg by mouth daily.     dapagliflozin  propanediol (FARXIGA ) 10 MG TABS tablet Take 1 tablet (10 mg total) by mouth daily. 30 tablet 5   ENTRESTO  24-26 MG TAKE 1 TABLET BY MOUTH TWICE A DAY 60 tablet 6   ezetimibe  (ZETIA ) 10 MG tablet TAKE 1 TABLET BY MOUTH EVERY DAY 90 tablet 2   furosemide  (LASIX ) 40 MG tablet Take 1 tablet (40 mg total) by mouth daily. 90 tablet 3   gabapentin  (NEURONTIN ) 100 MG capsule Take 100 mg by mouth 2 (two) times daily.     insulin  NPH-regular  Human (NOVOLIN 70/30) (70-30) 100 UNIT/ML injection Inject 30-60 Units into the skin 2 (two) times daily.     loratadine  (CLARITIN ) 10 MG tablet Take 10 mg by mouth daily.     ondansetron  (ZOFRAN -ODT) 8 MG disintegrating tablet 8 mg every 8 (eight) hours as needed for nausea.     Oyster Shell (OYSTER CALCIUM ) 500 MG TABS tablet Take 500 mg of elemental calcium  by mouth 2 (two) times a week.     pantoprazole  (PROTONIX ) 40 MG tablet Take 40 mg by mouth daily.     Semaglutide ,0.25 or 0.5MG /DOS, (OZEMPIC , 0.25 OR 0.5 MG/DOSE,) 2 MG/1.5ML SOPN Inject 0.25 mg into the skin once a week.     spironolactone  (ALDACTONE ) 25 MG tablet TAKE 1 TABLET (25 MG TOTAL) BY MOUTH DAILY. 90 tablet 1   traMADol  (ULTRAM ) 50 MG tablet Take 1-2 tablets (50-100 mg total) by mouth every 6  (six) hours as needed for moderate pain (pain score 4-6) or severe pain (pain score 7-10). 30 tablet 0   VITAMIN A PO Take 2,400 Units by mouth.     No current facility-administered medications for this encounter.    Allergies  Allergen Reactions   Keflex [Cephalexin] Hives   Lisinopril Cough   Sulfa Antibiotics Hives      Social History   Socioeconomic History   Marital status: Married    Spouse name: Not on file   Number of children: Not on file   Years of education: Not on file   Highest education level: Not on file  Occupational History   Not on file  Tobacco Use   Smoking status: Never    Passive exposure: Never   Smokeless tobacco: Never  Vaping Use   Vaping status: Never Used  Substance and Sexual Activity   Alcohol use: Yes    Comment: Very Rare   Drug use: Never   Sexual activity: Not Currently    Partners: Male  Other Topics Concern   Not on file  Social History Narrative   Not on file   Social Drivers of Health   Financial Resource Strain: Not on file  Food Insecurity: Low Risk  (02/06/2024)   Received from [facility]   Hunger Vital Sign    Within the past 12 months, you worried that your food would run out before you got money to buy more: Never true    Within the past 12 months, the food you bought just didn't last and you didn't have money to get more. : Never true  Transportation Needs: No Transportation Needs (02/06/2024)   Received from [facility]    In the past 12 months, has lack of reliable transportation kept you from medical appointments, meetings, work or from getting things needed for daily living? : No  Physical Activity: Not on file  Stress: Not on file  Social Connections: Moderately Integrated (12/30/2023)   Social Connection and Isolation Panel    Frequency of Communication with Friends and Family: More than three times a week    Frequency of Social Gatherings with Friends and Family: More than three times a  week    Attends Religious Services: 1 to 4 times per year    Active Member of [employer] or Organizations: No    Attends Banker Meetings: Never    Marital Status: Married  Catering Manager Violence: Not At Risk (12/30/2023)   Humiliation, Afraid, Rape, and Kick questionnaire    Fear of Current or Ex-Partner: No  Emotionally Abused: No    Physically Abused: No    Sexually Abused: No      Family History  Problem Relation Age of Onset   CAD Neg Hx    Vitals:   09/04/24 0812  BP: 118/60  Pulse: 96  SpO2: 95%  Weight: 90.1 kg (198 lb 9.6 oz)  Height: 5' 4 (1.626 m)    Filed Weights   09/04/24 0812  Weight: 90.1 kg (198 lb 9.6 oz)   Wt Readings from Last 3 Encounters:  09/04/24 90.1 kg (198 lb 9.6 oz)  03/12/24 84.4 kg (186 lb)  03/01/24 84.8 kg (186 lb 15.2 oz)      PHYSICAL EXAM: General: well appearing. No distress  Cardiac: JVP not elevated. No murmurs  Resp: Lung sounds clear and equal B/L Abdomen: Soft, non-distended.  Extremities: Warm and dry.  no edema.  Neuro: A&O x3. Affect pleasant.   ASSESSMENT & PLAN:  1. Chronic Systolic Heart Failure  - Echo 2014 w/ normal EF 50-55%, normal RV - Echo 11/24 EF 25-30%, severe MR, RV low normal  - Ischemic CM. LHC w/ severe MVCAD.  - RHC 11/24 c/w decompensated heart failure, severe LV dysfunction, and severe mitral regurgitation (RA 21, PAP 83/37/56, PCWP 36, CO 4.11, CI 2.04) - TEE: EF 25%, RV severely reduced, severe LAE, 3-4+ MR - S/p CABG X 4 (LIMA to LAD, SVG to diagonal, SVG to OM, SVG to acute marginal) + MVRepair on 11/20 - Post Op Echo 12/24: EF 30-35%, s/p MV repair with trivial MR and mean gradient 5 mmHg.  - Echo 5/25: EF 30-35% MVR stable. Inferior wall AK. RV ok  - Stable NYHA Class II. Euvolemic on exam  - Continue Lasix  to 40 mg daily - Increase Entresto  to 49-51 mg bid  - Continue Farxiga  10 mg daily - Continue spironolactone  25 mg  - Continue Carvedilol  3.125 bid  - Check BMP and BNP   - Will continue to titrate up meds and repeat echo in 2-3 months. If EF remains < 35% consider ICD   2. H/o Severe MR s/p MVRepair 11/24 - appeared ischemic w/ posterior leaflet tethering on echo 11/24. TEE 3-4+ MR - Now s/p MVR 11/20 at time of CABG 11/24 - MVR stable on echo 5/25 - discussed need for SBE prophylaxis   3. Severe MVCAD  - S/p CABG X 4 (LIMA to LAD, SVG to diagonal, SVG to OM, SVG to acute marginal) on 11/20 - stable w/o CP  - Continue ASA 81 mg daily  - Continue Crestor  40 mg daily   4. Post Op PAF/Atrial Tach  - in NSR on EKG today   - now off amio - Zio 5/25 no AF - will stop Eliquis    5. Type 2DM  - Hgb A1c 6.4% - Follows with Dr. Elsie Sharps (Endo @ [facility])  6. Sternal Wound - resolved  Follow up in 3-4 wks w/ PharmD for further med titration and 2 months w/ APP and echo.   Caffie Shed, PA-C 09/04/24

## 2024-09-03 NOTE — Telephone Encounter (Signed)
 Called to confirm/remind patient of their appointment at the Advanced Heart Failure Clinic on 09/05/19 8:15.   Appointment:   [x] Confirmed  [] Left mess   [] No answer/No voice mail  [] VM Full/unable to leave message  [] Phone not in service  Patient reminded to bring all medications and/or complete list.  Confirmed patient has transportation. Gave directions, instructed to utilize valet parking.

## 2024-09-04 ENCOUNTER — Other Ambulatory Visit (HOSPITAL_BASED_OUTPATIENT_CLINIC_OR_DEPARTMENT_OTHER): Payer: Self-pay

## 2024-09-04 ENCOUNTER — Encounter (HOSPITAL_COMMUNITY): Payer: Self-pay

## 2024-09-04 ENCOUNTER — Ambulatory Visit (HOSPITAL_COMMUNITY): Payer: Self-pay | Admitting: Cardiology

## 2024-09-04 ENCOUNTER — Ambulatory Visit (HOSPITAL_COMMUNITY)
Admission: RE | Admit: 2024-09-04 | Discharge: 2024-09-04 | Disposition: A | Discharge status: Home / Self Care | Source: Ambulatory Visit | Attending: Cardiology | Admitting: Cardiology

## 2024-09-04 VITALS — BP 118/60 | HR 96 | Ht 64.0 in | Wt 198.6 lb

## 2024-09-04 DIAGNOSIS — Z79899 Other long term (current) drug therapy: Secondary | ICD-10-CM | POA: Insufficient documentation

## 2024-09-04 DIAGNOSIS — E119 Type 2 diabetes mellitus without complications: Secondary | ICD-10-CM | POA: Diagnosis present

## 2024-09-04 DIAGNOSIS — I5022 Chronic systolic (congestive) heart failure: Secondary | ICD-10-CM

## 2024-09-04 DIAGNOSIS — Z7901 Long term (current) use of anticoagulants: Secondary | ICD-10-CM | POA: Diagnosis not present

## 2024-09-04 DIAGNOSIS — Z955 Presence of coronary angioplasty implant and graft: Secondary | ICD-10-CM | POA: Diagnosis present

## 2024-09-04 DIAGNOSIS — I471 Supraventricular tachycardia, unspecified: Secondary | ICD-10-CM | POA: Insufficient documentation

## 2024-09-04 DIAGNOSIS — I2510 Atherosclerotic heart disease of native coronary artery without angina pectoris: Secondary | ICD-10-CM | POA: Diagnosis present

## 2024-09-04 DIAGNOSIS — E785 Hyperlipidemia, unspecified: Secondary | ICD-10-CM | POA: Diagnosis not present

## 2024-09-04 DIAGNOSIS — Z7984 Long term (current) use of oral hypoglycemic drugs: Secondary | ICD-10-CM | POA: Diagnosis not present

## 2024-09-04 DIAGNOSIS — Z794 Long term (current) use of insulin: Secondary | ICD-10-CM | POA: Insufficient documentation

## 2024-09-04 DIAGNOSIS — Z951 Presence of aortocoronary bypass graft: Secondary | ICD-10-CM | POA: Diagnosis not present

## 2024-09-04 DIAGNOSIS — Z7982 Long term (current) use of aspirin: Secondary | ICD-10-CM | POA: Insufficient documentation

## 2024-09-04 LAB — BASIC METABOLIC PANEL WITH GFR
Anion gap: 9 (ref 5–15)
BUN: 34 mg/dL — ABNORMAL HIGH (ref 8–23)
CO2: 26 mmol/L (ref 22–32)
Calcium: 9.2 mg/dL (ref 8.9–10.3)
Chloride: 101 mmol/L (ref 98–111)
Creatinine, Ser: 1.21 mg/dL — ABNORMAL HIGH (ref 0.44–1.00)
GFR, Estimated: 46 mL/min — ABNORMAL LOW (ref >60)
Glucose, Bld: 144 mg/dL — ABNORMAL HIGH (ref 70–99)
Potassium: 4.7 mmol/L (ref 3.5–5.1)
Sodium: 136 mmol/L (ref 135–145)

## 2024-09-04 LAB — BRAIN NATRIURETIC PEPTIDE: B Natriuretic Peptide: 89.9 pg/mL (ref 0.0–100.0)

## 2024-09-04 MED ORDER — SACUBITRIL-VALSARTAN 49-51 MG PO TABS: 1.0000 | ORAL_TABLET | Freq: Two times a day (BID) | ORAL | 3 refills | Status: AC

## 2024-09-04 NOTE — Patient Instructions (Signed)
 Medication Changes:  STOP ELIQUIS    INCREASE ENTRESTO  TO 49/51MG  TWICE DAILY   Lab Work:  Labs done today, your results will be available in MyChart, we will contact you for abnormal readings.  Follow-Up in: 4 WEEKS AS SCHEDULED WITH PHARMACY CLINIC   2-3 MONTHS AS SCHEDULED WITH ECHOCARDIOGRAM   At the Advanced Heart Failure Clinic, you and your health needs are our priority. We have a designated team specialized in the treatment of Heart Failure. This Care Team includes your primary Heart Failure Specialized Cardiologist (physician), Advanced Practice Providers (APPs- Physician Assistants and Nurse Practitioners), and Pharmacist who all work together to provide you with the care you need, when you need it.   You may see any of the following providers on your designated Care Team at your next follow up:  Dr. Toribio Fuel Dr. Ezra Shuck Dr. Odis Brownie Greig Mosses, NP Caffie Shed, [place] [facility], [place] Beckey Coe, NP Jordan Lee, NP Tinnie Redman, PharmD   Please be sure to bring in all your medications bottles to every appointment.   Need to Contact Us :  If you have any questions or concerns before your next appointment please send us  a message through [place] or call our office at [phone].    TO LEAVE A MESSAGE FOR THE NURSE SELECT OPTION 2, PLEASE LEAVE A MESSAGE INCLUDING: YOUR NAME DATE OF BIRTH CALL BACK NUMBER REASON FOR CALL**this is important as we prioritize the call backs  YOU WILL RECEIVE A CALL BACK THE SAME DAY AS LONG AS YOU CALL BEFORE 4:00 PM

## 2024-09-06 ENCOUNTER — Other Ambulatory Visit (HOSPITAL_BASED_OUTPATIENT_CLINIC_OR_DEPARTMENT_OTHER): Payer: Self-pay

## 2024-09-10 ENCOUNTER — Other Ambulatory Visit (HOSPITAL_BASED_OUTPATIENT_CLINIC_OR_DEPARTMENT_OTHER): Payer: Self-pay

## 2024-09-10 MED ORDER — OZEMPIC (0.25 OR 0.5 MG/DOSE) 2 MG/3ML ~~LOC~~ SOPN
PEN_INJECTOR | SUBCUTANEOUS | 5 refills | Status: DC
  Filled 2024-09-10: qty 3, 28d supply, fill #0

## 2024-09-16 ENCOUNTER — Ambulatory Visit (HOSPITAL_COMMUNITY): Payer: Self-pay | Admitting: Cardiology

## 2024-09-16 ENCOUNTER — Ambulatory Visit (HOSPITAL_COMMUNITY)
Admission: RE | Admit: 2024-09-16 | Discharge: 2024-09-16 | Disposition: A | Source: Ambulatory Visit | Attending: Cardiology

## 2024-09-16 DIAGNOSIS — I5022 Chronic systolic (congestive) heart failure: Secondary | ICD-10-CM | POA: Insufficient documentation

## 2024-09-16 LAB — BASIC METABOLIC PANEL WITH GFR
Anion gap: 9 (ref 5–15)
BUN: 28 mg/dL — ABNORMAL HIGH (ref 8–23)
CO2: 28 mmol/L (ref 22–32)
Calcium: 8.8 mg/dL — ABNORMAL LOW (ref 8.9–10.3)
Chloride: 98 mmol/L (ref 98–111)
Creatinine, Ser: 1.13 mg/dL — ABNORMAL HIGH (ref 0.44–1.00)
GFR, Estimated: 50 mL/min — ABNORMAL LOW (ref >60)
Glucose, Bld: 199 mg/dL — ABNORMAL HIGH (ref 70–99)
Potassium: 4.4 mmol/L (ref 3.5–5.1)
Sodium: 135 mmol/L (ref 135–145)

## 2024-10-01 NOTE — Progress Notes (Signed)
 Advanced Heart Failure Clinic Note   PCP: Barbra Odor, NP PCP-Cardiologist: Debby Sor, MD (Inactive)  AHFC: Dr. Cherrie   HPI:  78 y.o. female w/ h/o CAD s/p remote LAD and RCA PCI in 2011, Type 2DM and HLD, recently diagnosed w/ new systolic heart failure and severe MR by echo 11/24. Referred for outpatient [facility] which showed severe MVCAD, severe ischemic MR, elevated biventricular filling pressures and low output. Severe MR also confirmed by TEE. AHF team was consulted for HF optimization prior to CABG + MVR. She was optimized w/ milrinone  and diuresed w/ IV Lasix . Underwent CABG x4 (LIMA to LAD, SVG to diagonal, SVG to OM, SVG to acute marginal) + MVR by Dr. Kerrin. Weaned off pressors and transitioned to oral GDMT. Also developed PAF post op and placed on amio. Discharged home on 12/5.    Developed sternal wound infection.  She underwent incision and drainage and sternal wire removal and VAC placement on 12/29/2023. Site has healed well.    Echo 5/25 EF 30-35% MVR ok RV ok    Zio placed 5/25 to assess for afib. Monitor showed rare PACs and PVCs. No AF.   Today she returns to HF clinic for pharmacist medication titration. At last visit with MD on 11/19, she was feeling good and was no longer experiencing dyspnea with basic ADLs. Entresto  was increased to 49/51 mg BID. Labs stable 2 weeks later. She is feeling well. Denies shortness of breath, orthopnea, and PND. Minimal LE edema on exam. She takes lasix  40 mg daily. Denies lightheadedness or dizziness. Was checking weights daily at home but stopped about 1 month ago because she got out of the habit of checking. She said her weight has been creeping up recently. Her BP at home is usually 120/70s. She is following a low sodium diet but has been drinking >64 oz of fluid daily because she was under the impression that she needed to drink a lot. Activity level is good. Walks about 4,000 steps daily. Appetite is good.   HF  Medications: Carvedilol  3.125 mg BID Entresto  49/51 mg BID Spironolactone  25 mg daily Farxiga  10 mg daily Furosemide  40 mg daily  Has the patient been experiencing any side effects to the medications prescribed?  no  Does the patient have any problems obtaining medications due to transportation or finances?   Yes - has healthwell grant - last note states approved through 08/18/24 - message sent to pt advocate to see if we need to re-enroll   Understanding of regimen: good Understanding of indications: good Potential of compliance: good Patient understands to avoid NSAIDs. Patient understands to avoid decongestants.    Pertinent Lab Values: 12/1: Serum creatinine 1.13, BUN 28, Potassium 4.4, Sodium 135  Vital Signs: Weight: 201 lbs (last clinic weight: 198 lbs) Blood pressure: 110/78 mmHg  Heart rate: 93 bpm   Assessment/Plan: 1. Chronic Systolic Heart Failure  - Echo 2014 w/ normal EF 50-55%, normal RV - Echo 11/24 EF 25-30%, severe MR, RV low normal  - Ischemic CM. LHC w/ severe MVCAD.  - RHC 11/24 c/w decompensated heart failure, severe LV dysfunction, and severe mitral regurgitation (RA 21, PAP 83/37/56, PCWP 36, CO 4.11, CI 2.04) - TEE: EF 25%, RV severely reduced, severe LAE, 3-4+ MR - S/p CABG X 4 (LIMA to LAD, SVG to diagonal, SVG to OM, SVG to acute marginal) + MVRepair on 11/20 - Post Op Echo 12/24: EF 30-35%, s/p MV repair with trivial MR and mean gradient  5 mmHg.  - Echo 5/25: EF 30-35% MVR stable. Inferior wall AK. RV ok  - Stable NYHA Class II.  - Increase carvedilol  to 6.25 mg BID. States she was on carvedilol  12.5 mg BID years ago. - Continue Entresto  49/51 mg BID - Continue spironolactone  25 mg daily - Continue Farxiga  10 mg daily - Continue furosemide  40 mg daily - Educated on limiting fluid intake - Will continue to titrate up meds and repeat echo in 2 months. If EF remains < 35% consider ICD   2. H/o Severe MR s/p MVRepair 11/24 - appeared ischemic w/  posterior leaflet tethering on echo 11/24. TEE 3-4+ MR - Now s/p MVR 11/20 at time of CABG 11/24 - MVR stable on echo 5/25   3. Severe MVCAD  - S/p CABG X 4 (LIMA to LAD, SVG to diagonal, SVG to OM, SVG to acute marginal) on 11/20 - stable w/o CP  - Continue ASA 81 mg daily  - Continue Crestor  40 mg daily    4. Post Op PAF/Atrial Tach  - Zio 5/25 no AF - now off amio and Eliquis    5. Type 2DM  - Hgb A1c 6.4% - Follows with Dr. Elsie Sharps (Endo @ [facility])   6. Sternal Wound - resolved  Follow up in 1 month with PharmD  Duwaine Plant, PharmD, BCPS Heart Failure Clinic Pharmacist [phone]

## 2024-10-02 ENCOUNTER — Ambulatory Visit (HOSPITAL_COMMUNITY): Admission: RE | Admit: 2024-10-02 | Discharge: 2024-10-02 | Attending: Cardiology

## 2024-10-02 VITALS — BP 110/78 | HR 93 | Wt 201.4 lb

## 2024-10-02 DIAGNOSIS — I5022 Chronic systolic (congestive) heart failure: Secondary | ICD-10-CM

## 2024-10-02 MED ORDER — CARVEDILOL 6.25 MG PO TABS: 6.2500 mg | ORAL_TABLET | Freq: Two times a day (BID) | ORAL | 3 refills | Status: DC

## 2024-10-02 NOTE — Patient Instructions (Signed)
 It was a pleasure seeing you today!  MEDICATIONS: -We are changing your medications today -Increase carvedilol  to 6.25 mg twice daily -Call if you have questions about your medications.  NEXT APPOINTMENT: Return to clinic in 1 month with pharmacy.  In general, to take care of your heart failure: -Limit your fluid intake to 2 Liters (half-gallon) per day.   -Limit your salt intake to ideally 2-3 grams (2000-3000 mg) per day. -Weigh yourself daily and record, and bring that weight diary to your next appointment.  (Weight gain of 2-3 pounds in 1 day typically means fluid weight.) -The medications for your heart are to help your heart and help you live longer.   -Please contact us  before stopping any of your heart medications.  Call the clinic at [phone] with questions or to reschedule future appointments.

## 2024-10-06 ENCOUNTER — Other Ambulatory Visit (HOSPITAL_COMMUNITY): Payer: Self-pay | Admitting: Internal Medicine

## 2024-10-21 ENCOUNTER — Telehealth (HOSPITAL_COMMUNITY): Payer: Self-pay

## 2024-10-21 ENCOUNTER — Other Ambulatory Visit (HOSPITAL_COMMUNITY): Payer: Self-pay

## 2024-10-21 NOTE — Telephone Encounter (Signed)
 Advanced Heart Failure Patient Advocate Encounter  The patient was approved for a Health[employer] grant that will help cover the cost of Carvedilol , Entresto , Farxiga , Spironolactone .  Total amount awarded, $7,500.  Effective: 09/21/2024 - 09/20/2025.  BIN [id number] PCN [id number] [id number] ID [id number]  Patient informed via phone and [employer]PS.  Rachel DEL, CPhT Rx Patient Advocate Phone: [phone]

## 2024-10-25 NOTE — Progress Notes (Signed)
 "    Advanced Heart Failure Clinic Note   PCP: Barbra Odor, NP PCP-Cardiologist: Debby Sor, MD (Inactive)  AHFC: Dr. Cherrie   HPI:  79 y.o. female w/ h/o CAD s/p remote LAD and RCA PCI in 2011, Type 2DM and HLD, recently diagnosed w/ new systolic heart failure and severe MR by echo 08/2023. 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 Severe MR also confirmed by TEE. AHF team was consulted for HF optimization prior to CABG + MVR. She was optimized w/ milrinone  and diuresed w/ IV Lasix . Underwent CABG x4 (LIMA to LAD, SVG to diagonal, SVG to OM, SVG to acute marginal) + MVR by Dr. Kerrin. Weaned off pressors and transitioned to oral GDMT. Also developed PAF post op and placed on amio. Discharged home on 09/21/23.    Developed sternal wound infection.  She underwent incision and drainage and sternal wire removal and VAC placement on 12/29/2023. Site healed well.    Echo 02/2024 EF 30-35% MVR ok RV ok    Zio placed 02/2024 to assess for afib. Monitor showed rare PACs and PVCs. No AF.   Presented to pharmacy clinic 10/02/24. Denied shortness of breath, orthopnea, and PND. Noted minimal LE edema on exam.  Denied lightheadedness or dizziness. Had been checking weights daily at home but stopped about 1 month ago because she got out of the habit of checking. She stated her weight had been creeping up recently. Her BP at home was usually 120/70s. She was following a low sodium diet but had been drinking >64 oz of fluid daily because she was under the impression that she needed to drink a lot. Activity level was good. Noted she walks about 4,000 steps daily. Appetite was good.   Today she returns to HF clinic for pharmacist medication titration. At recent visits to clinic Entresto  was increased to 49/51 mg BID and carvedilol  was increased to 6.25 mg BID. Overall she is feeling well today. No dizziness,  lightheadedness, CP or palpations. No SOB/DOE on flat ground. Going up the stairs into her house can be tiring so she just takes her time. Has been trying to walk more for exercise. Does not check her weight consistently at home. Limits herself to 2L of fluid per day. Has trace bilateral LEE, which she states is because she has not yet taken her Lasix  today. No PND/orthopnea. BP at home typically 118/70. BP in clinic today 134/64 but she had not taken her morning medications yet. Has visit with PCP tomorrow, has been coughing things up and feels like she might have a cold. Also thinks she has a UTI, notes increased frequency and burning with urination that started a week ago.     HF Medications: Carvedilol  6.25 mg BID Entresto  49/51 mg BID Spironolactone  25 mg daily Farxiga  10 mg daily Furosemide  40 mg daily  Has the patient been experiencing any side effects to the medications prescribed?  no  Does the patient have any problems obtaining medications due to transportation or finances?   [employer] Medicare insurance and [employer]. [facility] did not apply grant the last time they filled Entresto . She will let us  know if there is any difficulty in processing the grant at her next fill.   Understanding of regimen: good Understanding of indications: good Potential of compliance: good Patient understands to avoid NSAIDs. Patient understands to avoid decongestants.    Pertinent Lab Values: 09/16/24: Serum creatinine 1.13, BUN 28, Potassium  4.4, Sodium 135  Vital Signs:  Weight: 205.2 lbs (last clinic weight: 201 lbs) Blood pressure: 134/64 mmHg  Heart rate: 80 bpm   Assessment/Plan: 1. Chronic Systolic Heart Failure  - Echo 2014 w/ normal EF 50-55%, normal RV - Echo 08/2023 EF 25-30%, severe MR, RV low normal  - Ischemic CM. LHC w/ severe MVCAD.  - RHC 08/2023 c/w decompensated heart failure, severe LV dysfunction, and severe mitral regurgitation (RA 21, PAP 83/37/56, PCWP 36, CO 4.11, CI  2.04) - TEE: EF 25%, RV severely reduced, severe LAE, 3-4+ MR - S/p CABG X 4 (LIMA to LAD, SVG to diagonal, SVG to OM, SVG to acute marginal) + MVRepair on 09/06/23 - Post Op Echo 09/2023: EF 30-35%, s/p MV repair with trivial MR and mean gradient 5 mmHg.  - Echo 02/2024: EF 30-35% MVR stable. Inferior wall AK. RV ok  - Stable NYHA Class II. Volume status mildly elevated, but has not taken her Lasix  yet today. - Continue Lasix  40 mg daily - Increase carvedilol  to 12.5 mg BID - Continue Entresto  49/51 mg BID. Plan to increase next visit if appropriate.  - Continue spironolactone  25 mg daily - Continue Farxiga  10 mg daily. Has some symptoms of a possible UTI that she will get assessed tomorrow. We discussed that Farxiga  usually causes yeast infections but can cause UTIs. Since she does not have frequent UTIs, benefit of Farxiga  outweighs risk in my opinion. She agrees and would like to continue the medication, but can reassess if these increase in frequency.  - Will continue to titrate up meds and repeat echo scheduled for 12/05/24. If EF remains < 35% consider ICD   2. H/o Severe MR s/p MVRepair 08/2023 - appeared ischemic w/ posterior leaflet tethering on echo 08/2023. TEE 3-4+ MR - Now s/p MVR 09/06/23 at time of CABG 08/2023 - MVR stable on echo 02/2024   3. Severe MVCAD  - S/p CABG X 4 (LIMA to LAD, SVG to diagonal, SVG to OM, SVG to acute marginal) on 09/06/23 - stable w/o CP  - Continue ASA 81 mg daily  - Continue Crestor  40 mg daily    4. Post Op PAF/Atrial Tach  - Zio 02/2024 no AF - now off amiodarone  and Eliquis    5. Type 2DM  - Hgb A1c 6.4% - Follows with Dr. Elsie Sharps (Endo @ [place])   6. Sternal Wound - resolved  Follow up with Pharmacy Clinic in two weeks to consider increasing Entresto , then one month with APP/ECHO.  Tinnie Redman, PharmD, BCPS, BCCP, CPP Heart Failure Clinic Pharmacist [phone]    "

## 2024-11-07 ENCOUNTER — Ambulatory Visit (HOSPITAL_COMMUNITY)
Admission: RE | Admit: 2024-11-07 | Discharge: 2024-11-07 | Disposition: A | Discharge status: Home / Self Care | Source: Ambulatory Visit | Attending: Cardiology | Admitting: Cardiology

## 2024-11-07 VITALS — BP 134/64 | HR 80 | Wt 205.2 lb

## 2024-11-07 DIAGNOSIS — Z79899 Other long term (current) drug therapy: Secondary | ICD-10-CM | POA: Insufficient documentation

## 2024-11-07 DIAGNOSIS — I255 Ischemic cardiomyopathy: Secondary | ICD-10-CM | POA: Insufficient documentation

## 2024-11-07 DIAGNOSIS — I340 Nonrheumatic mitral (valve) insufficiency: Secondary | ICD-10-CM | POA: Diagnosis not present

## 2024-11-07 DIAGNOSIS — E119 Type 2 diabetes mellitus without complications: Secondary | ICD-10-CM | POA: Diagnosis not present

## 2024-11-07 DIAGNOSIS — E785 Hyperlipidemia, unspecified: Secondary | ICD-10-CM | POA: Diagnosis not present

## 2024-11-07 DIAGNOSIS — Z951 Presence of aortocoronary bypass graft: Secondary | ICD-10-CM | POA: Insufficient documentation

## 2024-11-07 DIAGNOSIS — I2510 Atherosclerotic heart disease of native coronary artery without angina pectoris: Secondary | ICD-10-CM | POA: Diagnosis not present

## 2024-11-07 DIAGNOSIS — I5022 Chronic systolic (congestive) heart failure: Secondary | ICD-10-CM | POA: Diagnosis present

## 2024-11-07 DIAGNOSIS — Z7984 Long term (current) use of oral hypoglycemic drugs: Secondary | ICD-10-CM | POA: Diagnosis not present

## 2024-11-07 DIAGNOSIS — Z7982 Long term (current) use of aspirin: Secondary | ICD-10-CM | POA: Insufficient documentation

## 2024-11-07 MED ORDER — CARVEDILOL 12.5 MG PO TABS: 12.5000 mg | ORAL_TABLET | Freq: Two times a day (BID) | ORAL | 3 refills | Status: DC

## 2024-11-07 MED ORDER — DAPAGLIFLOZIN PROPANEDIOL 10 MG PO TABS: 10.0000 mg | ORAL_TABLET | Freq: Every day | ORAL | 3 refills | Status: AC

## 2024-11-07 NOTE — Patient Instructions (Signed)
 It was a pleasure seeing you today!  MEDICATIONS: -We are changing your medications today -Increase carvedilol  to 12.5 mg (1 tablet) twice daily -Call if you have questions about your medications.   NEXT APPOINTMENT: Return to clinic in 2 weeks with Pharmacy Clinic.  In general, to take care of your heart failure: -Limit your fluid intake to 2 Liters (half-gallon) per day.   -Limit your salt intake to ideally 2-3 grams (2000-3000 mg) per day. -Weigh yourself daily and record, and bring that weight diary to your next appointment.  (Weight gain of 2-3 pounds in 1 day typically means fluid weight.) -The medications for your heart are to help your heart and help you live longer.   -Please contact us  before stopping any of your heart medications.  Call the clinic at [phone] with questions or to reschedule future appointments.

## 2024-11-20 ENCOUNTER — Other Ambulatory Visit (HOSPITAL_COMMUNITY): Payer: Self-pay | Admitting: Internal Medicine

## 2024-11-20 DIAGNOSIS — I5043 Acute on chronic combined systolic (congestive) and diastolic (congestive) heart failure: Secondary | ICD-10-CM

## 2024-11-20 NOTE — Progress Notes (Incomplete)
 ***In Progress***    Advanced Heart Failure Clinic Note   PCP: Barbra Odor, NP PCP-Cardiologist: Debby Sor, MD (Inactive)  AHFC: Dr. Cherrie   HPI:  79 y.o. female w/ h/o CAD s/p remote LAD and RCA PCI in 2011, Type 2DM and HLD, recently diagnosed w/ new systolic heart failure and severe MR by echo 08/2023. 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 Severe MR also confirmed by TEE. AHF team was consulted for HF optimization prior to CABG + MVR. She was optimized w/ milrinone  and diuresed w/ IV Lasix . Underwent CABG x4 (LIMA to LAD, SVG to diagonal, SVG to OM, SVG to acute marginal) + MVR by Dr. Kerrin. Weaned off pressors and transitioned to oral GDMT. Also developed PAF post op and placed on amio. Discharged home on 09/21/23.    Developed sternal wound infection.  She underwent incision and drainage and sternal wire removal and VAC placement on 12/29/2023. Site healed well.    Echo 02/2024 EF 30-35% MVR ok RV ok.    Zio placed 02/2024 to assess for afib. Monitor showed rare PACs and PVCs. No AF.    Most recently presented to pharmacy clinic on 11/07/24. Denied dizziness, lightheadedness, CP, or palpitations. Denied SOB/DOE on flat ground. Reported tiredness when going up stairs. Patient was trying to walk more for exercise. Was not checking her weight at home consistently. Had trace bilateral LEE, which she stated was because she had not taken her furosemide  yet. Denied PND/orthopnea. BP at home typically 118/70 mmHg. BP in clinic slightly higher but she had not yet taken her morning medications. Had PCP visit the follow-ing day, had been coughing things up and felt like she might have a cold. Also though she has a UTI, noted increased frequency and burning with urination that started a week prior.  Today he returns to HF clinic for pharmacist medication titration. At last visit in pharmacy  clinic, carvedilol  was increased to 12.5 mg BID. ***  Shortness of breath/dyspnea on exertion? {YES NO:22349}  Orthopnea/PND? {YES NO:22349} Edema? {YES NO:22349} Lightheadedness/dizziness? {YES NO:22349} Daily weights at home? {YES NO:22349} Blood pressure/heart rate monitoring at home? {YES [id number] Following low-sodium/fluid-restricted diet? {YES NO:22349}  HF Medications: Carvedilol  12.5 mg BID Entresto  49/51 mg BID Spironolactone  25 mg daily Farxiga  10 mg daily Furosemide  40 mg daily  Has the patient been experiencing any side effects to the medications prescribed?  {YES NO:22349}  Does the patient have any problems obtaining medications due to transportation or finances?   No, patient has [employer]  Understanding of regimen: {excellent/good/fair/poor:19665} Understanding of indications: {excellent/good/fair/poor:19665} Potential of compliance: {excellent/good/fair/poor:19665} Patient understands to avoid NSAIDs. Patient understands to avoid decongestants.    Pertinent Lab Values: (09/16/24) Serum creatinine 1.13 mg/dL, BUN 28 mg/dL, Potassium 4.4 mmol/L, Sodium 135 mmol/L, BNP 89.9 pg/mL  Vital Signs: Weight: *** (last clinic weight: 205.2 lbs) Blood pressure: *** (last clinic BP: 134/64 mmHg) Heart rate: *** (last clinic HR: 80 bpm) ***  Assessment/Plan: 1. Chronic Systolic Heart Failure  - Echo 2014 w/ normal EF 50-55%, normal RV - Echo 08/2023 EF 25-30%, severe MR, RV low normal  - Ischemic CM. LHC w/ severe MVCAD.  - RHC 08/2023 c/w decompensated heart failure, severe LV dysfunction, and severe mitral regurgitation (RA 21, PAP 83/37/56, PCWP 36, CO 4.11, CI 2.04) - S/p CABG X 4 (LIMA to LAD, SVG to diagonal, SVG to OM, SVG to acute marginal) + MVRepair on 09/06/23 -  Echo 02/2024: EF 30-35% MVR stable. Inferior wall AK. RV ok  - Stable NYHA Class II. Volume status mildly elevated, but has not taken her Lasix  yet today. *** - Continue Lasix  40 mg  daily *** - Continue carvedilol  to 12.5 mg BID *** - Continue Entresto  49/51 mg BID. Plan to increase next visit if appropriate. *** - Continue spironolactone  25 mg daily *** - Continue Farxiga  10 mg daily. Has some symptoms of a possible UTI that she will get assessed tomorrow. We discussed that Farxiga  usually causes yeast infections but can cause UTIs. Since she does not have frequent UTIs, benefit of Farxiga  outweighs risk in my opinion. She agrees and would like to continue the medication, but can reassess if these increase in frequency. *** - Will continue to titrate up meds and repeat echo scheduled for 12/05/24. If EF remains < 35% consider ICD ***   2. H/o Severe MR s/p MVRepair 08/2023 - appeared ischemic w/ posterior leaflet tethering on echo 08/2023. TEE 3-4+ MR - Now s/p MVR 09/06/23 at time of CABG 08/2023 - MVR stable on echo 02/2024   3. Severe MVCAD  - S/p CABG X 4 (LIMA to LAD, SVG to diagonal, SVG to OM, SVG to acute marginal) on 09/06/23 - stable w/o CP  - Continue aspirin  81 mg daily  - Continue rosuvastatin  40 mg daily    4. Post Op PAF/Atrial Tach  - Zio 02/2024 no AF - now off amiodarone  and Eliquis    5. Type 2DM  - Hgb A1c 6.4% - Follows with Dr. Elsie Sharps (Endo @ [facility])   6. Sternal Wound - resolved  Follow up ***  Morna Breach, PharmD, BCPS PGY2 Cardiology Pharmacy Resident  Tinnie Redman, PharmD, BCPS, BCCP, CPP Heart Failure Clinic Pharmacist [phone]

## 2024-11-21 ENCOUNTER — Ambulatory Visit (HOSPITAL_COMMUNITY)
Admission: RE | Admit: 2024-11-21 | Discharge: 2024-11-21 | Disposition: A | Source: Ambulatory Visit | Attending: Cardiology

## 2024-11-21 VITALS — BP 114/60 | HR 83 | Wt 206.0 lb

## 2024-11-21 DIAGNOSIS — I5022 Chronic systolic (congestive) heart failure: Secondary | ICD-10-CM | POA: Diagnosis not present

## 2024-11-21 MED ORDER — CARVEDILOL 25 MG PO TABS: 25.0000 mg | ORAL_TABLET | Freq: Two times a day (BID) | ORAL | 11 refills | Status: AC

## 2024-11-21 NOTE — Patient Instructions (Addendum)
 It was a pleasure seeing you today!  MEDICATIONS: -We are changing your medications today -Increase carvedilol  to 25 mg (1 tablet) twice daily. Can take carvedilol  2 of the 12.5 mg tablets until you run out.  -Call if you have questions about your medications.  LABS: -We will call you if your labs need attention.  NEXT APPOINTMENT: Return to clinic in 2 weeks with NP/PA-C and repeat ECHO.  In general, to take care of your heart failure: -Limit your fluid intake to 2 Liters (half-gallon) per day.   -Limit your salt intake to ideally 2-3 grams (2000-3000 mg) per day. -Weigh yourself daily and record, and bring that weight diary to your next appointment.  (Weight gain of 2-3 pounds in 1 day typically means fluid weight.) -The medications for your heart are to help your heart and help you live longer.   -Please contact us  before stopping any of your heart medications.  Call the clinic at [phone] with questions or to reschedule future appointments.

## 2024-11-21 NOTE — Progress Notes (Cosign Needed)
 "   Advanced Heart Failure Clinic Note   PCP: Barbra Odor, NP PCP-Cardiologist: Debby Sor, MD (Inactive)  AHFC: Dr. Cherrie   HPI:  79 y.o. female w/ h/o CAD s/p remote LAD and RCA PCI in 2011, Type 2DM and HLD, recently diagnosed w/ new systolic heart failure and severe MR by echo 08/2023. 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 Severe MR also confirmed by TEE. AHF team was consulted for HF optimization prior to CABG + MVR. She was optimized w/ milrinone  and diuresed w/ IV Lasix . Underwent CABG x4 (LIMA to LAD, SVG to diagonal, SVG to OM, SVG to acute marginal) + MVR by Dr. Kerrin. Weaned off pressors and transitioned to oral GDMT. Also developed PAF post op and placed on amio. Discharged home on 09/21/23.    Developed sternal wound infection.  She underwent incision and drainage and sternal wire removal and VAC placement on 12/29/2023. Site healed well.    Echo 02/2024 EF 30-35% MVR ok RV ok.    Zio placed 02/2024 to assess for afib. Monitor showed rare PACs and PVCs. No AF.    Today she returns to HF clinic for pharmacist medication titration. At last visit in pharmacy clinic, carvedilol  was increased to 12.5 mg BID. Overall, patient is doing well. Denies dizziness, lightheadedness, CP, or palpitations. Per patient report, BP ranges between 104-130/50-80 mmHg at home, consistent with BP in clinic today (114/60 mmHg, had not taken morning medications). Denies SOB/DOE, is able to walk up a flight of stairs to get into her home. Reports walking ~4000 steps/day at week. Checks weight daily at home, patient reports weight has remained stable at ~202 lbs. Patient has some trace bilateral LEE, but patient reports she has not taken her furosemide  yet. Patient aware she can take an extra dose of furosemide  as needed, but has not taken any extra doses since last visit. Denies PND/orthopnea. Patient states  her UTI and congestion have resolved and she is on the final day of doxycycline. Reports making changes in her diet, eating more salads. Reports adherence to all of her medications.   HF Medications: Carvedilol  12.5 mg BID Entresto  49/51 mg BID Spironolactone  25 mg daily Farxiga  10 mg daily Furosemide  40 mg daily  Has the patient been experiencing any side effects to the medications prescribed?  None reported by the patient.   Does the patient have any problems obtaining medications due to transportation or finances?   No, patient has [employer]; Has [employer].  Understanding of regimen: good Understanding of indications: good Potential of compliance: good Patient understands to avoid NSAIDs. Patient understands to avoid decongestants.    Pertinent Lab Values: (09/16/24) Serum creatinine 1.13 mg/dL, BUN 28 mg/dL, Potassium 4.4 mmol/L, Sodium 135 mmol/L, BNP 89.9 pg/mL  Vital Signs: Weight: 206 lbs (last clinic weight: 205.2 lbs) Blood pressure: 114/60 mmHg (last clinic BP: 134/64 mmHg) Heart rate: 83 bpm  Assessment/Plan: 1. Chronic Systolic Heart Failure  - Echo 2014 w/ normal EF 50-55%, normal RV - Echo 08/2023 EF 25-30%, severe MR, RV low normal  - Ischemic CM. LHC w/ severe MVCAD.  - RHC 08/2023 c/w decompensated heart failure, severe LV dysfunction, and severe mitral regurgitation (RA 21, PAP 83/37/56, PCWP 36, CO 4.11, CI 2.04) - S/p CABG X 4 (LIMA to LAD, SVG to diagonal, SVG to OM, SVG to acute marginal) + MVRepair on 09/06/23 - Echo 02/2024: EF 30-35% MVR stable. Inferior wall  AK. RV ok  - Stable NYHA Class II. Volume status mildly elevated, but has not taken her furosemide  yet today. - Continue furosemide  40 mg daily. Encouraged patient to take extra furosemide  20 mg PRN if she notices any signs of rapid weight gain or increased swelling in her legs.  - Increased carvedilol  to 25 mg BID. Encouraged patient to continue checking BP daily at home.  -  Continue Entresto  49/51 mg BID. - Continue spironolactone  25 mg daily - Continue Farxiga  10 mg daily. - Will continue to titrate up meds and repeat echo scheduled for 12/05/24. If EF remains < 35% consider ICD   2. H/o Severe MR s/p MVRepair 08/2023 - appeared ischemic w/ posterior leaflet tethering on echo 08/2023. TEE 3-4+ MR - Now s/p MVR 09/06/23 at time of CABG 08/2023 - MVR stable on echo 02/2024   3. Severe MVCAD  - S/p CABG X 4 (LIMA to LAD, SVG to diagonal, SVG to OM, SVG to acute marginal) on 09/06/23 - stable w/o CP  - Continue aspirin  81 mg daily  - Continue ezetimibe  10 mg daily   4. Post Op PAF/Atrial Tach  - Zio 02/2024 no AF - now off amiodarone  and Eliquis    5. Type 2DM  - Hgb A1c 6.4% - Follows with Dr. Elsie Sharps (Endo @ [facility])   6. Sternal Wound - resolved  Follow up in 2 weeks for repeat ECHO and visit with APP Clinic  Morna Breach, PharmD, BCPS PGY2 Cardiology Pharmacy Resident  Tinnie Redman, PharmD, BCPS, BCCP, CPP Heart Failure Clinic Pharmacist [phone]  "

## 2024-12-05 ENCOUNTER — Ambulatory Visit (HOSPITAL_COMMUNITY)
